# Patient Record
Sex: Male | Born: 1945
Health system: Southern US, Community
[De-identification: ages and names within clinical notes are randomized; demographics above are authoritative.]

## PROBLEM LIST (undated history)

## (undated) DIAGNOSIS — R350 Frequency of micturition: Secondary | ICD-10-CM

## (undated) DIAGNOSIS — M1990 Unspecified osteoarthritis, unspecified site: Secondary | ICD-10-CM

## (undated) DIAGNOSIS — R479 Unspecified speech disturbances: Secondary | ICD-10-CM

## (undated) DIAGNOSIS — R569 Unspecified convulsions: Secondary | ICD-10-CM

## (undated) DIAGNOSIS — C801 Malignant (primary) neoplasm, unspecified: Secondary | ICD-10-CM

## (undated) DIAGNOSIS — I10 Essential (primary) hypertension: Secondary | ICD-10-CM

## (undated) HISTORY — PX: BRAIN SURGERY: SHX531

## (undated) NOTE — *Deleted (*Deleted)
[facility] CONSULT NOTE  Patient Care Team: [name], MD as PCP - General (Family Medicine)  CHIEF COMPLAINTS/PURPOSE OF CONSULTATION:  ***  ASSESSMENT & PLAN:  No problem-specific Assessment & Plan notes found for this encounter.  No orders of the defined types were placed in this encounter.    HISTORY OF PRESENTING ILLNESS:   Joseph Rangel 41 y.o. male is here because of lymphadenopathy noted during CT angio abdomen. Patient had history of prostate cancer..... and has PAD and had some imaging recently which demonstrated scattered prominent irregular appearing intra pelvic,inguinal, and periaortic lymph nodes, the largest about the left iliac measuring approximately 1.7 cm in short axis.  He had radical prostatectomy in 2013, and this showed prostatic adenocarcinoma, Gleason 3+4, involving both lobes, no evidence of ALVI, extraprostatic extension or seminal vesicle involvement. All resection margins are clear, All LN negative. No evidence of malignancy in perivesical tissue.  REVIEW OF SYSTEMS:   Constitutional: Denies fevers, chills or abnormal night sweats Eyes: Denies blurriness of vision, double vision or watery eyes Ears, nose, mouth, throat, and face: Denies mucositis or sore throat Respiratory: Denies cough, dyspnea or wheezes Cardiovascular: Denies palpitation, chest discomfort or lower extremity swelling Gastrointestinal:  Denies nausea, heartburn or change in bowel habits Skin: Denies abnormal skin rashes Lymphatics: Denies new lymphadenopathy or easy bruising Neurological:Denies numbness, tingling or new weaknesses Behavioral/Psych: Mood is stable, no new changes  All other systems were reviewed with the patient and are negative.  MEDICAL HISTORY:  Past Medical History:  Diagnosis Date  . Arthritis   . Cancer    prostate   . Frequency of urination   . Hypertension   . Seizures    after brain surg - none in past 20 yrs  . 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 BRAIN SURGERY     BLOOD CLOT REMOVED 1980  . MASS EXCISION  02/24/2012   Procedure: MINOR EXCISION OF MASS;  Surgeon: Valetta Fuller, MD;  Location: [facility];  Service: Urology;;  excision of neck lesion  . ROBOT ASSISTED LAPAROSCOPIC RADICAL PROSTATECTOMY  02/24/2012   Procedure: ROBOTIC ASSISTED LAPAROSCOPIC RADICAL PROSTATECTOMY;  Surgeon: Valetta Fuller, MD;  Location: [facility];  Service: Urology;  Laterality: N/A;           SOCIAL HISTORY: Social History   Socioeconomic History  . Marital status: Married    Spouse name: Not on file  . Number of children: Not on file  . Years of education: Not on file  . Highest education level: Not on file  Occupational History  . Not on file  Tobacco Use  . Smoking status: Current Every Day Smoker    Packs/day: 0.50  Substance and Sexual Activity  . Alcohol use: No  . Drug use: No  . Sexual activity: Not on file  Other Topics Concern  . Not on file  Social History Narrative  . Not on file   Social Determinants of Health   Financial Resource Strain:   . Difficulty of Paying Living Expenses: Not on file  Food Insecurity:   . Worried About Programme researcher, broadcasting/film/video in the Last Year: Not on file  . Ran Out of Food in the Last Year: Not on file  Transportation Needs:   . Lack of Transportation (Medical): Not on file  . Lack of Transportation (Non-Medical): Not on file  Physical  Activity:   . Days of Exercise per Week: Not on file  . Minutes of Exercise per Session: Not on file  Stress:   . Feeling of Stress : Not on file  Social Connections:   . Frequency of Communication with Friends and Family: Not on file  . Frequency of Social Gatherings with Friends and Family: Not on file  . Attends Religious Services: Not on file  . Active Member of Clubs or Organizations: Not on file  . Attends Banker Meetings: Not on file  . Marital  Status: Not on file  Intimate Partner Violence:   . Fear of Current or Ex-Partner: Not on file  . Emotionally Abused: Not on file  . Physically Abused: Not on file  . Sexually Abused: Not on file    FAMILY HISTORY: No family history on file.  ALLERGIES:  has No Known Allergies.  MEDICATIONS:  Current Outpatient Medications  Medication Sig Dispense Refill  . phenytoin (DILANTIN) 100 MG ER capsule Take 100-200 mg by mouth 2 (two) times daily. Takes 2 every morning and 1 at bedtime    . verapamil (CALAN) 120 MG tablet Take 120 mg by mouth daily with breakfast.     No current facility-administered medications for this visit.     PHYSICAL EXAMINATION: ECOG PERFORMANCE STATUS: {CHL ONC ECOG [phone]}  There were no vitals filed for this visit. There were no vitals filed for this visit.  GENERAL:alert, no distress and comfortable SKIN: skin color, texture, turgor are normal, no rashes or significant lesions EYES: normal, conjunctiva are pink and non-injected, sclera clear OROPHARYNX:no exudate, no erythema and lips, buccal mucosa, and tongue normal  NECK: supple, thyroid normal size, non-tender, without nodularity LYMPH:  no palpable lymphadenopathy in the cervical, axillary or inguinal LUNGS: clear to auscultation and percussion with normal breathing effort HEART: regular rate & rhythm and no murmurs and no lower extremity edema ABDOMEN:abdomen soft, non-tender and normal bowel sounds Musculoskeletal:no cyanosis of digits and no clubbing  PSYCH: alert & oriented x 3 with fluent speech NEURO: no focal motor/sensory deficits  LABORATORY DATA:  I have reviewed the data as listed Lab Results  Component Value Date   WBC 9.0 02/18/2012   HGB 11.6 (L) 02/25/2012   HCT 34.9 (L) 02/25/2012   MCV 88.8 02/18/2012   PLT 224 02/18/2012     Chemistry      Component Value Date/Time   NA 132 (L) 02/25/2012 0447   K 3.7 02/25/2012 0447   CL 101 02/25/2012 0447   CO2 24  02/25/2012 0447   BUN 8 02/25/2012 0447   CREATININE 0.80 02/25/2012 0447      Component Value Date/Time   CALCIUM 7.9 (L) 02/25/2012 0447       RADIOGRAPHIC STUDIES: I have personally reviewed the radiological images as listed and agreed with the findings in the report. CT ANGIO AO+BIFEM W & OR WO CONTRAST  Result Date: 07/11/2020 CLINICAL DATA:  50 year old male with history of abnormal ankle brachial indices and left leg pain. EXAM: CT ANGIOGRAPHY OF ABDOMINAL AORTA WITH ILIOFEMORAL RUNOFF TECHNIQUE: Multidetector CT imaging of the abdomen, pelvis and lower extremities was performed using the standard protocol during bolus administration of intravenous contrast. Multiplanar CT image reconstructions and MIPs were obtained to evaluate the vascular anatomy. CONTRAST:  ISOVUE-370 IOPAMIDOL (ISOVUE-370) INJECTION 76% COMPARISON:  CT abdomen pelvis from 12/07/2013 FINDINGS: VASCULAR Aorta: Normal caliber and patent throughout. Scattered fibrofatty and calcific atherosclerotic changes, most prominent near the bifurcation without  evidence of flow limiting stenosis. Celiac: Patent without evidence of aneurysm, dissection, vasculitis or significant stenosis. SMA: Patent without evidence of aneurysm, dissection, vasculitis or significant stenosis. Of note there is a replaced right hepatic artery arising from the proximal SMA. Renals: Single bilateral renal arteries are patent without evidence of aneurysm, dissection, vasculitis, fibromuscular dysplasia or significant stenosis. IMA: Patent with mild proximal stenosis. RIGHT Lower Extremity Inflow: Common, internal and external iliac arteries are patent without evidence of aneurysm, dissection, vasculitis or significant stenosis. Scattered fibrofatty and calcific atherosclerotic changes. Outflow: Common, superficial and profunda femoral arteries and the popliteal artery are patent without evidence of aneurysm, dissection, vasculitis or significant  stenosis. Multifocal fibrofatty and calcific atherosclerotic changes with minimal multifocal luminal in right ocular disease, not likely to be hemodynamically significant. Runoff: The anterior tibial artery is patent the level of the foot. The TP trunk and posterior tibial artery are occluded throughout. The peroneal artery is diminutive but patent to the level of the ankle where there is a supra calcaneal collateral to the posterior tibial artery which then supplies the plantar arch. LEFT Lower Extremity Inflow: Common, internal and external iliac arteries are patent without evidence of aneurysm, dissection, vasculitis or significant stenosis. Scattered fibrofatty and calcific atherosclerotic changes Outflow: Common, superficial and profunda femoral arteries and the popliteal artery are patent without evidence of aneurysm, dissection, vasculitis or significant stenosis. Scattered fibrofatty and calcific atherosclerotic changes. Runoff: The anterior tibial artery is patent throughout. The TP trunk, proximal peroneal, and proximal posterior tibial arteries are diminutive proximally. The posterior tibial artery tapers to occlusion in the distal lower leg. The peroneal artery is patent to the level of the ankle. Veins: No obvious venous abnormality within the limitations of this arterial phase study. Review of the MIP images confirms the above findings. NON-VASCULAR Lower chest: The lung bases are clear bilaterally. The heart is normal in size. No pericardial effusion. Hepatobiliary: The liver is normal in size, contour, and attenuation. No focal lesions. The gallbladder is present and unremarkable. No intra or extrahepatic biliary ductal dilation. Pancreas: Unremarkable. No pancreatic ductal dilatation or surrounding inflammatory changes. Spleen: Normal in size without focal abnormality. Adrenals/Urinary Tract: Adrenal glands are unremarkable. Kidneys are normal, without renal calculi, focal lesion, or hydronephrosis.  Bladder is unremarkable. Stomach/Bowel: Stomach is within normal limits. Appendix appears normal. There are few scattered colonic diverticula. No evidence of bowel wall thickening, distention, or inflammatory changes. Lymphatic: Scattered prominent irregular appearing intra pelvic, inguinal, and periaortic lymph nodes, the largest about the left iliac measuring approximately 1.7 cm in short axis. Reproductive: Status post prostatectomy. Other: No abdominal wall hernia or abnormality. No abdominopelvic ascites. Musculoskeletal: Multilevel degenerative changes of the visualized thoracolumbar spine. IMPRESSION: VASCULAR 1. Two vessel bilateral lower extremity runoff configuration. Occlusion of the bilateral posterior tibial arteries with diminutive bilateral peroneal arteries. 2. Scattered fibrofatty and calcific atherosclerotic changes in the distal aorta, bilateral inflow, and bilateral outflow vessels without flow-limiting stenosis. Aortic Atherosclerosis (ICD10-I70.0). NON-VASCULAR Peri aortic, left iliac, and perirectal lymphadenopathy concerning for metastatic disease given history of prostate cancer. These results will be called to the ordering clinician or representative by the Radiologist Assistant, and communication documented in the PACS or [employer]. Marliss Coots, MD [facility] Electronically Signed   By: Marliss Coots MD   On: 07/11/2020 12:31    All questions were answered. The patient knows to call the clinic with any problems, questions or concerns. I spent {CHL ONC TIME VISIT -  ZOXWR:[id number]} counseling the patient face to face. The total time spent in the appointment was {CHL ONC TIME VISIT - BJYNW:[id number]} and more than 50% was on counseling.     Rachel Moulds, MD 07/31/2020 9:36 AM

---

## 1999-02-01 ENCOUNTER — Emergency Department (HOSPITAL_COMMUNITY): Admission: EM | Admit: 1999-02-01 | Discharge: 1999-02-01 | Payer: Self-pay | Admitting: Emergency Medicine

## 1999-02-02 ENCOUNTER — Encounter: Payer: Self-pay | Admitting: Emergency Medicine

## 1999-07-03 ENCOUNTER — Other Ambulatory Visit: Admission: RE | Admit: 1999-07-03 | Discharge: 1999-07-03 | Payer: Self-pay | Admitting: Family Medicine

## 1999-09-18 ENCOUNTER — Encounter (INDEPENDENT_AMBULATORY_CARE_PROVIDER_SITE_OTHER): Payer: Self-pay | Admitting: *Deleted

## 1999-09-18 ENCOUNTER — Ambulatory Visit (HOSPITAL_COMMUNITY): Admission: RE | Admit: 1999-09-18 | Discharge: 1999-09-18 | Payer: Self-pay | Admitting: *Deleted

## 2002-07-10 ENCOUNTER — Encounter (INDEPENDENT_AMBULATORY_CARE_PROVIDER_SITE_OTHER): Payer: Self-pay | Admitting: *Deleted

## 2002-07-10 ENCOUNTER — Ambulatory Visit (HOSPITAL_BASED_OUTPATIENT_CLINIC_OR_DEPARTMENT_OTHER): Admission: RE | Admit: 2002-07-10 | Discharge: 2002-07-10 | Payer: Self-pay | Admitting: General Surgery

## 2002-07-10 ENCOUNTER — Encounter: Admission: RE | Admit: 2002-07-10 | Discharge: 2002-07-10 | Payer: Self-pay | Admitting: General Surgery

## 2002-07-10 ENCOUNTER — Encounter: Payer: Self-pay | Admitting: General Surgery

## 2005-02-02 ENCOUNTER — Emergency Department (HOSPITAL_COMMUNITY): Admission: EM | Admit: 2005-02-02 | Discharge: 2005-02-02 | Payer: Self-pay | Admitting: Emergency Medicine

## 2009-07-31 ENCOUNTER — Encounter: Admission: RE | Admit: 2009-07-31 | Discharge: 2009-07-31 | Payer: Self-pay | Admitting: Family Medicine

## 2009-08-29 ENCOUNTER — Encounter: Admission: RE | Admit: 2009-08-29 | Discharge: 2009-08-29 | Payer: Self-pay | Admitting: Orthopedic Surgery

## 2009-09-03 ENCOUNTER — Emergency Department (HOSPITAL_COMMUNITY): Admission: EM | Admit: 2009-09-03 | Discharge: 2009-09-03 | Payer: Self-pay | Admitting: Emergency Medicine

## 2011-02-06 NOTE — Op Note (Signed)
Joseph Rangel, Joseph Rangel                            ACCOUNT NO.:  [id number]   MEDICAL RECORD NO.:  [id number]                   PATIENT TYPE:  AMB   LOCATION:  [facility]                                  FACILITY:  [facility]   PHYSICIAN:  Anselm Pancoast. Zachery Dakins, M.D.          DATE OF BIRTH:  02/19/1945   DATE OF PROCEDURE:  07/10/2002  DATE OF DISCHARGE:                                 OPERATIVE REPORT   PREOPERATIVE DIAGNOSIS:  Internal and external hemorrhoids, bleeding and  prolapsing.   OPERATION:  Internal and external hemorrhoidectomy, complex, three  quadrants.   SURGEON:  Anselm Pancoast. Zachery Dakins, M.D.   ANESTHESIA:  General anesthesia in the lithotomy position.   HISTORY:  The patient is a 65 year-old male who was referred by Dr. Lupe Carney to our office for bad problems with hemorrhoids.  He has had  prolapsing and bleeding for several years.  He had a colonoscopy about two  years ago with no other abnormalities noted with the exception of his  hemorrhoids.  He is disabled because of CNS trauma with a seizure disorder.  The patient on exam has large prolapsing hemorrhoids in both quadrants on  the right and large internal hemorrhoids on the left and I recommended that  a formal internal and external hemorrhoidectomy be performed.  He supposedly  had a bottle of magnesium citrate in prep even though after we had  anesthetized and placed him in the lithotomy position, it appears that he  still has firm stool within his rectum.   PROCEDURE:  I prepped the perianal area and the rectum with Betadine  solution and draped him in a sterile manner. The sphincter area was first  infiltrated all total of about 20 cc of 1/2% Marcaine with adrenalin.  Then  after he was draped in a sterile manner using the bullet retractor, the  largest hemorrhoid is the right posterolateral, the next is the left and  then there is a nearly as large up in the right anterior.  I elected to do  the right anterior  first elevating the hemorrhoid on the pedicle, exposing  the internal sphincter and then elevating this and under clamping the  internal portion with a Buie clamp.  The little secondary bleeders were  sutured with 3-0 chromic interrupted and then the pedicle.  I first sutured  under the clamp with three U stitches with a 2-0 chromic and then starting  at the highest area ligated the superior hemorrhoidal and then sutured with  a running continuous locking suture with the clamp removed. The second  hemorrhoid was done next; this was the right posterolateral.  The clamp  really did not clamp it completely and the superior pedicle slipped out, but  I sutured it basically opened directly with good hemostasis and then the  external portion of the hemorrhoid closed also in a similar manner with a 2-  0 chromic.  Next, the third lateral hemorrhoid, and it was actually two  hemorrhoids on a pedicle that I ligated both together.  I then switched from  the Buie to the little Sims retractor for exposure as the external portion  was closed.  In reinspected all suture lines with the small anoscope and  they all appeared to be hemostatic. Xylocaine ointment, gauze and 4 x 4  opened up was placed in the anal canal for postoperative pain and a little  packing.  This dressing which was three fourths of the sponges on the  outside was held in place with some 4 x 4's, an ABD with a little stretch  panty.  The patient tolerated the  procedure nicely, was awakened and sent  to the recovery room in a stable postoperative condition. He will be  released after a short stay in the recovery room with instructions not to do  anything strenuous for the next few days and Tylox for pain.  The estimated  blood loss was minimal.  The patient's hemoglobin was quite high, normal  preoperatively.                                              Anselm Pancoast. Zachery Dakins, M.D.   WJW/MEDQ  D:  07/10/2002  T:  07/10/2002  Job:   [id number]

## 2012-01-15 ENCOUNTER — Other Ambulatory Visit: Payer: Self-pay | Admitting: Urology

## 2012-01-15 DIAGNOSIS — C61 Malignant neoplasm of prostate: Secondary | ICD-10-CM

## 2012-01-26 ENCOUNTER — Other Ambulatory Visit: Payer: Self-pay | Admitting: Urology

## 2012-01-28 ENCOUNTER — Encounter (HOSPITAL_COMMUNITY)
Admission: RE | Admit: 2012-01-28 | Discharge: 2012-01-28 | Disposition: A | Discharge status: Home / Self Care | Payer: Medicare Other | Source: Ambulatory Visit | Attending: Urology | Admitting: Urology

## 2012-01-28 ENCOUNTER — Encounter (HOSPITAL_COMMUNITY): Payer: Self-pay

## 2012-01-28 DIAGNOSIS — C61 Malignant neoplasm of prostate: Secondary | ICD-10-CM | POA: Insufficient documentation

## 2012-01-28 MED ORDER — FLUDEOXYGLUCOSE F - 18 (FDG) INJECTION
17.4000 | Freq: Once | INTRAVENOUS | Status: AC | PRN
  Administered 2012-01-28: 17.4 via INTRAVENOUS

## 2012-02-12 ENCOUNTER — Encounter (HOSPITAL_COMMUNITY): Payer: Self-pay | Admitting: Pharmacy Technician

## 2012-02-18 ENCOUNTER — Ambulatory Visit (HOSPITAL_COMMUNITY)
Admission: RE | Admit: 2012-02-18 | Discharge: 2012-02-18 | Disposition: A | Discharge status: Home / Self Care | Payer: Medicare Other | Source: Ambulatory Visit | Attending: Urology | Admitting: Urology

## 2012-02-18 ENCOUNTER — Encounter (HOSPITAL_COMMUNITY)
Admission: RE | Admit: 2012-02-18 | Discharge: 2012-02-18 | Disposition: A | Discharge status: Home / Self Care | Payer: Medicare Other | Source: Ambulatory Visit | Attending: Urology | Admitting: Urology

## 2012-02-18 ENCOUNTER — Encounter (HOSPITAL_COMMUNITY): Payer: Self-pay

## 2012-02-18 DIAGNOSIS — Z01818 Encounter for other preprocedural examination: Secondary | ICD-10-CM | POA: Insufficient documentation

## 2012-02-18 DIAGNOSIS — C61 Malignant neoplasm of prostate: Secondary | ICD-10-CM | POA: Insufficient documentation

## 2012-02-18 DIAGNOSIS — Z01812 Encounter for preprocedural laboratory examination: Secondary | ICD-10-CM | POA: Insufficient documentation

## 2012-02-18 HISTORY — DX: Essential (primary) hypertension: I10

## 2012-02-18 HISTORY — DX: Frequency of micturition: R35.0

## 2012-02-18 HISTORY — DX: Malignant (primary) neoplasm, unspecified: C80.1

## 2012-02-18 HISTORY — DX: Unspecified speech disturbances: R47.9

## 2012-02-18 HISTORY — DX: Unspecified osteoarthritis, unspecified site: M19.90

## 2012-02-18 HISTORY — DX: Unspecified convulsions: R56.9

## 2012-02-18 LAB — CBC
MCH: 29.2 pg (ref 26.0–34.0)
MCV: 88.8 fL (ref 78.0–100.0)
Platelets: 224 10*3/uL (ref 150–400)
RBC: 4.55 MIL/uL (ref 4.22–5.81)
RDW: 14.7 % (ref 11.5–15.5)
WBC: 9 10*3/uL (ref 4.0–10.5)

## 2012-02-18 LAB — BASIC METABOLIC PANEL
Calcium: 9.4 mg/dL (ref 8.4–10.5)
Chloride: 104 mEq/L (ref 96–112)
Creatinine, Ser: 0.67 mg/dL (ref 0.50–1.35)
GFR calc Af Amer: >90 mL/min (ref >90)
Sodium: 139 mEq/L (ref 135–145)

## 2012-02-18 LAB — SURGICAL PCR SCREEN: MRSA, PCR: NEGATIVE

## 2012-02-18 NOTE — Patient Instructions (Signed)
20 Adilson CRUZE ZINGARO  02/18/2012   Your procedure is scheduled on:  02/24/12  Report to SHORT STAY DEPT  at 6:30 AM.  Call this number if you have problems the morning of surgery: [phone]   Remember:   Do not eat food or drink liquids AFTER MIDNIGHT  May have clear liquids UNTIL 6 HOURS BEFORE SURGERY  Clear liquids include soda, tea, black coffee, apple or grape juice, broth.  Take these medicines the morning of surgery with A SIP OF WATER: DILANTIN / VERAPAMIL   Do not wear jewelry, make-up or nail polish.  Do not wear lotions, powders, or perfumes.   Do not shave legs or underarms 48 hrs. before surgery (men may shave face)  Do not bring valuables to the hospital.  Contacts, dentures or bridgework may not be worn into surgery.  Leave suitcase in the car. After surgery it may be brought to your room.  For patients admitted to the hospital, checkout time is 11:00 AM the day of discharge.   Patients discharged the day of surgery will not be allowed to drive home.    Special Instructions:   Please read over the following fact sheets that you were given: MRSA  Information               SHOWER WITH BETASEPT THE NIGHT BEFORE SURGERY AND THE MORNING OF SURGERY               FOLLOW BOWEL PREP INSTRUCTION

## 2012-02-23 NOTE — H&P (Signed)
Joseph Rangel is an 67 y.o. male.   Chief Complaint: Prostate cancer. Patient is to undergo robotic-assisted laparoscopic radical retropubic prostatectomy with bilateral pelvic lymph node dissection. HPI: Joseph Rangel comes in today with his youngest daughter for an extended cancer conference. The patient has been diagnosed with intermediate risk clinical stage T1c adenocarcinoma of the prostate by Dr. Brunilda Rangel. The patient had an elevated PSA in the 7-8 range. His AUA symptom score was 18. No family history of any prostate cancer. His prostate was felt to be 2+ in size without any worrisome nodules or induration. Ultrasound revealed a 43 gram prostate. On the right side 1 out 6 biopsies was positive for a Gleason 6 cancer. On the left the patient had 6 out of 6 cores positive. Primarily a Gleason 3+4=7 cancer with core involvement of 5-80%. A bone scan has been ordered but is pending at this time. The patient's preoperative SHIM score is 4. He has had a discussion with Dr. Brunilda Rangel and is leaning towards robotic surgery. He is here to discuss that procedure in detail. He has had no intraabdominal surgery. The patient does have a tobacco use history of approximately 25-30 pack years. His exercise tolerance appears relatively good. He has had no chest pain or significant shortness of breath issues. No known coronary disease.   Past Medical History  Diagnosis Date  . Hypertension   . Seizures     after brain surg - none in past 20 yrs  . 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 Arthritis   . Cancer     prostate   . Frequency of urination     Past Surgical History  Procedure Date  . Brain surgery     BLOOD CLOT REMOVED 1980    No family history on file. Social History:  reports that he has been smoking.  He does not have any smokeless tobacco history on file. He reports that he does not drink alcohol or use illicit drugs.  Allergies: No Known Allergies  No prescriptions prior to admission    No  results found for this or any previous visit (from the past 48 hour(s)). No results found.  Review of Systems - Negative except Urinary frequency with mild urgency, erectile dysfunction, back and joint pain, dizziness.  There were no vitals taken for this visit. General appearance: alert, cooperative and no distress Neck: no adenopathy and no JVD Resp: clear to auscultation bilaterally Cardio: regular rate and rhythm GI: soft, non-tender; bowel sounds normal; no masses,  no organomegaly Male genitalia: normal, penis: no lesions or discharge. testes: no masses or tenderness. no hernias Pelvic: 1/2+ prostate without gross nodules. Extremities: extremities normal, atraumatic, no cyanosis or edema Skin: Skin color, texture, turgor normal. No rashes or lesions Neurologic: Grossly normal  Assessment/Plan The patient was counseled about the natural history of prostate cancer and the standard treatment options that are available for prostate cancer. It was explained to him how his age and life expectancy, clinical stage, Gleason score, and PSA affect his prognosis, the decision to proceed with additional staging studies, as well as how that information influences recommended treatment strategies. We discussed the roles for active surveillance, radiation therapy, surgical therapy, androgen deprivation, as well as ablative therapy options for the treatment of prostate cancer as appropriate to his individual cancer situation. We discussed the risks and benefits of these options with regard to their impact on cancer control and also in terms of potential adverse  events, complications, and impact on quiality of life particularly related to urinary, bowel, and sexual function. The patient was encouraged to ask questions throughout the discussion today and all questions were answered to his stated satisfaction. In addition, the patient was provided with and/or directed to appropriate resources and literature for  further education about prostate cancer and treatment options.   We discussed surgical therapy for prostate cancer including the different available surgical approaches. We discussed, in detail, the risks and expectations of surgery with regard to cancer control, urinary control, and erectile function as well as the expected postoperative recovery process. The risks, potential complications/adverse events of radical prostatectomy as well as alternative options were explained to the patient.   We discussed surgical therapy for prostate cancer including the different available surgical approaches. We discussed, in detail, the risks and expectations of surgery with regard to cancer control, urinary control, and erectile function as well as the expected postoperative recovery process. Additional risks of surgery including but not limited to bleeding, infection, hernia formation, nerve damage, lymphocele formation, bowel/rectal injury potentially necessitating colostomy, damage to the urinary tract resulting in urine leakage, urethral stricture, and the cardiopulmonary risks such as myocardial infarction, stroke, death, venothromboembolism, etc. were explained. The risk of open surgical conversion for robotic/laparoscopic prostatectomy was also discussed.     Joseph Rangel S 02/23/2012, 9:00 AM

## 2012-02-24 ENCOUNTER — Ambulatory Visit (HOSPITAL_COMMUNITY): Payer: Medicare Other | Admitting: Anesthesiology

## 2012-02-24 ENCOUNTER — Encounter (HOSPITAL_COMMUNITY): Payer: Self-pay | Admitting: Anesthesiology

## 2012-02-24 ENCOUNTER — Inpatient Hospital Stay (HOSPITAL_COMMUNITY)
Admission: RE | Admit: 2012-02-24 | Discharge: 2012-02-25 | DRG: 708 | Disposition: A | Discharge status: Home / Self Care | Payer: Medicare Other | Source: Ambulatory Visit | Attending: Urology | Admitting: Urology

## 2012-02-24 ENCOUNTER — Encounter (HOSPITAL_COMMUNITY)
Admission: RE | Disposition: A | Discharge status: Home / Self Care | Payer: Self-pay | Source: Ambulatory Visit | Attending: Urology

## 2012-02-24 ENCOUNTER — Encounter (HOSPITAL_COMMUNITY): Payer: Self-pay | Admitting: *Deleted

## 2012-02-24 DIAGNOSIS — I10 Essential (primary) hypertension: Secondary | ICD-10-CM | POA: Diagnosis present

## 2012-02-24 DIAGNOSIS — C61 Malignant neoplasm of prostate: Principal | ICD-10-CM | POA: Diagnosis present

## 2012-02-24 DIAGNOSIS — F17200 Nicotine dependence, unspecified, uncomplicated: Secondary | ICD-10-CM | POA: Diagnosis present

## 2012-02-24 DIAGNOSIS — M129 Arthropathy, unspecified: Secondary | ICD-10-CM | POA: Diagnosis present

## 2012-02-24 HISTORY — PX: MASS EXCISION: SHX2000

## 2012-02-24 HISTORY — PX: ROBOT ASSISTED LAPAROSCOPIC RADICAL PROSTATECTOMY: SHX5141

## 2012-02-24 LAB — BASIC METABOLIC PANEL
BUN: 10 mg/dL (ref 6–23)
CO2: 24 mEq/L (ref 19–32)
Calcium: 8.2 mg/dL — ABNORMAL LOW (ref 8.4–10.5)
Glucose, Bld: 148 mg/dL — ABNORMAL HIGH (ref 70–99)
Sodium: 134 mEq/L — ABNORMAL LOW (ref 135–145)

## 2012-02-24 LAB — HEMOGLOBIN AND HEMATOCRIT, BLOOD: HCT: 37.5 % — ABNORMAL LOW (ref 39.0–52.0)

## 2012-02-24 LAB — ABO/RH: ABO/RH(D): O POS

## 2012-02-24 LAB — TYPE AND SCREEN

## 2012-02-24 SURGERY — ROBOTIC ASSISTED LAPAROSCOPIC RADICAL PROSTATECTOMY
Anesthesia: General | Site: Pelvis | Wound class: Clean Contaminated

## 2012-02-24 MED ORDER — CIPROFLOXACIN HCL 500 MG PO TABS: 500.0000 mg | ORAL_TABLET | Freq: Two times a day (BID) | ORAL | Status: AC

## 2012-02-24 MED ORDER — ACETAMINOPHEN 10 MG/ML IV SOLN
1000.0000 mg | Freq: Four times a day (QID) | INTRAVENOUS | Status: AC
  Administered 2012-02-24 – 2012-02-25 (×4): 1000 mg via INTRAVENOUS
  Filled 2012-02-24 (×4): qty 100

## 2012-02-24 MED ORDER — VERAPAMIL HCL 120 MG PO TABS
120.0000 mg | ORAL_TABLET | Freq: Every day | ORAL | Status: DC
  Administered 2012-02-25: 120 mg via ORAL
  Filled 2012-02-24: qty 1

## 2012-02-24 MED ORDER — SODIUM CHLORIDE 0.9 % IR SOLN
Status: DC | PRN
  Administered 2012-02-24: 200 mL

## 2012-02-24 MED ORDER — NEOSTIGMINE METHYLSULFATE 1 MG/ML IJ SOLN
INTRAMUSCULAR | Status: DC | PRN
  Administered 2012-02-24: 3 mg via INTRAVENOUS

## 2012-02-24 MED ORDER — PHENYTOIN SODIUM EXTENDED 100 MG PO CAPS: 100.0000 mg | ORAL_CAPSULE | Freq: Two times a day (BID) | ORAL | Status: DC

## 2012-02-24 MED ORDER — BACITRACIN ZINC 500 UNIT/GM EX OINT
TOPICAL_OINTMENT | CUTANEOUS | Status: AC
  Filled 2012-02-24: qty 15

## 2012-02-24 MED ORDER — CEFAZOLIN SODIUM 1-5 GM-% IV SOLN
INTRAVENOUS | Status: AC
  Filled 2012-02-24: qty 50

## 2012-02-24 MED ORDER — CEFAZOLIN SODIUM 1-5 GM-% IV SOLN
1.0000 g | Freq: Three times a day (TID) | INTRAVENOUS | Status: AC
  Administered 2012-02-24 – 2012-02-25 (×2): 1 g via INTRAVENOUS
  Filled 2012-02-24 (×2): qty 50

## 2012-02-24 MED ORDER — PROPOFOL 10 MG/ML IV EMUL
INTRAVENOUS | Status: DC | PRN
  Administered 2012-02-24: 150 mg via INTRAVENOUS

## 2012-02-24 MED ORDER — ONDANSETRON HCL 4 MG/2ML IJ SOLN
INTRAMUSCULAR | Status: DC | PRN
  Administered 2012-02-24: 4 mg via INTRAVENOUS

## 2012-02-24 MED ORDER — LACTATED RINGERS IV SOLN
INTRAVENOUS | Status: DC | PRN
  Administered 2012-02-24: 10:00:00

## 2012-02-24 MED ORDER — PHENYTOIN SODIUM EXTENDED 100 MG PO CAPS
100.0000 mg | ORAL_CAPSULE | Freq: Every day | ORAL | Status: DC
  Administered 2012-02-24: 100 mg via ORAL
  Filled 2012-02-24 (×2): qty 1

## 2012-02-24 MED ORDER — KCL IN DEXTROSE-NACL 10-5-0.45 MEQ/L-%-% IV SOLN
INTRAVENOUS | Status: AC
  Filled 2012-02-24: qty 1000

## 2012-02-24 MED ORDER — CEFAZOLIN SODIUM 1-5 GM-% IV SOLN
1.0000 g | INTRAVENOUS | Status: AC
  Administered 2012-02-24: 1 g via INTRAVENOUS

## 2012-02-24 MED ORDER — ROCURONIUM BROMIDE 100 MG/10ML IV SOLN
INTRAVENOUS | Status: DC | PRN
  Administered 2012-02-24: 10 mg via INTRAVENOUS
  Administered 2012-02-24: 20 mg via INTRAVENOUS
  Administered 2012-02-24: 50 mg via INTRAVENOUS
  Administered 2012-02-24 (×2): 10 mg via INTRAVENOUS
  Administered 2012-02-24: 20 mg via INTRAVENOUS
  Administered 2012-02-24: 10 mg via INTRAVENOUS
  Administered 2012-02-24: 20 mg via INTRAVENOUS

## 2012-02-24 MED ORDER — BACITRACIN ZINC 500 UNIT/GM EX OINT
TOPICAL_OINTMENT | CUTANEOUS | Status: DC | PRN
  Administered 2012-02-24: 1 via TOPICAL

## 2012-02-24 MED ORDER — HEMOSTATIC AGENTS (NO CHARGE) OPTIME
TOPICAL | Status: DC | PRN
  Administered 2012-02-24: 1 via TOPICAL

## 2012-02-24 MED ORDER — LABETALOL HCL 5 MG/ML IV SOLN
5.0000 mg | INTRAVENOUS | Status: DC | PRN
Start: 2012-02-24 — End: 2012-02-24
  Administered 2012-02-24 (×2): 5 mg via INTRAVENOUS

## 2012-02-24 MED ORDER — HYDROCODONE-ACETAMINOPHEN 5-325 MG PO TABS: 1.0000 | ORAL_TABLET | ORAL | Status: DC | PRN

## 2012-02-24 MED ORDER — ACETAMINOPHEN 10 MG/ML IV SOLN
INTRAVENOUS | Status: DC | PRN
  Administered 2012-02-24: 1000 mg via INTRAVENOUS

## 2012-02-24 MED ORDER — BUPIVACAINE-EPINEPHRINE 0.25% -1:200000 IJ SOLN
INTRAMUSCULAR | Status: DC | PRN
  Administered 2012-02-24: 27 mL

## 2012-02-24 MED ORDER — HYDROMORPHONE HCL PF 1 MG/ML IJ SOLN
INTRAMUSCULAR | Status: DC | PRN
  Administered 2012-02-24 (×2): 1 mg via INTRAVENOUS

## 2012-02-24 MED ORDER — LACTATED RINGERS IV SOLN
INTRAVENOUS | Status: DC | PRN
  Administered 2012-02-24 (×3): via INTRAVENOUS

## 2012-02-24 MED ORDER — KCL IN DEXTROSE-NACL 10-5-0.45 MEQ/L-%-% IV SOLN
INTRAVENOUS | Status: DC
  Administered 2012-02-24: 21:00:00 via INTRAVENOUS
  Administered 2012-02-24: 1000 mL via INTRAVENOUS
  Administered 2012-02-25: 07:00:00 via INTRAVENOUS
  Filled 2012-02-24 (×5): qty 1000

## 2012-02-24 MED ORDER — HYDROMORPHONE HCL PF 1 MG/ML IJ SOLN
INTRAMUSCULAR | Status: AC
  Filled 2012-02-24: qty 1

## 2012-02-24 MED ORDER — INDIGOTINDISULFONATE SODIUM 8 MG/ML IJ SOLN
INTRAMUSCULAR | Status: DC | PRN
  Administered 2012-02-24 (×2): 5 mL via INTRAVENOUS

## 2012-02-24 MED ORDER — FENTANYL CITRATE 0.05 MG/ML IJ SOLN
INTRAMUSCULAR | Status: DC | PRN
  Administered 2012-02-24 (×2): 100 ug via INTRAVENOUS
  Administered 2012-02-24: 50 ug via INTRAVENOUS
  Administered 2012-02-24: 100 ug via INTRAVENOUS

## 2012-02-24 MED ORDER — MIDAZOLAM HCL 5 MG/5ML IJ SOLN
INTRAMUSCULAR | Status: DC | PRN
  Administered 2012-02-24: 2 mg via INTRAVENOUS

## 2012-02-24 MED ORDER — METOPROLOL TARTRATE 1 MG/ML IV SOLN
5.0000 mg | Freq: Once | INTRAVENOUS | Status: AC
  Administered 2012-02-24: 5 mg via INTRAVENOUS
  Filled 2012-02-24: qty 5

## 2012-02-24 MED ORDER — INDIGOTINDISULFONATE SODIUM 8 MG/ML IJ SOLN
INTRAMUSCULAR | Status: AC
  Filled 2012-02-24: qty 10

## 2012-02-24 MED ORDER — HYDROCODONE-ACETAMINOPHEN 5-325 MG PO TABS: 1.0000 | ORAL_TABLET | Freq: Four times a day (QID) | ORAL | Status: AC | PRN

## 2012-02-24 MED ORDER — HYDROMORPHONE HCL PF 1 MG/ML IJ SOLN
0.2500 mg | INTRAMUSCULAR | Status: DC | PRN
  Administered 2012-02-24 (×4): 0.5 mg via INTRAVENOUS

## 2012-02-24 MED ORDER — MORPHINE SULFATE 2 MG/ML IJ SOLN
2.0000 mg | INTRAMUSCULAR | Status: DC | PRN
  Administered 2012-02-24 (×3): 2 mg via INTRAVENOUS
  Filled 2012-02-24 (×3): qty 1

## 2012-02-24 MED ORDER — BUPIVACAINE-EPINEPHRINE 0.25% -1:200000 IJ SOLN
INTRAMUSCULAR | Status: AC
  Filled 2012-02-24: qty 1

## 2012-02-24 MED ORDER — GLYCOPYRROLATE 0.2 MG/ML IJ SOLN
INTRAMUSCULAR | Status: DC | PRN
  Administered 2012-02-24: 0.4 mg via INTRAVENOUS

## 2012-02-24 MED ORDER — HEPARIN SODIUM (PORCINE) 1000 UNIT/ML IJ SOLN
INTRAMUSCULAR | Status: AC
  Filled 2012-02-24: qty 1

## 2012-02-24 MED ORDER — PNEUMOCOCCAL VAC POLYVALENT 25 MCG/0.5ML IJ INJ
0.5000 mL | INJECTION | INTRAMUSCULAR | Status: AC
  Administered 2012-02-25: 0.5 mL via INTRAMUSCULAR
  Filled 2012-02-24: qty 0.5

## 2012-02-24 MED ORDER — SODIUM CHLORIDE 0.9 % IV BOLUS (SEPSIS)
1000.0000 mL | Freq: Once | INTRAVENOUS | Status: AC
  Administered 2012-02-24: 1000 mL via INTRAVENOUS

## 2012-02-24 MED ORDER — LACTATED RINGERS IV SOLN: INTRAVENOUS | Status: DC

## 2012-02-24 MED ORDER — PHENYLEPHRINE HCL 10 MG/ML IJ SOLN
INTRAMUSCULAR | Status: DC | PRN
  Administered 2012-02-24 (×2): 100 ug via INTRAVENOUS
  Administered 2012-02-24 (×2): 50 ug via INTRAVENOUS

## 2012-02-24 MED ORDER — LIDOCAINE HCL (CARDIAC) 20 MG/ML IV SOLN
INTRAVENOUS | Status: DC | PRN
  Administered 2012-02-24: 60 mg via INTRAVENOUS

## 2012-02-24 MED ORDER — ACETAMINOPHEN 10 MG/ML IV SOLN
INTRAVENOUS | Status: AC
  Filled 2012-02-24: qty 100

## 2012-02-24 MED ORDER — PHENYTOIN SODIUM EXTENDED 100 MG PO CAPS
200.0000 mg | ORAL_CAPSULE | Freq: Every day | ORAL | Status: DC
  Administered 2012-02-25: 200 mg via ORAL
  Filled 2012-02-24: qty 2

## 2012-02-24 MED ORDER — LABETALOL HCL 5 MG/ML IV SOLN
INTRAVENOUS | Status: AC
  Filled 2012-02-24: qty 4

## 2012-02-24 SURGICAL SUPPLY — 52 items
APL ESCP 34 STRL LF DISP (HEMOSTASIS) ×3
APPLICATOR SURGIFLO ENDO (HEMOSTASIS) ×1 IMPLANT
CANISTER SUCTION 2500CC (MISCELLANEOUS) ×4 IMPLANT
CANNULA SEAL DVNC (CANNULA) IMPLANT
CANNULA SEALS DA VINCI (CANNULA) ×3
CATH FOLEY 2WAY SLVR  5CC 20FR (CATHETERS) ×1
CATH FOLEY 2WAY SLVR 5CC 20FR (CATHETERS) ×3 IMPLANT
CATH ROBINSON RED A/P 8FR (CATHETERS) ×4 IMPLANT
CHLORAPREP W/TINT 26ML (MISCELLANEOUS) ×4 IMPLANT
CLIP LIGATING HEM O LOK PURPLE (MISCELLANEOUS) ×16 IMPLANT
CLOTH BEACON ORANGE TIMEOUT ST (SAFETY) ×4 IMPLANT
CORD HIGH FREQUENCY UNIPOLAR (ELECTROSURGICAL) ×4 IMPLANT
CORDS BIPOLAR (ELECTRODE) ×1 IMPLANT
COVER SURGICAL LIGHT HANDLE (MISCELLANEOUS) ×4 IMPLANT
COVER TIP SHEARS 8 DVNC (MISCELLANEOUS) ×3 IMPLANT
COVER TIP SHEARS 8MM DA VINCI (MISCELLANEOUS) ×1
CUTTER ECHEON FLEX ENDO 45 340 (ENDOMECHANICALS) ×4 IMPLANT
DECANTER SPIKE VIAL GLASS SM (MISCELLANEOUS) ×4 IMPLANT
DRAPE CAMERA ARM DAVINCI SIROB (DRAPES) ×1 IMPLANT
DRAPE CAMERA HEAD DAVINCI SI (DRAPES) ×1 IMPLANT
DRAPE INSTRUMENT ARM DA VINCI (DRAPES) ×1
DRAPE INSTRUMENT ARM DVNC (DRAPES) IMPLANT
DRAPE SURG IRRIG POUCH 19X23 (DRAPES) ×4 IMPLANT
DRAPE UTILITY 15X26 (DRAPE) ×3 IMPLANT
DRSG TEGADERM 6X8 (GAUZE/BANDAGES/DRESSINGS) ×11 IMPLANT
ELECT REM PT RETURN 9FT ADLT (ELECTROSURGICAL) ×4
ELECTRODE REM PT RTRN 9FT ADLT (ELECTROSURGICAL) ×3 IMPLANT
FLOSEAL 10ML (HEMOSTASIS) ×1 IMPLANT
GLOVE BIO SURGEON STRL SZ 6.5 (GLOVE) ×8 IMPLANT
GLOVE BIOGEL M STRL SZ7.5 (GLOVE) ×4 IMPLANT
GOWN PREVENTION PLUS XLARGE (GOWN DISPOSABLE) ×4 IMPLANT
GOWN STRL NON-REIN LRG LVL3 (GOWN DISPOSABLE) ×5 IMPLANT
GOWN STRL REIN XL XLG (GOWN DISPOSABLE) ×4 IMPLANT
HEMOSTAT SURGICEL 4X8 (HEMOSTASIS) ×1 IMPLANT
HOLDER FOLEY CATH W/STRAP (MISCELLANEOUS) ×4 IMPLANT
IV LACTATED RINGERS 1000ML (IV SOLUTION) ×4 IMPLANT
KIT ACCESSORY DA VINCI DISP (KITS)
KIT ACCESSORY DVNC DISP (KITS) ×3 IMPLANT
NDL SAFETY ECLIPSE 18X1.5 (NEEDLE) ×3 IMPLANT
NEEDLE HYPO 18GX1.5 SHARP (NEEDLE) ×4
PACK ROBOT UROLOGY CUSTOM (CUSTOM PROCEDURE TRAY) ×4 IMPLANT
RELOAD GREEN ECHELON 45 (STAPLE) ×4 IMPLANT
SEALER TISSUE G2 CVD JAW 45CM (ENDOMECHANICALS) IMPLANT
SET TUBE IRRIG SUCTION NO TIP (IRRIGATION / IRRIGATOR) ×4 IMPLANT
SOLUTION ELECTROLUBE (MISCELLANEOUS) ×4 IMPLANT
SPONGE GAUZE 4X4 12PLY (GAUZE/BANDAGES/DRESSINGS) ×4 IMPLANT
SUT VIC AB 2-0 SH 27 (SUTURE) ×4
SUT VIC AB 2-0 SH 27X BRD (SUTURE) ×3 IMPLANT
SUT VICRYL 0 UR6 27IN ABS (SUTURE) ×4 IMPLANT
SYR 27GX1/2 1ML LL SAFETY (SYRINGE) ×4 IMPLANT
TOWEL OR NON WOVEN STRL DISP B (DISPOSABLE) ×4 IMPLANT
WATER STERILE IRR 1500ML POUR (IV SOLUTION) ×8 IMPLANT

## 2012-02-24 NOTE — Transfer of Care (Signed)
Immediate Anesthesia Transfer of Care Note  Patient: Joseph Rangel  Procedure(s) Performed: Procedure(s) (LRB): ROBOTIC ASSISTED LAPAROSCOPIC RADICAL PROSTATECTOMY (N/A) LYMPHADENECTOMY (Bilateral) MINOR EXCISION OF MASS ()  Patient Location: PACU  Anesthesia Type: General  Level of Consciousness: awake, alert , oriented and patient cooperative  Airway & Oxygen Therapy: Patient Spontanous Breathing and Patient connected to face mask oxygen  Post-op Assessment: Report given to PACU RN, Post -op Vital signs reviewed and stable and Patient moving all extremities X 4  Post vital signs: Reviewed and stable  Complications: No apparent anesthesia complications

## 2012-02-24 NOTE — Progress Notes (Signed)
Bowel prep done by pt as instructed

## 2012-02-24 NOTE — Op Note (Signed)
Preoperative diagnosis: Clinical stage T1c Adenocarcinoma prostate  Postoperative diagnosis: Same  Procedure: Robotic-assisted laparoscopic radical retropubic prostatectomy with bilateral pelvic lymph node dissection  Surgeon: Valetta Fuller, MD  Asst.: Pecola Leisure, PA Anesthesia: Gen. Endotracheal  Indications: Patient was diagnosed with clinical stage TIc Adenocarcinoma the prostate. He underwent extensive consultation with regard to treatment options. The patient decided on a surgical approach. He appeared to understand the distinct advantages as well as the disadvantages of this procedure. The patient has performed a mechanical bowel prep. He has had placement of PAS compression boots and has received perioperative antibiotics. The patient's preoperative PSA was 7.6. Ultrasound revealed a 43 g prostate.   Technique and findings:The patient was brought to the operating room and had successful induction of general endotracheal anesthesia.the patient was placed in a low lithotomy position with careful padding of all extremities. He was secured to the operative table and placed in the steep Trendelenburg position. He was prepped and draped in usual manner. A Foley catheter was placed sterilely on the field. Camera port site was chosen 18 cm above the pubic symphysis just to the left of the umbilicus. A standard open Hassan technique was utilized. A 12 mm trocar was placed without difficulty. The camera was then inserted and no abnormalities were noted within the pelvis. The trochars were placed with direct visual guidance. This included 3 8mm robotic trochars and a 12 mm and 5 mm assist ports. Once all the ports were placed the robot was docked. The bladder was filled and the space of Retzius was developed with electrocautery dissection as well as blunt dissection. Superficial fat off the endopelvic fascia and bladder neck was removed with electrocautery scissors. The endopelvic fascia was then incised  bilaterally from base to apex. Levator musculature was swept off the apex of the prostate isolating the dorsal venous complex which was then stapled with the ETS stapling device. The anterior bladder neck was identified with the aid of the Foley balloon. This was then transected down to the Foley catheter with electrocautery scissors. The Foley catheter was then retracted anteriorly. Indigo carmine was given and we appeared to be well away from the ureteral orifices. The posterior bladder neck was then transected and the dissection carried down to the adnexal structures. The seminal vesicles and vas deferens were then identified posteriorly. There really no good normal tissue planes in this area. The seminal vesicles and vas appeared to be encased in either an inflammatory or potentially malignant process. In order to more safely dissect out the structures I went posteriorly behind the bladder and approached the structures in the posterior manner. We were able to free up the structures which were then retracted anteriorly . The posterior plane between the rectum and prostate was then established primarily with blunt dissection.  Attention was then turned towards nerve sparing. The patient was felt to be a candidate for right-sided nerve sparing only. Superficial fascia along the anterior lateral aspect of the prostate was incised on the right. This tissue was then swept laterally until we were able to establish a groove between the neurovascular tissue and the posterior lateral aspect on the prostate. This groove was then extended from the apex back to the base of the prostate. With the prostate retracted anteriorly the vascular pedicles of the prostate were taken with the Enseal device. Left sided pedicles and NV bundles taken widely. The Foley catheter was then reinserted and the anterior urethra was transected. The posterior urethra was then transected as  were some rectourethralis fibers. The prostate was then  removed from the pelvis. The pelvis was then copiously irrigated. Rectal insufflation was performed and there was no evidence of rectal injury.  Attention was then turned towards bilateral pelvic lymph node dissection. The obturator node packets were removed I laterally and the dissection extended towards the bifurcation of the iliac artery. The obturator nerve was identified on both sides and preserved. Hemalock clips were used for small veins and lymphatic channels. The node packets were sent for permanent analysis.  Attention was then turned towards reconstruction. The bladder neck did not require any reconstruction. The bladder neck and posterior urethra were reapproximated at the 6:00 position utilizing a 2-0 Vicryl suture. The rest of the anastomosis was done with a double-armed 3-0 Monocryl suture in a 360 degree manner. Additional indigo carmine was given. A new catheter was placed and bladder irrigation revealed no evidence of leakage. A Blake drain was placed through one of the robotic trochars and positioned in the retropubic space above the anastomosis. This was then secured to the skin with a nylon suture. The prostate was placed in the Endopouch retrieval bag. The 12 mm trocar site was closed with a Vicryl suture with the aid of a suture passer. Our other trochars were taken out with direct visual guidance without evidence of any bleeding. The camera port incision was extended slightly to allow for removal of the specimen and then closed with a running Vicryl suture. All port sites were infiltrated with Marcaine and then closed with surgical clips. The patient was then taken to recovery room having had no obvious complications or problems. Sponge and needle counts were correct.

## 2012-02-24 NOTE — Preoperative (Signed)
Beta Blockers   Reason not to administer Beta Blockers:Not Applicable 

## 2012-02-24 NOTE — Anesthesia Postprocedure Evaluation (Signed)
  Anesthesia Post-op Note  Patient: Joseph Rangel  Procedure(s) Performed: Procedure(s) (LRB): ROBOTIC ASSISTED LAPAROSCOPIC RADICAL PROSTATECTOMY (N/A) LYMPHADENECTOMY (Bilateral) MINOR EXCISION OF MASS ()  Patient Location: PACU  Anesthesia Type: General  Level of Consciousness: awake and alert   Airway and Oxygen Therapy: Patient Spontanous Breathing  Post-op Pain: mild  Post-op Assessment: Post-op Vital signs reviewed, Patient's Cardiovascular Status Stable, Respiratory Function Stable, Patent Airway and No signs of Nausea or vomiting  Post-op Vital Signs: stable  Complications: No apparent anesthesia complications

## 2012-02-24 NOTE — Discharge Instructions (Signed)
1. Activity:  You are encouraged to ambulate frequently (about every hour during waking hours) to help prevent blood clots from forming in your legs or lungs.  However, you should not engage in any heavy lifting (> 10-15 lbs), strenuous activity, or straining. °2. Diet: You should continue a clear liquid diet until passing gas from below.  Once this occurs, you may advance your diet to a soft diet that would be easy to digest (i.e soups, scrambled eggs, mashed potatoes, etc.) for 24 hours just as you would if getting over a bad stomach flu.  If tolerating this diet well for 24 hours, you may then begin eating regular food.  It will be normal to have some amount of bloating, nausea, and abdominal discomfort intermittently. °3. Prescriptions:  You will be provided a prescription for pain medication to take as needed.  If your pain is not severe enough to require the prescription pain medication, you may take extra strength Tylenol instead.  You should also take an over the counter stool softener (Colace 100 mg twice daily) to avoid straining with bowel movements as the pain medication may constipate you. Finally, you will also be provided a prescription for an antibiotic to begin the day prior to your return visit in the office for catheter removal. °4. Catheter care: You will be taught how to take care of the catheter by the nursing staff prior to discharge from the hospital.  You may use both a leg bag and the larger bedside bag but it is recommended to at least use the bigger bedside bag at nighttime as the leg bag is small and will fill up overnight and also does not drain as well when lying flat. You may periodically feel a strong urge to void with the catheter in place.  This is a bladder spasm and most often can occur when having a bowel movement or when you are moving around. It is typically self-limited and usually will stop after a few minutes.  You may use some Vaseline or Neosporin around the tip of the  catheter to reduce friction at the tip of the penis. °5. Incisions: You may remove your dressing bandages the 2nd day after surgery.  You most likely will have a few small staples in each of the incisions and once the bandages are removed, the incisions may stay open to air.  You may start showering (not soaking or bathing in water) 48 hours after surgery and the incisions simply need to be patted dry after the shower.  No additional care is needed. °6. What to call us about: You should call the office ([phone]) if you develop fever > 101, persistent vomiting, or the catheter stops draining. Also, feel free to call with any other questions you may have and remember the handout that was provided to you as a reference preoperatively which answers many of the common questions that arise after surgery. ° °You may resume aspirin 7 days after surgery. °

## 2012-02-24 NOTE — Progress Notes (Signed)
Patients bp has been increased since getting to floor. BP is 190/100 and HR 98-100. Lujean Rave notified of situation. New orders for metoprolol received. Will continue to monitor patient.

## 2012-02-24 NOTE — Interval H&P Note (Signed)
History and Physical Interval Note:  02/24/2012 7:56 AM  Joseph Rangel  has presented today for surgery, with the diagnosis of PROSTATE CANCER  The various methods of treatment have been discussed with the patient and family. After consideration of risks, benefits and other options for treatment, the patient has consented to  Procedure(s) (LRB): ROBOTIC ASSISTED LAPAROSCOPIC RADICAL PROSTATECTOMY (N/A) LYMPHADENECTOMY (Bilateral) as a surgical intervention .  The patients' history has been reviewed, patient examined, no change in status, stable for surgery.  I have reviewed the patients' chart and labs.  Questions were answered to the patient's satisfaction.     [name]

## 2012-02-24 NOTE — Progress Notes (Addendum)
Day of Surgery Subjective: Patient reports pain control good. Has ambulated once.  Denies N/V. BP has been up post op  Objective: Vital signs in last 24 hours: Temp:  [96.8 F (36 C)-98.1 F (36.7 C)] 98.1 F (36.7 C) (06/05 1715) Pulse Rate:  [70-106] 84  (06/05 1715) Resp:  [7-20] 16  (06/05 1715) BP: (160-202)/(90-102) 160/90 mmHg (06/05 1715) SpO2:  [98 %-100 %] 98 % (06/05 1715) Weight:  [75.705 kg (166 lb 14.4 oz)] 75.705 kg (166 lb 14.4 oz) (06/05 1445)  Intake/Output from previous day:   Intake/Output this shift: Total I/O In: 2450 [I.V.:2400; IV Piggyback:50] Out: 1045 [Urine:525; Other:120; Blood:400]  Physical Exam:  General:alert, cooperative and no distress GI: soft SCDs in place Urine:pink   Lab Results:  Basename 02/24/12 1330  HGB 12.6*  HCT 37.5*   BMET  Basename 02/24/12 1330  NA 134*  K 3.8  CL 101  CO2 24  GLUCOSE 148*  BUN 10  CREATININE 0.76  CALCIUM 8.2*   No results found for this basename: LABPT:3,INR:3 in the last 72 hours No results found for this basename: LABURIN:1 in the last 72 hours Results for orders placed during the hospital encounter of 02/18/12  SURGICAL PCR SCREEN     Status: Normal   Collection Time   02/18/12 11:23 AM      Component Value Range Status Comment   MRSA, PCR NEGATIVE  NEGATIVE  Final    Staphylococcus aureus NEGATIVE  NEGATIVE  Final     Studies/Results: No results found.  Assessment/Plan: Day of Surgery Procedure(s) (LRB): ROBOTIC ASSISTED LAPAROSCOPIC RADICAL PROSTATECTOMY (N/A) LYMPHADENECTOMY (Bilateral) MINOR EXCISION OF MASS ()  Monitor HTN; responded to metoprolol  Continue current care   LOS: 0 days   Silas Flood. 02/24/2012, 6:09 PM

## 2012-02-24 NOTE — Anesthesia Preprocedure Evaluation (Addendum)
Anesthesia Evaluation  Patient identified by MRN, date of birth, ID band Patient awake    Reviewed: Allergy & Precautions, H&P , NPO status , Patient's Chart, lab work & pertinent test results  Airway Mallampati: II TM Distance: >3 FB Neck ROM: full    Dental  (+) Edentulous Upper and Edentulous Lower   Pulmonary neg pulmonary ROS, Current Smoker,  breath sounds clear to auscultation  Pulmonary exam normal       Cardiovascular Exercise Tolerance: Good hypertension, Pt. on medications negative cardio ROS  Rhythm:regular Rate:Normal     Neuro/Psych Speech problem from brain surgery 20 years ago. negative neurological ROS  negative psych ROS   GI/Hepatic negative GI ROS, Neg liver ROS,   Endo/Other  negative endocrine ROS  Renal/GU negative Renal ROS  negative genitourinary   Musculoskeletal   Abdominal   Peds  Hematology negative hematology ROS (+)   Anesthesia Other Findings   Reproductive/Obstetrics negative OB ROS                          Anesthesia Physical Anesthesia Plan  ASA: II  Anesthesia Plan: General   Post-op Pain Management:    Induction: Intravenous  Airway Management Planned: Oral ETT  Additional Equipment:   Intra-op Plan:   Post-operative Plan: Extubation in OR  Informed Consent: I have reviewed the patients History and Physical, chart, labs and discussed the procedure including the risks, benefits and alternatives for the proposed anesthesia with the patient or authorized representative who has indicated his/her understanding and acceptance.   Dental Advisory Given  Plan Discussed with: CRNA and Surgeon  Anesthesia Plan Comments:         Anesthesia Quick Evaluation

## 2012-02-25 ENCOUNTER — Encounter (HOSPITAL_COMMUNITY): Payer: Self-pay | Admitting: Urology

## 2012-02-25 LAB — BASIC METABOLIC PANEL
CO2: 24 mEq/L (ref 19–32)
Calcium: 7.9 mg/dL — ABNORMAL LOW (ref 8.4–10.5)
Chloride: 101 mEq/L (ref 96–112)
Glucose, Bld: 127 mg/dL — ABNORMAL HIGH (ref 70–99)
Potassium: 3.7 mEq/L (ref 3.5–5.1)
Sodium: 132 mEq/L — ABNORMAL LOW (ref 135–145)

## 2012-02-25 LAB — HEMOGLOBIN AND HEMATOCRIT, BLOOD
HCT: 34.9 % — ABNORMAL LOW (ref 39.0–52.0)
Hemoglobin: 11.6 g/dL — ABNORMAL LOW (ref 13.0–17.0)

## 2012-02-25 MED ORDER — KETOROLAC TROMETHAMINE 30 MG/ML IJ SOLN
30.0000 mg | Freq: Three times a day (TID) | INTRAMUSCULAR | Status: DC
  Administered 2012-02-25: 30 mg via INTRAVENOUS
  Filled 2012-02-25: qty 1

## 2012-02-25 MED ORDER — OXYBUTYNIN CHLORIDE 5 MG PO TABS
5.0000 mg | ORAL_TABLET | Freq: Three times a day (TID) | ORAL | Status: DC
  Administered 2012-02-25: 5 mg via ORAL
  Filled 2012-02-25 (×3): qty 1

## 2012-02-25 MED ORDER — BISACODYL 10 MG RE SUPP
10.0000 mg | Freq: Once | RECTAL | Status: AC
  Administered 2012-02-25: 10 mg via RECTAL
  Filled 2012-02-25: qty 1

## 2012-02-25 NOTE — Discharge Summary (Signed)
  Date of admission: 02/24/2012  Date of discharge: 02/25/2012  Admission diagnosis: Prostate Cancer  Discharge diagnosis: Prostate Cancer  History and Physical: For full details, please see admission history and physical. Briefly, Joseph Rangel is a 66 y.o. gentleman with localized prostate cancer.  After discussing management/treatment options, he elected to proceed with surgical treatment.  Hospital Course: JONANTHONY NAHAR was taken to the operating room on 02/24/2012 and underwent a robotic assisted laparoscopic radical prostatectomy. He tolerated this procedure well and without complications. Postoperatively, he was able to be transferred to a regular hospital room following recovery from anesthesia.  He was able to begin ambulating the night of surgery. He remained hemodynamically stable overnight.  He had excellent urine output with appropriately minimal output from his pelvic drain and his pelvic drain was removed on POD #1.  He was transitioned to oral pain medication, tolerated a clear liquid diet, and had met all discharge criteria and was able to be discharged home later on POD#1.  Laboratory values:  Basename 02/25/12 0447 02/24/12 1330  HGB 11.6* 12.6*  HCT 34.9* 37.5*    Disposition: Home  Discharge instruction: He was instructed to be ambulatory but to refrain from heavy lifting, strenuous activity, or driving. He was instructed on urethral catheter care.  Discharge medications:   Medication List  As of 02/25/2012  1:59 PM   START taking these medications         ciprofloxacin 500 MG tablet   Commonly known as: CIPRO   Take 1 tablet (500 mg total) by mouth 2 (two) times daily. Start day prior to office visit for foley removal      HYDROcodone-acetaminophen 5-325 MG per tablet   Commonly known as: NORCO   Take 1-2 tablets by mouth every 6 (six) hours as needed for pain.         CONTINUE taking these medications         phenytoin 100 MG ER capsule   Commonly known as:  DILANTIN      verapamil 120 MG tablet   Commonly known as: CALAN         STOP taking these medications         aspirin 325 MG tablet          Where to get your medications    These are the prescriptions that you need to pick up.   You may get these medications from any pharmacy.         ciprofloxacin 500 MG tablet   HYDROcodone-acetaminophen 5-325 MG per tablet            Followup: He will followup in 1 week for catheter removal and to discuss his surgical pathology results.

## 2012-02-25 NOTE — Care Management Note (Signed)
    Page 1 of 1   02/25/2012     2:14:13 PM   CARE MANAGEMENT NOTE 02/25/2012  Patient:  Joseph Rangel, Joseph Rangel   Account Number:  [id number]  Date Initiated:  02/25/2012  Documentation initiated by:  Lanier Clam  Subjective/Objective Assessment:   ADMITTED W/PROSTATE CA.PROSTATECTOMY.     Action/Plan:   FROM HOME   Anticipated DC Date:  02/25/2012   Anticipated DC Plan:  HOME/SELF CARE      DC Planning Services  CM consult      Choice offered to / List presented to:             Status of service:  Completed, signed off Medicare Important Message given?   (If response is "NO", the following Medicare IM given date fields will be blank) Date Medicare IM given:   Date Additional Medicare IM given:    Discharge Disposition:  HOME/SELF CARE  Per UR Regulation:  Reviewed for med. necessity/level of care/duration of stay  If discussed at Long Length of Stay Meetings, dates discussed:    Comments:  02/25/12 [facility] RN,BSN NCM [phone]

## 2012-02-25 NOTE — OR Nursing (Signed)
02/24/1202  Late entry: procedure end time entered. Verlon Setting, rn

## 2012-02-25 NOTE — Progress Notes (Signed)
1 Day Post-Op Subjective: Patient reports pain control good.  Is sore.  Has ambulated.  No N/V.  No flatus.  Objective: Vital signs in last 24 hours: Temp:  [96.8 F (36 C)-99.1 F (37.3 C)] 99.1 F (37.3 C) (06/06 0504) Pulse Rate:  [70-106] 97  (06/06 0504) Resp:  [7-17] 16  (06/06 0504) BP: (125-202)/(78-102) 136/80 mmHg (06/06 0504) SpO2:  [96 %-100 %] 96 % (06/06 0504) Weight:  [75.705 kg (166 lb 14.4 oz)] 75.705 kg (166 lb 14.4 oz) (06/05 1445)  Intake/Output from previous day: 06/05 0701 - 06/06 0700 In: 2800 [I.V.:2400; IV Piggyback:400] Out: 2185 [Urine:1575; Drains:90; Blood:400] Intake/Output this shift:    Physical Exam:  General:alert, cooperative and no distress Cardiac: RRR Lungs: CTA GI: soft; tender; faint BS JP in place SCDs in place Urine: pink    Lab Results:  Basename 02/25/12 0447 02/24/12 1330  HGB 11.6* 12.6*  HCT 34.9* 37.5*   BMET  Basename 02/25/12 0447 02/24/12 1330  NA 132* 134*  K 3.7 3.8  CL 101 101  CO2 24 24  GLUCOSE 127* 148*  BUN 8 10  CREATININE 0.80 0.76  CALCIUM 7.9* 8.2*   No results found for this basename: LABPT:3,INR:3 in the last 72 hours No results found for this basename: LABURIN:1 in the last 72 hours Results for orders placed during the hospital encounter of 02/18/12  SURGICAL PCR SCREEN     Status: Normal   Collection Time   02/18/12 11:23 AM      Component Value Range Status Comment   MRSA, PCR NEGATIVE  NEGATIVE  Final    Staphylococcus aureus NEGATIVE  NEGATIVE  Final     Studies/Results: No results found.  Assessment/Plan: 1 Day Post-Op Procedure(s) (LRB): ROBOTIC ASSISTED LAPAROSCOPIC RADICAL PROSTATECTOMY (N/A) LYMPHADENECTOMY (Bilateral) MINOR EXCISION OF MASS ()  Ambulate  I.S.  Dulcolax supp  D/C JP  Continue clears until some bowel function returns  Poss d/c later today   LOS: 1 day   Rangel,Joseph Terhaar G. 02/25/2012, 7:21 AM

## 2012-02-25 NOTE — Progress Notes (Signed)
Patient discharged to home via w/c in company of family members. Patient verbalized understanding of foley/leg bag care, diet, incisional care, activity and f/u care. Prescriptions given to patient as well as supplies for home. Stable upon discharge. Ginny Forth

## 2013-12-01 ENCOUNTER — Other Ambulatory Visit: Payer: Self-pay | Admitting: Family Medicine

## 2013-12-01 DIAGNOSIS — R1011 Right upper quadrant pain: Secondary | ICD-10-CM

## 2013-12-07 ENCOUNTER — Ambulatory Visit
Admission: RE | Admit: 2013-12-07 | Discharge: 2013-12-07 | Disposition: A | Discharge status: Home / Self Care | Payer: Commercial Managed Care - HMO | Source: Ambulatory Visit | Attending: Family Medicine | Admitting: Family Medicine

## 2013-12-07 DIAGNOSIS — R1011 Right upper quadrant pain: Secondary | ICD-10-CM

## 2013-12-21 ENCOUNTER — Other Ambulatory Visit (HOSPITAL_COMMUNITY): Payer: Self-pay | Admitting: Family Medicine

## 2013-12-21 DIAGNOSIS — R109 Unspecified abdominal pain: Secondary | ICD-10-CM

## 2013-12-27 ENCOUNTER — Encounter (HOSPITAL_COMMUNITY)
Admission: RE | Admit: 2013-12-27 | Discharge: 2013-12-27 | Disposition: A | Discharge status: Home / Self Care | Payer: Medicare HMO | Source: Ambulatory Visit | Attending: Family Medicine | Admitting: Family Medicine

## 2013-12-27 DIAGNOSIS — R109 Unspecified abdominal pain: Secondary | ICD-10-CM | POA: Insufficient documentation

## 2013-12-27 MED ORDER — TECHNETIUM TC 99M MEBROFENIN IV KIT
5.0000 | PACK | Freq: Once | INTRAVENOUS | Status: AC | PRN
  Administered 2013-12-27: 5 via INTRAVENOUS

## 2015-01-04 DIAGNOSIS — C61 Malignant neoplasm of prostate: Secondary | ICD-10-CM | POA: Diagnosis not present

## 2015-03-27 DIAGNOSIS — M79602 Pain in left arm: Secondary | ICD-10-CM | POA: Diagnosis not present

## 2015-03-27 DIAGNOSIS — G40909 Epilepsy, unspecified, not intractable, without status epilepticus: Secondary | ICD-10-CM | POA: Diagnosis not present

## 2015-03-27 DIAGNOSIS — E781 Pure hyperglyceridemia: Secondary | ICD-10-CM | POA: Diagnosis not present

## 2015-03-27 DIAGNOSIS — I10 Essential (primary) hypertension: Secondary | ICD-10-CM | POA: Diagnosis not present

## 2015-07-16 DIAGNOSIS — N393 Stress incontinence (female) (male): Secondary | ICD-10-CM | POA: Diagnosis not present

## 2015-07-16 DIAGNOSIS — C61 Malignant neoplasm of prostate: Secondary | ICD-10-CM | POA: Diagnosis not present

## 2015-08-19 DIAGNOSIS — G40909 Epilepsy, unspecified, not intractable, without status epilepticus: Secondary | ICD-10-CM | POA: Diagnosis not present

## 2015-08-19 DIAGNOSIS — I10 Essential (primary) hypertension: Secondary | ICD-10-CM | POA: Diagnosis not present

## 2015-08-19 DIAGNOSIS — E785 Hyperlipidemia, unspecified: Secondary | ICD-10-CM | POA: Diagnosis not present

## 2015-09-16 DIAGNOSIS — I10 Essential (primary) hypertension: Secondary | ICD-10-CM | POA: Diagnosis not present

## 2015-09-16 DIAGNOSIS — H6122 Impacted cerumen, left ear: Secondary | ICD-10-CM | POA: Diagnosis not present

## 2015-11-22 DIAGNOSIS — Z0000 Encounter for general adult medical examination without abnormal findings: Secondary | ICD-10-CM | POA: Diagnosis not present

## 2015-11-22 DIAGNOSIS — N393 Stress incontinence (female) (male): Secondary | ICD-10-CM | POA: Diagnosis not present

## 2015-11-22 DIAGNOSIS — C61 Malignant neoplasm of prostate: Secondary | ICD-10-CM | POA: Diagnosis not present

## 2016-02-18 DIAGNOSIS — Z6828 Body mass index (BMI) 28.0-28.9, adult: Secondary | ICD-10-CM | POA: Diagnosis not present

## 2016-02-18 DIAGNOSIS — I10 Essential (primary) hypertension: Secondary | ICD-10-CM | POA: Diagnosis not present

## 2016-02-18 DIAGNOSIS — G40909 Epilepsy, unspecified, not intractable, without status epilepticus: Secondary | ICD-10-CM | POA: Diagnosis not present

## 2016-05-18 DIAGNOSIS — I10 Essential (primary) hypertension: Secondary | ICD-10-CM | POA: Diagnosis not present

## 2016-05-18 DIAGNOSIS — G40909 Epilepsy, unspecified, not intractable, without status epilepticus: Secondary | ICD-10-CM | POA: Diagnosis not present

## 2016-05-18 DIAGNOSIS — Z6827 Body mass index (BMI) 27.0-27.9, adult: Secondary | ICD-10-CM | POA: Diagnosis not present

## 2016-07-29 DIAGNOSIS — H57059 Tonic pupil, unspecified eye: Secondary | ICD-10-CM | POA: Diagnosis not present

## 2016-07-29 DIAGNOSIS — E7800 Pure hypercholesterolemia, unspecified: Secondary | ICD-10-CM | POA: Diagnosis not present

## 2016-07-29 DIAGNOSIS — I10 Essential (primary) hypertension: Secondary | ICD-10-CM | POA: Diagnosis not present

## 2016-07-29 DIAGNOSIS — H5200 Hypermetropia, unspecified eye: Secondary | ICD-10-CM | POA: Diagnosis not present

## 2016-07-29 DIAGNOSIS — H2513 Age-related nuclear cataract, bilateral: Secondary | ICD-10-CM | POA: Diagnosis not present

## 2016-08-17 DIAGNOSIS — G40909 Epilepsy, unspecified, not intractable, without status epilepticus: Secondary | ICD-10-CM | POA: Diagnosis not present

## 2016-08-17 DIAGNOSIS — I10 Essential (primary) hypertension: Secondary | ICD-10-CM | POA: Diagnosis not present

## 2016-08-19 DIAGNOSIS — I10 Essential (primary) hypertension: Secondary | ICD-10-CM | POA: Diagnosis not present

## 2016-11-23 DIAGNOSIS — R079 Chest pain, unspecified: Secondary | ICD-10-CM | POA: Diagnosis not present

## 2016-11-23 DIAGNOSIS — G40909 Epilepsy, unspecified, not intractable, without status epilepticus: Secondary | ICD-10-CM | POA: Diagnosis not present

## 2016-11-23 DIAGNOSIS — I10 Essential (primary) hypertension: Secondary | ICD-10-CM | POA: Diagnosis not present

## 2017-03-01 DIAGNOSIS — E118 Type 2 diabetes mellitus with unspecified complications: Secondary | ICD-10-CM | POA: Diagnosis not present

## 2017-03-01 DIAGNOSIS — L600 Ingrowing nail: Secondary | ICD-10-CM | POA: Diagnosis not present

## 2017-06-01 DIAGNOSIS — I10 Essential (primary) hypertension: Secondary | ICD-10-CM | POA: Diagnosis not present

## 2017-06-01 DIAGNOSIS — E781 Pure hyperglyceridemia: Secondary | ICD-10-CM | POA: Diagnosis not present

## 2017-06-01 DIAGNOSIS — G40909 Epilepsy, unspecified, not intractable, without status epilepticus: Secondary | ICD-10-CM | POA: Diagnosis not present

## 2017-09-06 DIAGNOSIS — G40909 Epilepsy, unspecified, not intractable, without status epilepticus: Secondary | ICD-10-CM | POA: Diagnosis not present

## 2017-09-06 DIAGNOSIS — H6123 Impacted cerumen, bilateral: Secondary | ICD-10-CM | POA: Diagnosis not present

## 2017-09-06 DIAGNOSIS — Z720 Tobacco use: Secondary | ICD-10-CM | POA: Diagnosis not present

## 2017-09-06 DIAGNOSIS — I10 Essential (primary) hypertension: Secondary | ICD-10-CM | POA: Diagnosis not present

## 2017-12-03 DIAGNOSIS — H527 Unspecified disorder of refraction: Secondary | ICD-10-CM | POA: Diagnosis not present

## 2017-12-03 DIAGNOSIS — I10 Essential (primary) hypertension: Secondary | ICD-10-CM | POA: Diagnosis not present

## 2018-01-10 DIAGNOSIS — E785 Hyperlipidemia, unspecified: Secondary | ICD-10-CM | POA: Diagnosis not present

## 2018-01-10 DIAGNOSIS — G40909 Epilepsy, unspecified, not intractable, without status epilepticus: Secondary | ICD-10-CM | POA: Diagnosis not present

## 2018-01-10 DIAGNOSIS — I10 Essential (primary) hypertension: Secondary | ICD-10-CM | POA: Diagnosis not present

## 2018-05-17 DIAGNOSIS — I10 Essential (primary) hypertension: Secondary | ICD-10-CM | POA: Diagnosis not present

## 2018-05-17 DIAGNOSIS — Z720 Tobacco use: Secondary | ICD-10-CM | POA: Diagnosis not present

## 2018-05-17 DIAGNOSIS — Z6827 Body mass index (BMI) 27.0-27.9, adult: Secondary | ICD-10-CM | POA: Diagnosis not present

## 2018-05-17 DIAGNOSIS — R569 Unspecified convulsions: Secondary | ICD-10-CM | POA: Diagnosis not present

## 2018-08-16 DIAGNOSIS — Z720 Tobacco use: Secondary | ICD-10-CM | POA: Diagnosis not present

## 2018-08-16 DIAGNOSIS — Z0000 Encounter for general adult medical examination without abnormal findings: Secondary | ICD-10-CM | POA: Diagnosis not present

## 2018-08-16 DIAGNOSIS — E785 Hyperlipidemia, unspecified: Secondary | ICD-10-CM | POA: Diagnosis not present

## 2018-08-16 DIAGNOSIS — I10 Essential (primary) hypertension: Secondary | ICD-10-CM | POA: Diagnosis not present

## 2018-08-16 DIAGNOSIS — R569 Unspecified convulsions: Secondary | ICD-10-CM | POA: Diagnosis not present

## 2018-11-17 DIAGNOSIS — J9801 Acute bronchospasm: Secondary | ICD-10-CM | POA: Diagnosis not present

## 2018-11-17 DIAGNOSIS — E785 Hyperlipidemia, unspecified: Secondary | ICD-10-CM | POA: Diagnosis not present

## 2018-11-17 DIAGNOSIS — I10 Essential (primary) hypertension: Secondary | ICD-10-CM | POA: Diagnosis not present

## 2018-11-17 DIAGNOSIS — Z720 Tobacco use: Secondary | ICD-10-CM | POA: Diagnosis not present

## 2018-12-13 DIAGNOSIS — I10 Essential (primary) hypertension: Secondary | ICD-10-CM | POA: Diagnosis not present

## 2018-12-13 DIAGNOSIS — J449 Chronic obstructive pulmonary disease, unspecified: Secondary | ICD-10-CM | POA: Diagnosis not present

## 2018-12-13 DIAGNOSIS — R569 Unspecified convulsions: Secondary | ICD-10-CM | POA: Diagnosis not present

## 2018-12-13 DIAGNOSIS — Z720 Tobacco use: Secondary | ICD-10-CM | POA: Diagnosis not present

## 2018-12-21 DIAGNOSIS — I10 Essential (primary) hypertension: Secondary | ICD-10-CM | POA: Diagnosis not present

## 2018-12-28 DIAGNOSIS — F17210 Nicotine dependence, cigarettes, uncomplicated: Secondary | ICD-10-CM | POA: Diagnosis not present

## 2018-12-28 DIAGNOSIS — Z716 Tobacco abuse counseling: Secondary | ICD-10-CM | POA: Diagnosis not present

## 2018-12-28 DIAGNOSIS — J9801 Acute bronchospasm: Secondary | ICD-10-CM | POA: Diagnosis not present

## 2018-12-28 DIAGNOSIS — E782 Mixed hyperlipidemia: Secondary | ICD-10-CM | POA: Diagnosis not present

## 2018-12-28 DIAGNOSIS — I10 Essential (primary) hypertension: Secondary | ICD-10-CM | POA: Diagnosis not present

## 2018-12-28 DIAGNOSIS — Z720 Tobacco use: Secondary | ICD-10-CM | POA: Diagnosis not present

## 2019-03-06 DIAGNOSIS — J449 Chronic obstructive pulmonary disease, unspecified: Secondary | ICD-10-CM | POA: Diagnosis not present

## 2019-03-06 DIAGNOSIS — I10 Essential (primary) hypertension: Secondary | ICD-10-CM | POA: Diagnosis not present

## 2019-03-06 DIAGNOSIS — R569 Unspecified convulsions: Secondary | ICD-10-CM | POA: Diagnosis not present

## 2019-03-06 DIAGNOSIS — M545 Low back pain: Secondary | ICD-10-CM | POA: Diagnosis not present

## 2019-04-04 DIAGNOSIS — I10 Essential (primary) hypertension: Secondary | ICD-10-CM | POA: Diagnosis not present

## 2019-04-04 DIAGNOSIS — R569 Unspecified convulsions: Secondary | ICD-10-CM | POA: Diagnosis not present

## 2019-04-04 DIAGNOSIS — M545 Low back pain: Secondary | ICD-10-CM | POA: Diagnosis not present

## 2019-04-05 ENCOUNTER — Other Ambulatory Visit: Payer: Self-pay | Admitting: Family Medicine

## 2019-04-05 ENCOUNTER — Other Ambulatory Visit: Payer: Medicare Other

## 2019-04-05 DIAGNOSIS — M5440 Lumbago with sciatica, unspecified side: Secondary | ICD-10-CM

## 2019-04-06 ENCOUNTER — Other Ambulatory Visit: Payer: Self-pay

## 2019-04-06 ENCOUNTER — Other Ambulatory Visit: Payer: Self-pay | Admitting: Family Medicine

## 2019-04-06 ENCOUNTER — Ambulatory Visit
Admission: RE | Admit: 2019-04-06 | Discharge: 2019-04-06 | Disposition: A | Discharge status: Home / Self Care | Payer: Medicare Other | Source: Ambulatory Visit | Attending: Family Medicine | Admitting: Family Medicine

## 2019-04-06 DIAGNOSIS — M40205 Unspecified kyphosis, thoracolumbar region: Secondary | ICD-10-CM | POA: Diagnosis not present

## 2019-04-06 DIAGNOSIS — M48061 Spinal stenosis, lumbar region without neurogenic claudication: Secondary | ICD-10-CM | POA: Diagnosis not present

## 2019-04-06 DIAGNOSIS — M5440 Lumbago with sciatica, unspecified side: Secondary | ICD-10-CM

## 2019-04-14 DIAGNOSIS — G8929 Other chronic pain: Secondary | ICD-10-CM | POA: Diagnosis not present

## 2019-04-14 DIAGNOSIS — Z79899 Other long term (current) drug therapy: Secondary | ICD-10-CM | POA: Diagnosis not present

## 2019-04-14 DIAGNOSIS — M545 Low back pain: Secondary | ICD-10-CM | POA: Diagnosis not present

## 2019-04-21 DIAGNOSIS — M545 Low back pain: Secondary | ICD-10-CM | POA: Diagnosis not present

## 2019-04-21 DIAGNOSIS — Z79899 Other long term (current) drug therapy: Secondary | ICD-10-CM | POA: Diagnosis not present

## 2019-04-21 DIAGNOSIS — G8929 Other chronic pain: Secondary | ICD-10-CM | POA: Diagnosis not present

## 2019-05-08 DIAGNOSIS — M545 Low back pain: Secondary | ICD-10-CM | POA: Diagnosis not present

## 2019-06-06 DIAGNOSIS — Z0000 Encounter for general adult medical examination without abnormal findings: Secondary | ICD-10-CM | POA: Diagnosis not present

## 2019-06-06 DIAGNOSIS — M1712 Unilateral primary osteoarthritis, left knee: Secondary | ICD-10-CM | POA: Diagnosis not present

## 2019-06-06 DIAGNOSIS — M25462 Effusion, left knee: Secondary | ICD-10-CM | POA: Diagnosis not present

## 2019-06-26 DIAGNOSIS — I10 Essential (primary) hypertension: Secondary | ICD-10-CM | POA: Diagnosis not present

## 2019-06-26 DIAGNOSIS — Z1159 Encounter for screening for other viral diseases: Secondary | ICD-10-CM | POA: Diagnosis not present

## 2019-06-26 DIAGNOSIS — Z79899 Other long term (current) drug therapy: Secondary | ICD-10-CM | POA: Diagnosis not present

## 2019-06-26 DIAGNOSIS — M792 Neuralgia and neuritis, unspecified: Secondary | ICD-10-CM | POA: Diagnosis not present

## 2019-06-26 DIAGNOSIS — M545 Low back pain: Secondary | ICD-10-CM | POA: Diagnosis not present

## 2019-06-26 DIAGNOSIS — G8929 Other chronic pain: Secondary | ICD-10-CM | POA: Diagnosis not present

## 2019-06-26 DIAGNOSIS — Z03818 Encounter for observation for suspected exposure to other biological agents ruled out: Secondary | ICD-10-CM | POA: Diagnosis not present

## 2019-07-27 DIAGNOSIS — M5136 Other intervertebral disc degeneration, lumbar region: Secondary | ICD-10-CM | POA: Diagnosis not present

## 2019-07-27 DIAGNOSIS — M792 Neuralgia and neuritis, unspecified: Secondary | ICD-10-CM | POA: Diagnosis not present

## 2019-07-27 DIAGNOSIS — Z79899 Other long term (current) drug therapy: Secondary | ICD-10-CM | POA: Diagnosis not present

## 2019-07-27 DIAGNOSIS — M545 Low back pain: Secondary | ICD-10-CM | POA: Diagnosis not present

## 2019-07-27 DIAGNOSIS — G8929 Other chronic pain: Secondary | ICD-10-CM | POA: Diagnosis not present

## 2019-08-29 DIAGNOSIS — M792 Neuralgia and neuritis, unspecified: Secondary | ICD-10-CM | POA: Diagnosis not present

## 2019-08-29 DIAGNOSIS — M545 Low back pain: Secondary | ICD-10-CM | POA: Diagnosis not present

## 2019-08-29 DIAGNOSIS — Z79899 Other long term (current) drug therapy: Secondary | ICD-10-CM | POA: Diagnosis not present

## 2019-08-29 DIAGNOSIS — G8929 Other chronic pain: Secondary | ICD-10-CM | POA: Diagnosis not present

## 2019-08-29 DIAGNOSIS — M5136 Other intervertebral disc degeneration, lumbar region: Secondary | ICD-10-CM | POA: Diagnosis not present

## 2019-09-28 DIAGNOSIS — M545 Low back pain: Secondary | ICD-10-CM | POA: Diagnosis not present

## 2019-09-28 DIAGNOSIS — Z79899 Other long term (current) drug therapy: Secondary | ICD-10-CM | POA: Diagnosis not present

## 2019-09-28 DIAGNOSIS — G8929 Other chronic pain: Secondary | ICD-10-CM | POA: Diagnosis not present

## 2019-09-28 DIAGNOSIS — M792 Neuralgia and neuritis, unspecified: Secondary | ICD-10-CM | POA: Diagnosis not present

## 2019-09-28 DIAGNOSIS — M5136 Other intervertebral disc degeneration, lumbar region: Secondary | ICD-10-CM | POA: Diagnosis not present

## 2019-10-23 DIAGNOSIS — M1711 Unilateral primary osteoarthritis, right knee: Secondary | ICD-10-CM | POA: Diagnosis not present

## 2019-10-23 DIAGNOSIS — I10 Essential (primary) hypertension: Secondary | ICD-10-CM | POA: Diagnosis not present

## 2019-10-23 DIAGNOSIS — E781 Pure hyperglyceridemia: Secondary | ICD-10-CM | POA: Diagnosis not present

## 2019-11-01 DIAGNOSIS — Z79899 Other long term (current) drug therapy: Secondary | ICD-10-CM | POA: Diagnosis not present

## 2019-11-01 DIAGNOSIS — M25562 Pain in left knee: Secondary | ICD-10-CM | POA: Diagnosis not present

## 2019-11-01 DIAGNOSIS — G8929 Other chronic pain: Secondary | ICD-10-CM | POA: Diagnosis not present

## 2019-11-01 DIAGNOSIS — M545 Low back pain: Secondary | ICD-10-CM | POA: Diagnosis not present

## 2019-11-01 DIAGNOSIS — M5136 Other intervertebral disc degeneration, lumbar region: Secondary | ICD-10-CM | POA: Diagnosis not present

## 2019-11-01 DIAGNOSIS — F17210 Nicotine dependence, cigarettes, uncomplicated: Secondary | ICD-10-CM | POA: Diagnosis not present

## 2019-11-28 DIAGNOSIS — M545 Low back pain: Secondary | ICD-10-CM | POA: Diagnosis not present

## 2019-11-28 DIAGNOSIS — Z79899 Other long term (current) drug therapy: Secondary | ICD-10-CM | POA: Diagnosis not present

## 2019-11-28 DIAGNOSIS — G8929 Other chronic pain: Secondary | ICD-10-CM | POA: Diagnosis not present

## 2019-11-28 DIAGNOSIS — F17210 Nicotine dependence, cigarettes, uncomplicated: Secondary | ICD-10-CM | POA: Diagnosis not present

## 2019-11-28 DIAGNOSIS — Z1159 Encounter for screening for other viral diseases: Secondary | ICD-10-CM | POA: Diagnosis not present

## 2019-11-28 DIAGNOSIS — M5136 Other intervertebral disc degeneration, lumbar region: Secondary | ICD-10-CM | POA: Diagnosis not present

## 2019-11-28 DIAGNOSIS — M25562 Pain in left knee: Secondary | ICD-10-CM | POA: Diagnosis not present

## 2019-12-26 DIAGNOSIS — H40013 Open angle with borderline findings, low risk, bilateral: Secondary | ICD-10-CM | POA: Diagnosis not present

## 2019-12-26 DIAGNOSIS — H5203 Hypermetropia, bilateral: Secondary | ICD-10-CM | POA: Diagnosis not present

## 2019-12-26 DIAGNOSIS — H52223 Regular astigmatism, bilateral: Secondary | ICD-10-CM | POA: Diagnosis not present

## 2019-12-26 DIAGNOSIS — H524 Presbyopia: Secondary | ICD-10-CM | POA: Diagnosis not present

## 2019-12-26 DIAGNOSIS — H25813 Combined forms of age-related cataract, bilateral: Secondary | ICD-10-CM | POA: Diagnosis not present

## 2019-12-29 DIAGNOSIS — G8929 Other chronic pain: Secondary | ICD-10-CM | POA: Diagnosis not present

## 2019-12-29 DIAGNOSIS — M545 Low back pain: Secondary | ICD-10-CM | POA: Diagnosis not present

## 2019-12-29 DIAGNOSIS — M25562 Pain in left knee: Secondary | ICD-10-CM | POA: Diagnosis not present

## 2019-12-29 DIAGNOSIS — Z79899 Other long term (current) drug therapy: Secondary | ICD-10-CM | POA: Diagnosis not present

## 2019-12-29 DIAGNOSIS — F17210 Nicotine dependence, cigarettes, uncomplicated: Secondary | ICD-10-CM | POA: Diagnosis not present

## 2019-12-29 DIAGNOSIS — M5136 Other intervertebral disc degeneration, lumbar region: Secondary | ICD-10-CM | POA: Diagnosis not present

## 2020-01-01 DIAGNOSIS — H2513 Age-related nuclear cataract, bilateral: Secondary | ICD-10-CM | POA: Diagnosis not present

## 2020-01-01 DIAGNOSIS — H401131 Primary open-angle glaucoma, bilateral, mild stage: Secondary | ICD-10-CM | POA: Diagnosis not present

## 2020-01-15 DIAGNOSIS — H2513 Age-related nuclear cataract, bilateral: Secondary | ICD-10-CM | POA: Diagnosis not present

## 2020-01-15 DIAGNOSIS — H401131 Primary open-angle glaucoma, bilateral, mild stage: Secondary | ICD-10-CM | POA: Diagnosis not present

## 2020-01-15 DIAGNOSIS — H2511 Age-related nuclear cataract, right eye: Secondary | ICD-10-CM | POA: Diagnosis not present

## 2020-01-22 DIAGNOSIS — M25562 Pain in left knee: Secondary | ICD-10-CM | POA: Diagnosis not present

## 2020-01-22 DIAGNOSIS — E781 Pure hyperglyceridemia: Secondary | ICD-10-CM | POA: Diagnosis not present

## 2020-01-22 DIAGNOSIS — I10 Essential (primary) hypertension: Secondary | ICD-10-CM | POA: Diagnosis not present

## 2020-01-22 DIAGNOSIS — Z720 Tobacco use: Secondary | ICD-10-CM | POA: Diagnosis not present

## 2020-02-01 DIAGNOSIS — M545 Low back pain: Secondary | ICD-10-CM | POA: Diagnosis not present

## 2020-02-01 DIAGNOSIS — F17210 Nicotine dependence, cigarettes, uncomplicated: Secondary | ICD-10-CM | POA: Diagnosis not present

## 2020-02-01 DIAGNOSIS — M25562 Pain in left knee: Secondary | ICD-10-CM | POA: Diagnosis not present

## 2020-02-01 DIAGNOSIS — Z79899 Other long term (current) drug therapy: Secondary | ICD-10-CM | POA: Diagnosis not present

## 2020-02-01 DIAGNOSIS — M5136 Other intervertebral disc degeneration, lumbar region: Secondary | ICD-10-CM | POA: Diagnosis not present

## 2020-02-01 DIAGNOSIS — G8929 Other chronic pain: Secondary | ICD-10-CM | POA: Diagnosis not present

## 2020-02-22 DIAGNOSIS — Z961 Presence of intraocular lens: Secondary | ICD-10-CM | POA: Diagnosis not present

## 2020-02-22 DIAGNOSIS — H401111 Primary open-angle glaucoma, right eye, mild stage: Secondary | ICD-10-CM | POA: Diagnosis not present

## 2020-02-22 DIAGNOSIS — H2511 Age-related nuclear cataract, right eye: Secondary | ICD-10-CM | POA: Diagnosis not present

## 2020-02-23 DIAGNOSIS — H2512 Age-related nuclear cataract, left eye: Secondary | ICD-10-CM | POA: Diagnosis not present

## 2020-02-26 ENCOUNTER — Ambulatory Visit
Admission: RE | Admit: 2020-02-26 | Discharge: 2020-02-26 | Disposition: A | Discharge status: Home / Self Care | Payer: Medicare Other | Source: Ambulatory Visit | Attending: Family Medicine | Admitting: Family Medicine

## 2020-02-26 ENCOUNTER — Other Ambulatory Visit: Payer: Self-pay | Admitting: Family Medicine

## 2020-02-26 DIAGNOSIS — I10 Essential (primary) hypertension: Secondary | ICD-10-CM | POA: Diagnosis not present

## 2020-02-26 DIAGNOSIS — M25562 Pain in left knee: Secondary | ICD-10-CM

## 2020-02-26 DIAGNOSIS — M79672 Pain in left foot: Secondary | ICD-10-CM

## 2020-02-26 DIAGNOSIS — M19072 Primary osteoarthritis, left ankle and foot: Secondary | ICD-10-CM | POA: Diagnosis not present

## 2020-02-26 DIAGNOSIS — M130 Polyarthritis, unspecified: Secondary | ICD-10-CM | POA: Diagnosis not present

## 2020-02-26 DIAGNOSIS — M545 Low back pain: Secondary | ICD-10-CM | POA: Diagnosis not present

## 2020-02-26 DIAGNOSIS — Z6826 Body mass index (BMI) 26.0-26.9, adult: Secondary | ICD-10-CM | POA: Diagnosis not present

## 2020-02-26 DIAGNOSIS — Z720 Tobacco use: Secondary | ICD-10-CM | POA: Diagnosis not present

## 2020-03-01 DIAGNOSIS — M5136 Other intervertebral disc degeneration, lumbar region: Secondary | ICD-10-CM | POA: Diagnosis not present

## 2020-03-01 DIAGNOSIS — Z79899 Other long term (current) drug therapy: Secondary | ICD-10-CM | POA: Diagnosis not present

## 2020-03-01 DIAGNOSIS — G8929 Other chronic pain: Secondary | ICD-10-CM | POA: Diagnosis not present

## 2020-03-01 DIAGNOSIS — F17210 Nicotine dependence, cigarettes, uncomplicated: Secondary | ICD-10-CM | POA: Diagnosis not present

## 2020-03-01 DIAGNOSIS — M25562 Pain in left knee: Secondary | ICD-10-CM | POA: Diagnosis not present

## 2020-03-01 DIAGNOSIS — M545 Low back pain: Secondary | ICD-10-CM | POA: Diagnosis not present

## 2020-03-07 DIAGNOSIS — H5203 Hypermetropia, bilateral: Secondary | ICD-10-CM | POA: Diagnosis not present

## 2020-03-07 DIAGNOSIS — H401121 Primary open-angle glaucoma, left eye, mild stage: Secondary | ICD-10-CM | POA: Diagnosis not present

## 2020-03-07 DIAGNOSIS — Z961 Presence of intraocular lens: Secondary | ICD-10-CM | POA: Diagnosis not present

## 2020-03-07 DIAGNOSIS — H52222 Regular astigmatism, left eye: Secondary | ICD-10-CM | POA: Diagnosis not present

## 2020-03-07 DIAGNOSIS — H2512 Age-related nuclear cataract, left eye: Secondary | ICD-10-CM | POA: Diagnosis not present

## 2020-03-07 DIAGNOSIS — H524 Presbyopia: Secondary | ICD-10-CM | POA: Diagnosis not present

## 2020-03-11 ENCOUNTER — Other Ambulatory Visit (HOSPITAL_COMMUNITY): Payer: Self-pay | Admitting: Family Medicine

## 2020-03-11 DIAGNOSIS — M79604 Pain in right leg: Secondary | ICD-10-CM | POA: Diagnosis not present

## 2020-03-11 DIAGNOSIS — M79605 Pain in left leg: Secondary | ICD-10-CM | POA: Diagnosis not present

## 2020-03-12 ENCOUNTER — Other Ambulatory Visit (HOSPITAL_COMMUNITY): Payer: Self-pay | Admitting: Family Medicine

## 2020-03-12 DIAGNOSIS — M79605 Pain in left leg: Secondary | ICD-10-CM

## 2020-03-13 ENCOUNTER — Other Ambulatory Visit: Payer: Self-pay

## 2020-03-13 ENCOUNTER — Ambulatory Visit (HOSPITAL_COMMUNITY)
Admission: RE | Admit: 2020-03-13 | Discharge: 2020-03-13 | Disposition: A | Discharge status: Home / Self Care | Payer: Medicare HMO | Source: Ambulatory Visit

## 2020-03-13 ENCOUNTER — Encounter (HOSPITAL_COMMUNITY): Payer: Self-pay

## 2020-03-13 DIAGNOSIS — M79605 Pain in left leg: Secondary | ICD-10-CM

## 2020-03-20 DIAGNOSIS — G40909 Epilepsy, unspecified, not intractable, without status epilepticus: Secondary | ICD-10-CM | POA: Diagnosis not present

## 2020-03-20 DIAGNOSIS — E7849 Other hyperlipidemia: Secondary | ICD-10-CM | POA: Diagnosis not present

## 2020-03-20 DIAGNOSIS — I10 Essential (primary) hypertension: Secondary | ICD-10-CM | POA: Diagnosis not present

## 2020-03-20 DIAGNOSIS — M130 Polyarthritis, unspecified: Secondary | ICD-10-CM | POA: Diagnosis not present

## 2020-03-28 DIAGNOSIS — I10 Essential (primary) hypertension: Secondary | ICD-10-CM | POA: Diagnosis not present

## 2020-03-28 DIAGNOSIS — M545 Low back pain: Secondary | ICD-10-CM | POA: Diagnosis not present

## 2020-03-28 DIAGNOSIS — Z79899 Other long term (current) drug therapy: Secondary | ICD-10-CM | POA: Diagnosis not present

## 2020-03-28 DIAGNOSIS — G8929 Other chronic pain: Secondary | ICD-10-CM | POA: Diagnosis not present

## 2020-03-29 DIAGNOSIS — Z0100 Encounter for examination of eyes and vision without abnormal findings: Secondary | ICD-10-CM | POA: Diagnosis not present

## 2020-04-30 DIAGNOSIS — M545 Low back pain: Secondary | ICD-10-CM | POA: Diagnosis not present

## 2020-04-30 DIAGNOSIS — G8929 Other chronic pain: Secondary | ICD-10-CM | POA: Diagnosis not present

## 2020-04-30 DIAGNOSIS — M5136 Other intervertebral disc degeneration, lumbar region: Secondary | ICD-10-CM | POA: Diagnosis not present

## 2020-04-30 DIAGNOSIS — F17210 Nicotine dependence, cigarettes, uncomplicated: Secondary | ICD-10-CM | POA: Diagnosis not present

## 2020-04-30 DIAGNOSIS — Z79899 Other long term (current) drug therapy: Secondary | ICD-10-CM | POA: Diagnosis not present

## 2020-04-30 DIAGNOSIS — M25562 Pain in left knee: Secondary | ICD-10-CM | POA: Diagnosis not present

## 2020-05-13 DIAGNOSIS — R569 Unspecified convulsions: Secondary | ICD-10-CM | POA: Diagnosis not present

## 2020-05-13 DIAGNOSIS — M1712 Unilateral primary osteoarthritis, left knee: Secondary | ICD-10-CM | POA: Diagnosis not present

## 2020-05-13 DIAGNOSIS — R202 Paresthesia of skin: Secondary | ICD-10-CM | POA: Diagnosis not present

## 2020-05-13 DIAGNOSIS — I10 Essential (primary) hypertension: Secondary | ICD-10-CM | POA: Diagnosis not present

## 2020-05-13 DIAGNOSIS — M79662 Pain in left lower leg: Secondary | ICD-10-CM | POA: Diagnosis not present

## 2020-05-21 DIAGNOSIS — I10 Essential (primary) hypertension: Secondary | ICD-10-CM | POA: Diagnosis not present

## 2020-05-21 DIAGNOSIS — E7849 Other hyperlipidemia: Secondary | ICD-10-CM | POA: Diagnosis not present

## 2020-05-21 DIAGNOSIS — M130 Polyarthritis, unspecified: Secondary | ICD-10-CM | POA: Diagnosis not present

## 2020-05-21 DIAGNOSIS — G40909 Epilepsy, unspecified, not intractable, without status epilepticus: Secondary | ICD-10-CM | POA: Diagnosis not present

## 2020-05-31 DIAGNOSIS — M545 Low back pain: Secondary | ICD-10-CM | POA: Diagnosis not present

## 2020-05-31 DIAGNOSIS — Z79899 Other long term (current) drug therapy: Secondary | ICD-10-CM | POA: Diagnosis not present

## 2020-05-31 DIAGNOSIS — R1011 Right upper quadrant pain: Secondary | ICD-10-CM | POA: Diagnosis not present

## 2020-05-31 DIAGNOSIS — Z20822 Contact with and (suspected) exposure to covid-19: Secondary | ICD-10-CM | POA: Diagnosis not present

## 2020-05-31 DIAGNOSIS — M1712 Unilateral primary osteoarthritis, left knee: Secondary | ICD-10-CM | POA: Diagnosis not present

## 2020-05-31 DIAGNOSIS — F17210 Nicotine dependence, cigarettes, uncomplicated: Secondary | ICD-10-CM | POA: Diagnosis not present

## 2020-05-31 DIAGNOSIS — M5136 Other intervertebral disc degeneration, lumbar region: Secondary | ICD-10-CM | POA: Diagnosis not present

## 2020-06-24 DIAGNOSIS — I10 Essential (primary) hypertension: Secondary | ICD-10-CM | POA: Diagnosis not present

## 2020-06-24 DIAGNOSIS — I739 Peripheral vascular disease, unspecified: Secondary | ICD-10-CM | POA: Diagnosis not present

## 2020-06-24 DIAGNOSIS — E785 Hyperlipidemia, unspecified: Secondary | ICD-10-CM | POA: Diagnosis not present

## 2020-06-24 DIAGNOSIS — F17219 Nicotine dependence, cigarettes, with unspecified nicotine-induced disorders: Secondary | ICD-10-CM | POA: Diagnosis not present

## 2020-06-24 DIAGNOSIS — R569 Unspecified convulsions: Secondary | ICD-10-CM | POA: Diagnosis not present

## 2020-06-24 DIAGNOSIS — Z720 Tobacco use: Secondary | ICD-10-CM | POA: Diagnosis not present

## 2020-06-24 DIAGNOSIS — F17210 Nicotine dependence, cigarettes, uncomplicated: Secondary | ICD-10-CM | POA: Diagnosis not present

## 2020-06-25 ENCOUNTER — Other Ambulatory Visit: Payer: Self-pay | Admitting: Physician Assistant

## 2020-06-25 DIAGNOSIS — R6889 Other general symptoms and signs: Secondary | ICD-10-CM

## 2020-06-28 DIAGNOSIS — M5136 Other intervertebral disc degeneration, lumbar region: Secondary | ICD-10-CM | POA: Diagnosis not present

## 2020-06-28 DIAGNOSIS — M545 Low back pain, unspecified: Secondary | ICD-10-CM | POA: Diagnosis not present

## 2020-06-28 DIAGNOSIS — M1712 Unilateral primary osteoarthritis, left knee: Secondary | ICD-10-CM | POA: Diagnosis not present

## 2020-06-28 DIAGNOSIS — Z79899 Other long term (current) drug therapy: Secondary | ICD-10-CM | POA: Diagnosis not present

## 2020-06-28 DIAGNOSIS — F17210 Nicotine dependence, cigarettes, uncomplicated: Secondary | ICD-10-CM | POA: Diagnosis not present

## 2020-06-28 DIAGNOSIS — G8929 Other chronic pain: Secondary | ICD-10-CM | POA: Diagnosis not present

## 2020-07-11 ENCOUNTER — Ambulatory Visit
Admission: RE | Admit: 2020-07-11 | Discharge: 2020-07-11 | Disposition: A | Discharge status: Home / Self Care | Payer: Medicare HMO | Source: Ambulatory Visit | Attending: Physician Assistant | Admitting: Physician Assistant

## 2020-07-11 DIAGNOSIS — I743 Embolism and thrombosis of arteries of the lower extremities: Secondary | ICD-10-CM | POA: Diagnosis not present

## 2020-07-11 DIAGNOSIS — I70203 Unspecified atherosclerosis of native arteries of extremities, bilateral legs: Secondary | ICD-10-CM | POA: Diagnosis not present

## 2020-07-11 DIAGNOSIS — R6889 Other general symptoms and signs: Secondary | ICD-10-CM

## 2020-07-11 MED ORDER — IOPAMIDOL (ISOVUE-370) INJECTION 76%
100.0000 mL | Freq: Once | INTRAVENOUS | Status: AC | PRN
  Administered 2020-07-11: 100 mL via INTRAVENOUS

## 2020-07-23 ENCOUNTER — Telehealth: Payer: Self-pay | Admitting: Hematology and Oncology

## 2020-07-23 DIAGNOSIS — M1712 Unilateral primary osteoarthritis, left knee: Secondary | ICD-10-CM | POA: Diagnosis not present

## 2020-07-23 DIAGNOSIS — M5136 Other intervertebral disc degeneration, lumbar region: Secondary | ICD-10-CM | POA: Diagnosis not present

## 2020-07-23 DIAGNOSIS — M545 Low back pain, unspecified: Secondary | ICD-10-CM | POA: Diagnosis not present

## 2020-07-23 DIAGNOSIS — Z79899 Other long term (current) drug therapy: Secondary | ICD-10-CM | POA: Diagnosis not present

## 2020-07-23 DIAGNOSIS — F17210 Nicotine dependence, cigarettes, uncomplicated: Secondary | ICD-10-CM | POA: Diagnosis not present

## 2020-07-23 DIAGNOSIS — M792 Neuralgia and neuritis, unspecified: Secondary | ICD-10-CM | POA: Diagnosis not present

## 2020-07-23 NOTE — Telephone Encounter (Signed)
Received a new pt referral from Dr. Georjean Mode at [facility] for generalized enlarged lymph nodes. Joseph Rangel has been scheduled to see Dr. Chryl Heck on 11/10 at 1pm. Appt date and time has been given to the pt's wife. Aware to arrive 20 minutes early.

## 2020-07-30 ENCOUNTER — Ambulatory Visit: Payer: Medicare HMO | Admitting: Hematology

## 2020-07-31 ENCOUNTER — Inpatient Hospital Stay: Payer: Medicare HMO | Attending: Hematology | Admitting: Hematology and Oncology

## 2020-07-31 ENCOUNTER — Other Ambulatory Visit: Payer: Self-pay | Admitting: Hematology and Oncology

## 2020-07-31 DIAGNOSIS — D649 Anemia, unspecified: Secondary | ICD-10-CM

## 2020-07-31 DIAGNOSIS — C61 Malignant neoplasm of prostate: Secondary | ICD-10-CM | POA: Insufficient documentation

## 2020-07-31 DIAGNOSIS — I10 Essential (primary) hypertension: Secondary | ICD-10-CM | POA: Insufficient documentation

## 2020-07-31 DIAGNOSIS — Z87891 Personal history of nicotine dependence: Secondary | ICD-10-CM | POA: Insufficient documentation

## 2020-07-31 DIAGNOSIS — R590 Localized enlarged lymph nodes: Secondary | ICD-10-CM

## 2020-07-31 DIAGNOSIS — M1990 Unspecified osteoarthritis, unspecified site: Secondary | ICD-10-CM | POA: Insufficient documentation

## 2020-08-05 ENCOUNTER — Telehealth: Payer: Self-pay | Admitting: Hematology and Oncology

## 2020-08-05 NOTE — Telephone Encounter (Signed)
Pt has been cld and rescheduled to see Dr. Chryl Heck on 11/18 at 1pm.

## 2020-08-08 ENCOUNTER — Other Ambulatory Visit: Payer: Self-pay

## 2020-08-08 ENCOUNTER — Telehealth: Payer: Self-pay | Admitting: Hematology and Oncology

## 2020-08-08 ENCOUNTER — Inpatient Hospital Stay: Payer: Medicare HMO | Admitting: Hematology and Oncology

## 2020-08-08 NOTE — Telephone Encounter (Signed)
Patient got lost coming to appt and was at the wrong location. R/s todays appt for 11/19 per new patient coordinator. Confirmed appt with patient

## 2020-08-09 ENCOUNTER — Inpatient Hospital Stay: Payer: Medicare HMO

## 2020-08-09 ENCOUNTER — Encounter: Payer: Self-pay | Admitting: Hematology and Oncology

## 2020-08-09 ENCOUNTER — Other Ambulatory Visit: Payer: Self-pay

## 2020-08-09 ENCOUNTER — Inpatient Hospital Stay (HOSPITAL_BASED_OUTPATIENT_CLINIC_OR_DEPARTMENT_OTHER): Payer: Medicare HMO | Admitting: Hematology and Oncology

## 2020-08-09 VITALS — BP 134/80 | HR 78 | Temp 97.8°F | Resp 20 | Ht 69.0 in | Wt 163.3 lb

## 2020-08-09 DIAGNOSIS — R590 Localized enlarged lymph nodes: Secondary | ICD-10-CM | POA: Diagnosis not present

## 2020-08-09 DIAGNOSIS — M1990 Unspecified osteoarthritis, unspecified site: Secondary | ICD-10-CM | POA: Diagnosis not present

## 2020-08-09 DIAGNOSIS — Z8546 Personal history of malignant neoplasm of prostate: Secondary | ICD-10-CM | POA: Diagnosis not present

## 2020-08-09 DIAGNOSIS — Z87891 Personal history of nicotine dependence: Secondary | ICD-10-CM | POA: Diagnosis not present

## 2020-08-09 DIAGNOSIS — C61 Malignant neoplasm of prostate: Secondary | ICD-10-CM | POA: Diagnosis not present

## 2020-08-09 DIAGNOSIS — D649 Anemia, unspecified: Secondary | ICD-10-CM

## 2020-08-09 DIAGNOSIS — I10 Essential (primary) hypertension: Secondary | ICD-10-CM | POA: Diagnosis not present

## 2020-08-09 LAB — CMP (CANCER CENTER ONLY)
ALT: 11 U/L (ref 0–44)
AST: 14 U/L — ABNORMAL LOW (ref 15–41)
Albumin: 4.2 g/dL (ref 3.5–5.0)
Alkaline Phosphatase: 138 U/L — ABNORMAL HIGH (ref 38–126)
Anion gap: 8 (ref 5–15)
BUN: 21 mg/dL (ref 8–23)
CO2: 25 mmol/L (ref 22–32)
Calcium: 9.2 mg/dL (ref 8.9–10.3)
Chloride: 105 mmol/L (ref 98–111)
Creatinine: 1.44 mg/dL — ABNORMAL HIGH (ref 0.61–1.24)
GFR, Estimated: 51 mL/min — ABNORMAL LOW (ref >60)
Glucose, Bld: 111 mg/dL — ABNORMAL HIGH (ref 70–99)
Potassium: 3.9 mmol/L (ref 3.5–5.1)
Sodium: 138 mmol/L (ref 135–145)
Total Bilirubin: 0.3 mg/dL (ref 0.3–1.2)
Total Protein: 7.9 g/dL (ref 6.5–8.1)

## 2020-08-09 LAB — CBC WITH DIFFERENTIAL/PLATELET
Abs Immature Granulocytes: 0.01 10*3/uL (ref 0.00–0.07)
Basophils Absolute: 0.1 10*3/uL (ref 0.0–0.1)
Basophils Relative: 1 %
Eosinophils Absolute: 0.3 10*3/uL (ref 0.0–0.5)
Eosinophils Relative: 3 %
HCT: 32.9 % — ABNORMAL LOW (ref 39.0–52.0)
Hemoglobin: 10.7 g/dL — ABNORMAL LOW (ref 13.0–17.0)
Immature Granulocytes: 0 %
Lymphocytes Relative: 25 %
Lymphs Abs: 2.2 10*3/uL (ref 0.7–4.0)
MCH: 29.7 pg (ref 26.0–34.0)
MCHC: 32.5 g/dL (ref 30.0–36.0)
MCV: 91.4 fL (ref 80.0–100.0)
Monocytes Absolute: 0.6 10*3/uL (ref 0.1–1.0)
Monocytes Relative: 6 %
Neutro Abs: 5.8 10*3/uL (ref 1.7–7.7)
Neutrophils Relative %: 65 %
Platelets: 236 10*3/uL (ref 150–400)
RBC: 3.6 MIL/uL — ABNORMAL LOW (ref 4.22–5.81)
RDW: 13.4 % (ref 11.5–15.5)
WBC: 9 10*3/uL (ref 4.0–10.5)
nRBC: 0 % (ref 0.0–0.2)

## 2020-08-09 LAB — LACTATE DEHYDROGENASE: LDH: 165 U/L (ref 98–192)

## 2020-08-09 LAB — VITAMIN B12: Vitamin B-12: 205 pg/mL (ref 180–914)

## 2020-08-09 NOTE — Progress Notes (Signed)
[place] CONSULT NOTE  Patient Care Team: Iona Beard, MD as PCP - General (Family Medicine)  CHIEF COMPLAINTS/PURPOSE OF CONSULTATION:  Abnormal imaging.  ASSESSMENT & PLAN:  No problem-specific Assessment & Plan notes found for this encounter.  Orders Placed This Encounter  Procedures  . CBC with Differential/Platelet    Standing Status:   Standing    Number of Occurrences:   22    Standing Expiration Date:   08/09/2021  . CMP ([place] only)    Standing Status:   Future    Number of Occurrences:   1    Standing Expiration Date:   08/09/2021  . PSA, total and free    Standing Status:   Future    Number of Occurrences:   1    Standing Expiration Date:   08/09/2021  . Lactate dehydrogenase    Standing Status:   Future    Number of Occurrences:   1    Standing Expiration Date:   08/09/2021   This is a very pleasant 74 yr old male patient with PMH significant for prostate adenocarcinoma Gleason 3+4, no LN involvement s.p robotic prostatectomy in 2013, who had some imaging done for his CAD referred to hematology for further evaluation of irregular appearing intra pelvic, inguinal and periaortic LN, largest about the left iliac measuring approximately 1.7 cms. He denies any new complaints, has some urinary incontinence since the surgery and this hasn't changed. He has chronic back pain and LE pain, this hasnt changed. No new symptoms overall. PE, chronically ill appearing man, walks slowly with a cane given his PAD and severe LE pain. No palpable LN or hepatosplenomegaly. No BLE edema on exam. I have reviewed his pathology from 2013 and this appears like a early stage prostate cancer and it is unlikely to metastasize to intra abdominal LN, however will need further work up. I have ordered some labs including CBC, PSA, CMP, LDH and reticulocyte count.  I will order PET imaging to look at the etiology of lymphadenopathy after discussing with radiologist if this is  considered reasonable, left a message with the radiology. Also given his smoking history, I wonder if this could be attributed to other malignancies, He will RTC in 2/3 weeks and we will discuss additional recommendations. Wife had several questions about genetic testing, she said all her brothers died from cancer and she has a niece with ovarian cancer who died in 89's. I encouraged her to discuss with her PCP about genetic testing and to follow all age appropriate cancer screening.  Thank you for consulting [place] in the care of this patient.  Please do not hesitate to contact us with any additional questions or concerns.  HISTORY OF PRESENTING ILLNESS:   Joseph Rangel 74 y.o. male is here because of some abnormal lymphadenopathy identified on CT angiogram especially given his history of prostate cancer.  He had robotic-assisted laparoscopic radical retropubic prostatectomy with bilateral pelvic lymph node dissection for a T1c prostate adenocarcinoma. Final pathology from this surgery showed prostatic adenocarcinoma , gleasons score 3+4=7, involving both lobes, no evidence of angiolymphatic invasion, extraprostatic extension or seminal vesicle involvement. All resection margins are clear.  CT showed periaortic, left iliac, peri rectal LAD concerning for metastatic disease, and hence referred to Oncology.  He no showed to his first appointment given lack of transport, and came an hr late for his second appointment. He is here with his wife, he tells me that some LN were enlarged on  a CT scan and his doctor wants to make sure he doesn't have cancer again. He denies any changes to his pain, has chronic back pain and pain in his legs for which he takes pain medication and FU with his PCP and pain management.  REVIEW OF SYSTEMS:   Constitutional: Denies fevers, chills or abnormal night sweats Eyes: Denies blurriness of vision, double vision or watery eyes Ears, nose, mouth, throat, and face: Denies  mucositis or sore throat Respiratory: Denies cough, dyspnea or wheezes Cardiovascular: Denies palpitation, chest discomfort or lower extremity swelling Gastrointestinal:  Denies nausea, heartburn or change in bowel habits Skin: Denies abnormal skin rashes Lymphatics: Denies new lymphadenopathy or easy bruising Neurological:Denies numbness, tingling or new weaknesses Behavioral/Psych: Mood is stable, no new changes  All other systems were reviewed with the patient and are negative.  MEDICAL HISTORY:  Past Medical History:  Diagnosis Date  . Arthritis   . Cancer [facility])    prostate   . Frequency of urination   . Hypertension   . Seizures ([place])    after brain surg - none in past 20 yrs  . 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 BRAIN SURGERY     BLOOD CLOT REMOVED 1980  . MASS EXCISION  02/24/2012   Procedure: MINOR EXCISION OF MASS;  Surgeon: Bernestine Amass, MD;  Location: [facility] OR[facility];  Service: Urology;;  excision of neck lesion  . ROBOT ASSISTED LAPAROSCOPIC RADICAL PROSTATECTOMY  02/24/2012   Procedure: ROBOTIC ASSISTED LAPAROSCOPIC RADICAL PROSTATECTOMY;  Surgeon: Bernestine Amass, MD;  Location: [facility];  Service: Urology;  Laterality: N/A;           SOCIAL HISTORY: Social History   Socioeconomic History  . Marital status: Married    Spouse name: Not on file  . Number of children: Not on file  . Years of education: Not on file  . Highest education level: Not on file  Occupational History  . Not on file  Tobacco Use  . Smoking status: Current Every Day Smoker    Packs/day: 0.50  Substance and Sexual Activity  . Alcohol use: No  . Drug use: No  . Sexual activity: Not on file  Other Topics Concern  . Not on file  Social History Narrative  . Not on file   Social Determinants of Health   Financial Resource Strain:   . Difficulty of Paying Living Expenses: Not on file  Food Insecurity:   .  Worried About Charity fundraiser in the Last Year: Not on file  . Ran Out of Food in the Last Year: Not on file  Transportation Needs:   . Lack of Transportation (Medical): Not on file  . Lack of Transportation (Non-Medical): Not on file  Physical Activity:   . Days of Exercise per Week: Not on file  . Minutes of Exercise per Session: Not on file  Stress:   . Feeling of Stress : Not on file  Social Connections:   . Frequency of Communication with Friends and Family: Not on file  . Frequency of Social Gatherings with Friends and Family: Not on file  . Attends Religious Services: Not on file  . Active Member of Clubs or Organizations: Not on file  . Attends Archivist Meetings: Not on file  . Marital Status: Not on file  Intimate Partner Violence:   . Fear of Current or Ex-Partner:  Not on file  . Emotionally Abused: Not on file  . Physically Abused: Not on file  . Sexually Abused: Not on file    FAMILY HISTORY: No family history on file.  ALLERGIES:  has No Known Allergies.  MEDICATIONS:  Current Outpatient Medications  Medication Sig Dispense Refill  . phenytoin (DILANTIN) 100 MG ER capsule Take 100-200 mg by mouth 2 (two) times daily. Takes 2 every morning and 1 at bedtime    . verapamil (CALAN) 120 MG tablet Take 120 mg by mouth daily with breakfast.     No current facility-administered medications for this visit.     PHYSICAL EXAMINATION: ECOG PERFORMANCE STATUS: 3 - Symptomatic, >50% confined to bed  Vitals:   08/09/20 1322  BP: 134/80  Pulse: 78  Resp: 20  Temp: 97.8 F (36.6 C)  SpO2: 100%   Filed Weights   08/09/20 1322  Weight: 163 lb 4.8 oz (74.1 kg)    GENERAL:alert, no distress, walks with a cane. SKIN: skin color, texture, turgor are normal, no rashes or significant lesions EYES: normal, conjunctiva are pink and non-injected, sclera clear NECK: supple, thyroid normal size, non-tender, without nodularity LYMPH:  no palpable  lymphadenopathy in the cervical, axillary or inguinal LUNGS: clear to auscultation and percussion with normal breathing effort HEART: regular rate & rhythm and no murmurs and no lower extremity edema ABDOMEN:abdomen soft, non-tender and normal bowel sounds Musculoskeletal:clubbing noted. Limited ROM, walks slowly had difficulty getting up on the exam table. PSYCH: alert & oriented x 3 with fluent speech NEURO: no focal motor/sensory deficits  LABORATORY DATA:  I have reviewed the data as listed Lab Results  Component Value Date   WBC 9.0 02/18/2012   HGB 11.6 (L) 02/25/2012   HCT 34.9 (L) 02/25/2012   MCV 88.8 02/18/2012   PLT 224 02/18/2012     Chemistry      Component Value Date/Time   NA 132 (L) 02/25/2012 0447   K 3.7 02/25/2012 0447   CL 101 02/25/2012 0447   CO2 24 02/25/2012 0447   BUN 8 02/25/2012 0447   CREATININE 0.80 02/25/2012 0447      Component Value Date/Time   CALCIUM 7.9 (L) 02/25/2012 0447      RADIOGRAPHIC STUDIES: I have personally reviewed the radiological images as listed and agreed with the findings in the report. CT ANGIO AO+BIFEM W & OR WO CONTRAST  Result Date: 07/11/2020 CLINICAL DATA:  74 year old male with history of abnormal ankle brachial indices and left leg pain. EXAM: CT ANGIOGRAPHY OF ABDOMINAL AORTA WITH ILIOFEMORAL RUNOFF TECHNIQUE: Multidetector CT imaging of the abdomen, pelvis and lower extremities was performed using the standard protocol during bolus administration of intravenous contrast. Multiplanar CT image reconstructions and MIPs were obtained to evaluate the vascular anatomy. CONTRAST:  175mL ISOVUE-370 IOPAMIDOL (ISOVUE-370) INJECTION 76% COMPARISON:  CT abdomen pelvis from 12/07/2013 FINDINGS: VASCULAR Aorta: Normal caliber and patent throughout. Scattered fibrofatty and calcific atherosclerotic changes, most prominent near the bifurcation without evidence of flow limiting stenosis. Celiac: Patent without evidence of aneurysm,  dissection, vasculitis or significant stenosis. SMA: Patent without evidence of aneurysm, dissection, vasculitis or significant stenosis. Of note there is a replaced right hepatic artery arising from the proximal SMA. Renals: Single bilateral renal arteries are patent without evidence of aneurysm, dissection, vasculitis, fibromuscular dysplasia or significant stenosis. IMA: Patent with mild proximal stenosis. RIGHT Lower Extremity Inflow: Common, internal and external iliac arteries are patent without evidence of aneurysm, dissection, vasculitis or significant stenosis. Scattered fibrofatty  and calcific atherosclerotic changes. Outflow: Common, superficial and profunda femoral arteries and the popliteal artery are patent without evidence of aneurysm, dissection, vasculitis or significant stenosis. Multifocal fibrofatty and calcific atherosclerotic changes with minimal multifocal luminal in right ocular disease, not likely to be hemodynamically significant. Runoff: The anterior tibial artery is patent the level of the foot. The TP trunk and posterior tibial artery are occluded throughout. The peroneal artery is diminutive but patent to the level of the ankle where there is a supra calcaneal collateral to the posterior tibial artery which then supplies the plantar arch. LEFT Lower Extremity Inflow: Common, internal and external iliac arteries are patent without evidence of aneurysm, dissection, vasculitis or significant stenosis. Scattered fibrofatty and calcific atherosclerotic changes Outflow: Common, superficial and profunda femoral arteries and the popliteal artery are patent without evidence of aneurysm, dissection, vasculitis or significant stenosis. Scattered fibrofatty and calcific atherosclerotic changes. Runoff: The anterior tibial artery is patent throughout. The TP trunk, proximal peroneal, and proximal posterior tibial arteries are diminutive proximally. The posterior tibial artery tapers to occlusion in  the distal lower leg. The peroneal artery is patent to the level of the ankle. Veins: No obvious venous abnormality within the limitations of this arterial phase study. Review of the MIP images confirms the above findings. NON-VASCULAR Lower chest: The lung bases are clear bilaterally. The heart is normal in size. No pericardial effusion. Hepatobiliary: The liver is normal in size, contour, and attenuation. No focal lesions. The gallbladder is present and unremarkable. No intra or extrahepatic biliary ductal dilation. Pancreas: Unremarkable. No pancreatic ductal dilatation or surrounding inflammatory changes. Spleen: Normal in size without focal abnormality. Adrenals/Urinary Tract: Adrenal glands are unremarkable. Kidneys are normal, without renal calculi, focal lesion, or hydronephrosis. Bladder is unremarkable. Stomach/Bowel: Stomach is within normal limits. Appendix appears normal. There are few scattered colonic diverticula. No evidence of bowel wall thickening, distention, or inflammatory changes. Lymphatic: Scattered prominent irregular appearing intra pelvic, inguinal, and periaortic lymph nodes, the largest about the left iliac measuring approximately 1.7 cm in short axis. Reproductive: Status post prostatectomy. Other: No abdominal wall hernia or abnormality. No abdominopelvic ascites. Musculoskeletal: Multilevel degenerative changes of the visualized thoracolumbar spine. IMPRESSION: VASCULAR 1. Two vessel bilateral lower extremity runoff configuration. Occlusion of the bilateral posterior tibial arteries with diminutive bilateral peroneal arteries. 2. Scattered fibrofatty and calcific atherosclerotic changes in the distal aorta, bilateral inflow, and bilateral outflow vessels without flow-limiting stenosis. Aortic Atherosclerosis (ICD10-I70.0). NON-VASCULAR Peri aortic, left iliac, and perirectal lymphadenopathy concerning for metastatic disease given history of prostate cancer. These results will be  called to the ordering clinician or representative by the Radiologist Assistant, and communication documented in the PACS or [employer]. Ruthann Cancer, MD [facility] Electronically Signed   By: Ruthann Cancer MD   On: 07/11/2020 12:31    All questions were answered. The patient knows to call the clinic with any problems, questions or concerns. I have spent a total of 45 minutes in the care of this patient including H and P< review of medical records, documentation, counseling and coordination of care.    Benay Pike, MD 08/09/2020 2:18 PM

## 2020-08-10 LAB — PSA, TOTAL AND FREE
PSA, Free Pct: 13.2 %
PSA, Free: 27.3 ng/mL
Prostate Specific Ag, Serum: 207 ng/mL — ABNORMAL HIGH (ref 0.0–4.0)

## 2020-08-12 ENCOUNTER — Telehealth: Payer: Self-pay | Admitting: Hematology and Oncology

## 2020-08-12 LAB — FOLATE RBC
Folate, Hemolysate: 322 ng/mL
Folate, RBC: 991 ng/mL (ref >498)
Hematocrit: 32.5 % — ABNORMAL LOW (ref 37.5–51.0)

## 2020-08-12 LAB — PROTEIN ELECTROPHORESIS, SERUM
A/G Ratio: 1.1 (ref 0.7–1.7)
Albumin ELP: 3.7 g/dL (ref 2.9–4.4)
Alpha-1-Globulin: 0.3 g/dL (ref 0.0–0.4)
Alpha-2-Globulin: 0.7 g/dL (ref 0.4–1.0)
Beta Globulin: 1.2 g/dL (ref 0.7–1.3)
Gamma Globulin: 1.2 g/dL (ref 0.4–1.8)
Globulin, Total: 3.4 g/dL (ref 2.2–3.9)
Total Protein ELP: 7.1 g/dL (ref 6.0–8.5)

## 2020-08-12 LAB — FERRITIN: Ferritin: 80 ng/mL (ref 24–336)

## 2020-08-12 LAB — IRON AND TIBC
Iron: 70 ug/dL (ref 42–163)
Saturation Ratios: 32 % (ref 20–55)
TIBC: 219 ug/dL (ref 202–409)
UIBC: 149 ug/dL (ref 117–376)

## 2020-08-12 NOTE — Telephone Encounter (Signed)
Scheduled appointment per 11/19 los. Spoke to patient who is aware of appointment date and time.  

## 2020-08-13 ENCOUNTER — Other Ambulatory Visit: Payer: Self-pay | Admitting: Hematology and Oncology

## 2020-08-13 DIAGNOSIS — R590 Localized enlarged lymph nodes: Secondary | ICD-10-CM

## 2020-08-13 DIAGNOSIS — R972 Elevated prostate specific antigen [PSA]: Secondary | ICD-10-CM

## 2020-08-13 DIAGNOSIS — Z8546 Personal history of malignant neoplasm of prostate: Secondary | ICD-10-CM

## 2020-08-13 NOTE — Progress Notes (Signed)
Discussed with Radiology, PET scan thought to be appropriate since current lymphadenopathy is not an easy site to biopsy. Patient agreeable to imaging.

## 2020-08-22 ENCOUNTER — Telehealth: Payer: Self-pay

## 2020-08-22 DIAGNOSIS — M792 Neuralgia and neuritis, unspecified: Secondary | ICD-10-CM | POA: Diagnosis not present

## 2020-08-22 DIAGNOSIS — G8929 Other chronic pain: Secondary | ICD-10-CM | POA: Diagnosis not present

## 2020-08-22 DIAGNOSIS — M1712 Unilateral primary osteoarthritis, left knee: Secondary | ICD-10-CM | POA: Diagnosis not present

## 2020-08-22 DIAGNOSIS — M5136 Other intervertebral disc degeneration, lumbar region: Secondary | ICD-10-CM | POA: Diagnosis not present

## 2020-08-22 DIAGNOSIS — F17210 Nicotine dependence, cigarettes, uncomplicated: Secondary | ICD-10-CM | POA: Diagnosis not present

## 2020-08-22 DIAGNOSIS — M545 Low back pain, unspecified: Secondary | ICD-10-CM | POA: Diagnosis not present

## 2020-08-22 NOTE — Telephone Encounter (Signed)
I have called and scheduled the pts PET scan for him. Pt has been scheduled for 09/02/20 at 12:30pm arrival time. Pt is to be NPO 6hrs before and to avoid carbs and  Sugars the night before. Pt to arrive to the [facility] Main Entrance for this appointment.  I called to relay this information and a woman answered indicating neither the pt or his wife were avail and to call back. Before I could ask to leave our call back number, she hung up. I did attempt to call back but there was no answer.

## 2020-08-23 NOTE — Telephone Encounter (Signed)
I spoke with pt and advised of his appt date/time for the PET scan. Pt was able to repeat the appointment information and instructions back to me twice and he indicated he wrote it down. Pt did not have questions and expressed understanding of this information.

## 2020-08-30 ENCOUNTER — Telehealth: Payer: Self-pay | Admitting: *Deleted

## 2020-08-30 NOTE — Telephone Encounter (Signed)
Received call from [facility] with Nuclear Medicine seeking to clarify PET order/type. She is asking if you want a "Pylarify PET' instead of a regular PET. Please return her call @ [phone]  Thank you

## 2020-09-02 ENCOUNTER — Other Ambulatory Visit: Payer: Self-pay

## 2020-09-02 ENCOUNTER — Encounter (HOSPITAL_COMMUNITY)
Admission: RE | Admit: 2020-09-02 | Discharge: 2020-09-02 | Disposition: A | Discharge status: Home / Self Care | Payer: Medicare HMO | Source: Ambulatory Visit | Attending: Hematology and Oncology | Admitting: Hematology and Oncology

## 2020-09-02 DIAGNOSIS — Z8546 Personal history of malignant neoplasm of prostate: Secondary | ICD-10-CM | POA: Diagnosis not present

## 2020-09-02 DIAGNOSIS — R972 Elevated prostate specific antigen [PSA]: Secondary | ICD-10-CM | POA: Diagnosis not present

## 2020-09-02 DIAGNOSIS — R590 Localized enlarged lymph nodes: Secondary | ICD-10-CM | POA: Diagnosis not present

## 2020-09-02 DIAGNOSIS — R599 Enlarged lymph nodes, unspecified: Secondary | ICD-10-CM | POA: Diagnosis not present

## 2020-09-02 LAB — GLUCOSE, CAPILLARY: Glucose-Capillary: 124 mg/dL — ABNORMAL HIGH (ref 70–99)

## 2020-09-02 MED ORDER — FLUDEOXYGLUCOSE F - 18 (FDG) INJECTION
8.6000 | Freq: Once | INTRAVENOUS | Status: AC | PRN
  Administered 2020-09-02: 8.6 via INTRAVENOUS

## 2020-09-04 ENCOUNTER — Inpatient Hospital Stay: Payer: Medicare HMO | Attending: Hematology and Oncology | Admitting: Hematology and Oncology

## 2020-09-04 ENCOUNTER — Ambulatory Visit: Payer: Medicare HMO | Admitting: Hematology and Oncology

## 2020-09-04 DIAGNOSIS — I10 Essential (primary) hypertension: Secondary | ICD-10-CM | POA: Insufficient documentation

## 2020-09-04 DIAGNOSIS — G40909 Epilepsy, unspecified, not intractable, without status epilepticus: Secondary | ICD-10-CM | POA: Insufficient documentation

## 2020-09-04 DIAGNOSIS — C61 Malignant neoplasm of prostate: Secondary | ICD-10-CM | POA: Insufficient documentation

## 2020-09-04 DIAGNOSIS — R32 Unspecified urinary incontinence: Secondary | ICD-10-CM | POA: Insufficient documentation

## 2020-09-04 DIAGNOSIS — M129 Arthropathy, unspecified: Secondary | ICD-10-CM | POA: Insufficient documentation

## 2020-09-04 DIAGNOSIS — F17210 Nicotine dependence, cigarettes, uncomplicated: Secondary | ICD-10-CM | POA: Insufficient documentation

## 2020-09-04 DIAGNOSIS — C778 Secondary and unspecified malignant neoplasm of lymph nodes of multiple regions: Secondary | ICD-10-CM | POA: Insufficient documentation

## 2020-09-04 DIAGNOSIS — M549 Dorsalgia, unspecified: Secondary | ICD-10-CM | POA: Insufficient documentation

## 2020-09-04 DIAGNOSIS — Z79899 Other long term (current) drug therapy: Secondary | ICD-10-CM | POA: Insufficient documentation

## 2020-09-04 NOTE — Progress Notes (Deleted)
[facility] CONSULT NOTE  Patient Care Team: Iona Beard, MD as PCP - General (Family Medicine)  CHIEF COMPLAINTS/PURPOSE OF CONSULTATION:  Abnormal imaging.  ASSESSMENT & PLAN:  No problem-specific Assessment & Plan notes found for this encounter.  No orders of the defined types were placed in this encounter.  This is a very pleasant 74 yr old male patient with PMH significant for prostate adenocarcinoma Gleason 3+4, no LN involvement s.p robotic prostatectomy in 2013, who had some imaging done for his CAD referred to hematology for further evaluation of irregular appearing intra pelvic, inguinal and periaortic LN, largest about the left iliac measuring approximately 1.7 cms. He denies any new complaints, has some urinary incontinence since the surgery and this hasn't changed. He has chronic back pain and LE pain, this hasnt changed. No new symptoms overall. PE, chronically ill appearing man, walks slowly with a cane given his PAD and severe LE pain. No palpable LN or hepatosplenomegaly. No BLE edema on exam. I have reviewed his pathology from 2013 and this appears like a early stage prostate cancer and it is unlikely to metastasize to intra abdominal LN, however will need further work up. I have ordered some labs including CBC, PSA, CMP, LDH and reticulocyte count.  Ordered PET imaging to find out if we can find a better site to biopsy, however not much FDG uptake although clinical picture is highly suggestive of prostate adenocarcinoma.  Thank you for consulting [place] in the care of this patient.  Please do not hesitate to contact us with any additional questions or concerns.  HISTORY OF PRESENTING ILLNESS:   Joseph Rangel 74 y.o. male is here because of some abnormal lymphadenopathy identified on CT angiogram especially given his history of prostate cancer.  He had robotic-assisted laparoscopic radical retropubic prostatectomy with bilateral pelvic lymph node dissection for  a T1c prostate adenocarcinoma. Final pathology from this surgery showed prostatic adenocarcinoma , gleasons score 3+4=7, involving both lobes, no evidence of angiolymphatic invasion, extraprostatic extension or seminal vesicle involvement. All resection margins are clear.  CT showed periaortic, left iliac, peri rectal LAD concerning for metastatic disease, and hence referred to Oncology.  He no showed to his first appointment given lack of transport, and came an hr late for his second appointment. He is here with his wife, he tells me that some LN were enlarged on a CT scan and his doctor wants to make sure he doesn't have cancer again. He denies any changes to his pain, has chronic back pain and pain in his legs for which he takes pain medication and FU with his PCP and pain management. PSA of 207, PET imaging with adenopathy in the abdomen and pelvis in this patient with elevated PSA showing low level FDG uptake most suggestive of prostate metastases. Other disease processes that could have this distribution rectal cancer and lymphoma. The lack of pronounced FDG uptake in these lymph nodes and the reported PSA level make these less likely.  He is here for FU.  REVIEW OF SYSTEMS:   Constitutional: Denies fevers, chills or abnormal night sweats Eyes: Denies blurriness of vision, double vision or watery eyes Ears, nose, mouth, throat, and face: Denies mucositis or sore throat Respiratory: Denies cough, dyspnea or wheezes Cardiovascular: Denies palpitation, chest discomfort or lower extremity swelling Gastrointestinal:  Denies nausea, heartburn or change in bowel habits Skin: Denies abnormal skin rashes Lymphatics: Denies new lymphadenopathy or easy bruising Neurological:Denies numbness, tingling or new weaknesses Behavioral/Psych: Mood is stable,  no new changes  All other systems were reviewed with the patient and are negative.  MEDICAL HISTORY:  Past Medical History:  Diagnosis Date  .  Arthritis   . Cancer [facility])    prostate   . Frequency of urination   . Hypertension   . Seizures ([place])    after brain surg - none in past 20 yrs  . 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 BRAIN SURGERY     BLOOD CLOT REMOVED 1980  . MASS EXCISION  02/24/2012   Procedure: MINOR EXCISION OF MASS;  Surgeon: Bernestine Amass, MD;  Location: [facility];  Service: Urology;;  excision of neck lesion  . ROBOT ASSISTED LAPAROSCOPIC RADICAL PROSTATECTOMY  02/24/2012   Procedure: ROBOTIC ASSISTED LAPAROSCOPIC RADICAL PROSTATECTOMY;  Surgeon: Bernestine Amass, MD;  Location: [facility];  Service: Urology;  Laterality: N/A;           SOCIAL HISTORY: Social History   Socioeconomic History  . Marital status: Married    Spouse name: Not on file  . Number of children: Not on file  . Years of education: Not on file  . Highest education level: Not on file  Occupational History  . Not on file  Tobacco Use  . Smoking status: Current Every Day Smoker    Packs/day: 0.50  . Smokeless tobacco: Never Used  Substance and Sexual Activity  . Alcohol use: No  . Drug use: No  . Sexual activity: Not on file  Other Topics Concern  . Not on file  Social History Narrative  . Not on file   Social Determinants of Health   Financial Resource Strain: Not on file  Food Insecurity: Not on file  Transportation Needs: Not on file  Physical Activity: Not on file  Stress: Not on file  Social Connections: Not on file  Intimate Partner Violence: Not on file    FAMILY HISTORY: No family history on file.  ALLERGIES:  has No Known Allergies.  MEDICATIONS:  Current Outpatient Medications  Medication Sig Dispense Refill  . phenytoin (DILANTIN) 100 MG ER capsule Take 100-200 mg by mouth 2 (two) times daily. Takes 2 every morning and 1 at bedtime    . verapamil (CALAN) 120 MG tablet Take 120 mg by mouth daily with breakfast.     No current  facility-administered medications for this visit.     PHYSICAL EXAMINATION: ECOG PERFORMANCE STATUS: 3 - Symptomatic, >50% confined to bed  There were no vitals filed for this visit. There were no vitals filed for this visit.  GENERAL:alert, no distress, walks with a cane. SKIN: skin color, texture, turgor are normal, no rashes or significant lesions EYES: normal, conjunctiva are pink and non-injected, sclera clear NECK: supple, thyroid normal size, non-tender, without nodularity LYMPH:  no palpable lymphadenopathy in the cervical, axillary or inguinal LUNGS: clear to auscultation and percussion with normal breathing effort HEART: regular rate & rhythm and no murmurs and no lower extremity edema ABDOMEN:abdomen soft, non-tender and normal bowel sounds Musculoskeletal:clubbing noted. Limited ROM, walks slowly had difficulty getting up on the exam table. PSYCH: alert & oriented x 3 with fluent speech NEURO: no focal motor/sensory deficits  LABORATORY DATA:  I have reviewed the data as listed Lab Results  Component Value Date   WBC 9.0 08/09/2020   HGB 10.7 (L) 08/09/2020   HCT 32.9 (L) 08/09/2020   HCT 32.5 (L) 08/09/2020  MCV 91.4 08/09/2020   PLT 236 08/09/2020     Chemistry      Component Value Date/Time   NA 138 08/09/2020 1415   K 3.9 08/09/2020 1415   CL 105 08/09/2020 1415   CO2 25 08/09/2020 1415   BUN 21 08/09/2020 1415   CREATININE 1.44 (H) 08/09/2020 1415      Component Value Date/Time   CALCIUM 9.2 08/09/2020 1415   ALKPHOS 138 (H) 08/09/2020 1415   AST 14 (L) 08/09/2020 1415   ALT 11 08/09/2020 1415   BILITOT 0.3 08/09/2020 1415      RADIOGRAPHIC STUDIES: I have personally reviewed the radiological images as listed and agreed with the findings in the report. NM PET Image Initial (PI) Skull Base To Thigh  Result Date: 09/03/2020 CLINICAL DATA:  Initial treatment strategy for adenopathy in 74 year old gentleman with history of prostate cancer  discovered on recent imaging, PSA of 207. EXAM: NUCLEAR MEDICINE PET SKULL BASE TO THIGH TECHNIQUE: 8.6 mCi F-18 FDG was injected intravenously. Full-ring PET imaging was performed from the skull base to thigh after the radiotracer. CT data was obtained and used for attenuation correction and anatomic localization. Fasting blood glucose: 124 mg/dl COMPARISON:  07/11/2020 FINDINGS: Mediastinal blood pool activity: SUV max 2.21 Liver activity: SUV max NA NECK: No hypermetabolic lymph nodes in the neck. Incidental CT findings: Signs of remote RIGHT MCA infarct, also seen on prior PET imaging CHEST: No hypermetabolic mediastinal or hilar nodes. No suspicious pulmonary nodules on the CT scan. Incidental CT findings: Calcified atheromatous plaque in the thoracic aorta. Normal heart size. No pericardial effusion. Normal caliber central pulmonary vasculature. Limited assessment of cardiovascular structures given lack of intravenous contrast. Scattered lymph nodes throughout the chest without pathologic enlargement or hypermetabolic features. Mild basilar atelectasis. Lung parenchyma with limited assessment due to respiratory motion. No consolidation or effusion. ABDOMEN/PELVIS: Bulky adenopathy in the retroperitoneum. Pre aortic lymph node approximately 15 mm within 1 mm of previous size (image 123, series 4) (SUVmax = 2.8) Another preaortic lymph node on image 131 of series 4 measures 13 mm short axis also stable with respect to size with a maximum SUV of approximately 2.4. Additional lymph nodes in retroperitoneum within 1-2 mm of previous measurements. Lymph nodes track into the sigmoid mesentery. Nodal group/soft tissue along the superior rectal vein (image 161, series 4) 1.9 cm stable as measured by this observer on the prior study displaying a maximum SUV of 2.2. Other superior rectal and mesorectal lymph nodes showing no change. No solid organ uptake. Incidental CT findings: Colonic diverticulosis. No acute  intra-abdominal process. Post prostatectomy. Vascular calcifications without aneurysmal dilation. SKELETON: Spinal degenerative changes as before, increased since the prior study IMPRESSION: Adenopathy in the abdomen and pelvis in this patient with elevated PSA showing low level FDG uptake most suggestive of prostate metastases. Other disease processes that could have this distribution rectal cancer and lymphoma. The lack of pronounced FDG uptake in these lymph nodes and the reported PSA level make these less likely. For added specificity PSMA PET could be considered as clinically warranted. Correlate with recent colonoscopy results if available given the pattern of disease in the sigmoid mesentery and in the retroperitoneum as is pattern could be seen in the setting of rectal cancer though again is less likely given the limited FDG uptake and the clinical history provided. Aortic Atherosclerosis (ICD10-I70.0). Electronically Signed   By: Zetta Bills M.D.   On: 09/03/2020 08:19    All questions were answered. The  patient knows to call the clinic with any problems, questions or concerns. I have spent a total of 45 minutes in the care of this patient including H and P< review of medical records, documentation, counseling and coordination of care.    Benay Pike, MD 09/04/2020 1:49 PM

## 2020-09-05 ENCOUNTER — Other Ambulatory Visit: Payer: Self-pay | Admitting: *Deleted

## 2020-09-05 ENCOUNTER — Telehealth: Payer: Self-pay

## 2020-09-05 DIAGNOSIS — M79605 Pain in left leg: Secondary | ICD-10-CM

## 2020-09-05 NOTE — Telephone Encounter (Signed)
Patient did not show up for 2:00 appointment on 09/04/20. Called patient's home number and no answer. Called patient's daughter who is listed as a contact and the number is not in service. Will continue to try and contact patient to reschedule appointments.

## 2020-09-11 ENCOUNTER — Telehealth: Payer: Self-pay

## 2020-09-11 NOTE — Telephone Encounter (Signed)
Patient did not show up for his appointment on 09/04/20. Dr. Chryl Heck requested that I try and reach out to the patient to get his appointment rescheduled. I called the patient's home number and spoke to a family friend, "Barbaraann Rondo". Barbaraann Rondo confirmed that the patient was unable to speak at the present time. I asked Barbaraann Rondo to inform the patient to call us back when he had the time. I also tried to contact the patient's daughter whose number is listed in Epic but the call would no go through.

## 2020-09-12 ENCOUNTER — Telehealth: Payer: Self-pay | Admitting: Hematology and Oncology

## 2020-09-12 NOTE — Telephone Encounter (Signed)
Rescheduled follow-up visit per 12/23 schedule message. Patient is aware.

## 2020-09-16 ENCOUNTER — Other Ambulatory Visit: Payer: Self-pay

## 2020-09-16 ENCOUNTER — Encounter: Payer: Self-pay | Admitting: Hematology and Oncology

## 2020-09-16 ENCOUNTER — Inpatient Hospital Stay: Payer: Medicare HMO | Admitting: Hematology and Oncology

## 2020-09-16 VITALS — BP 156/88 | HR 83 | Temp 97.0°F | Resp 18 | Ht 69.0 in | Wt 161.7 lb

## 2020-09-16 DIAGNOSIS — F17210 Nicotine dependence, cigarettes, uncomplicated: Secondary | ICD-10-CM | POA: Diagnosis not present

## 2020-09-16 DIAGNOSIS — M549 Dorsalgia, unspecified: Secondary | ICD-10-CM | POA: Diagnosis not present

## 2020-09-16 DIAGNOSIS — R32 Unspecified urinary incontinence: Secondary | ICD-10-CM | POA: Diagnosis not present

## 2020-09-16 DIAGNOSIS — Z79899 Other long term (current) drug therapy: Secondary | ICD-10-CM | POA: Diagnosis not present

## 2020-09-16 DIAGNOSIS — M129 Arthropathy, unspecified: Secondary | ICD-10-CM | POA: Diagnosis not present

## 2020-09-16 DIAGNOSIS — C778 Secondary and unspecified malignant neoplasm of lymph nodes of multiple regions: Secondary | ICD-10-CM | POA: Diagnosis not present

## 2020-09-16 DIAGNOSIS — G40909 Epilepsy, unspecified, not intractable, without status epilepticus: Secondary | ICD-10-CM | POA: Diagnosis not present

## 2020-09-16 DIAGNOSIS — R972 Elevated prostate specific antigen [PSA]: Secondary | ICD-10-CM | POA: Diagnosis not present

## 2020-09-16 DIAGNOSIS — I10 Essential (primary) hypertension: Secondary | ICD-10-CM | POA: Diagnosis not present

## 2020-09-16 DIAGNOSIS — R590 Localized enlarged lymph nodes: Secondary | ICD-10-CM | POA: Diagnosis not present

## 2020-09-16 DIAGNOSIS — C61 Malignant neoplasm of prostate: Secondary | ICD-10-CM | POA: Diagnosis not present

## 2020-09-16 NOTE — Progress Notes (Addendum)
[facility] CONSULT NOTE  Patient Care Team: [name], MD as PCP - General (Family Medicine)  CHIEF COMPLAINTS/PURPOSE OF CONSULTATION:  Abnormal imaging.  ASSESSMENT & PLAN:  No problem-specific Assessment & Plan notes found for this encounter.  No orders of the defined types were placed in this encounter.  This is a very pleasant 74 yr old male patient with PMH significant for prostate adenocarcinoma Gleason 3+4, no LN involvement s.p robotic prostatectomy in 2013, who had some imaging done for his CAD referred to hematology for further evaluation of irregular appearing intra pelvic, inguinal and periaortic LN, largest about the left iliac measuring approximately 1.7 cms. He denied any new complaints, has some urinary incontinence since the surgery and this hasn't changed. He has chronic back pain and LE pain, this hasnt changed. No new symptoms overall. Initial PE, chronically ill appearing man, walks slowly with a cane given his PAD and severe LE pain. No palpable LN or hepatosplenomegaly. No BLE edema on exam. I have reviewed his pathology from 2013 and this appears like a early stage prostate cancer. PSA of 207, PET imaging with adenopathy in the abdomen and pelvis in this patient with elevated PSA showing low level FDG uptake most suggestive of prostate metastases. Other disease processes that could have this distribution include rectal cancer and lymphoma. The lack of pronounced FDG uptake in these lymph nodes and the reported PSA level make these less likely. He is here for FU. I have recommended a biopsy of the lymph nodes to establish diagnosis since isolated retroperitoneal LN spread is not a common presentation for prostate cancer. We will however wait for the biopsy to be resulted before initiation of any kind of treatment. Will most likely consider ADT with zytiga If diagnosis is confirmed as prostate adenoca Sent a referral to surgery team if there is any  additional role for surgery.  Thank you for consulting [place] in the care of this patient.  Please do not hesitate to contact us with any additional questions or concerns.  HISTORY OF PRESENTING ILLNESS:   Joseph Rangel 74 y.o. male is here because of some abnormal lymphadenopathy identified on CT angiogram especially given his history of prostate cancer.  He had robotic-assisted laparoscopic radical retropubic prostatectomy with bilateral pelvic lymph node dissection for a T1c prostate adenocarcinoma. Final pathology from this surgery showed prostatic adenocarcinoma , gleasons score 3+4=7, involving both lobes, no evidence of angiolymphatic invasion, extraprostatic extension or seminal vesicle involvement. All resection margins are clear.  CT showed periaortic, left iliac, peri rectal LAD concerning for metastatic disease, and hence referred to Oncology.  He no showed to his first appointment given lack of transport, and came an hr late for his second appointment. He is here with his wife, he tells me that some LN were enlarged on a CT scan and his doctor wants to make sure he doesn't have cancer again. He denies any changes to his pain, has chronic back pain and pain in his legs for which he takes pain medication and FU with his PCP and pain management. PSA of 207, PET imaging with adenopathy in the abdomen and pelvis in this patient with elevated PSA showing low level FDG uptake most suggestive of prostate metastases. Other disease processes that could have this distribution rectal cancer and lymphoma. The lack of pronounced FDG uptake in these lymph nodes and the reported PSA level make these less likely.  He is here for FU.  I have ordered a  biopsy of his lymph nodes to establish metastatic prostate cancer diagnosis. He denies any complaints with urination or new onset back pain.   REVIEW OF SYSTEMS:   Constitutional: Denies fevers, chills or abnormal night sweats Eyes: Denies blurriness  of vision, double vision or watery eyes Ears, nose, mouth, throat, and face: Denies mucositis or sore throat Respiratory: Denies cough, dyspnea or wheezes Cardiovascular: Denies palpitation, chest discomfort or lower extremity swelling Gastrointestinal:  Denies nausea, heartburn or change in bowel habits Skin: Denies abnormal skin rashes Lymphatics: Denies new lymphadenopathy or easy bruising Neurological:Denies numbness, tingling or new weaknesses Behavioral/Psych: Mood is stable, no new changes  All other systems were reviewed with the patient and are negative.  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    SURGICAL HISTORY: Past Surgical History:  Procedure Laterality Date   BRAIN SURGERY     BLOOD CLOT REMOVED 1980   MASS EXCISION  02/24/2012   Procedure: MINOR EXCISION OF MASS;  Surgeon: Bernestine Amass, MD;  Location: [facility];  Service: Urology;;  excision of neck lesion   ROBOT ASSISTED LAPAROSCOPIC RADICAL PROSTATECTOMY  02/24/2012   Procedure: ROBOTIC ASSISTED LAPAROSCOPIC RADICAL PROSTATECTOMY;  Surgeon: Bernestine Amass, MD;  Location: [facility];  Service: Urology;  Laterality: N/A;           SOCIAL HISTORY: Social History   Socioeconomic History   Marital status: Married    Spouse name: Not on file   Number of children: Not on file   Years of education: Not on file   Highest education level: Not on file  Occupational History   Not on file  Tobacco Use   Smoking status: Current Every Day Smoker    Packs/day: 0.50   Smokeless tobacco: Never Used  Substance and Sexual Activity   Alcohol use: No   Drug use: No   Sexual activity: Not on file  Other Topics Concern   Not on file  Social History Narrative   Not on file   Social Determinants of  Health   Financial Resource Strain: Not on file  Food Insecurity: Not on file  Transportation Needs: Not on file  Physical Activity: Not on file  Stress: Not on file  Social Connections: Not on file  Intimate Partner Violence: Not on file    FAMILY HISTORY: No family history on file.  ALLERGIES:  has No Known Allergies.  MEDICATIONS:  Current Outpatient Medications  Medication Sig Dispense Refill   phenytoin (DILANTIN) 100 MG ER capsule Take 100-200 mg by mouth 2 (two) times daily. Takes 2 every morning and 1 at bedtime     verapamil (CALAN) 120 MG tablet Take 120 mg by mouth daily with breakfast.     No current facility-administered medications for this visit.     PHYSICAL EXAMINATION: ECOG PERFORMANCE STATUS: 3 - Symptomatic, >50% confined to bed  There were no vitals filed for this visit. There were no vitals filed for this visit.  GENERAL:alert, no distress, walks with a cane. SKIN: skin color, texture, turgor are normal, no rashes or significant lesions EYES: normal, conjunctiva are pink and non-injected, sclera clear NECK: supple, thyroid normal size, non-tender, without nodularity LYMPH:  no palpable  lymphadenopathy in the cervical, axillary or inguinal LUNGS: clear to auscultation and percussion with normal breathing effort HEART: regular rate & rhythm and no murmurs and no lower extremity edema ABDOMEN:abdomen soft, non-tender and normal bowel sounds Musculoskeletal:clubbing noted. Limited ROM, walks slowly had difficulty getting up on the exam table. PSYCH: alert & oriented x 3 with fluent speech NEURO: no focal motor/sensory deficits  LABORATORY DATA:  I have reviewed the data as listed Lab Results  Component Value Date   WBC 9.0 08/09/2020   HGB 10.7 (L) 08/09/2020   HCT 32.9 (L) 08/09/2020   HCT 32.5 (L) 08/09/2020   MCV 91.4 08/09/2020   PLT 236 08/09/2020     Chemistry      Component Value Date/Time   NA 138 08/09/2020 1415   K 3.9  08/09/2020 1415   CL 105 08/09/2020 1415   CO2 25 08/09/2020 1415   BUN 21 08/09/2020 1415   CREATININE 1.44 (H) 08/09/2020 1415      Component Value Date/Time   CALCIUM 9.2 08/09/2020 1415   ALKPHOS 138 (H) 08/09/2020 1415   AST 14 (L) 08/09/2020 1415   ALT 11 08/09/2020 1415   BILITOT 0.3 08/09/2020 1415      RADIOGRAPHIC STUDIES: I have personally reviewed the radiological images as listed and agreed with the findings in the report. NM PET Image Initial (PI) Skull Base To Thigh  Result Date: 09/03/2020 CLINICAL DATA:  Initial treatment strategy for adenopathy in 74 year old gentleman with history of prostate cancer discovered on recent imaging, PSA of 207. EXAM: NUCLEAR MEDICINE PET SKULL BASE TO THIGH TECHNIQUE: 8.6 mCi F-18 FDG was injected intravenously. Full-ring PET imaging was performed from the skull base to thigh after the radiotracer. CT data was obtained and used for attenuation correction and anatomic localization. Fasting blood glucose: 124 mg/dl COMPARISON:  07/11/2020 FINDINGS: Mediastinal blood pool activity: SUV max 2.21 Liver activity: SUV max NA NECK: No hypermetabolic lymph nodes in the neck. Incidental CT findings: Signs of remote RIGHT MCA infarct, also seen on prior PET imaging CHEST: No hypermetabolic mediastinal or hilar nodes. No suspicious pulmonary nodules on the CT scan. Incidental CT findings: Calcified atheromatous plaque in the thoracic aorta. Normal heart size. No pericardial effusion. Normal caliber central pulmonary vasculature. Limited assessment of cardiovascular structures given lack of intravenous contrast. Scattered lymph nodes throughout the chest without pathologic enlargement or hypermetabolic features. Mild basilar atelectasis. Lung parenchyma with limited assessment due to respiratory motion. No consolidation or effusion. ABDOMEN/PELVIS: Bulky adenopathy in the retroperitoneum. Pre aortic lymph node approximately 15 mm within 1 mm of previous size  (image 123, series 4) (SUVmax = 2.8) Another preaortic lymph node on image 131 of series 4 measures 13 mm short axis also stable with respect to size with a maximum SUV of approximately 2.4. Additional lymph nodes in retroperitoneum within 1-2 mm of previous measurements. Lymph nodes track into the sigmoid mesentery. Nodal group/soft tissue along the superior rectal vein (image 161, series 4) 1.9 cm stable as measured by this observer on the prior study displaying a maximum SUV of 2.2. Other superior rectal and mesorectal lymph nodes showing no change. No solid organ uptake. Incidental CT findings: Colonic diverticulosis. No acute intra-abdominal process. Post prostatectomy. Vascular calcifications without aneurysmal dilation. SKELETON: Spinal degenerative changes as before, increased since the prior study IMPRESSION: Adenopathy in the abdomen and pelvis in this patient with elevated PSA showing low level FDG uptake most suggestive of prostate metastases. Other disease processes that could have this  distribution rectal cancer and lymphoma. The lack of pronounced FDG uptake in these lymph nodes and the reported PSA level make these less likely. For added specificity PSMA PET could be considered as clinically warranted. Correlate with recent colonoscopy results if available given the pattern of disease in the sigmoid mesentery and in the retroperitoneum as is pattern could be seen in the setting of rectal cancer though again is less likely given the limited FDG uptake and the clinical history provided. Aortic Atherosclerosis (ICD10-I70.0). Electronically Signed   By: Zetta Bills M.D.   On: 09/03/2020 08:19    All questions were answered. The patient knows to call the clinic with any problems, questions or concerns. I have spent a total of 30 minutes in the care of this patient including H and P< review of medical records, documentation, counseling and coordination of care.    Benay Pike, MD 09/16/2020  12:18 PM

## 2020-09-17 ENCOUNTER — Telehealth: Payer: Self-pay | Admitting: Hematology and Oncology

## 2020-09-17 ENCOUNTER — Encounter (HOSPITAL_COMMUNITY): Payer: Self-pay | Admitting: Radiology

## 2020-09-17 NOTE — Telephone Encounter (Signed)
MRN: [id number] Faxed referral and medical records to [facility] @ fax# [phone]

## 2020-09-17 NOTE — Progress Notes (Signed)
           Joseph Rangel Male, 74 y.o., [birth year]  MRN:  [id number] Phone:  [phone] (H)       PCP:  [name], MD Coverage:  [facility] Medicare/[employer] Hmo  Next Appt With Cardiology 09/24/2020 at 1:00 PM           RE: CT Biopsy Received: Today Suttle, Thressa Sheller, MD  [name]  Approved for CT guided retroperitoneal lymph node biopsy - best targets on PET CT from 09/02/20 series 4 image 123.   [name]        Previous Messages   ----- Message -----  From: [name]  Sent: 09/16/2020  5:49 PM EST  To: Ir Procedure Requests  Subject: CT Biopsy                     Procedure:  CT Biopsy   Reason: Lymphadenopathy, abdominal, Elevated PSA, Concern for metastatic prostate cancer, discussed with the radiologist, CT biopsy, abdominal lymphadenopathy, talked to Dr Deanne Coffer, history of prostate cancer, PSA 207   History: CT, NM PET in computer   Provider: Rachel Moulds   Provider Contact: [phone]    [name] on vacation this week.

## 2020-09-17 NOTE — Telephone Encounter (Signed)
Scheduled appointment per 12/27 los. Forwarded referral to HIM for faxing. Spoke to patient who is aware of appointment date and time.

## 2020-09-24 ENCOUNTER — Ambulatory Visit (HOSPITAL_COMMUNITY)
Admission: RE | Admit: 2020-09-24 | Discharge: 2020-09-24 | Disposition: A | Discharge status: Home / Self Care | Payer: Medicare HMO | Source: Ambulatory Visit | Attending: Vascular Surgery | Admitting: Vascular Surgery

## 2020-09-24 ENCOUNTER — Ambulatory Visit (INDEPENDENT_AMBULATORY_CARE_PROVIDER_SITE_OTHER): Payer: Medicare HMO | Admitting: Vascular Surgery

## 2020-09-24 ENCOUNTER — Encounter: Payer: Self-pay | Admitting: Vascular Surgery

## 2020-09-24 ENCOUNTER — Other Ambulatory Visit: Payer: Self-pay | Admitting: Student

## 2020-09-24 ENCOUNTER — Other Ambulatory Visit: Payer: Self-pay

## 2020-09-24 VITALS — BP 145/81 | HR 71 | Temp 97.7°F | Resp 20 | Ht 69.0 in | Wt 161.0 lb

## 2020-09-24 DIAGNOSIS — I739 Peripheral vascular disease, unspecified: Secondary | ICD-10-CM

## 2020-09-24 DIAGNOSIS — M79605 Pain in left leg: Secondary | ICD-10-CM | POA: Insufficient documentation

## 2020-09-24 NOTE — Progress Notes (Signed)
ASSESSMENT & PLAN:  75 y.o. male with disequilibrium and antalgic gait setting of moderate peripheral arterial disease.  He does not have symptoms typical of intermittent claudication or ischemic rest pain.  His main complaint is numbness of the right foot.  Recommend the following which can slow the progression of atherosclerosis and reduce the risk of major adverse cardiac / limb events:  Complete cessation from all tobacco products. Blood glucose control with goal A1c < 7%. Blood pressure control with goal blood pressure < 140/90 mmHg. Lipid reduction therapy with goal LDL-C <100 mg/dL (<70 if symptomatic from PAD).  Adequate hydration (at least 2 liters / day) if patient's heart and kidney function is adequate. Aspirin 81mg  PO QD.  Atorvastatin 40-80mg  PO QD (or other "high intensity" statin therapy).  Follow-up with PA in 6 months with repeat ABI  CHIEF COMPLAINT:   Disequilibrium  HISTORY:  HISTORY OF PRESENT ILLNESS: Joseph Rangel is a 75 y.o. male referred to clinic for evaluation of peripheral arterial disease.  The patient reports trouble walking for many months.  He has an unsteadiness on his feet and feels like his leg "gives out" while he walks.  He specifically denies any pain in his lower extremities.  He does report some numbness in the right foot and leg.  He has no ulceration about his leg.  Past Medical History:  Diagnosis Date  . Arthritis   . Cancer [facility])    prostate   . Frequency of urination   . Hypertension   . Seizures ([place])    after brain surg - none in past 20 yrs  . Speech disorder    due to brain surg    Past Surgical History:  Procedure Laterality Date  . BRAIN SURGERY     BLOOD CLOT REMOVED 1980  . MASS EXCISION  02/24/2012   Procedure: MINOR EXCISION OF MASS;  Surgeon: Bernestine Amass, MD;  Location: [facility] ORS;  Service: Urology;;  excision of neck lesion  . ROBOT ASSISTED LAPAROSCOPIC RADICAL PROSTATECTOMY  02/24/2012   Procedure: ROBOTIC  ASSISTED LAPAROSCOPIC RADICAL PROSTATECTOMY;  Surgeon: Bernestine Amass, MD;  Location: [facility];  Service: Urology;  Laterality: N/A;           History reviewed. No pertinent family history.  Social History   Socioeconomic History  . Marital status: Married    Spouse name: Not on file  . Number of children: Not on file  . Years of education: Not on file  . Highest education level: Not on file  Occupational History  . Not on file  Tobacco Use  . Smoking status: Current Every Day Smoker    Types: Cigarettes  . Smokeless tobacco: Never Used  . Tobacco comment: 2 cigarettes per day  Vaping Use  . Vaping Use: Never used  Substance and Sexual Activity  . Alcohol use: No  . Drug use: No  . Sexual activity: Not on file  Other Topics Concern  . Not on file  Social History Narrative  . Not on file   Social Determinants of Health   Financial Resource Strain: Not on file  Food Insecurity: Not on file  Transportation Needs: Not on file  Physical Activity: Not on file  Stress: Not on file  Social Connections: Not on file  Intimate Partner Violence: Not on file    No Known Allergies  Current Outpatient Medications  Medication Sig Dispense Refill  . atenolol (TENORMIN) 25 MG tablet Take 25 mg by mouth  daily.    . cilostazol (PLETAL) 50 MG tablet Take 50 mg by mouth daily.    . fenofibrate (TRICOR) 48 MG tablet Take 48 mg by mouth daily.    . hydrochlorothiazide (HYDRODIURIL) 25 MG tablet Take 25 mg by mouth daily.    Marland Kitchen HYDROcodone-acetaminophen (NORCO) 10-325 MG tablet Take 1 tablet by mouth every 4 (four) hours as needed for moderate pain.    Marland Kitchen latanoprost (XALATAN) 0.005 % ophthalmic solution Place 1 drop into both eyes every other day. At night    . meloxicam (MOBIC) 15 MG tablet Take 15 mg by mouth daily.    Marland Kitchen olmesartan (BENICAR) 40 MG tablet Take 40 mg by mouth daily.    . phenytoin (DILANTIN) 100 MG ER capsule Take 300 mg by mouth daily.    . verapamil (VERELAN PM) 360  MG 24 hr capsule Take 360 mg by mouth daily.     No current facility-administered medications for this visit.    REVIEW OF SYSTEMS:  [X]  denotes positive finding, [ ]  denotes negative finding Cardiac  Comments:  Chest pain or chest pressure:    Shortness of breath upon exertion:    Short of breath when lying flat:    Irregular heart rhythm:        Vascular    Pain in calf, thigh, or hip brought on by ambulation: x   Pain in feet at night that wakes you up from your sleep:     Blood clot in your veins:    Leg swelling:         Pulmonary    Oxygen at home:    Productive cough:     Wheezing:         Neurologic    Sudden weakness in arms or legs:  x   Sudden numbness in arms or legs:     Sudden onset of difficulty speaking or slurred speech:    Temporary loss of vision in one eye:     Problems with dizziness:         Gastrointestinal    Blood in stool:     Vomited blood:         Genitourinary    Burning when urinating:     Blood in urine:        Psychiatric    Major depression:         Hematologic    Bleeding problems:    Problems with blood clotting too easily:        Skin    Rashes or ulcers:        Constitutional    Fever or chills:     PHYSICAL EXAM:   Vitals:   09/24/20 1348  BP: (!) 145/81  Pulse: 71  Resp: 20  Temp: 97.7 F (36.5 C)  SpO2: 98%  Weight: 161 lb (73 kg)  Height: 5\' 9"  (1.753 m)   Constitutional: Well appearing in no distress. Appears well nourished.  Neurologic: Normal gait and station. CN intact. No weakness. No sensory loss. Psychiatric: Mood and affect symmetric and appropriate. Eyes: No icterus. No conjunctival pallor. Ears, nose, throat: mucous membranes moist. Midline trachea.  Cardiac: regular rate and rhythm.  Respiratory: unlabored. Abdominal: soft, non-tender, non-distended. Peripheral vascular:  No palpable pedal pulses Extremity: No edema. No cyanosis. No pallor.  Skin: No gangrene. No ulceration.  Lymphatic: No  Stemmer's sign. No palpable lymphadenopathy.   DATA REVIEW:    Most recent CBC CBC Latest Ref Rng & Units  08/09/2020 08/09/2020 02/25/2012  WBC 4.0 - 10.5 K/uL 9.0 - -  Hemoglobin 13.0 - 17.0 g/dL 10.7(L) - 11.6(L)  Hematocrit 37.5 - 51.0 % 32.9(L) 32.5(L) 34.9(L)  Platelets 150 - 400 K/uL 236 - -     Most recent CMP CMP Latest Ref Rng & Units 08/09/2020 02/25/2012 02/24/2012  Glucose 70 - 99 mg/dL 111(H) 127(H) 148(H)  BUN 8 - 23 mg/dL 21 8 10   Creatinine 0.61 - 1.24 mg/dL 1.44(H) 0.80 0.76  Sodium 135 - 145 mmol/L 138 132(L) 134(L)  Potassium 3.5 - 5.1 mmol/L 3.9 3.7 3.8  Chloride 98 - 111 mmol/L 105 101 101  CO2 22 - 32 mmol/L 25 24 24   Calcium 8.9 - 10.3 mg/dL 9.2 7.9(L) 8.2(L)  Total Protein 6.5 - 8.1 g/dL 7.9 - -  Total Bilirubin 0.3 - 1.2 mg/dL 0.3 - -  Alkaline Phos 38 - 126 U/L 138(H) - -  AST 15 - 41 U/L 14(L) - -  ALT 0 - 44 U/L 11 - -    Renal function CrCl cannot be calculated (Patient's most recent lab result is older than the maximum 21 days allowed.).  No results found for: HGBA1C  No results found for: LDLCALC, LDLC, HIRISKLDL, POCLDL, LDLDIRECT, REALLDLC, TOTLDLC   Vascular Imaging: ABI 09/24/2020    Yevonne Aline. Stanford Breed, MD [facility] Phone Number: [phone] 09/24/2020 4:47 PM

## 2020-09-25 ENCOUNTER — Other Ambulatory Visit: Payer: Self-pay

## 2020-09-25 ENCOUNTER — Ambulatory Visit (HOSPITAL_COMMUNITY)
Admission: RE | Admit: 2020-09-25 | Discharge: 2020-09-25 | Disposition: A | Discharge status: Home / Self Care | Payer: Medicare HMO | Source: Ambulatory Visit | Attending: Hematology and Oncology | Admitting: Hematology and Oncology

## 2020-09-25 DIAGNOSIS — I739 Peripheral vascular disease, unspecified: Secondary | ICD-10-CM

## 2020-09-25 DIAGNOSIS — C61 Malignant neoplasm of prostate: Secondary | ICD-10-CM | POA: Diagnosis not present

## 2020-09-25 DIAGNOSIS — Z8546 Personal history of malignant neoplasm of prostate: Secondary | ICD-10-CM | POA: Diagnosis not present

## 2020-09-25 DIAGNOSIS — Z9079 Acquired absence of other genital organ(s): Secondary | ICD-10-CM | POA: Diagnosis not present

## 2020-09-25 DIAGNOSIS — R590 Localized enlarged lymph nodes: Secondary | ICD-10-CM | POA: Diagnosis not present

## 2020-09-25 DIAGNOSIS — C772 Secondary and unspecified malignant neoplasm of intra-abdominal lymph nodes: Secondary | ICD-10-CM | POA: Diagnosis not present

## 2020-09-25 DIAGNOSIS — Z79899 Other long term (current) drug therapy: Secondary | ICD-10-CM | POA: Insufficient documentation

## 2020-09-25 DIAGNOSIS — F17210 Nicotine dependence, cigarettes, uncomplicated: Secondary | ICD-10-CM | POA: Insufficient documentation

## 2020-09-25 DIAGNOSIS — R972 Elevated prostate specific antigen [PSA]: Secondary | ICD-10-CM | POA: Diagnosis not present

## 2020-09-25 LAB — CBC
HCT: 32 % — ABNORMAL LOW (ref 39.0–52.0)
Hemoglobin: 10.2 g/dL — ABNORMAL LOW (ref 13.0–17.0)
MCH: 29.9 pg (ref 26.0–34.0)
MCHC: 31.9 g/dL (ref 30.0–36.0)
MCV: 93.8 fL (ref 80.0–100.0)
Platelets: 228 10*3/uL (ref 150–400)
RBC: 3.41 MIL/uL — ABNORMAL LOW (ref 4.22–5.81)
RDW: 14.5 % (ref 11.5–15.5)
WBC: 8.4 10*3/uL (ref 4.0–10.5)
nRBC: 0 % (ref 0.0–0.2)

## 2020-09-25 LAB — PROTIME-INR
INR: 1.1 (ref 0.8–1.2)
Prothrombin Time: 13.9 seconds (ref 11.4–15.2)

## 2020-09-25 MED ORDER — FENTANYL CITRATE (PF) 100 MCG/2ML IJ SOLN
INTRAMUSCULAR | Status: AC | PRN
  Administered 2020-09-25: 50 ug via INTRAVENOUS

## 2020-09-25 MED ORDER — SODIUM CHLORIDE 0.9 % IV SOLN: INTRAVENOUS | Status: DC

## 2020-09-25 MED ORDER — FENTANYL CITRATE (PF) 100 MCG/2ML IJ SOLN
INTRAMUSCULAR | Status: AC
  Filled 2020-09-25: qty 2

## 2020-09-25 MED ORDER — MIDAZOLAM HCL 2 MG/2ML IJ SOLN
INTRAMUSCULAR | Status: AC
  Filled 2020-09-25: qty 2

## 2020-09-25 MED ORDER — MIDAZOLAM HCL 2 MG/2ML IJ SOLN
INTRAMUSCULAR | Status: AC | PRN
Start: 2020-09-25 — End: 2020-09-25
  Administered 2020-09-25: 1 mg via INTRAVENOUS

## 2020-09-25 NOTE — Discharge Instructions (Signed)
Biopsy Discharge Instructions  The procedure you just had is called a biopsy.  You may feel some discomfort after the local anesthetic wears off.  Your discomfort should improve over the next several days.  AFTER YOUR BIOPSY  Rest for the remainder of the day.  Avoid heavy lifting (more than 10 lb/4.5 kg).  If you have been given a general anesthetic or other medications to help you relax, you should not operate machinery, drive or make legal decisions for 24 hours after your procedure.  Additionally, someone must be available to drive you home.  Only take over-the-counter or prescription medicines for pain, discomfort, or fever as directed by your caregiver.  This can make bleeding worse.  You may resume your usual diet after the procedure.  Avoid alcoholic beverages for 24 hours after your procedure.  Keep the skin around your biopsy site clean and dry.  You may shower after 24 hours.  Cleanse and dry the biopsy site completely after you shower.  Avoid baths and swimming for 72 hours.  Complications are very uncommon after this procedure.  Go to the nearest Emergency Department or contact your caregiver if you develop any of the following symptoms:  Worsening pain  Bleeding  Swelling at the biopsy site  Light headedness or dizziness  Shortness of Breath  Fever or chills Redness or increased pain or swelling at the biopsy site   Biopsy Discharge Instructions  The procedure you just had is called a biopsy.  You may feel some discomfort after the local anesthetic wears off.  Your discomfort should improve over the next several days.  AFTER YOUR BIOPSY  Rest for the remainder of the day.  Avoid heavy lifting (more than 10 lb/4.5 kg).  If you have been given a general anesthetic or other medications to help you relax, you should not operate machinery, drive or make legal decisions for 24 hours after your procedure.  Additionally, someone must be available to drive you  home.  Only take over-the-counter or prescription medicines for pain, discomfort, or fever as directed by your caregiver.  This can make bleeding worse.  You may resume your usual diet after the procedure.  Avoid alcoholic beverages for 24 hours after your procedure.  Keep the skin around your biopsy site clean and dry.  You may shower after 24 hours.  Cleanse and dry the biopsy site completely after you shower.  Avoid baths and swimming for 72 hours.  Complications are very uncommon after this procedure.  Go to the nearest Emergency Department or contact your caregiver if you develop any of the following symptoms:  Worsening pain  Bleeding  Swelling at the biopsy site  Light headedness or dizziness  Shortness of Breath  Fever or chills  Redness or increased pain or swelling at the biopsy site

## 2020-09-25 NOTE — Procedures (Signed)
Interventional Radiology Procedure Note  Procedure:  CT guided biopsy RP node on the left.   Complications: None  Recommendations:  - Follow up path - Do not submerge for 7 days - Routine line care  - 1 hr dc home  Signed,  Yvone Neu. Loreta Ave, DO

## 2020-09-25 NOTE — H&P (Signed)
Chief Complaint: Patient was seen in consultation today for image guided retroperitoneal lymph node biopsy.  Referring Physician(s): Benay Pike  Supervising Physician: Corrie Mckusick  Patient Status: [facility] - Out-pt  History of Present Illness: Joseph Rangel is a 75 y.o. male with a past medical history significant for arthritis, HTN, PAD and prostate cancer s/p prostatectomy (2013) who presents today for a retroperitoneal lymph node biopsy. [name] was recently seen by vascular surgery due to PAD and underwent a CTA of the abdominal aorta with ileofemoral runoff on 07/11/20 - on that exam peri aortic, left iliac and perirectal lymphadenopathy was noted incidentally. He was seen by his oncologist and noted to have an elevated PSA. He underwent a PET scan on 09/02/20 which noted low level FDG uptake within these areas of concern. Due to concern for possible metastatic disease given his history of prostate cancer he has been referred to IR for a retroperitoneal lymph node biopsy.    Past Medical History:  Diagnosis Date  . Arthritis   . Cancer [facility])    prostate   . Frequency of urination   . Hypertension   . Seizures ([place])    after brain surg - none in past 20 yrs  . Speech disorder    due to brain surg    Past Surgical History:  Procedure Laterality Date  . BRAIN SURGERY     BLOOD CLOT REMOVED 1980  . MASS EXCISION  02/24/2012   Procedure: MINOR EXCISION OF MASS;  Surgeon: Bernestine Amass, MD;  Location: [facility] ORS;  Service: Urology;;  excision of neck lesion  . ROBOT ASSISTED LAPAROSCOPIC RADICAL PROSTATECTOMY  02/24/2012   Procedure: ROBOTIC ASSISTED LAPAROSCOPIC RADICAL PROSTATECTOMY;  Surgeon: Bernestine Amass, MD;  Location: [facility] ORS;  Service: Urology;  Laterality: N/A;           Allergies: Patient has no known allergies.  Medications: Prior to Admission medications   Medication Sig Start Date End Date Taking? Authorizing Provider  atenolol (TENORMIN) 25 MG tablet Take  25 mg by mouth daily.   Yes [provider]  cilostazol (PLETAL) 50 MG tablet Take 50 mg by mouth daily. 05/25/20  Yes [provider]  fenofibrate (TRICOR) 48 MG tablet Take 48 mg by mouth daily.   Yes [provider]  hydrochlorothiazide (HYDRODIURIL) 25 MG tablet Take 25 mg by mouth daily. 06/24/20  Yes [provider]  HYDROcodone-acetaminophen (NORCO) 10-325 MG tablet Take 1 tablet by mouth every 4 (four) hours as needed for moderate pain.   Yes [provider]  latanoprost (XALATAN) 0.005 % ophthalmic solution Place 1 drop into both eyes every other day. At night 05/25/20  Yes [provider]  meloxicam (MOBIC) 15 MG tablet Take 15 mg by mouth daily.   Yes [provider]  olmesartan (BENICAR) 40 MG tablet Take 40 mg by mouth daily. 06/26/20  Yes [provider]  phenytoin (DILANTIN) 100 MG ER capsule Take 300 mg by mouth daily.   Yes [provider]  verapamil (VERELAN PM) 360 MG 24 hr capsule Take 360 mg by mouth daily. 04/01/20  Yes [provider]     No family history on file.  Social History   Socioeconomic History  . Marital status: Married    Spouse name: Not on file  . Number of children: Not on file  . Years of education: Not on file  . Highest education level: Not on file  Occupational History  . Not on file  Tobacco Use  . Smoking status: Current Every Day Smoker    Types: Cigarettes  . Smokeless tobacco: Never Used  . Tobacco comment: 2 cigarettes per day  Vaping Use  . Vaping Use: Never used  Substance and Sexual Activity  . Alcohol use: No  . Drug use: No  . Sexual activity: Not on file  Other Topics Concern  . Not on file  Social History Narrative  . Not on file   Social Determinants of Health   Financial Resource Strain: Not on file  Food Insecurity: Not on file  Transportation Needs: Not on file  Physical Activity: Not on file  Stress: Not on file  Social  Connections: Not on file     Review of Systems: A 12 point ROS discussed and pertinent positives are indicated in the HPI above.  All other systems are negative.  Review of Systems  Constitutional: Negative for chills and fever.  Respiratory: Negative for cough and shortness of breath.   Cardiovascular: Negative for chest pain.  Gastrointestinal: Negative for abdominal pain, diarrhea, nausea and vomiting.  Musculoskeletal: Negative for back pain.       (+) right foot pain - chronic 2/2 PAD per patient  Skin: Negative for rash and wound.  Neurological: Negative for dizziness and headaches.    Vital Signs: BP (!) 158/88   Pulse 80   Temp 97.8 F (36.6 C) (Oral)   Resp 19   Ht 5\' 9"  (1.753 m)   Wt 175 lb (79.4 kg)   SpO2 99%   BMI 25.84 kg/m   Physical Exam Vitals reviewed.  Constitutional:      General: He is not in acute distress. HENT:     Head: Normocephalic.     Mouth/Throat:     Mouth: Mucous membranes are moist.     Pharynx: Oropharynx is clear. No oropharyngeal exudate or posterior oropharyngeal erythema.  Cardiovascular:     Rate and Rhythm: Normal rate and regular rhythm.  Pulmonary:     Effort: Pulmonary effort is normal.     Breath sounds: Normal breath sounds.  Abdominal:     General: There is no distension.     Palpations: Abdomen is soft.     Tenderness: There is no abdominal tenderness.  Skin:    General: Skin is warm and dry.  Neurological:     Mental Status: He is alert and oriented to person, place, and time.  Psychiatric:        Mood and Affect: Mood normal.        Behavior: Behavior normal.        Thought Content: Thought content normal.        Judgment: Judgment normal.      MD Evaluation Airway: WNL Heart: WNL Abdomen: WNL Chest/ Lungs: WNL ASA  Classification: 2 Mallampati/Airway Score: Two   Imaging: NM PET Image Initial (PI) Skull Base To Thigh  Result Date: 09/03/2020 CLINICAL DATA:  Initial treatment strategy for  adenopathy in 75 year old gentleman with history of prostate cancer discovered on recent imaging, PSA of 207. EXAM: NUCLEAR MEDICINE PET SKULL BASE TO THIGH TECHNIQUE: 8.6 mCi F-18 FDG was injected intravenously. Full-ring PET imaging was performed from the skull base to thigh after the radiotracer. CT data was obtained and used for attenuation correction and anatomic localization. Fasting blood glucose: 124 mg/dl COMPARISON:  01-03-1998 FINDINGS: Mediastinal blood pool activity: SUV max 2.21 Liver activity: SUV max NA NECK: No hypermetabolic lymph nodes in the neck. Incidental CT findings:  Signs of remote RIGHT MCA infarct, also seen on prior PET imaging CHEST: No hypermetabolic mediastinal or hilar nodes. No suspicious pulmonary nodules on the CT scan. Incidental CT findings: Calcified atheromatous plaque in the thoracic aorta. Normal heart size. No pericardial effusion. Normal caliber central pulmonary vasculature. Limited assessment of cardiovascular structures given lack of intravenous contrast. Scattered lymph nodes throughout the chest without pathologic enlargement or hypermetabolic features. Mild basilar atelectasis. Lung parenchyma with limited assessment due to respiratory motion. No consolidation or effusion. ABDOMEN/PELVIS: Bulky adenopathy in the retroperitoneum. Pre aortic lymph node approximately 15 mm within 1 mm of previous size (image 123, series 4) (SUVmax = 2.8) Another preaortic lymph node on image 131 of series 4 measures 13 mm short axis also stable with respect to size with a maximum SUV of approximately 2.4. Additional lymph nodes in retroperitoneum within 1-2 mm of previous measurements. Lymph nodes track into the sigmoid mesentery. Nodal group/soft tissue along the superior rectal vein (image 161, series 4) 1.9 cm stable as measured by this observer on the prior study displaying a maximum SUV of 2.2. Other superior rectal and mesorectal lymph nodes showing no change. No solid organ  uptake. Incidental CT findings: Colonic diverticulosis. No acute intra-abdominal process. Post prostatectomy. Vascular calcifications without aneurysmal dilation. SKELETON: Spinal degenerative changes as before, increased since the prior study IMPRESSION: Adenopathy in the abdomen and pelvis in this patient with elevated PSA showing low level FDG uptake most suggestive of prostate metastases. Other disease processes that could have this distribution rectal cancer and lymphoma. The lack of pronounced FDG uptake in these lymph nodes and the reported PSA level make these less likely. For added specificity PSMA PET could be considered as clinically warranted. Correlate with recent colonoscopy results if available given the pattern of disease in the sigmoid mesentery and in the retroperitoneum as is pattern could be seen in the setting of rectal cancer though again is less likely given the limited FDG uptake and the clinical history provided. Aortic Atherosclerosis (ICD10-I70.0). Electronically Signed   By: Zetta Bills M.D.   On: 09/03/2020 08:19   VAS [place] ABI WITH/WO TBI  Result Date: 09/24/2020 LOWER EXTREMITY DOPPLER STUDY Indications: Peripheral artery disease. High Risk Factors: Hypertension.  Comparison Study: None Performing Technologist: Ivan Croft  Examination Guidelines: A complete evaluation includes at minimum, Doppler waveform signals and systolic blood pressure reading at the level of bilateral brachial, anterior tibial, and posterior tibial arteries, when vessel segments are accessible. Bilateral testing is considered an integral part of a complete examination. Photoelectric Plethysmograph (PPG) waveforms and toe systolic pressure readings are included as required and additional duplex testing as needed. Limited examinations for reoccurring indications may be performed as noted.  ABI Findings: +---------+------------------+-----+--------+--------+ Right    Rt Pressure (mmHg)IndexWaveformComment   +---------+------------------+-----+--------+--------+ Brachial 151                                     +---------+------------------+-----+--------+--------+ PTA      112               0.74 biphasic         +---------+------------------+-----+--------+--------+ DP       107               0.71 biphasic         +---------+------------------+-----+--------+--------+ Great Toe94                0.62                  +---------+------------------+-----+--------+--------+ +---------+------------------+-----+----------+-------+  Left     Lt Pressure (mmHg)IndexWaveform  Comment +---------+------------------+-----+----------+-------+ Brachial 139                                      +---------+------------------+-----+----------+-------+ PTA      74                0.49 monophasic        +---------+------------------+-----+----------+-------+ DP       84                0.56 biphasic          +---------+------------------+-----+----------+-------+ Great Toe90                0.60                   +---------+------------------+-----+----------+-------+ +-------+-----------+-----------+------------+------------+ ABI/TBIToday's ABIToday's TBIPrevious ABIPrevious TBI +-------+-----------+-----------+------------+------------+ Right  0.74       0.62                                +-------+-----------+-----------+------------+------------+ Left   0.56       0.60                                +-------+-----------+-----------+------------+------------+   Summary: Right: Resting right ankle-brachial index indicates moderate right lower extremity arterial disease. The right toe-brachial index is abnormal. Left: Resting left ankle-brachial index indicates moderate left lower extremity arterial disease. The left toe-brachial index is abnormal.  *See table(s) above for measurements and observations.  Electronically signed by Jamelle Haring on 09/24/2020 at 3:26:09 PM.     Final     Labs:  CBC: Recent Labs    08/09/20 1415  WBC 9.0  HGB 10.7*  HCT 32.9*  32.5*  PLT 236    COAGS: No results for input(s): INR, APTT in the last 8760 hours.  BMP: Recent Labs    08/09/20 1415  NA 138  K 3.9  CL 105  CO2 25  GLUCOSE 111*  BUN 21  CALCIUM 9.2  CREATININE 1.44*  GFRNONAA 51*    LIVER FUNCTION TESTS: Recent Labs    08/09/20 1415  BILITOT 0.3  AST 14*  ALT 11  ALKPHOS 138*  PROT 7.9  ALBUMIN 4.2    TUMOR MARKERS: No results for input(s): AFPTM, CEA, CA199, CHROMGRNA in the last 8760 hours.  Assessment and Plan:  75 y/o M with history of prostate cancer s/p prostatectomy (2013) found to have peri aortic, left iliac and perirectal lymphadenopathy on recent CTA for PAD work up who presents today for a retroperitoneal lymph node biopsy to further evaluate for metastatic disease.  Patient has been NPO since 5 pm yesterday, no current anticoagulation/antiplatelet medications. Afebrile, WBC 8.4, hgb 10.2, plt 228, INR 1.1.  Risks and benefits of retroperitoneal lymph node biopsy was discussed with the patient and/or patient's family including, but not limited to bleeding, infection, damage to adjacent structures or low yield requiring additional tests.  All of the questions were answered and there is agreement to proceed.  Consent signed and in chart.  Thank you for this interesting consult.  I greatly enjoyed meeting Kiondre Guzek and look forward to participating in their care.  A copy of this report was sent to the requesting provider on this date.  Electronically  Signed: Joaquim Nam, PA-C 09/25/2020, 9:40 AM   I spent a total of  30 Minutes in face to face in clinical consultation, greater than 50% of which was counseling/coordinating care for retroperitoneal lymph node biopsy.

## 2020-09-27 LAB — SURGICAL PATHOLOGY

## 2020-09-30 DIAGNOSIS — Z719 Counseling, unspecified: Secondary | ICD-10-CM | POA: Diagnosis not present

## 2020-09-30 DIAGNOSIS — C61 Malignant neoplasm of prostate: Secondary | ICD-10-CM | POA: Diagnosis not present

## 2020-09-30 DIAGNOSIS — C772 Secondary and unspecified malignant neoplasm of intra-abdominal lymph nodes: Secondary | ICD-10-CM | POA: Diagnosis not present

## 2020-09-30 DIAGNOSIS — Z720 Tobacco use: Secondary | ICD-10-CM | POA: Diagnosis not present

## 2020-09-30 DIAGNOSIS — I739 Peripheral vascular disease, unspecified: Secondary | ICD-10-CM | POA: Diagnosis not present

## 2020-09-30 DIAGNOSIS — R569 Unspecified convulsions: Secondary | ICD-10-CM | POA: Diagnosis not present

## 2020-10-01 ENCOUNTER — Inpatient Hospital Stay: Payer: Medicare HMO | Attending: Hematology and Oncology | Admitting: Hematology and Oncology

## 2020-10-01 ENCOUNTER — Other Ambulatory Visit: Payer: Self-pay

## 2020-10-01 ENCOUNTER — Telehealth: Payer: Self-pay | Admitting: Hematology and Oncology

## 2020-10-01 DIAGNOSIS — M549 Dorsalgia, unspecified: Secondary | ICD-10-CM | POA: Diagnosis not present

## 2020-10-01 DIAGNOSIS — C779 Secondary and unspecified malignant neoplasm of lymph node, unspecified: Secondary | ICD-10-CM | POA: Diagnosis not present

## 2020-10-01 DIAGNOSIS — M129 Arthropathy, unspecified: Secondary | ICD-10-CM | POA: Diagnosis not present

## 2020-10-01 DIAGNOSIS — Z8669 Personal history of other diseases of the nervous system and sense organs: Secondary | ICD-10-CM | POA: Insufficient documentation

## 2020-10-01 DIAGNOSIS — K5730 Diverticulosis of large intestine without perforation or abscess without bleeding: Secondary | ICD-10-CM | POA: Insufficient documentation

## 2020-10-01 DIAGNOSIS — C772 Secondary and unspecified malignant neoplasm of intra-abdominal lymph nodes: Secondary | ICD-10-CM

## 2020-10-01 DIAGNOSIS — G8929 Other chronic pain: Secondary | ICD-10-CM | POA: Diagnosis not present

## 2020-10-01 DIAGNOSIS — R350 Frequency of micturition: Secondary | ICD-10-CM | POA: Insufficient documentation

## 2020-10-01 DIAGNOSIS — I10 Essential (primary) hypertension: Secondary | ICD-10-CM | POA: Insufficient documentation

## 2020-10-01 DIAGNOSIS — Z79899 Other long term (current) drug therapy: Secondary | ICD-10-CM | POA: Diagnosis not present

## 2020-10-01 DIAGNOSIS — Z79818 Long term (current) use of other agents affecting estrogen receptors and estrogen levels: Secondary | ICD-10-CM | POA: Diagnosis not present

## 2020-10-01 DIAGNOSIS — Z923 Personal history of irradiation: Secondary | ICD-10-CM | POA: Diagnosis not present

## 2020-10-01 DIAGNOSIS — C61 Malignant neoplasm of prostate: Secondary | ICD-10-CM

## 2020-10-01 DIAGNOSIS — F17210 Nicotine dependence, cigarettes, uncomplicated: Secondary | ICD-10-CM | POA: Diagnosis not present

## 2020-10-01 MED ORDER — BICALUTAMIDE 50 MG PO TABS: 50.0000 mg | ORAL_TABLET | Freq: Every day | ORAL | 0 refills | Status: DC

## 2020-10-01 NOTE — Telephone Encounter (Signed)
Scheduled appointments per 1/11 los. Spoke to patient who is aware of appointments dates and times.  

## 2020-10-01 NOTE — Progress Notes (Signed)
[place] CONSULT NOTE  Patient Care Team: Iona Beard, MD as PCP - General (Family Medicine)  CHIEF COMPLAINTS/PURPOSE OF CONSULTATION:  Abnormal imaging.  ASSESSMENT & PLAN:  No problem-specific Assessment & Plan notes found for this encounter.  No orders of the defined types were placed in this encounter.  This is a very pleasant 75 yr old male patient with PMH significant for prostate adenocarcinoma Gleason 3+4, no LN involvement s.p robotic prostatectomy in 2013, who had some imaging done for his CAD referred to hematology for further evaluation of irregular appearing intra pelvic, inguinal and periaortic LN, largest about the left iliac measuring approximately 1.7 cms. He denied any new complaints, has some urinary incontinence since the surgery and this hasn't changed. He has chronic back pain and LE pain, this hasnt changed. No new symptoms overall. Initial PE, chronically ill appearing man, walks slowly with a cane given his PAD and severe LE pain. No palpable LN or hepatosplenomegaly. No BLE edema on exam. I have reviewed his pathology from 2013 and this appears like a early stage prostate cancer. PSA of 207, PET imaging with adenopathy in the abdomen and pelvis in this patient with elevated PSA showing low level FDG uptake most suggestive of prostate metastases. Other disease processes that could have this distribution include rectal cancer and lymphoma. The lack of pronounced FDG uptake in these lymph nodes and the reported PSA level make these less likely. Biopsy from R[facility]N confirmed prostate adenocarcinoma.  Given non regional LN and confirmed histopathologic diagnosis of prostate adeno ca, I discussed this is metastatic adenoca and given low volume recommended ADT with zytiga and prednisone. I discussed adverse effects with treatment including but not limited to hot flashes, weight changes, fluid retention, leg swelling, HTN, electrolyte disturbances, liver  toxicity etc. He is agreeable to proceed, started him on casodex, ordered lupron and zytiga. He will RTC in one month.  Thank you for consulting [place] in the care of this patient.  Please do not hesitate to contact us with any additional questions or concerns.  HISTORY OF PRESENTING ILLNESS:   Joseph Rangel 75 y.o. male is here because of some abnormal lymphadenopathy identified on CT angiogram especially given his history of prostate cancer.  He had robotic-assisted laparoscopic radical retropubic prostatectomy with bilateral pelvic lymph node dissection for a T1c prostate adenocarcinoma. Final pathology from this surgery showed prostatic adenocarcinoma , gleasons score 3+4=7, involving both lobes, no evidence of angiolymphatic invasion, extraprostatic extension or seminal vesicle involvement. All resection margins are clear.  CT showed periaortic, left iliac, peri rectal LAD concerning for metastatic disease, and hence referred to Oncology.  He no showed to his first appointment given lack of transport, and came an hr late for his second appointment. He is here with his wife, he tells me that some LN were enlarged on a CT scan and his doctor wants to make sure he doesn't have cancer again. He denies any changes to his pain, has chronic back pain and pain in his legs for which he takes pain medication and FU with his PCP and pain management. PSA of 207, PET imaging with adenopathy in the abdomen and pelvis in this patient with elevated PSA showing low level FDG uptake most suggestive of prostate metastases. Other disease processes that could have this distribution rectal cancer and lymphoma. The lack of pronounced FDG uptake in these lymph nodes and the reported PSA level make these less likely. Biopsy of RPLN confirmed prostate adenoca.  He  is here for FU.  REVIEW OF SYSTEMS:   Constitutional: Denies fevers, chills or abnormal night sweats Eyes: Denies blurriness of vision, double vision or  watery eyes Ears, nose, mouth, throat, and face: Denies mucositis or sore throat Respiratory: Denies cough, dyspnea or wheezes Cardiovascular: Denies palpitation, chest discomfort or lower extremity swelling Gastrointestinal:  Denies nausea, heartburn or change in bowel habits Skin: Denies abnormal skin rashes Lymphatics: Denies new lymphadenopathy or easy bruising Neurological:Denies numbness, tingling or new weaknesses Behavioral/Psych: Mood is stable, no new changes  All other systems were reviewed with the patient and are negative.  MEDICAL HISTORY:  Past Medical History:  Diagnosis Date  . Arthritis   . Cancer [facility])    prostate   . Frequency of urination   . Hypertension   . Seizures ([place])    after brain surg - none in past 20 yrs  . 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 BRAIN SURGERY     BLOOD CLOT REMOVED 1980  . MASS EXCISION  02/24/2012   Procedure: MINOR EXCISION OF MASS;  Surgeon: Bernestine Amass, MD;  Location: [facility];  Service: Urology;;  excision of neck lesion  . ROBOT ASSISTED LAPAROSCOPIC RADICAL PROSTATECTOMY  02/24/2012   Procedure: ROBOTIC ASSISTED LAPAROSCOPIC RADICAL PROSTATECTOMY;  Surgeon: Bernestine Amass, MD;  Location: [facility];  Service: Urology;  Laterality: N/A;           SOCIAL HISTORY: Social History   Socioeconomic History  . Marital status: Married    Spouse name: Not on file  . Number of children: Not on file  . Years of education: Not on file  . Highest education level: Not on file  Occupational History  . Not on file  Tobacco Use  . Smoking status: Current Every Day Smoker    Types: Cigarettes  . Smokeless tobacco: Never Used  . Tobacco comment: 2 cigarettes per day  Vaping Use  . Vaping Use: Never used  Substance and Sexual Activity  . Alcohol use: No  . Drug use: No  . Sexual activity: Not on file  Other Topics Concern  . Not on file  Social History  Narrative  . Not on file   Social Determinants of Health   Financial Resource Strain: Not on file  Food Insecurity: Not on file  Transportation Needs: Not on file  Physical Activity: Not on file  Stress: Not on file  Social Connections: Not on file  Intimate Partner Violence: Not on file    FAMILY HISTORY: No family history on file.  ALLERGIES:  has No Known Allergies.  MEDICATIONS:  Current Outpatient Medications  Medication Sig Dispense Refill  . atenolol (TENORMIN) 25 MG tablet Take 25 mg by mouth daily.    . cilostazol (PLETAL) 50 MG tablet Take 50 mg by mouth daily.    . fenofibrate (TRICOR) 48 MG tablet Take 48 mg by mouth daily.    . hydrochlorothiazide (HYDRODIURIL) 25 MG tablet Take 25 mg by mouth daily.    Marland Kitchen HYDROcodone-acetaminophen (NORCO) 10-325 MG tablet Take 1 tablet by mouth every 4 (four) hours as needed for moderate pain.    Marland Kitchen latanoprost (XALATAN) 0.005 % ophthalmic solution Place 1 drop into both eyes every other day. At night    . meloxicam (MOBIC) 15 MG tablet Take 15 mg by mouth daily.    Marland Kitchen olmesartan (BENICAR) 40 MG tablet Take 40 mg  by mouth daily.    . phenytoin (DILANTIN) 100 MG ER capsule Take 300 mg by mouth daily.    . verapamil (VERELAN PM) 360 MG 24 hr capsule Take 360 mg by mouth daily.     No current facility-administered medications for this visit.     PHYSICAL EXAMINATION: ECOG PERFORMANCE STATUS: 3 - Symptomatic, >50% confined to bed  There were no vitals filed for this visit. There were no vitals filed for this visit.  GENERAL:alert, no distress, walks with a cane. SKIN: skin color, texture, turgor are normal, no rashes or significant lesions EYES: normal, conjunctiva are pink and non-injected, sclera clear NECK: supple, thyroid normal size, non-tender, without nodularity LYMPH:  no palpable lymphadenopathy in the cervical, axillary or inguinal LUNGS: clear to auscultation and percussion with normal breathing effort HEART: regular  rate & rhythm and no murmurs and no lower extremity edema ABDOMEN:abdomen soft, non-tender and normal bowel sounds Musculoskeletal:clubbing noted. Limited ROM, walks slowly had difficulty getting up on the exam table. PSYCH: alert & oriented x 3 with fluent speech NEURO: no focal motor/sensory deficits  LABORATORY DATA:  I have reviewed the data as listed Lab Results  Component Value Date   WBC 8.4 09/25/2020   HGB 10.2 (L) 09/25/2020   HCT 32.0 (L) 09/25/2020   MCV 93.8 09/25/2020   PLT 228 09/25/2020     Chemistry      Component Value Date/Time   NA 138 08/09/2020 1415   K 3.9 08/09/2020 1415   CL 105 08/09/2020 1415   CO2 25 08/09/2020 1415   BUN 21 08/09/2020 1415   CREATININE 1.44 (H) 08/09/2020 1415      Component Value Date/Time   CALCIUM 9.2 08/09/2020 1415   ALKPHOS 138 (H) 08/09/2020 1415   AST 14 (L) 08/09/2020 1415   ALT 11 08/09/2020 1415   BILITOT 0.3 08/09/2020 1415      RADIOGRAPHIC STUDIES: I have personally reviewed the radiological images as listed and agreed with the findings in the report. NM PET Image Initial (PI) Skull Base To Thigh  Result Date: 09/03/2020 CLINICAL DATA:  Initial treatment strategy for adenopathy in 75 year old gentleman with history of prostate cancer discovered on recent imaging, PSA of 207. EXAM: NUCLEAR MEDICINE PET SKULL BASE TO THIGH TECHNIQUE: 8.6 mCi F-18 FDG was injected intravenously. Full-ring PET imaging was performed from the skull base to thigh after the radiotracer. CT data was obtained and used for attenuation correction and anatomic localization. Fasting blood glucose: 124 mg/dl COMPARISON:  07/11/2020 FINDINGS: Mediastinal blood pool activity: SUV max 2.21 Liver activity: SUV max NA NECK: No hypermetabolic lymph nodes in the neck. Incidental CT findings: Signs of remote RIGHT MCA infarct, also seen on prior PET imaging CHEST: No hypermetabolic mediastinal or hilar nodes. No suspicious pulmonary nodules on the CT scan.  Incidental CT findings: Calcified atheromatous plaque in the thoracic aorta. Normal heart size. No pericardial effusion. Normal caliber central pulmonary vasculature. Limited assessment of cardiovascular structures given lack of intravenous contrast. Scattered lymph nodes throughout the chest without pathologic enlargement or hypermetabolic features. Mild basilar atelectasis. Lung parenchyma with limited assessment due to respiratory motion. No consolidation or effusion. ABDOMEN/PELVIS: Bulky adenopathy in the retroperitoneum. Pre aortic lymph node approximately 15 mm within 1 mm of previous size (image 123, series 4) (SUVmax = 2.8) Another preaortic lymph node on image 131 of series 4 measures 13 mm short axis also stable with respect to size with a maximum SUV of approximately 2.4. Additional lymph nodes  in retroperitoneum within 1-2 mm of previous measurements. Lymph nodes track into the sigmoid mesentery. Nodal group/soft tissue along the superior rectal vein (image 161, series 4) 1.9 cm stable as measured by this observer on the prior study displaying a maximum SUV of 2.2. Other superior rectal and mesorectal lymph nodes showing no change. No solid organ uptake. Incidental CT findings: Colonic diverticulosis. No acute intra-abdominal process. Post prostatectomy. Vascular calcifications without aneurysmal dilation. SKELETON: Spinal degenerative changes as before, increased since the prior study IMPRESSION: Adenopathy in the abdomen and pelvis in this patient with elevated PSA showing low level FDG uptake most suggestive of prostate metastases. Other disease processes that could have this distribution rectal cancer and lymphoma. The lack of pronounced FDG uptake in these lymph nodes and the reported PSA level make these less likely. For added specificity PSMA PET could be considered as clinically warranted. Correlate with recent colonoscopy results if available given the pattern of disease in the sigmoid  mesentery and in the retroperitoneum as is pattern could be seen in the setting of rectal cancer though again is less likely given the limited FDG uptake and the clinical history provided. Aortic Atherosclerosis (ICD10-I70.0). Electronically Signed   By: Zetta Bills M.D.   On: 09/03/2020 08:19   CT Biopsy  Result Date: 09/25/2020 INDICATION: 75 year old male referred for biopsy of retroperitoneal lymph node EXAM: CT BIOPSY MEDICATIONS: None. ANESTHESIA/SEDATION: Moderate (conscious) sedation was employed during this procedure. A total of Versed 1.0 mg and Fentanyl 50 mcg was administered intravenously. Moderate Sedation Time: 19 minutes. The patient's level of consciousness and vital signs were monitored continuously by radiology nursing throughout the procedure under my direct supervision. FLUOROSCOPY TIME:  CT COMPLICATIONS: None PROCEDURE: Informed written consent was obtained from the patient after a thorough discussion of the procedural risks, benefits and alternatives. All questions were addressed. Maximal Sterile Barrier Technique was utilized including caps, mask, sterile gowns, sterile gloves, sterile drape, hand hygiene and skin antiseptic. A timeout was performed prior to the initiation of the procedure. Patient position prone position on the CT gantry table. Scout CT images were acquired. From a left posterior abdominal approach, we targeted a retroperitoneal lymph node with 17 gauge guide needle. Once we confirmed the guide needle in location, multiple 18 gauge core biopsy were performed. Specimen placed into saline. Needle was removed and a final image was stored. Patient tolerated the procedure well and remained hemodynamically stable throughout. No complications were encountered and no significant blood loss. IMPRESSION: Status post CT-guided biopsy retroperitoneal lymph node. Signed, Dulcy Fanny. Dellia Nims, RPVI [facility] Electronically  Signed   By: Corrie Mckusick D.O.   On: 09/25/2020 13:05   VAS [place] ABI WITH/WO TBI  Result Date: 09/24/2020 LOWER EXTREMITY DOPPLER STUDY Indications: Peripheral artery disease. High Risk Factors: Hypertension.  Comparison Study: None Performing Technologist: Ivan Croft  Examination Guidelines: A complete evaluation includes at minimum, Doppler waveform signals and systolic blood pressure reading at the level of bilateral brachial, anterior tibial, and posterior tibial arteries, when vessel segments are accessible. Bilateral testing is considered an integral part of a complete examination. Photoelectric Plethysmograph (PPG) waveforms and toe systolic pressure readings are included as required and additional duplex testing as needed. Limited examinations for reoccurring indications may be performed as noted.  ABI Findings: +---------+------------------+-----+--------+--------+ Right    Rt Pressure (mmHg)IndexWaveformComment  +---------+------------------+-----+--------+--------+ Brachial 151                                     +---------+------------------+-----+--------+--------+  PTA      112               0.74 biphasic         +---------+------------------+-----+--------+--------+ DP       107               0.71 biphasic         +---------+------------------+-----+--------+--------+ Great Toe94                0.62                  +---------+------------------+-----+--------+--------+ +---------+------------------+-----+----------+-------+ Left     Lt Pressure (mmHg)IndexWaveform  Comment +---------+------------------+-----+----------+-------+ Brachial 139                                      +---------+------------------+-----+----------+-------+ PTA      74                0.49 monophasic        +---------+------------------+-----+----------+-------+ DP       84                0.56 biphasic          +---------+------------------+-----+----------+-------+  Great Toe90                0.60                   +---------+------------------+-----+----------+-------+ +-------+-----------+-----------+------------+------------+ ABI/TBIToday's ABIToday's TBIPrevious ABIPrevious TBI +-------+-----------+-----------+------------+------------+ Right  0.74       0.62                                +-------+-----------+-----------+------------+------------+ Left   0.56       0.60                                +-------+-----------+-----------+------------+------------+   Summary: Right: Resting right ankle-brachial index indicates moderate right lower extremity arterial disease. The right toe-brachial index is abnormal. Left: Resting left ankle-brachial index indicates moderate left lower extremity arterial disease. The left toe-brachial index is abnormal.  *See table(s) above for measurements and observations.  Electronically signed by Jamelle Haring on 09/24/2020 at 3:26:09 PM.    Final      0 Result Notes  Component 6 d ago  SURGICAL PATHOLOGY SURGICAL PATHOLOGY  CASE: [id number]  PATIENT: Joseph Rangel  Surgical Pathology Report      Clinical History: possible lymphoma (cm)      FINAL MICROSCOPIC DIAGNOSIS:   A. LYMPH NODE, LEFT RETROPERITONEAL, NEEDLE CORE BIOPSY:  - Metastatic prostate adenocarcinoma.  - See comment.   COMMENT:  Immunohistochemistry is strongly positive with prostein and  prostate-specific antigen.    GROSS DESCRIPTION:  Received fresh are soft white tissue which measure 0.7 and 0.9 cm in  length and each less than 0.1 cm diameter. The specimen is submitted in  toto. (GRP 09/25/2020)        All questions were answered. The patient knows to call the clinic with any problems, questions or concerns. I have spent a total of 30 minutes in the care of this patient including H and P< review of medical records, documentation, counseling and coordination of care.    Benay Pike, MD 10/01/2020 1:53 PM

## 2020-10-02 ENCOUNTER — Telehealth: Payer: Self-pay

## 2020-10-02 ENCOUNTER — Encounter: Payer: Self-pay | Admitting: Hematology and Oncology

## 2020-10-02 ENCOUNTER — Telehealth: Payer: Self-pay | Admitting: Pharmacist

## 2020-10-02 ENCOUNTER — Other Ambulatory Visit: Payer: Self-pay | Admitting: Hematology and Oncology

## 2020-10-02 DIAGNOSIS — C61 Malignant neoplasm of prostate: Secondary | ICD-10-CM | POA: Insufficient documentation

## 2020-10-02 MED ORDER — ABIRATERONE ACETATE 250 MG PO TABS: 1000.0000 mg | ORAL_TABLET | Freq: Every day | ORAL | 0 refills | Status: DC

## 2020-10-02 MED ORDER — PREDNISONE 5 MG PO TABS: 5.0000 mg | ORAL_TABLET | Freq: Every day | ORAL | 3 refills | Status: DC

## 2020-10-02 MED FILL — predniSONE 5 MG TABS: 5 | 30 days supply | Qty: 30 | Fill #0

## 2020-10-02 NOTE — Telephone Encounter (Signed)
Oral Oncology Patient Advocate Encounter  Prior Authorization for Fabio Asa has been approved.    PA# [id number] Effective dates: 10/02/20 through 03/31/21  Patients co-pay is $3.95  Oral Oncology Clinic will continue to follow.   [place] Patient [place] Phone [phone] Fax [phone] 10/02/2020 11:20 AM

## 2020-10-02 NOTE — Telephone Encounter (Signed)
Oral Oncology Pharmacist Encounter  Received new prescription for Zytiga (abiraterone) for the treatment of metastatic prostate cancer in conjunction with prednisone and Lupron, planned duration until disease progression or unacceptable drug toxicity.  Prescription dose and frequency assessed.  CBC from 09/25/20 and CMP from 08/09/20 assessed, noted Scr of 1.44 mg/dL (CrCl ~51 mL/min) - no renal dose adjustments required.  Current medication list in Epic reviewed, DDIs with Zytiga identified:  Category D DDI between Zytiga and Phenytoin - Phenytoin, a strong CYP3A4 inducer can decrease serum concentrations of Zytiga, resulting in decreased efficacy. When coadministered together, it's recommended to increase frequency of Zytiga from once daily to twice daily. Spoke with Dr. Chryl Heck - MD would like to start patient on traditional once daily dosing of Zytiga to assess patient tolerance and monitor PSA closely to assess for need to increase frequency of Zytiga to twice daily.  Evaluated chart and no patient barriers to medication adherence noted.   Prescription has been e-scribed to the [facility] for benefits analysis and approval.  Oral Oncology Clinic will continue to follow for insurance authorization, copayment issues, initial counseling and start date.  Leron Croak, PharmD, BCPS Hematology/Oncology Clinical Pharmacist [facility] [phone] 10/02/2020 9:20 AM

## 2020-10-02 NOTE — Progress Notes (Signed)
START ON PATHWAY REGIMEN - Prostate     A cycle is every 28 days:     Abiraterone acetate (conventional)      Prednisone   **Always confirm dose/schedule in your pharmacy ordering system**  Patient Characteristics: Adenocarcinoma, Recurrent/New Systemic Disease, Non-Castrate, M1, Low Volume Disease Histology: Adenocarcinoma Therapeutic Status: Recurrent/New Systemic Disease Intent of Therapy: Non-Curative / Palliative Intent, Discussed with Patient

## 2020-10-02 NOTE — Telephone Encounter (Signed)
Oral Oncology Patient Advocate Encounter  Received notification from [facility] that prior authorization for Joseph Rangel is required.  PA submitted on CoverMyMeds Key [id number] Status is pending  Oral Oncology Clinic will continue to follow.  [place] Patient [place] Phone [phone] Fax [phone] 10/02/2020 11:16 AM

## 2020-10-03 ENCOUNTER — Telehealth: Payer: Self-pay | Admitting: Hematology and Oncology

## 2020-10-03 NOTE — Telephone Encounter (Signed)
Rescheduled appointments per MD schedule change. Spoke to patient's wife who is aware of updated appointment date and time.

## 2020-10-04 NOTE — Telephone Encounter (Signed)
Oral Chemotherapy Pharmacist Encounter   Attempted to reach patient to provide update and offer for initial counseling on oral medication: Zytiga (abiraterone).   No answer. Left voicemail for patient to call back to discuss details of medication acquisition and initial counseling session.  Leron Croak, PharmD, BCPS Hematology/Oncology Clinical Pharmacist [facility] [phone] 10/04/2020 1:34 PM

## 2020-10-07 MED FILL — ABIRATERONE ACETATE 250 MG: 250 | 30 days supply | Qty: 120 | Fill #0

## 2020-10-07 NOTE — Telephone Encounter (Signed)
Oral Chemotherapy Pharmacist Encounter  I spoke with patient for overview of: Zytiga for the treatment of metastatic prostate cancer in conjunction with prednisone, planned duration until disease progression or unacceptable toxicity.   Counseled patient on administration, dosing, side effects, monitoring, drug-food interactions, safe handling, storage, and disposal.  Patient will take Zytiga 250mg  tablets, 4 tablets (1000mg ) by mouth once daily on an empty stomach, 1 hour before or 2 hours after a meal.  Patient states he will take his Zytiga in the morning and will wait at least 1 hour before eating.  Patient will take prednisone 5mg  tablet, 1 tablet by mouth one daily with breakfast.  Zytiga start date: 10/09/20 - pt knows to start after first injection of Eligard.   Adverse effects include but are not limited to: peripheral edema, GI upset, hypertension, hot flashes, fatigue, and arthralgias.    Prednisone prescription has been sent to [facility] on 10/02/20. Patient will obtain prednisone and knows to start prednisone on the same day as Zytiga start.  Reviewed with patient importance of keeping a medication schedule and plan for any missed doses. No barriers to medication adherence identified.  Medication reconciliation performed and medication/allergy list updated. Discussed drug-drug interaction between Zytiga and Phenytoin with Joseph Rangel. Patient knows that he will have close monitoring of his PSA level to monitor efficacy.   Patient stated he has not been able to start Casodex (bicalutamide) yet due to it being on order at his pharmacy. He stated he was told it should come in early this week. MD notified.   Insurance authorization for Fabio Asa has been obtained. Test claim at the pharmacy revealed copayment $3.95 for 1st fill of Zytiga. This will ship from the [facility] on 10/07/20 to deliver to patient's home on 10/08/20.  Patient informed the pharmacy will  reach out 5-7 days prior to needing next fill of Zytiga to coordinate continued medication acquisition to prevent break in therapy.  All questions answered.  Joseph Rangel voiced understanding and appreciation.   Medication education handout placed in mail for patient. Patient knows to call the office with questions or concerns. Oral Chemotherapy Clinic phone number provided to patient.   Leron Croak, PharmD, BCPS Hematology/Oncology Clinical Pharmacist [facility] [phone] 10/07/2020 12:15 PM

## 2020-10-08 ENCOUNTER — Inpatient Hospital Stay: Payer: Medicare HMO

## 2020-10-08 ENCOUNTER — Telehealth: Payer: Self-pay | Admitting: Hematology and Oncology

## 2020-10-08 NOTE — Telephone Encounter (Signed)
Rescheduled appointment per 1/17 inbasket msg. Spoke to patient's wife who is aware of updated appointment date and time.

## 2020-10-14 DIAGNOSIS — Z79899 Other long term (current) drug therapy: Secondary | ICD-10-CM | POA: Diagnosis not present

## 2020-10-14 DIAGNOSIS — M5136 Other intervertebral disc degeneration, lumbar region: Secondary | ICD-10-CM | POA: Diagnosis not present

## 2020-10-18 ENCOUNTER — Inpatient Hospital Stay: Payer: Medicare HMO

## 2020-10-18 ENCOUNTER — Other Ambulatory Visit: Payer: Self-pay

## 2020-10-18 ENCOUNTER — Other Ambulatory Visit: Payer: Self-pay | Admitting: Hematology and Oncology

## 2020-10-18 VITALS — BP 151/72 | HR 86 | Resp 18

## 2020-10-18 DIAGNOSIS — M549 Dorsalgia, unspecified: Secondary | ICD-10-CM | POA: Diagnosis not present

## 2020-10-18 DIAGNOSIS — G8929 Other chronic pain: Secondary | ICD-10-CM | POA: Diagnosis not present

## 2020-10-18 DIAGNOSIS — C61 Malignant neoplasm of prostate: Secondary | ICD-10-CM | POA: Diagnosis not present

## 2020-10-18 DIAGNOSIS — Z79818 Long term (current) use of other agents affecting estrogen receptors and estrogen levels: Secondary | ICD-10-CM | POA: Diagnosis not present

## 2020-10-18 DIAGNOSIS — M129 Arthropathy, unspecified: Secondary | ICD-10-CM | POA: Diagnosis not present

## 2020-10-18 DIAGNOSIS — R350 Frequency of micturition: Secondary | ICD-10-CM | POA: Diagnosis not present

## 2020-10-18 DIAGNOSIS — C779 Secondary and unspecified malignant neoplasm of lymph node, unspecified: Secondary | ICD-10-CM | POA: Diagnosis not present

## 2020-10-18 DIAGNOSIS — F17210 Nicotine dependence, cigarettes, uncomplicated: Secondary | ICD-10-CM | POA: Diagnosis not present

## 2020-10-18 DIAGNOSIS — I10 Essential (primary) hypertension: Secondary | ICD-10-CM | POA: Diagnosis not present

## 2020-10-18 MED ORDER — LEUPROLIDE ACETATE (3 MONTH) 22.5 MG ~~LOC~~ KIT
PACK | SUBCUTANEOUS | Status: AC
  Filled 2020-10-18: qty 22.5

## 2020-10-18 MED ORDER — LEUPROLIDE ACETATE (3 MONTH) 22.5 MG ~~LOC~~ KIT
22.5000 mg | PACK | Freq: Once | SUBCUTANEOUS | Status: AC
  Administered 2020-10-18: 22.5 mg via SUBCUTANEOUS

## 2020-10-18 NOTE — Patient Instructions (Signed)

## 2020-10-18 NOTE — Progress Notes (Signed)
Called the patient per his request Family member answered Patient is not available to talk at this time. I mentioned that he can call us back if he still needs [place]   Joseph Rangel

## 2020-10-24 DIAGNOSIS — Z1152 Encounter for screening for COVID-19: Secondary | ICD-10-CM | POA: Diagnosis not present

## 2020-10-24 DIAGNOSIS — Z79899 Other long term (current) drug therapy: Secondary | ICD-10-CM | POA: Diagnosis not present

## 2020-10-24 DIAGNOSIS — F17210 Nicotine dependence, cigarettes, uncomplicated: Secondary | ICD-10-CM | POA: Diagnosis not present

## 2020-10-24 DIAGNOSIS — M1712 Unilateral primary osteoarthritis, left knee: Secondary | ICD-10-CM | POA: Diagnosis not present

## 2020-10-24 DIAGNOSIS — M792 Neuralgia and neuritis, unspecified: Secondary | ICD-10-CM | POA: Diagnosis not present

## 2020-10-24 DIAGNOSIS — M5136 Other intervertebral disc degeneration, lumbar region: Secondary | ICD-10-CM | POA: Diagnosis not present

## 2020-10-31 ENCOUNTER — Inpatient Hospital Stay (HOSPITAL_BASED_OUTPATIENT_CLINIC_OR_DEPARTMENT_OTHER): Payer: Medicare HMO | Admitting: Hematology and Oncology

## 2020-10-31 ENCOUNTER — Telehealth: Payer: Self-pay | Admitting: Hematology and Oncology

## 2020-10-31 ENCOUNTER — Inpatient Hospital Stay: Payer: Medicare HMO | Attending: Hematology and Oncology

## 2020-10-31 ENCOUNTER — Other Ambulatory Visit: Payer: Self-pay

## 2020-10-31 VITALS — BP 142/81 | HR 75 | Temp 97.9°F | Resp 18 | Ht 69.0 in | Wt 163.6 lb

## 2020-10-31 DIAGNOSIS — F17210 Nicotine dependence, cigarettes, uncomplicated: Secondary | ICD-10-CM | POA: Insufficient documentation

## 2020-10-31 DIAGNOSIS — Z791 Long term (current) use of non-steroidal anti-inflammatories (NSAID): Secondary | ICD-10-CM | POA: Diagnosis not present

## 2020-10-31 DIAGNOSIS — C772 Secondary and unspecified malignant neoplasm of intra-abdominal lymph nodes: Secondary | ICD-10-CM

## 2020-10-31 DIAGNOSIS — C61 Malignant neoplasm of prostate: Secondary | ICD-10-CM | POA: Insufficient documentation

## 2020-10-31 DIAGNOSIS — I10 Essential (primary) hypertension: Secondary | ICD-10-CM | POA: Insufficient documentation

## 2020-10-31 DIAGNOSIS — R350 Frequency of micturition: Secondary | ICD-10-CM | POA: Insufficient documentation

## 2020-10-31 DIAGNOSIS — R590 Localized enlarged lymph nodes: Secondary | ICD-10-CM

## 2020-10-31 DIAGNOSIS — R569 Unspecified convulsions: Secondary | ICD-10-CM | POA: Diagnosis not present

## 2020-10-31 DIAGNOSIS — Z7952 Long term (current) use of systemic steroids: Secondary | ICD-10-CM | POA: Diagnosis not present

## 2020-10-31 DIAGNOSIS — D649 Anemia, unspecified: Secondary | ICD-10-CM

## 2020-10-31 DIAGNOSIS — Z79899 Other long term (current) drug therapy: Secondary | ICD-10-CM | POA: Insufficient documentation

## 2020-10-31 DIAGNOSIS — Z8546 Personal history of malignant neoplasm of prostate: Secondary | ICD-10-CM

## 2020-10-31 DIAGNOSIS — M1990 Unspecified osteoarthritis, unspecified site: Secondary | ICD-10-CM | POA: Insufficient documentation

## 2020-10-31 DIAGNOSIS — Z923 Personal history of irradiation: Secondary | ICD-10-CM | POA: Insufficient documentation

## 2020-10-31 LAB — CMP (CANCER CENTER ONLY)
ALT: 8 U/L (ref 0–44)
AST: 13 U/L — ABNORMAL LOW (ref 15–41)
Albumin: 4.3 g/dL (ref 3.5–5.0)
Alkaline Phosphatase: 68 U/L (ref 38–126)
Anion gap: 9 (ref 5–15)
BUN: 18 mg/dL (ref 8–23)
CO2: 24 mmol/L (ref 22–32)
Calcium: 8.7 mg/dL — ABNORMAL LOW (ref 8.9–10.3)
Chloride: 107 mmol/L (ref 98–111)
Creatinine: 1.42 mg/dL — ABNORMAL HIGH (ref 0.61–1.24)
GFR, Estimated: 52 mL/min — ABNORMAL LOW (ref >60)
Glucose, Bld: 127 mg/dL — ABNORMAL HIGH (ref 70–99)
Potassium: 3.7 mmol/L (ref 3.5–5.1)
Sodium: 140 mmol/L (ref 135–145)
Total Bilirubin: 0.3 mg/dL (ref 0.3–1.2)
Total Protein: 7.2 g/dL (ref 6.5–8.1)

## 2020-10-31 LAB — CBC WITH DIFFERENTIAL/PLATELET
Abs Immature Granulocytes: 0.05 10*3/uL (ref 0.00–0.07)
Basophils Absolute: 0.1 10*3/uL (ref 0.0–0.1)
Basophils Relative: 1 %
Eosinophils Absolute: 0.2 10*3/uL (ref 0.0–0.5)
Eosinophils Relative: 2 %
HCT: 30.3 % — ABNORMAL LOW (ref 39.0–52.0)
Hemoglobin: 9.8 g/dL — ABNORMAL LOW (ref 13.0–17.0)
Immature Granulocytes: 1 %
Lymphocytes Relative: 17 %
Lymphs Abs: 1.5 10*3/uL (ref 0.7–4.0)
MCH: 29.6 pg (ref 26.0–34.0)
MCHC: 32.3 g/dL (ref 30.0–36.0)
MCV: 91.5 fL (ref 80.0–100.0)
Monocytes Absolute: 0.5 10*3/uL (ref 0.1–1.0)
Monocytes Relative: 6 %
Neutro Abs: 6.7 10*3/uL (ref 1.7–7.7)
Neutrophils Relative %: 73 %
Platelets: 204 10*3/uL (ref 150–400)
RBC: 3.31 MIL/uL — ABNORMAL LOW (ref 4.22–5.81)
RDW: 14.3 % (ref 11.5–15.5)
WBC: 9 10*3/uL (ref 4.0–10.5)
nRBC: 0 % (ref 0.0–0.2)

## 2020-10-31 NOTE — Telephone Encounter (Signed)
Scheduled appointments per 2/10 los. Spoke to patient who is aware of appointments dates and times. Gave patient calendar print out.

## 2020-10-31 NOTE — Progress Notes (Signed)
[facility] CONSULT NOTE  Patient Care Team: Iona Beard, MD as PCP - General (Family Medicine)  CHIEF COMPLAINTS/PURPOSE OF CONSULTATION:  Abnormal imaging.  ASSESSMENT & PLAN:  No problem-specific Assessment & Plan notes found for this encounter.  No orders of the defined types were placed in this encounter.  This is a very pleasant 75 yr old male patient with PMH significant for prostate adenocarcinoma Gleason 3+4, no LN involvement s.p robotic prostatectomy in 2013, who had some imaging done for his CAD referred to hematology for further evaluation of irregular appearing intra pelvic, inguinal and periaortic LN, largest about the left iliac measuring approximately 1.7 cms. He denied any new complaints, has some urinary incontinence since the surgery and this hasn't changed. He has chronic back pain and LE pain, this hasnt changed. No new symptoms overall. Initial PE, chronically ill appearing man, walks slowly with a cane given his PAD and severe LE pain. No palpable LN or hepatosplenomegaly. No BLE edema on exam. I have reviewed his pathology from 2013 and this appears like a early stage prostate cancer. PSA of 207, PET imaging with adenopathy in the abdomen and pelvis in this patient with elevated PSA showing low level FDG uptake most suggestive of prostate metastases. Other disease processes that could have this distribution include rectal cancer and lymphoma. The lack of pronounced FDG uptake in these lymph nodes and the reported PSA level make these less likely. Biopsy from [facility] confirmed prostate adenocarcinoma.  Given non regional LN and confirmed histopathologic diagnosis of prostate adeno ca, I discussed this is metastatic adenoca and given low volume recommended ADT with zytiga and prednisone. I discussed adverse effects with treatment including but not limited to hot flashes, weight changes, fluid retention, leg swelling, HTN, electrolyte disturbances, liver  toxicity etc. He is agreeable to proceed, started him on casodex, ordered lupron and zytiga. He got his first dose of lupron on 10/18/2020, and is now on zytiga ( was only taking one tab despite instructions to take 4 a day) and casodex. In fact he may need more than 4 tabs a day because phenytoin decreases the effective ness of zytiga, but given his age and baseline PS, I think its reasonable to do 4 tabs a day. I have reiterated the need to take medications as prescribed, he will start taking 4 tabs of zytiga a day, stop casodex. Next lupron is in April 2022. Labs from yesterday with significant response, PSA now is 21. Rest of the labs reviewed, no major concerns.  He will RTC in one month.  Thank you for consulting [place] in the care of this patient.  Please do not hesitate to contact us with any additional questions or concerns.  HISTORY OF PRESENTING ILLNESS:   Joseph Rangel 75 y.o. male is here because of some abnormal lymphadenopathy identified on CT angiogram especially given his history of prostate cancer.  He had robotic-assisted laparoscopic radical retropubic prostatectomy with bilateral pelvic lymph node dissection for a T1c prostate adenocarcinoma. Final pathology from this surgery showed prostatic adenocarcinoma , gleasons score 3+4=7, involving both lobes, no evidence of angiolymphatic invasion, extraprostatic extension or seminal vesicle involvement. All resection margins are clear.  CT showed periaortic, left iliac, peri rectal LAD concerning for metastatic disease, and hence referred to Oncology.  He no showed to his first appointment given lack of transport, and came an hr late for his second appointment. He is here with his wife, he tells me that some LN were enlarged on  a CT scan and his doctor wants to make sure he doesn't have cancer again. He denies any changes to his pain, has chronic back pain and pain in his legs for which he takes pain medication and FU with his PCP and pain  management. PSA of 207, PET imaging with adenopathy in the abdomen and pelvis in this patient with elevated PSA showing low level FDG uptake most suggestive of prostate metastases. Other disease processes that could have this distribution rectal cancer and lymphoma. The lack of pronounced FDG uptake in these lymph nodes and the reported PSA level make these less likely. Biopsy of [facility] confirmed prostate adenoca. He started taking casodex and had his first lupron on 10/18/2020 He started taking zytiga but was only taking one tab a day, despite instructions to take 4 a day.   He denies any other complaints. No adverse effects with medications. No new bone pains, fatigue or hot flashes. Eating well. Daughter is with him today  REVIEW OF SYSTEMS:   Constitutional: Denies fevers, chills or abnormal night sweats Eyes: Denies blurriness of vision, double vision or watery eyes Ears, nose, mouth, throat, and face: Denies mucositis or sore throat Respiratory: Denies cough, dyspnea or wheezes Cardiovascular: Denies palpitation, chest discomfort or lower extremity swelling Gastrointestinal:  Denies nausea, heartburn or change in bowel habits Skin: Denies abnormal skin rashes Lymphatics: Denies new lymphadenopathy or easy bruising Neurological:Denies numbness, tingling or new weaknesses Behavioral/Psych: Mood is stable, no new changes  All other systems were reviewed with the patient and are negative.  MEDICAL HISTORY:  Past Medical History:  Diagnosis Date   Arthritis    Cancer [facility])    prostate    Frequency of urination    Hypertension    Seizures ([place])    after brain surg - none in past 20 yrs   Speech disorder    due to brain surg    SURGICAL HISTORY: Past Surgical History:  Procedure Laterality Date   BRAIN SURGERY     BLOOD CLOT REMOVED 1980   MASS EXCISION  02/24/2012   Procedure: MINOR EXCISION OF MASS;  Surgeon: Bernestine Amass, MD;  Location: [facility];  Service:  Urology;;  excision of neck lesion   ROBOT ASSISTED LAPAROSCOPIC RADICAL PROSTATECTOMY  02/24/2012   Procedure: ROBOTIC ASSISTED LAPAROSCOPIC RADICAL PROSTATECTOMY;  Surgeon: Bernestine Amass, MD;  Location: [facility];  Service: Urology;  Laterality: N/A;           SOCIAL HISTORY: Social History   Socioeconomic History   Marital status: Married    Spouse name: Not on file   Number of children: Not on file   Years of education: Not on file   Highest education level: Not on file  Occupational History   Not on file  Tobacco Use   Smoking status: Current Every Day Smoker    Types: Cigarettes   Smokeless tobacco: Never Used   Tobacco comment: 2 cigarettes per day  Vaping Use   Vaping Use: Never used  Substance and Sexual Activity   Alcohol use: No   Drug use: No   Sexual activity: Not on file  Other Topics Concern   Not on file  Social History Narrative   Not on file   Social Determinants of Health   Financial Resource Strain: Not on file  Food Insecurity: Not on file  Transportation Needs: Not on file  Physical Activity: Not on file  Stress: Not on file  Social Connections: Not on file  Intimate Partner Violence: Not on file    FAMILY HISTORY: No family history on file.  ALLERGIES:  has No Known Allergies.  MEDICATIONS:  Current Outpatient Medications  Medication Sig Dispense Refill   abiraterone acetate (ZYTIGA) 250 MG tablet Take 4 tablets (1,000 mg total) by mouth daily. Take on an empty stomach 1 hour before or 2 hours after a meal 120 tablet 0   atenolol (TENORMIN) 25 MG tablet Take 25 mg by mouth daily.     bicalutamide (CASODEX) 50 MG tablet Take 1 tablet (50 mg total) by mouth daily. 60 tablet 0   cilostazol (PLETAL) 50 MG tablet Take 50 mg by mouth daily.     fenofibrate (TRICOR) 48 MG tablet Take 48 mg by mouth daily.     hydrochlorothiazide (HYDRODIURIL) 25 MG tablet Take 25 mg by mouth daily.     HYDROcodone-acetaminophen (NORCO)  10-325 MG tablet Take 1 tablet by mouth every 4 (four) hours as needed for moderate pain.     latanoprost (XALATAN) 0.005 % ophthalmic solution Place 1 drop into both eyes every other day. At night     meloxicam (MOBIC) 15 MG tablet Take 15 mg by mouth daily.     olmesartan (BENICAR) 40 MG tablet Take 40 mg by mouth daily.     phenytoin (DILANTIN) 100 MG ER capsule Take 300 mg by mouth daily.     predniSONE (DELTASONE) 5 MG tablet Take 1 tablet (5 mg total) by mouth daily with breakfast. 30 tablet 3   verapamil (VERELAN PM) 360 MG 24 hr capsule Take 360 mg by mouth daily.     No current facility-administered medications for this visit.     PHYSICAL EXAMINATION: ECOG PERFORMANCE STATUS: 3 - Symptomatic, >50% confined to bed  Vitals:   10/31/20 1457  BP: (!) 142/81  Pulse: 75  Resp: 18  Temp: 97.9 F (36.6 C)  SpO2: 99%   Filed Weights   10/31/20 1457  Weight: 163 lb 9.6 oz (74.2 kg)    GENERAL:alert, no distress, walks with a cane. SKIN: skin color, texture, turgor are normal, no rashes or significant lesions EYES: normal, conjunctiva are pink and non-injected, sclera clear NECK: supple, thyroid normal size, non-tender, without nodularity LYMPH:  no palpable lymphadenopathy in the cervical, axillary or inguinal LUNGS: clear to auscultation and percussion with normal breathing effort HEART: regular rate & rhythm and no murmurs and no lower extremity edema ABDOMEN:abdomen soft, non-tender and normal bowel sounds Musculoskeletal:clubbing noted. Limited ROM, walks slowly had difficulty getting up on the exam table. PSYCH: alert & oriented x 3 with fluent speech NEURO: no focal motor/sensory deficits  LABORATORY DATA:  I have reviewed the data as listed Lab Results  Component Value Date   WBC 9.0 10/31/2020   HGB 9.8 (L) 10/31/2020   HCT 30.3 (L) 10/31/2020   MCV 91.5 10/31/2020   PLT 204 10/31/2020     Chemistry      Component Value Date/Time   NA 138 08/09/2020  1415   K 3.9 08/09/2020 1415   CL 105 08/09/2020 1415   CO2 25 08/09/2020 1415   BUN 21 08/09/2020 1415   CREATININE 1.44 (H) 08/09/2020 1415      Component Value Date/Time   CALCIUM 9.2 08/09/2020 1415   ALKPHOS 138 (H) 08/09/2020 1415   AST 14 (L) 08/09/2020 1415   ALT 11 08/09/2020 1415   BILITOT 0.3 08/09/2020 1415      RADIOGRAPHIC STUDIES: I have personally reviewed the radiological images  as listed and agreed with the findings in the report. No results found.   0 Result Notes  Component 6 d ago  SURGICAL PATHOLOGY SURGICAL PATHOLOGY  CASE: [id number]  PATIENT: Llewelyn Mangas  Surgical Pathology Report      Clinical History: possible lymphoma (cm)      FINAL MICROSCOPIC DIAGNOSIS:   A. LYMPH NODE, LEFT RETROPERITONEAL, NEEDLE CORE BIOPSY:  - Metastatic prostate adenocarcinoma.  - See comment.   COMMENT:  Immunohistochemistry is strongly positive with prostein and  prostate-specific antigen.    GROSS DESCRIPTION:  Received fresh are soft white tissue which measure 0.7 and 0.9 cm in  length and each less than 0.1 cm diameter. The specimen is submitted in  toto. (GRP 09/25/2020)        All questions were answered. The patient knows to call the clinic with any problems, questions or concerns. I have spent a total of 30 minutes in the care of this patient including H and P< review of medical records, documentation, counseling and coordination of care.    Benay Pike, MD 10/31/2020 3:13 PM

## 2020-11-01 ENCOUNTER — Encounter: Payer: Self-pay | Admitting: Hematology and Oncology

## 2020-11-01 ENCOUNTER — Other Ambulatory Visit: Payer: Self-pay | Admitting: Hematology and Oncology

## 2020-11-01 ENCOUNTER — Ambulatory Visit: Payer: Medicare HMO | Admitting: Hematology and Oncology

## 2020-11-01 ENCOUNTER — Other Ambulatory Visit: Payer: Medicare HMO

## 2020-11-01 LAB — PSA, TOTAL AND FREE
PSA, Free Pct: 7.3 %
PSA, Free: 1.55 ng/mL
Prostate Specific Ag, Serum: 21.1 ng/mL — ABNORMAL HIGH (ref 0.0–4.0)

## 2020-11-01 NOTE — Telephone Encounter (Signed)
Please see Rx refill 

## 2020-11-04 DIAGNOSIS — R569 Unspecified convulsions: Secondary | ICD-10-CM | POA: Diagnosis not present

## 2020-11-04 DIAGNOSIS — I739 Peripheral vascular disease, unspecified: Secondary | ICD-10-CM | POA: Diagnosis not present

## 2020-11-04 DIAGNOSIS — Z720 Tobacco use: Secondary | ICD-10-CM | POA: Diagnosis not present

## 2020-11-04 DIAGNOSIS — C61 Malignant neoplasm of prostate: Secondary | ICD-10-CM | POA: Diagnosis not present

## 2020-11-04 DIAGNOSIS — I10 Essential (primary) hypertension: Secondary | ICD-10-CM | POA: Diagnosis not present

## 2020-11-04 DIAGNOSIS — Z7189 Other specified counseling: Secondary | ICD-10-CM | POA: Diagnosis not present

## 2020-11-04 MED FILL — predniSONE 5 MG TABS: 5 | 30 days supply | Qty: 30 | Fill #1

## 2020-11-04 MED FILL — ABIRATERONE ACETATE 250 MG: 250 | 30 days supply | Qty: 120 | Fill #0

## 2020-11-21 DIAGNOSIS — M5136 Other intervertebral disc degeneration, lumbar region: Secondary | ICD-10-CM | POA: Diagnosis not present

## 2020-11-21 DIAGNOSIS — F17210 Nicotine dependence, cigarettes, uncomplicated: Secondary | ICD-10-CM | POA: Diagnosis not present

## 2020-11-21 DIAGNOSIS — Z79899 Other long term (current) drug therapy: Secondary | ICD-10-CM | POA: Diagnosis not present

## 2020-11-21 DIAGNOSIS — M792 Neuralgia and neuritis, unspecified: Secondary | ICD-10-CM | POA: Diagnosis not present

## 2020-11-21 DIAGNOSIS — M1712 Unilateral primary osteoarthritis, left knee: Secondary | ICD-10-CM | POA: Diagnosis not present

## 2020-11-25 DIAGNOSIS — M1712 Unilateral primary osteoarthritis, left knee: Secondary | ICD-10-CM | POA: Diagnosis not present

## 2020-11-25 DIAGNOSIS — Z9181 History of falling: Secondary | ICD-10-CM | POA: Diagnosis not present

## 2020-11-25 DIAGNOSIS — M792 Neuralgia and neuritis, unspecified: Secondary | ICD-10-CM | POA: Diagnosis not present

## 2020-11-25 DIAGNOSIS — E663 Overweight: Secondary | ICD-10-CM | POA: Diagnosis not present

## 2020-11-25 DIAGNOSIS — Z79899 Other long term (current) drug therapy: Secondary | ICD-10-CM | POA: Diagnosis not present

## 2020-11-25 DIAGNOSIS — F17210 Nicotine dependence, cigarettes, uncomplicated: Secondary | ICD-10-CM | POA: Diagnosis not present

## 2020-11-25 DIAGNOSIS — M5136 Other intervertebral disc degeneration, lumbar region: Secondary | ICD-10-CM | POA: Diagnosis not present

## 2020-11-28 ENCOUNTER — Inpatient Hospital Stay: Payer: Medicare HMO

## 2020-11-28 ENCOUNTER — Telehealth: Payer: Self-pay | Admitting: Hematology and Oncology

## 2020-11-28 ENCOUNTER — Inpatient Hospital Stay: Payer: Medicare HMO | Admitting: Hematology and Oncology

## 2020-11-28 MED FILL — predniSONE 5 MG TABS: 5 | 30 days supply | Qty: 30 | Fill #2

## 2020-11-28 NOTE — Telephone Encounter (Signed)
Scheduled appts per 3/10 sch msg. Pt aware.

## 2020-11-29 ENCOUNTER — Ambulatory Visit (INDEPENDENT_AMBULATORY_CARE_PROVIDER_SITE_OTHER): Payer: Medicare HMO

## 2020-11-29 ENCOUNTER — Ambulatory Visit (HOSPITAL_COMMUNITY)
Admission: EM | Admit: 2020-11-29 | Discharge: 2020-11-29 | Disposition: A | Discharge status: Home / Self Care | Payer: Medicare HMO | Attending: Emergency Medicine | Admitting: Emergency Medicine

## 2020-11-29 ENCOUNTER — Other Ambulatory Visit: Payer: Self-pay

## 2020-11-29 ENCOUNTER — Encounter (HOSPITAL_COMMUNITY): Payer: Self-pay

## 2020-11-29 DIAGNOSIS — M25561 Pain in right knee: Secondary | ICD-10-CM | POA: Diagnosis not present

## 2020-11-29 NOTE — Discharge Instructions (Addendum)
Continue your current pain medication.  Rest and elevate your knee.  Apply ice packs 2-3 times a day for 15 to 20 minutes.  Wear the knee sleeve as needed for comfort.  Follow-up with your primary care provider or an orthopedist if your symptoms persist.

## 2020-11-29 NOTE — ED Triage Notes (Addendum)
Pt in with c/o right knee and leg pain that started on Monday  Pt has been taking medication at the pain clinic  Denies any recent injury

## 2020-11-29 NOTE — ED Provider Notes (Signed)
[place]    CSN: [id number] Arrival date & time: 11/29/20  1323      History   Chief Complaint Chief Complaint  Patient presents with  . Knee Pain  . Leg Pain    HPI Joseph Rangel is a 75 y.o. male.   Patient presents with 5-day history of right knee pain.  No falls or injury.  He states the pain is worse with ambulation and weightbearing.  He denies swelling, numbness, unusual weakness, paresthesias, wounds, redness, bruising, or other symptoms.  Treatment attempted at home with pain medication.  His medical history includes prostate cancer metastatic to intra-abdominal lymph node, hypertension, seizures, arthritis.  The history is provided by the patient and medical records.    Past Medical History:  Diagnosis Date  . Arthritis   . Cancer [facility])    prostate   . Frequency of urination   . Hypertension   . Seizures ([place])    after brain surg - none in past 20 yrs  . Speech disorder    due to brain surg    Patient Active Problem List   Diagnosis Date Noted  . Prostate cancer metastatic to intraabdominal lymph node (Joseph Rangel) 10/02/2020    Past Surgical History:  Procedure Laterality Date  . BRAIN SURGERY     BLOOD CLOT REMOVED 1980  . MASS EXCISION  02/24/2012   Procedure: MINOR EXCISION OF MASS;  Surgeon: Bernestine Amass, MD;  Location: [facility];  Service: Urology;;  excision of neck lesion  . ROBOT ASSISTED LAPAROSCOPIC RADICAL PROSTATECTOMY  02/24/2012   Procedure: ROBOTIC ASSISTED LAPAROSCOPIC RADICAL PROSTATECTOMY;  Surgeon: Bernestine Amass, MD;  Location: [facility];  Service: Urology;  Laterality: N/A;              Home Medications    Prior to Admission medications   Medication Sig Start Date End Date Taking? Authorizing Provider  abiraterone acetate (ZYTIGA) 250 MG tablet TAKE 4 TABLETS (1,000 MG TOTAL) BY MOUTH DAILY. TAKE ON AN EMPTY STOMACH 1 HOUR BEFORE OR 2 HOURS AFTER A MEAL 11/01/20   Benay Pike, MD  atenolol (TENORMIN) 25 MG tablet Take 25  mg by mouth daily.    [provider]  cilostazol (PLETAL) 50 MG tablet Take 50 mg by mouth daily. 05/25/20   [provider]  fenofibrate (TRICOR) 48 MG tablet Take 48 mg by mouth daily.    [provider]  hydrochlorothiazide (HYDRODIURIL) 25 MG tablet Take 25 mg by mouth daily. 06/24/20   [provider]  HYDROcodone-acetaminophen (NORCO) 10-325 MG tablet Take 1 tablet by mouth every 4 (four) hours as needed for moderate pain.    [provider]  latanoprost (XALATAN) 0.005 % ophthalmic solution Place 1 drop into both eyes every other day. At night 05/25/20   [provider]  meloxicam (MOBIC) 15 MG tablet Take 15 mg by mouth daily.    [provider]  olmesartan (BENICAR) 40 MG tablet Take 40 mg by mouth daily. 06/26/20   [provider]  phenytoin (DILANTIN) 100 MG ER capsule Take 300 mg by mouth daily.    [provider]  predniSONE (DELTASONE) 5 MG tablet Take 1 tablet (5 mg total) by mouth daily with breakfast. 10/02/20   Benay Pike, MD  verapamil (VERELAN PM) 360 MG 24 hr capsule Take 360 mg by mouth daily. 04/01/20   [provider]    Family History History reviewed. No pertinent family history.  Social History Social History  Tobacco Use  . Smoking status: Current Every Day Smoker    Types: Cigarettes  . Smokeless tobacco: Never Used  . Tobacco comment: 2 cigarettes per day  Vaping Use  . Vaping Use: Never used  Substance Use Topics  . Alcohol use: No  . Drug use: No     Allergies   Patient has no known allergies.   Review of Systems Review of Systems  Constitutional: Negative for chills and fever.  HENT: Negative for ear pain and sore throat.   Eyes: Negative for pain and visual disturbance.  Respiratory: Negative for cough and shortness of breath.   Cardiovascular: Negative for chest pain and palpitations.  Gastrointestinal: Negative for abdominal pain and vomiting.   Genitourinary: Negative for dysuria and hematuria.  Musculoskeletal: Positive for arthralgias and gait problem. Negative for joint swelling.  Skin: Negative for color change and rash.  Neurological: Negative for syncope, weakness and numbness.  All other systems reviewed and are negative.    Physical Exam Triage Vital Signs ED Triage Vitals  Enc Vitals Group     BP      Pulse      Resp      Temp      Temp src      SpO2      Weight      Height      Head Circumference      Peak Flow      Pain Score      Pain Loc      Pain Edu?      Excl. in [place]?    No data found.  Updated Vital Signs BP 135/80   Pulse 78   Temp 98.5 F (36.9 C)   Resp 17   SpO2 99%   Visual Acuity Right Eye Distance:   Left Eye Distance:   Bilateral Distance:    Right Eye Near:   Left Eye Near:    Bilateral Near:     Physical Exam Vitals and nursing note reviewed.  Constitutional:      General: He is not in acute distress.    Appearance: He is well-developed.  HENT:     Head: Normocephalic and atraumatic.     Mouth/Throat:     Mouth: Mucous membranes are moist.  Eyes:     Conjunctiva/sclera: Conjunctivae normal.  Cardiovascular:     Rate and Rhythm: Normal rate and regular rhythm.     Heart sounds: Normal heart sounds.  Pulmonary:     Effort: Pulmonary effort is normal. No respiratory distress.     Breath sounds: Normal breath sounds.  Abdominal:     Palpations: Abdomen is soft.     Tenderness: There is no abdominal tenderness.  Musculoskeletal:        General: No swelling, tenderness or deformity. Normal range of motion.     Cervical back: Neck supple.     Comments: Right knee has normal ROM, sensation intact, strength 5/5.  No erythema, edema, ecchymosis, wounds.  Skin:    General: Skin is warm and dry.     Capillary Refill: Capillary refill takes less than 2 seconds.     Findings: No bruising, erythema, lesion or rash.  Neurological:     General: No focal deficit present.      Mental Status: He is alert.     Sensory: No sensory deficit.     Motor: No weakness.     Gait: Gait abnormal.  Psychiatric:  Mood and Affect: Mood normal.        Behavior: Behavior normal.      UC Treatments / Results  Labs (all labs ordered are listed, but only abnormal results are displayed) Labs Reviewed - No data to display  EKG   Radiology DG Knee Complete 4 Views Right  Result Date: 11/29/2020 CLINICAL DATA:  Right knee pain for 5 days. EXAM: RIGHT KNEE - COMPLETE 4+ VIEW COMPARISON:  None. FINDINGS: Moderate tricompartmental degenerative changes but no acute fracture or osteochondral lesion. No chondrocalcinosis. No joint effusion. IMPRESSION: Tricompartmental degenerative changes but no acute bony findings or joint effusion. Electronically Signed   By: Marijo Sanes M.D.   On: 11/29/2020 15:04    Procedures Procedures (including critical care time)  Medications Ordered in UC Medications - No data to display  Initial Impression / Assessment and Plan / UC Course  I have reviewed the triage vital signs and the nursing notes.  Pertinent labs & imaging results that were available during my care of the patient were reviewed by me and considered in my medical decision making (see chart for details).   Right knee pain.  X-ray shows no acute bony abnormality.  Treating with knee sleeve, rest, elevation, ice packs.  Instructed patient to follow-up with his PCP or an orthopedist if his symptoms persist.  He agrees to plan of care.   Final Clinical Impressions(s) / UC Diagnoses   Final diagnoses:  Acute pain of right knee     Discharge Instructions     Continue your current pain medication.  Rest and elevate your knee.  Apply ice packs 2-3 times a day for 15 to 20 minutes.  Wear the knee sleeve as needed for comfort.  Follow-up with your primary care provider or an orthopedist if your symptoms persist.    ED Prescriptions    None     I have reviewed the  PDMP during this encounter.   Sharion Balloon, NP 11/29/20 1510

## 2020-12-04 ENCOUNTER — Encounter: Payer: Self-pay | Admitting: Hematology and Oncology

## 2020-12-04 ENCOUNTER — Other Ambulatory Visit: Payer: Self-pay

## 2020-12-04 ENCOUNTER — Inpatient Hospital Stay: Payer: Medicare HMO | Attending: Hematology and Oncology

## 2020-12-04 ENCOUNTER — Inpatient Hospital Stay: Payer: Medicare HMO | Admitting: Hematology and Oncology

## 2020-12-04 VITALS — BP 150/56 | HR 68 | Temp 97.5°F | Resp 18 | Ht 69.0 in | Wt 163.8 lb

## 2020-12-04 DIAGNOSIS — M129 Arthropathy, unspecified: Secondary | ICD-10-CM | POA: Diagnosis not present

## 2020-12-04 DIAGNOSIS — M25559 Pain in unspecified hip: Secondary | ICD-10-CM | POA: Diagnosis not present

## 2020-12-04 DIAGNOSIS — M25561 Pain in right knee: Secondary | ICD-10-CM | POA: Diagnosis not present

## 2020-12-04 DIAGNOSIS — C772 Secondary and unspecified malignant neoplasm of intra-abdominal lymph nodes: Secondary | ICD-10-CM | POA: Diagnosis not present

## 2020-12-04 DIAGNOSIS — R590 Localized enlarged lymph nodes: Secondary | ICD-10-CM

## 2020-12-04 DIAGNOSIS — I10 Essential (primary) hypertension: Secondary | ICD-10-CM | POA: Insufficient documentation

## 2020-12-04 DIAGNOSIS — F17210 Nicotine dependence, cigarettes, uncomplicated: Secondary | ICD-10-CM | POA: Insufficient documentation

## 2020-12-04 DIAGNOSIS — Z79899 Other long term (current) drug therapy: Secondary | ICD-10-CM | POA: Diagnosis not present

## 2020-12-04 DIAGNOSIS — C61 Malignant neoplasm of prostate: Secondary | ICD-10-CM | POA: Insufficient documentation

## 2020-12-04 DIAGNOSIS — Z8546 Personal history of malignant neoplasm of prostate: Secondary | ICD-10-CM

## 2020-12-04 LAB — CMP (CANCER CENTER ONLY)
ALT: 10 U/L (ref 0–44)
AST: 13 U/L — ABNORMAL LOW (ref 15–41)
Albumin: 4.4 g/dL (ref 3.5–5.0)
Alkaline Phosphatase: 76 U/L (ref 38–126)
Anion gap: 9 (ref 5–15)
BUN: 21 mg/dL (ref 8–23)
CO2: 27 mmol/L (ref 22–32)
Calcium: 9.1 mg/dL (ref 8.9–10.3)
Chloride: 105 mmol/L (ref 98–111)
Creatinine: 1.41 mg/dL — ABNORMAL HIGH (ref 0.61–1.24)
GFR, Estimated: 52 mL/min — ABNORMAL LOW (ref >60)
Glucose, Bld: 147 mg/dL — ABNORMAL HIGH (ref 70–99)
Potassium: 3.3 mmol/L — ABNORMAL LOW (ref 3.5–5.1)
Sodium: 141 mmol/L (ref 135–145)
Total Bilirubin: 0.4 mg/dL (ref 0.3–1.2)
Total Protein: 7.5 g/dL (ref 6.5–8.1)

## 2020-12-04 LAB — CBC WITH DIFFERENTIAL/PLATELET
Abs Immature Granulocytes: 0.06 10*3/uL (ref 0.00–0.07)
Basophils Absolute: 0.1 10*3/uL (ref 0.0–0.1)
Basophils Relative: 1 %
Eosinophils Absolute: 0.1 10*3/uL (ref 0.0–0.5)
Eosinophils Relative: 1 %
HCT: 31.1 % — ABNORMAL LOW (ref 39.0–52.0)
Hemoglobin: 10.5 g/dL — ABNORMAL LOW (ref 13.0–17.0)
Immature Granulocytes: 1 %
Lymphocytes Relative: 18 %
Lymphs Abs: 1.7 10*3/uL (ref 0.7–4.0)
MCH: 30.2 pg (ref 26.0–34.0)
MCHC: 33.8 g/dL (ref 30.0–36.0)
MCV: 89.4 fL (ref 80.0–100.0)
Monocytes Absolute: 0.5 10*3/uL (ref 0.1–1.0)
Monocytes Relative: 6 %
Neutro Abs: 7.3 10*3/uL (ref 1.7–7.7)
Neutrophils Relative %: 73 %
Platelets: 248 10*3/uL (ref 150–400)
RBC: 3.48 MIL/uL — ABNORMAL LOW (ref 4.22–5.81)
RDW: 13.5 % (ref 11.5–15.5)
WBC: 9.8 10*3/uL (ref 4.0–10.5)
nRBC: 0 % (ref 0.0–0.2)

## 2020-12-04 NOTE — Progress Notes (Signed)
[place] CONSULT NOTE  Patient Care Team: Iona Beard, MD as PCP - General (Family Medicine)  CHIEF COMPLAINTS/PURPOSE OF CONSULTATION:  Abnormal imaging.  ASSESSMENT & PLAN:   No problem-specific Assessment & Plan notes found for this encounter.  Orders Placed This Encounter  Procedures  . CMP ([facility] only)    Standing Status:   Future    Standing Expiration Date:   12/04/2021  . PSA, total and free    Standing Status:   Future    Standing Expiration Date:   12/04/2021  . CBC with Differential/Platelet    Standing Status:   Standing    Number of Occurrences:   22    Standing Expiration Date:   12/04/2021  . CMP ([facility] only)    Standing Status:   Standing    Number of Occurrences:   20    Standing Expiration Date:   12/04/2021  . PSA, total and free    Standing Status:   Standing    Number of Occurrences:   20    Standing Expiration Date:   12/04/2021   This is a very pleasant 75 yr old male patient with PMH significant for prostate adenocarcinoma Gleason 3+4, no LN involvement s.p robotic prostatectomy in 2013, presented with peri aortic left iliac and peri rectal LN, with biopsy confirming metastatic prostate adenoca now on zytiga and lupron presents here for FU on his metastatic prostate cancer. Baseline PSA of 207 He is doing well except for right knee pain after a fall. He went to the ED, had some X rays no fractures. He has been taking some pain medication which he uses for his back pain, his PCP is also going to refer him to orthopaedics to see if they can be of more help He had no bone mets from prostate cancer on prior imaging, but most of our scans do not extend below mid thigh. He was told to contact me if his pain linger for another 2 weeks and if he cant get to an orthopaedics doctor. He expressed understanding  He otherwise is tolerating zytiga very well, NO complaints. Labs from today reviewed, no transaminitis. PSA pending CBC  reviewed.  At this time plan is to continue zytiga, lupron and prednisone,  RTC in 4 weeks Standing orders in place for CBC, CMP and PSA.  He will RTC in one month.  Thank you for consulting [place] in the care of this patient.  Please do not hesitate to contact us with any additional questions or concerns.  HISTORY OF PRESENTING ILLNESS:   Lyndon Chapel 75 y.o. male is here because of some abnormal lymphadenopathy identified on CT angiogram especially given his history of prostate cancer.  He had robotic-assisted laparoscopic radical retropubic prostatectomy with bilateral pelvic lymph node dissection for a T1c prostate adenocarcinoma. Final pathology from this surgery showed prostatic adenocarcinoma , gleasons score 3+4=7, involving both lobes, no evidence of angiolymphatic invasion, extraprostatic extension or seminal vesicle involvement. All resection margins are clear.  CT showed periaortic, left iliac, peri rectal LAD concerning for metastatic disease, and hence referred to Oncology.  He no showed to his first appointment given lack of transport, and came an hr late for his second appointment. He is here with his wife, he tells me that some LN were enlarged on a CT scan and his doctor wants to make sure he doesn't have cancer again. He denies any changes to his pain, has chronic back pain and pain in  his legs for which he takes pain medication and FU with his PCP and pain management. PSA of 207, PET imaging with adenopathy in the abdomen and pelvis in this patient with elevated PSA showing low level FDG uptake most suggestive of prostate metastases. Other disease processes that could have this distribution rectal cancer and lymphoma. The lack of pronounced FDG uptake in these lymph nodes and the reported PSA level make these less likely. Biopsy of RPLN confirmed prostate adenoca. He started taking casodex and had his first lupron on 10/18/2020 He started taking zytiga but was only taking one  tab a day, despite instructions to take 4 a day. Although there is some new literature suggesting to take one tab if you eat a fatty meal, it is not reliable if he would end up eating a fatty meal every day, and also give concomitant use of phenytoin which is an inducer, in theory he may need more than 4 tabs a day. But given his age and PS, we recommended continuing 1000 mg of zytiga daily  Interim History  Patient is here for follow-up with his wife.  He is taking Zytiga, prednisone as recommended.  He recently fell on his bottom and since then has had some pain in his hip and right knee.  He went to the emergency room, had some imaging which did not show any evidence of fracture.  He is taking the hydrocodone that he usually uses for his back pain.  There is no overt swelling of the leg except for mild swelling around the right knee.  He is able to bear weight on it but it hurts to bear weight.  He saw his primary care physician who suggested referral to orthopedics if his pain continues.  He otherwise again has no nausea, vomiting, diarrhea from Zytiga.  He has good appetite he is doing really well overall.  Rest of the pertinent 10 point ROS reviewed and negative.  MEDICAL HISTORY:  Past Medical History:  Diagnosis Date  . Arthritis   . Cancer [facility])    prostate   . Frequency of urination   . Hypertension   . Seizures ([place])    after brain surg - none in past 20 yrs  . 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 BRAIN SURGERY     BLOOD CLOT REMOVED 1980  . MASS EXCISION  02/24/2012   Procedure: MINOR EXCISION OF MASS;  Surgeon: Bernestine Amass, MD;  Location: [facility];  Service: Urology;;  excision of neck lesion  . ROBOT ASSISTED LAPAROSCOPIC RADICAL PROSTATECTOMY  02/24/2012   Procedure: ROBOTIC ASSISTED LAPAROSCOPIC RADICAL PROSTATECTOMY;  Surgeon: Bernestine Amass, MD;  Location: [facility];  Service: Urology;  Laterality:  N/A;           SOCIAL HISTORY: Social History   Socioeconomic History  . Marital status: Married    Spouse name: Not on file  . Number of children: Not on file  . Years of education: Not on file  . Highest education level: Not on file  Occupational History  . Not on file  Tobacco Use  . Smoking status: Current Every Day Smoker    Types: Cigarettes  . Smokeless tobacco: Never Used  . Tobacco comment: 2 cigarettes per day  Vaping Use  . Vaping Use: Never used  Substance and Sexual Activity  . Alcohol use: No  . Drug use: No  .  Sexual activity: Not on file  Other Topics Concern  . Not on file  Social History Narrative  . Not on file   Social Determinants of Health   Financial Resource Strain: Not on file  Food Insecurity: Not on file  Transportation Needs: Not on file  Physical Activity: Not on file  Stress: Not on file  Social Connections: Not on file  Intimate Partner Violence: Not on file    FAMILY HISTORY: No family history on file.  ALLERGIES:  has No Known Allergies.  MEDICATIONS:  Current Outpatient Medications  Medication Sig Dispense Refill  . abiraterone acetate (ZYTIGA) 250 MG tablet TAKE 4 TABLETS (1,000 MG TOTAL) BY MOUTH DAILY. TAKE ON AN EMPTY STOMACH 1 HOUR BEFORE OR 2 HOURS AFTER A MEAL 120 tablet 0  . atenolol (TENORMIN) 25 MG tablet Take 25 mg by mouth daily.    . cilostazol (PLETAL) 50 MG tablet Take 50 mg by mouth daily.    . fenofibrate (TRICOR) 48 MG tablet Take 48 mg by mouth daily.    . hydrochlorothiazide (HYDRODIURIL) 25 MG tablet Take 25 mg by mouth daily.    Marland Kitchen HYDROcodone-acetaminophen (NORCO) 10-325 MG tablet Take 1 tablet by mouth every 4 (four) hours as needed for moderate pain.    Marland Kitchen latanoprost (XALATAN) 0.005 % ophthalmic solution Place 1 drop into both eyes every other day. At night    . meloxicam (MOBIC) 15 MG tablet Take 15 mg by mouth daily.    Marland Kitchen olmesartan (BENICAR) 40 MG tablet Take 40 mg by mouth daily.    .  phenytoin (DILANTIN) 100 MG ER capsule Take 300 mg by mouth daily.    . predniSONE (DELTASONE) 5 MG tablet Take 1 tablet (5 mg total) by mouth daily with breakfast. 30 tablet 3  . verapamil (VERELAN PM) 360 MG 24 hr capsule Take 360 mg by mouth daily.     No current facility-administered medications for this visit.     PHYSICAL EXAMINATION: ECOG PERFORMANCE STATUS: 3 - Symptomatic, >50% confined to bed  Vitals:   12/04/20 1511  BP: (!) 150/56  Pulse: 68  Resp: 18  Temp: (!) 97.5 F (36.4 C)  SpO2: 99%   Filed Weights   12/04/20 1511  Weight: 163 lb 12.8 oz (74.3 kg)   Physical Exam Constitutional:      Appearance: Normal appearance.  HENT:     Head: Normocephalic and atraumatic.  Cardiovascular:     Rate and Rhythm: Normal rate and regular rhythm.     Pulses: Normal pulses.     Heart sounds: Normal heart sounds.  Pulmonary:     Effort: Pulmonary effort is normal.     Breath sounds: Normal breath sounds.  Abdominal:     General: Abdomen is flat. Bowel sounds are normal. There is no distension.     Palpations: Abdomen is soft. There is no mass.  Musculoskeletal:        General: No swelling or tenderness. Normal range of motion.     Cervical back: Normal range of motion and neck supple.  Lymphadenopathy:     Cervical: No cervical adenopathy.  Skin:    General: Skin is warm and dry.  Neurological:     General: No focal deficit present.     Mental Status: He is alert.  Psychiatric:        Mood and Affect: Mood normal.        Behavior: Behavior normal.     LABORATORY DATA:  I have  reviewed the data as listed Lab Results  Component Value Date   WBC 9.8 12/04/2020   HGB 10.5 (L) 12/04/2020   HCT 31.1 (L) 12/04/2020   MCV 89.4 12/04/2020   PLT 248 12/04/2020     Chemistry      Component Value Date/Time   NA 141 12/04/2020 1544   K 3.3 (L) 12/04/2020 1544   CL 105 12/04/2020 1544   CO2 27 12/04/2020 1544   BUN 21 12/04/2020 1544   CREATININE 1.41 (H)  12/04/2020 1544      Component Value Date/Time   CALCIUM 9.1 12/04/2020 1544   ALKPHOS 76 12/04/2020 1544   AST 13 (L) 12/04/2020 1544   ALT 10 12/04/2020 1544   BILITOT 0.4 12/04/2020 1544      RADIOGRAPHIC STUDIES: I have personally reviewed the radiological images as listed and agreed with the findings in the report. DG Knee Complete 4 Views Right  Result Date: 11/29/2020 CLINICAL DATA:  Right knee pain for 5 days. EXAM: RIGHT KNEE - COMPLETE 4+ VIEW COMPARISON:  None. FINDINGS: Moderate tricompartmental degenerative changes but no acute fracture or osteochondral lesion. No chondrocalcinosis. No joint effusion. IMPRESSION: Tricompartmental degenerative changes but no acute bony findings or joint effusion. Electronically Signed   By: Marijo Sanes M.D.   On: 11/29/2020 15:04     0 Result Notes  Component 6 d ago  SURGICAL PATHOLOGY SURGICAL PATHOLOGY  CASE: [id number]  PATIENT: Elgie Mangold  Surgical Pathology Report      Clinical History: possible lymphoma (cm)      FINAL MICROSCOPIC DIAGNOSIS:   A. LYMPH NODE, LEFT RETROPERITONEAL, NEEDLE CORE BIOPSY:  - Metastatic prostate adenocarcinoma.  - See comment.   COMMENT:  Immunohistochemistry is strongly positive with prostein and  prostate-specific antigen.    GROSS DESCRIPTION:  Received fresh are soft white tissue which measure 0.7 and 0.9 cm in  length and each less than 0.1 cm diameter. The specimen is submitted in  toto. (GRP 09/25/2020)        All questions were answered. The patient knows to call the clinic with any problems, questions or concerns. I have spent a total of 30 minutes in the care of this patient including H and P review of medical records, documentation, counseling and coordination of care.    Benay Pike, MD 12/04/2020 4:11 PM

## 2020-12-05 LAB — PSA, TOTAL AND FREE
PSA, Free Pct: 20 %
PSA, Free: 0.16 ng/mL
Prostate Specific Ag, Serum: 0.8 ng/mL (ref 0.0–4.0)

## 2020-12-06 ENCOUNTER — Other Ambulatory Visit: Payer: Self-pay | Admitting: Hematology and Oncology

## 2020-12-16 DIAGNOSIS — Z0000 Encounter for general adult medical examination without abnormal findings: Secondary | ICD-10-CM | POA: Diagnosis not present

## 2020-12-19 ENCOUNTER — Other Ambulatory Visit (HOSPITAL_COMMUNITY): Payer: Self-pay

## 2020-12-20 ENCOUNTER — Other Ambulatory Visit: Payer: Self-pay | Admitting: Hematology and Oncology

## 2020-12-22 ENCOUNTER — Other Ambulatory Visit (HOSPITAL_COMMUNITY): Payer: Self-pay

## 2020-12-22 MED FILL — Abiraterone Acetate Tab 250 MG: ORAL | 30 days supply | Qty: 120 | Fill #0 | Status: AC

## 2020-12-22 MED FILL — Prednisone Tab 5 MG: ORAL | 30 days supply | Qty: 30 | Fill #0 | Status: AC

## 2020-12-23 DIAGNOSIS — M5136 Other intervertebral disc degeneration, lumbar region: Secondary | ICD-10-CM | POA: Diagnosis not present

## 2020-12-23 DIAGNOSIS — M1712 Unilateral primary osteoarthritis, left knee: Secondary | ICD-10-CM | POA: Diagnosis not present

## 2020-12-23 DIAGNOSIS — Z6824 Body mass index (BMI) 24.0-24.9, adult: Secondary | ICD-10-CM | POA: Diagnosis not present

## 2020-12-23 DIAGNOSIS — F17210 Nicotine dependence, cigarettes, uncomplicated: Secondary | ICD-10-CM | POA: Diagnosis not present

## 2020-12-23 DIAGNOSIS — Z9181 History of falling: Secondary | ICD-10-CM | POA: Diagnosis not present

## 2020-12-23 DIAGNOSIS — M792 Neuralgia and neuritis, unspecified: Secondary | ICD-10-CM | POA: Diagnosis not present

## 2020-12-23 DIAGNOSIS — M21372 Foot drop, left foot: Secondary | ICD-10-CM | POA: Diagnosis not present

## 2020-12-23 DIAGNOSIS — Z79899 Other long term (current) drug therapy: Secondary | ICD-10-CM | POA: Diagnosis not present

## 2020-12-30 ENCOUNTER — Other Ambulatory Visit (HOSPITAL_COMMUNITY): Payer: Self-pay

## 2020-12-30 DIAGNOSIS — I739 Peripheral vascular disease, unspecified: Secondary | ICD-10-CM | POA: Diagnosis not present

## 2020-12-30 DIAGNOSIS — R569 Unspecified convulsions: Secondary | ICD-10-CM | POA: Diagnosis not present

## 2020-12-30 DIAGNOSIS — I10 Essential (primary) hypertension: Secondary | ICD-10-CM | POA: Diagnosis not present

## 2020-12-30 DIAGNOSIS — G40909 Epilepsy, unspecified, not intractable, without status epilepticus: Secondary | ICD-10-CM | POA: Diagnosis not present

## 2020-12-30 DIAGNOSIS — Z720 Tobacco use: Secondary | ICD-10-CM | POA: Diagnosis not present

## 2020-12-30 DIAGNOSIS — C61 Malignant neoplasm of prostate: Secondary | ICD-10-CM | POA: Diagnosis not present

## 2020-12-31 ENCOUNTER — Other Ambulatory Visit (HOSPITAL_COMMUNITY): Payer: Self-pay

## 2021-01-01 ENCOUNTER — Telehealth: Payer: Self-pay

## 2021-01-01 ENCOUNTER — Other Ambulatory Visit: Payer: Self-pay

## 2021-01-01 ENCOUNTER — Inpatient Hospital Stay: Payer: Medicare HMO | Attending: Hematology and Oncology | Admitting: Hematology and Oncology

## 2021-01-01 ENCOUNTER — Encounter: Payer: Self-pay | Admitting: Hematology and Oncology

## 2021-01-01 ENCOUNTER — Inpatient Hospital Stay: Payer: Medicare HMO

## 2021-01-01 ENCOUNTER — Telehealth: Payer: Self-pay | Admitting: Hematology and Oncology

## 2021-01-01 VITALS — BP 162/89 | HR 77 | Temp 96.9°F | Resp 17 | Wt 168.8 lb

## 2021-01-01 DIAGNOSIS — F17210 Nicotine dependence, cigarettes, uncomplicated: Secondary | ICD-10-CM | POA: Diagnosis not present

## 2021-01-01 DIAGNOSIS — C61 Malignant neoplasm of prostate: Secondary | ICD-10-CM

## 2021-01-01 DIAGNOSIS — M129 Arthropathy, unspecified: Secondary | ICD-10-CM | POA: Diagnosis not present

## 2021-01-01 DIAGNOSIS — M25561 Pain in right knee: Secondary | ICD-10-CM | POA: Insufficient documentation

## 2021-01-01 DIAGNOSIS — Z791 Long term (current) use of non-steroidal anti-inflammatories (NSAID): Secondary | ICD-10-CM | POA: Diagnosis not present

## 2021-01-01 DIAGNOSIS — I10 Essential (primary) hypertension: Secondary | ICD-10-CM | POA: Diagnosis not present

## 2021-01-01 DIAGNOSIS — R350 Frequency of micturition: Secondary | ICD-10-CM | POA: Diagnosis not present

## 2021-01-01 DIAGNOSIS — C772 Secondary and unspecified malignant neoplasm of intra-abdominal lymph nodes: Secondary | ICD-10-CM | POA: Diagnosis not present

## 2021-01-01 DIAGNOSIS — Z79818 Long term (current) use of other agents affecting estrogen receptors and estrogen levels: Secondary | ICD-10-CM | POA: Diagnosis not present

## 2021-01-01 DIAGNOSIS — R591 Generalized enlarged lymph nodes: Secondary | ICD-10-CM | POA: Insufficient documentation

## 2021-01-01 LAB — CBC WITH DIFFERENTIAL/PLATELET
Abs Immature Granulocytes: 0.1 10*3/uL — ABNORMAL HIGH (ref 0.00–0.07)
Basophils Absolute: 0.1 10*3/uL (ref 0.0–0.1)
Basophils Relative: 1 %
Eosinophils Absolute: 0.3 10*3/uL (ref 0.0–0.5)
Eosinophils Relative: 3 %
HCT: 28.4 % — ABNORMAL LOW (ref 39.0–52.0)
Hemoglobin: 9.4 g/dL — ABNORMAL LOW (ref 13.0–17.0)
Immature Granulocytes: 1 %
Lymphocytes Relative: 17 %
Lymphs Abs: 1.4 10*3/uL (ref 0.7–4.0)
MCH: 30.1 pg (ref 26.0–34.0)
MCHC: 33.1 g/dL (ref 30.0–36.0)
MCV: 91 fL (ref 80.0–100.0)
Monocytes Absolute: 0.6 10*3/uL (ref 0.1–1.0)
Monocytes Relative: 7 %
Neutro Abs: 5.9 10*3/uL (ref 1.7–7.7)
Neutrophils Relative %: 71 %
Platelets: 264 10*3/uL (ref 150–400)
RBC: 3.12 MIL/uL — ABNORMAL LOW (ref 4.22–5.81)
RDW: 13.2 % (ref 11.5–15.5)
WBC: 8.3 10*3/uL (ref 4.0–10.5)
nRBC: 0 % (ref 0.0–0.2)

## 2021-01-01 LAB — CMP (CANCER CENTER ONLY)
ALT: 7 U/L (ref 0–44)
AST: 14 U/L — ABNORMAL LOW (ref 15–41)
Albumin: 3.9 g/dL (ref 3.5–5.0)
Alkaline Phosphatase: 69 U/L (ref 38–126)
Anion gap: 11 (ref 5–15)
BUN: 14 mg/dL (ref 8–23)
CO2: 27 mmol/L (ref 22–32)
Calcium: 8.8 mg/dL — ABNORMAL LOW (ref 8.9–10.3)
Chloride: 104 mmol/L (ref 98–111)
Creatinine: 1.26 mg/dL — ABNORMAL HIGH (ref 0.61–1.24)
GFR, Estimated: 60 mL/min — ABNORMAL LOW (ref >60)
Glucose, Bld: 143 mg/dL — ABNORMAL HIGH (ref 70–99)
Potassium: 3.4 mmol/L — ABNORMAL LOW (ref 3.5–5.1)
Sodium: 142 mmol/L (ref 135–145)
Total Bilirubin: 0.3 mg/dL (ref 0.3–1.2)
Total Protein: 7 g/dL (ref 6.5–8.1)

## 2021-01-01 NOTE — Telephone Encounter (Signed)
Unable to get in touch with patient in regards to his appointment today with Dr. Chryl Heck. Unable to leave a voicemail d/t busy tone.

## 2021-01-01 NOTE — Progress Notes (Signed)
[place] CONSULT NOTE  Patient Care Team: Iona Beard, MD as PCP - General (Family Medicine)  CHIEF COMPLAINTS/PURPOSE OF CONSULTATION:  Abnormal imaging.  ASSESSMENT & PLAN:   No problem-specific Assessment & Plan notes found for this encounter.  No orders of the defined types were placed in this encounter.  This is a very pleasant 75 yr old male patient with PMH significant for prostate adenocarcinoma Gleason 3+4, no LN involvement s.p robotic prostatectomy in 2013, presented with peri aortic left iliac and peri rectal LN, with biopsy confirming metastatic prostate adenoca now on zytiga and lupron presents here for FU on his metastatic prostate cancer. Baseline PSA of 207 He is doing well except for right knee pain after a fall. He went to the ED, had some X rays no fractures. He has been taking some pain medication which he uses for his back pain, it looks like he has been referred to orthopaedics, Dr Griffin Basil, We will call their office and try to obtain results. He had no bone mets from prostate cancer on prior imaging, but most of our scans do not extend below mid thigh. Hence I ordered bone scan today as well which will be scheduled in next 1-2 weeks. We gave him central scheduling number to call and schedule.  He otherwise is tolerating zytiga very well, NO complaints. Labs from today pending.  At this time plan is to continue zytiga, lupron and prednisone, Lupron scheduled for 4/28  He will RTC in one month.  Thank you for consulting [place] in the care of this patient.  Please do not hesitate to contact us with any additional questions or concerns.  HISTORY OF PRESENTING ILLNESS:   Joseph Rangel 75 y.o. male is here because of some abnormal lymphadenopathy identified on CT angiogram especially given his history of prostate cancer.  He had robotic-assisted laparoscopic radical retropubic prostatectomy with bilateral pelvic lymph node dissection for a T1c prostate  adenocarcinoma. Final pathology from this surgery showed prostatic adenocarcinoma , gleasons score 3+4=7, involving both lobes, no evidence of angiolymphatic invasion, extraprostatic extension or seminal vesicle involvement. All resection margins are clear.  CT showed periaortic, left iliac, peri rectal LAD concerning for metastatic disease, and hence referred to Oncology.  He no showed to his first appointment given lack of transport, and came an hr late for his second appointment. He is here with his wife, he tells me that some LN were enlarged on a CT scan and his doctor wants to make sure he doesn't have cancer again. He denies any changes to his pain, has chronic back pain and pain in his legs for which he takes pain medication and FU with his PCP and pain management. PSA of 207, PET imaging with adenopathy in the abdomen and pelvis in this patient with elevated PSA showing low level FDG uptake most suggestive of prostate metastases. Other disease processes that could have this distribution rectal cancer and lymphoma. The lack of pronounced FDG uptake in these lymph nodes and the reported PSA level make these less likely. Biopsy of RPLN confirmed prostate adenoca. He started taking casodex and had his first lupron on 10/18/2020 He started taking zytiga but was only taking one tab a day, despite instructions to take 4 a day. Although there is some new literature suggesting to take one tab if you eat a fatty meal, it is not reliable if he would end up eating a fatty meal every day, and also give concomitant use of phenytoin which  is an inducer, in theory he may need more than 4 tabs a day. But given his age and PS, we recommended continuing 1000 mg of zytiga daily  Interim History  Patient is here for follow-up. He continues to have pain in the right leg, around his knee, Its mostly when he puts pressure on the knee. No back pain, no leg pain. He is frustrated about the leg pain.  He otherwise  again has no nausea, vomiting, diarrhea from Zytiga.  He has good appetite.  Rest of the pertinent 10 point ROS reviewed and negative.  MEDICAL HISTORY:  Past Medical History:  Diagnosis Date  . Arthritis   . Cancer [facility])    prostate   . Frequency of urination   . Hypertension   . Seizures ([place])    after brain surg - none in past 20 yrs  . 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 BRAIN SURGERY     BLOOD CLOT REMOVED 1980  . MASS EXCISION  02/24/2012   Procedure: MINOR EXCISION OF MASS;  Surgeon: Bernestine Amass, MD;  Location: [facility];  Service: Urology;;  excision of neck lesion  . ROBOT ASSISTED LAPAROSCOPIC RADICAL PROSTATECTOMY  02/24/2012   Procedure: ROBOTIC ASSISTED LAPAROSCOPIC RADICAL PROSTATECTOMY;  Surgeon: Bernestine Amass, MD;  Location: [facility];  Service: Urology;  Laterality: N/A;           SOCIAL HISTORY: Social History   Socioeconomic History  . Marital status: Married    Spouse name: Not on file  . Number of children: Not on file  . Years of education: Not on file  . Highest education level: Not on file  Occupational History  . Not on file  Tobacco Use  . Smoking status: Current Every Day Smoker    Types: Cigarettes  . Smokeless tobacco: Never Used  . Tobacco comment: 2 cigarettes per day  Vaping Use  . Vaping Use: Never used  Substance and Sexual Activity  . Alcohol use: No  . Drug use: No  . Sexual activity: Not on file  Other Topics Concern  . Not on file  Social History Narrative  . Not on file   Social Determinants of Health   Financial Resource Strain: Not on file  Food Insecurity: Not on file  Transportation Needs: Not on file  Physical Activity: Not on file  Stress: Not on file  Social Connections: Not on file  Intimate Partner Violence: Not on file    FAMILY HISTORY: No family history on file.  ALLERGIES:  has No Known Allergies.  MEDICATIONS:  Current  Outpatient Medications  Medication Sig Dispense Refill  . abiraterone acetate (ZYTIGA) 250 MG tablet TAKE 4 TABLETS (1,000 MG TOTAL) BY MOUTH DAILY. TAKE ON AN EMPTY STOMACH 1 HOUR BEFORE OR 2 HOURS AFTER A MEAL 120 tablet 0  . atenolol (TENORMIN) 25 MG tablet Take 25 mg by mouth daily.    . cilostazol (PLETAL) 50 MG tablet Take 50 mg by mouth daily.    . fenofibrate (TRICOR) 48 MG tablet Take 48 mg by mouth daily.    . hydrochlorothiazide (HYDRODIURIL) 25 MG tablet Take 25 mg by mouth daily.    Marland Kitchen HYDROcodone-acetaminophen (NORCO) 10-325 MG tablet Take 1 tablet by mouth every 4 (four) hours as needed for moderate pain.    Marland Kitchen latanoprost (XALATAN) 0.005 % ophthalmic solution Place 1 drop into both eyes every  other day. At night    . meloxicam (MOBIC) 15 MG tablet Take 15 mg by mouth daily.    Marland Kitchen olmesartan (BENICAR) 40 MG tablet Take 40 mg by mouth daily.    . phenytoin (DILANTIN) 100 MG ER capsule Take 300 mg by mouth daily.    . predniSONE (DELTASONE) 5 MG tablet TAKE 1 TABLET BY MOUTH ONCE A DAY WITH BREAKFAST 30 tablet 3  . verapamil (VERELAN PM) 360 MG 24 hr capsule Take 360 mg by mouth daily.     No current facility-administered medications for this visit.     PHYSICAL EXAMINATION: ECOG PERFORMANCE STATUS: 3 - Symptomatic, >50% confined to bed  Vitals:   01/01/21 1532  BP: (!) 162/89  Pulse: 77  Resp: 17  Temp: (!) 96.9 F (36.1 C)  SpO2: 99%   Filed Weights   01/01/21 1532  Weight: 168 lb 12.8 oz (76.6 kg)   Physical Exam Constitutional:      Appearance: Normal appearance.  HENT:     Head: Normocephalic and atraumatic.  Cardiovascular:     Rate and Rhythm: Normal rate and regular rhythm.     Pulses: Normal pulses.     Heart sounds: Normal heart sounds.  Pulmonary:     Effort: Pulmonary effort is normal.     Breath sounds: Normal breath sounds.  Abdominal:     General: Abdomen is flat. Bowel sounds are normal. There is no distension.     Palpations: Abdomen is  soft. There is no mass.  Musculoskeletal:        General: No swelling or tenderness. Normal range of motion.     Cervical back: Normal range of motion and neck supple.     Right lower leg: No edema.     Left lower leg: No edema.     Comments: He does notice pain when he tries to stand or walk on it. He has a cane.  Lymphadenopathy:     Cervical: No cervical adenopathy.  Skin:    General: Skin is warm and dry.  Neurological:     General: No focal deficit present.     Mental Status: He is alert.  Psychiatric:        Mood and Affect: Mood normal.        Behavior: Behavior normal.     LABORATORY DATA:  I have reviewed the data as listed Lab Results  Component Value Date   WBC 9.8 12/04/2020   HGB 10.5 (L) 12/04/2020   HCT 31.1 (L) 12/04/2020   MCV 89.4 12/04/2020   PLT 248 12/04/2020     Chemistry      Component Value Date/Time   NA 141 12/04/2020 1544   K 3.3 (L) 12/04/2020 1544   CL 105 12/04/2020 1544   CO2 27 12/04/2020 1544   BUN 21 12/04/2020 1544   CREATININE 1.41 (H) 12/04/2020 1544      Component Value Date/Time   CALCIUM 9.1 12/04/2020 1544   ALKPHOS 76 12/04/2020 1544   AST 13 (L) 12/04/2020 1544   ALT 10 12/04/2020 1544   BILITOT 0.4 12/04/2020 1544      RADIOGRAPHIC STUDIES: I have personally reviewed the radiological images as listed and agreed with the findings in the report. No results found.   0 Result Notes  Component 6 d ago  SURGICAL PATHOLOGY SURGICAL PATHOLOGY  CASE: [id number]  PATIENT: Joseph Rangel  Surgical Pathology Report      Clinical History: possible lymphoma (cm)  FINAL MICROSCOPIC DIAGNOSIS:   A. LYMPH NODE, LEFT RETROPERITONEAL, NEEDLE CORE BIOPSY:  - Metastatic prostate adenocarcinoma.  - See comment.   COMMENT:  Immunohistochemistry is strongly positive with prostein and  prostate-specific antigen.    GROSS DESCRIPTION:  Received fresh are soft white tissue which measure 0.7 and 0.9 cm in  length  and each less than 0.1 cm diameter. The specimen is submitted in  toto. (GRP 09/25/2020)        All questions were answered. The patient knows to call the clinic with any problems, questions or concerns. I have spent a total of 30 minutes in the care of this patient including H and P review of medical records, documentation, counseling and coordination of care.    Benay Pike, MD 01/01/2021 3:33 PM

## 2021-01-01 NOTE — Telephone Encounter (Signed)
Scheduled per los. Gave avs and calendar  

## 2021-01-02 LAB — PSA, TOTAL AND FREE
PSA, Free Pct: 25 %
PSA, Free: 0.05 ng/mL
Prostate Specific Ag, Serum: 0.2 ng/mL (ref 0.0–4.0)

## 2021-01-14 IMAGING — CT CT BIOPSY
1 of 3 series · 14 of 32 positions shown, 19 images · non-contrast
Comparison: none

INDICATION: 74-year-old male referred for biopsy of retroperitoneal lymph node

[Series 2: i-spiral 5.0 b40f · axial · 0.72mm/px · z∈[+873,+1030]mm · 14 of 51 slices shown, 19 images]
[im 3/51  soft-tissue]
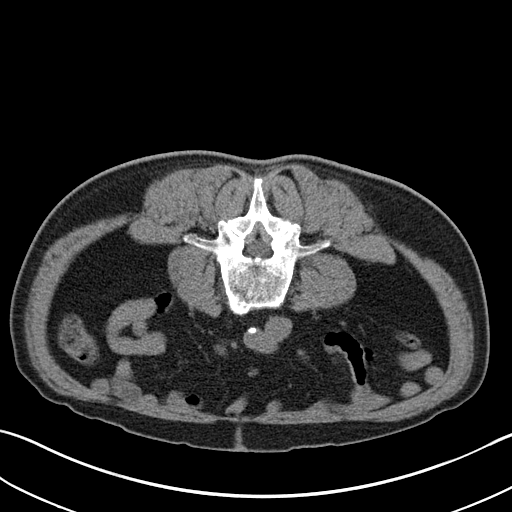
[im 3/51  bone]
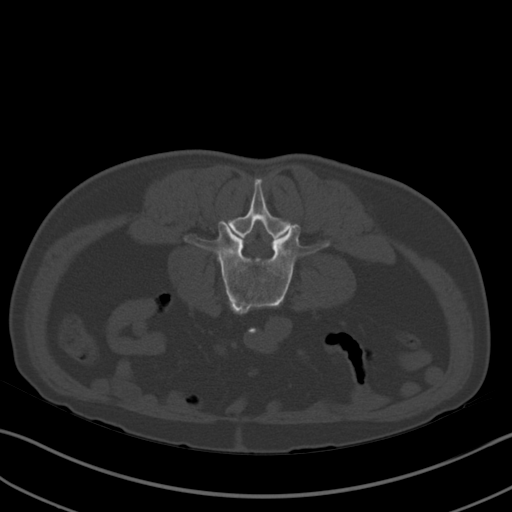
[im 6/51  soft-tissue]
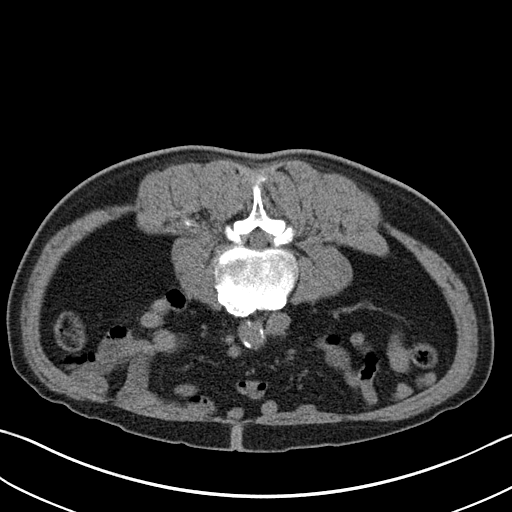
[im 12/51  soft-tissue]
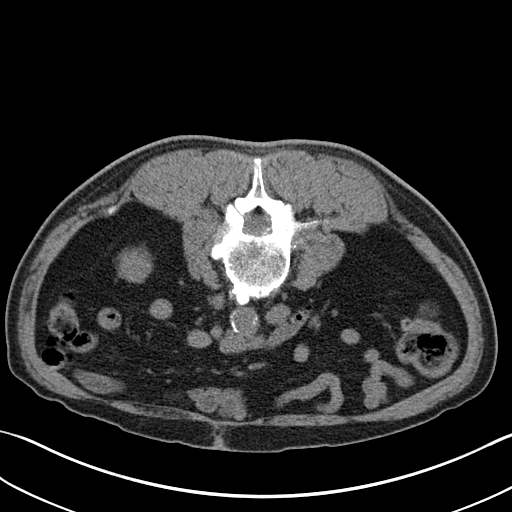
[im 15/51  soft-tissue]
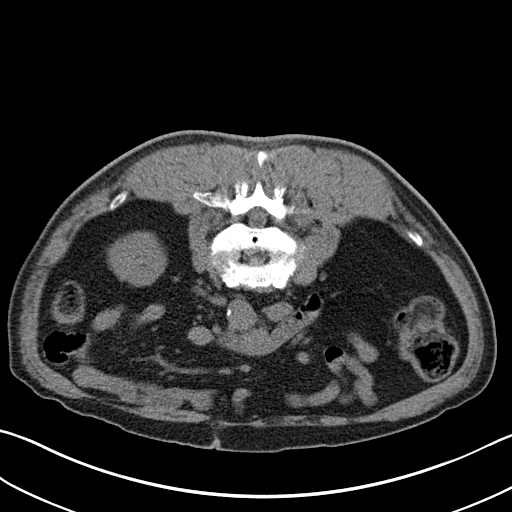
[im 18/51  soft-tissue]
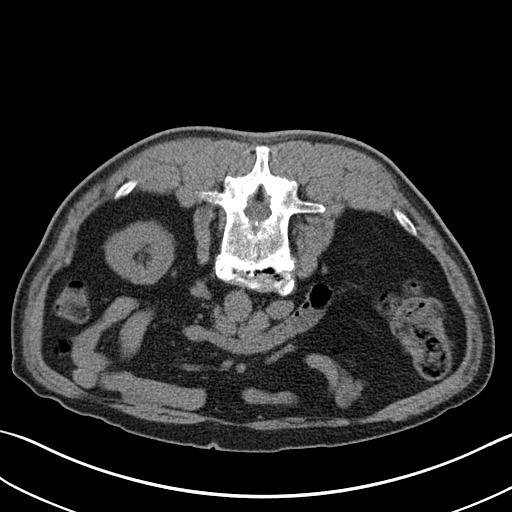
[im 21/51  soft-tissue]
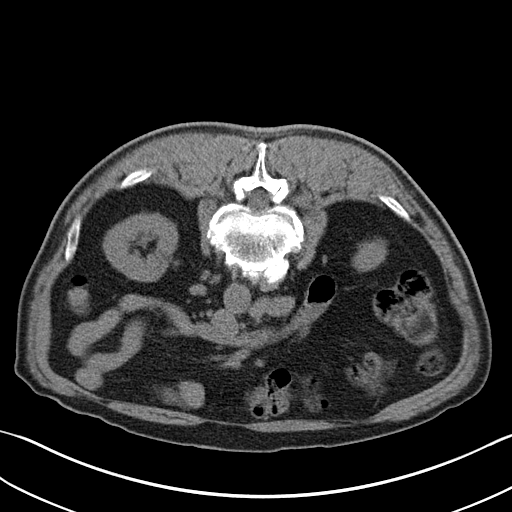
[im 27/51  soft-tissue]
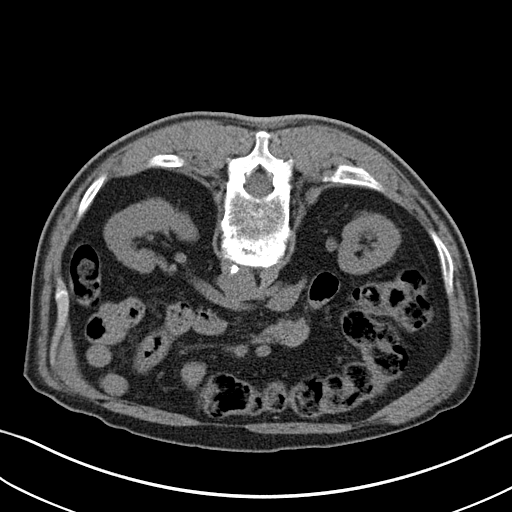
[im 30/51  soft-tissue]
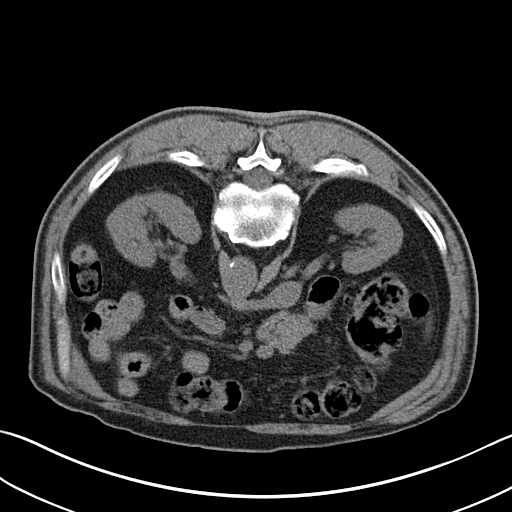
[im 33/51  soft-tissue]
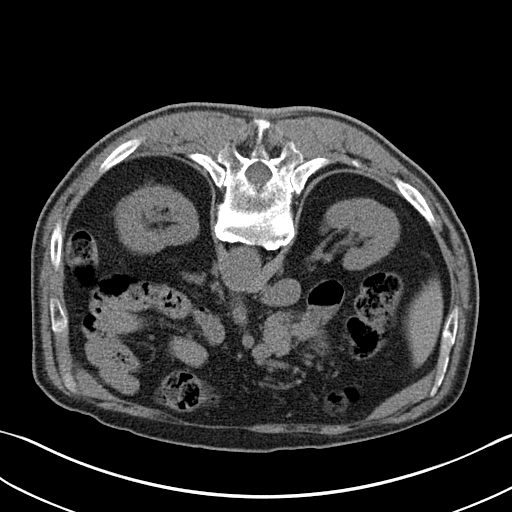
[im 33/51  bone]
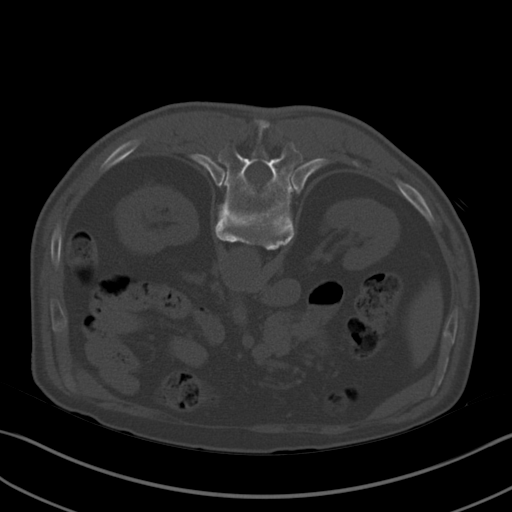
[im 36/51  soft-tissue]
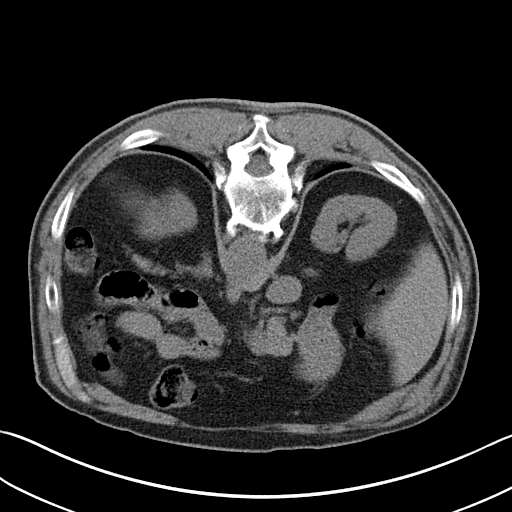
[im 39/51  soft-tissue]
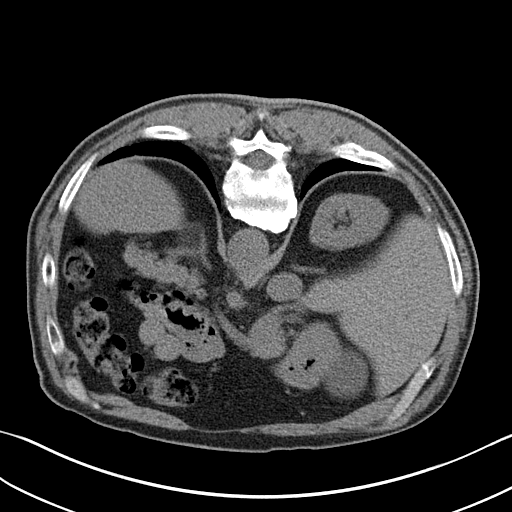
[im 39/51  lung]
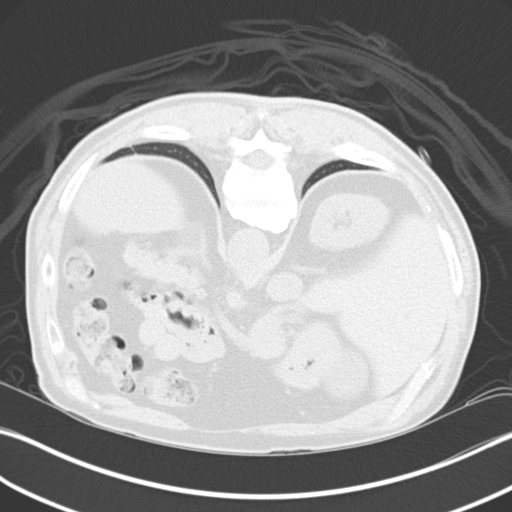
[im 42/51  lung]
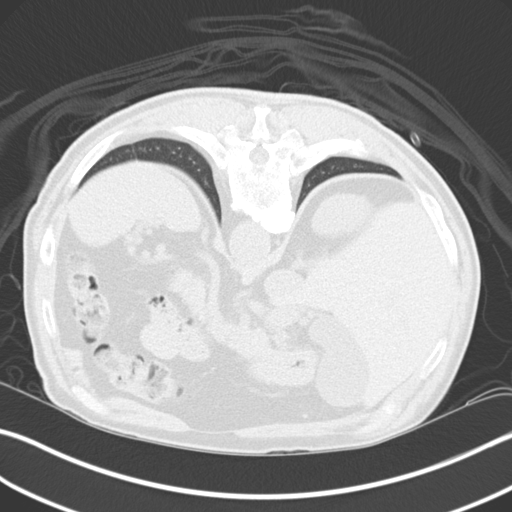
[im 45/51  soft-tissue]
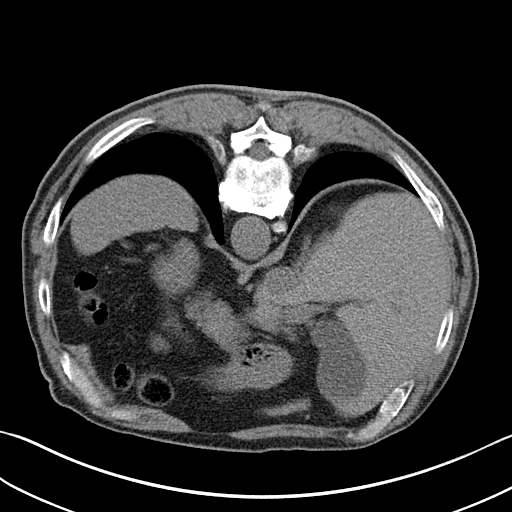
[im 45/51  lung]
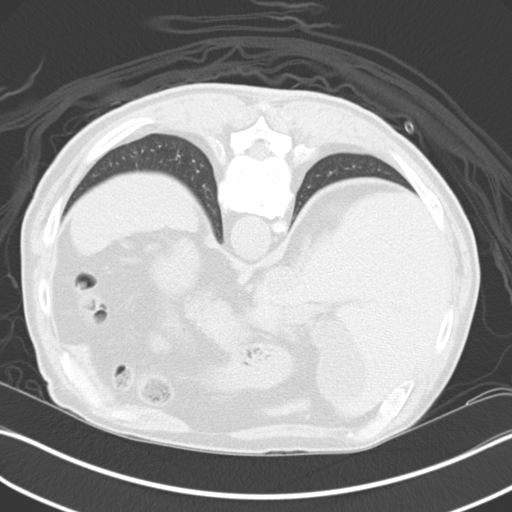
[im 48/51  soft-tissue]
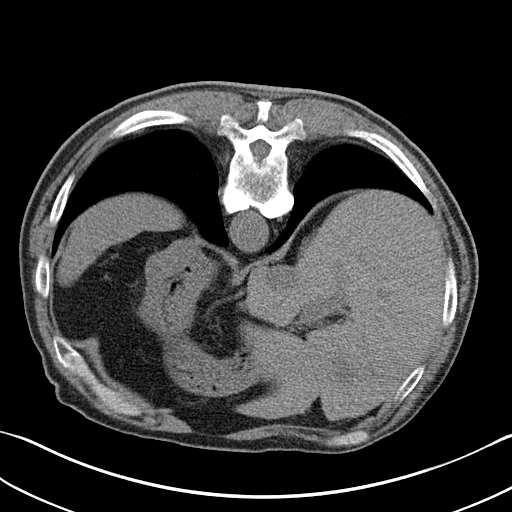
[im 48/51  lung]
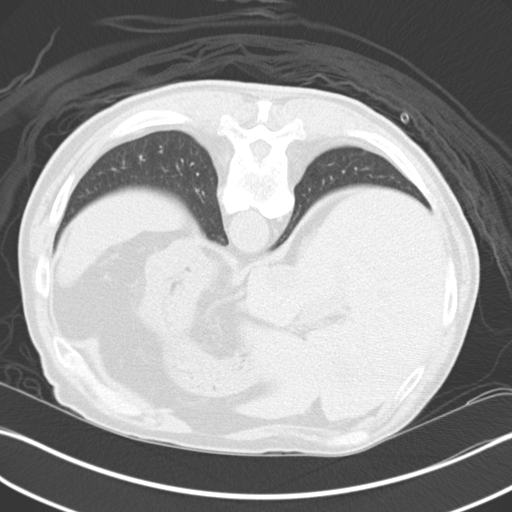

[14 of 32 positions shown; findings below may reference images not displayed]

EXAM:
CT BIOPSY

MEDICATIONS:
None.

ANESTHESIA/SEDATION:
Moderate (conscious) sedation was employed during this procedure. A
total of Versed 1.0 mg and Fentanyl 50 mcg was administered
intravenously.

Moderate Sedation Time: 19 minutes. The patient's level of
consciousness and vital signs were monitored continuously by
radiology nursing throughout the procedure under my direct
supervision.

FLUOROSCOPY TIME:  CT

COMPLICATIONS:
None

PROCEDURE:
Informed written consent was obtained from the patient after a
thorough discussion of the procedural risks, benefits and
alternatives. All questions were addressed. Maximal Sterile Barrier
Technique was utilized including caps, mask, sterile gowns, sterile
gloves, sterile drape, hand hygiene and skin antiseptic. A timeout
was performed prior to the initiation of the procedure.

Patient position prone position on the CT gantry table. Scout CT
images were acquired.

From a left posterior abdominal approach, we targeted a
retroperitoneal lymph node with 17 gauge guide needle. Once we
confirmed the guide needle in location, multiple 18 gauge core
biopsy were performed.

Specimen placed into saline.

Needle was removed and a final image was stored.

Patient tolerated the procedure well and remained hemodynamically
stable throughout.

No complications were encountered and no significant blood loss.
IMPRESSION: Status post CT-guided biopsy retroperitoneal lymph node.

## 2021-01-16 ENCOUNTER — Inpatient Hospital Stay: Payer: Medicare HMO

## 2021-01-16 ENCOUNTER — Other Ambulatory Visit: Payer: Self-pay

## 2021-01-16 VITALS — BP 164/84 | HR 81 | Temp 98.8°F | Resp 17

## 2021-01-16 DIAGNOSIS — F17210 Nicotine dependence, cigarettes, uncomplicated: Secondary | ICD-10-CM | POA: Diagnosis not present

## 2021-01-16 DIAGNOSIS — R591 Generalized enlarged lymph nodes: Secondary | ICD-10-CM | POA: Diagnosis not present

## 2021-01-16 DIAGNOSIS — I10 Essential (primary) hypertension: Secondary | ICD-10-CM | POA: Diagnosis not present

## 2021-01-16 DIAGNOSIS — C61 Malignant neoplasm of prostate: Secondary | ICD-10-CM | POA: Diagnosis not present

## 2021-01-16 DIAGNOSIS — R350 Frequency of micturition: Secondary | ICD-10-CM | POA: Diagnosis not present

## 2021-01-16 DIAGNOSIS — Z79818 Long term (current) use of other agents affecting estrogen receptors and estrogen levels: Secondary | ICD-10-CM | POA: Diagnosis not present

## 2021-01-16 DIAGNOSIS — Z791 Long term (current) use of non-steroidal anti-inflammatories (NSAID): Secondary | ICD-10-CM | POA: Diagnosis not present

## 2021-01-16 DIAGNOSIS — C772 Secondary and unspecified malignant neoplasm of intra-abdominal lymph nodes: Secondary | ICD-10-CM

## 2021-01-16 DIAGNOSIS — M129 Arthropathy, unspecified: Secondary | ICD-10-CM | POA: Diagnosis not present

## 2021-01-16 DIAGNOSIS — M25561 Pain in right knee: Secondary | ICD-10-CM | POA: Diagnosis not present

## 2021-01-16 MED ORDER — LEUPROLIDE ACETATE (3 MONTH) 22.5 MG ~~LOC~~ KIT
22.5000 mg | PACK | Freq: Once | SUBCUTANEOUS | Status: AC
  Administered 2021-01-16: 22.5 mg via SUBCUTANEOUS

## 2021-01-16 MED ORDER — LEUPROLIDE ACETATE (3 MONTH) 22.5 MG ~~LOC~~ KIT
PACK | SUBCUTANEOUS | Status: AC
  Filled 2021-01-16: qty 22.5

## 2021-01-16 NOTE — Patient Instructions (Signed)

## 2021-01-20 ENCOUNTER — Other Ambulatory Visit: Payer: Self-pay

## 2021-01-20 ENCOUNTER — Encounter (HOSPITAL_COMMUNITY)
Admission: RE | Admit: 2021-01-20 | Discharge: 2021-01-20 | Disposition: A | Discharge status: Home / Self Care | Payer: Medicare HMO | Source: Ambulatory Visit | Attending: Hematology and Oncology | Admitting: Hematology and Oncology

## 2021-01-20 DIAGNOSIS — C61 Malignant neoplasm of prostate: Secondary | ICD-10-CM | POA: Insufficient documentation

## 2021-01-20 DIAGNOSIS — M25551 Pain in right hip: Secondary | ICD-10-CM | POA: Diagnosis not present

## 2021-01-20 DIAGNOSIS — Z8546 Personal history of malignant neoplasm of prostate: Secondary | ICD-10-CM | POA: Diagnosis not present

## 2021-01-20 DIAGNOSIS — C772 Secondary and unspecified malignant neoplasm of intra-abdominal lymph nodes: Secondary | ICD-10-CM | POA: Insufficient documentation

## 2021-01-20 DIAGNOSIS — I10 Essential (primary) hypertension: Secondary | ICD-10-CM | POA: Diagnosis not present

## 2021-01-20 DIAGNOSIS — M792 Neuralgia and neuritis, unspecified: Secondary | ICD-10-CM | POA: Diagnosis not present

## 2021-01-20 DIAGNOSIS — M5136 Other intervertebral disc degeneration, lumbar region: Secondary | ICD-10-CM | POA: Diagnosis not present

## 2021-01-20 DIAGNOSIS — M19072 Primary osteoarthritis, left ankle and foot: Secondary | ICD-10-CM | POA: Diagnosis not present

## 2021-01-20 DIAGNOSIS — M21372 Foot drop, left foot: Secondary | ICD-10-CM | POA: Diagnosis not present

## 2021-01-20 DIAGNOSIS — M19012 Primary osteoarthritis, left shoulder: Secondary | ICD-10-CM | POA: Diagnosis not present

## 2021-01-20 DIAGNOSIS — M1712 Unilateral primary osteoarthritis, left knee: Secondary | ICD-10-CM | POA: Diagnosis not present

## 2021-01-20 DIAGNOSIS — Z6825 Body mass index (BMI) 25.0-25.9, adult: Secondary | ICD-10-CM | POA: Diagnosis not present

## 2021-01-20 DIAGNOSIS — F17210 Nicotine dependence, cigarettes, uncomplicated: Secondary | ICD-10-CM | POA: Diagnosis not present

## 2021-01-20 DIAGNOSIS — Z9181 History of falling: Secondary | ICD-10-CM | POA: Diagnosis not present

## 2021-01-20 MED ORDER — TECHNETIUM TC 99M MEDRONATE IV KIT
20.5000 | PACK | Freq: Once | INTRAVENOUS | Status: AC | PRN
  Administered 2021-01-20: 20.5 via INTRAVENOUS

## 2021-01-24 ENCOUNTER — Other Ambulatory Visit: Payer: Self-pay | Admitting: Hematology and Oncology

## 2021-01-24 ENCOUNTER — Other Ambulatory Visit (HOSPITAL_COMMUNITY): Payer: Self-pay

## 2021-01-24 MED ORDER — PREDNISONE 5 MG PO TABS
ORAL_TABLET | Freq: Every day | ORAL | 3 refills | Status: DC
  Filled 2021-01-24: qty 30, 30d supply, fill #0
  Filled 2021-03-17: qty 30, 30d supply, fill #1
  Filled 2021-03-26: qty 30, 30d supply, fill #2
  Filled 2021-05-07: qty 30, 30d supply, fill #3

## 2021-01-24 MED ORDER — ABIRATERONE ACETATE 250 MG PO TABS
1000.0000 mg | ORAL_TABLET | Freq: Every day | ORAL | 0 refills | Status: DC
  Filled 2021-01-24: qty 120, 30d supply, fill #0

## 2021-01-27 ENCOUNTER — Other Ambulatory Visit (HOSPITAL_COMMUNITY): Payer: Self-pay

## 2021-01-28 ENCOUNTER — Other Ambulatory Visit (HOSPITAL_COMMUNITY): Payer: Self-pay

## 2021-01-29 ENCOUNTER — Inpatient Hospital Stay: Payer: Medicare HMO

## 2021-01-29 ENCOUNTER — Other Ambulatory Visit: Payer: Self-pay

## 2021-01-29 ENCOUNTER — Inpatient Hospital Stay: Payer: Medicare HMO | Attending: Hematology and Oncology | Admitting: Hematology and Oncology

## 2021-01-29 VITALS — BP 127/77 | HR 85 | Temp 97.9°F | Resp 18

## 2021-01-29 DIAGNOSIS — Z8669 Personal history of other diseases of the nervous system and sense organs: Secondary | ICD-10-CM | POA: Diagnosis not present

## 2021-01-29 DIAGNOSIS — M129 Arthropathy, unspecified: Secondary | ICD-10-CM | POA: Diagnosis not present

## 2021-01-29 DIAGNOSIS — C61 Malignant neoplasm of prostate: Secondary | ICD-10-CM | POA: Diagnosis not present

## 2021-01-29 DIAGNOSIS — C772 Secondary and unspecified malignant neoplasm of intra-abdominal lymph nodes: Secondary | ICD-10-CM

## 2021-01-29 DIAGNOSIS — C775 Secondary and unspecified malignant neoplasm of intrapelvic lymph nodes: Secondary | ICD-10-CM | POA: Insufficient documentation

## 2021-01-29 DIAGNOSIS — N179 Acute kidney failure, unspecified: Secondary | ICD-10-CM | POA: Insufficient documentation

## 2021-01-29 DIAGNOSIS — F17210 Nicotine dependence, cigarettes, uncomplicated: Secondary | ICD-10-CM | POA: Diagnosis not present

## 2021-01-29 DIAGNOSIS — M79604 Pain in right leg: Secondary | ICD-10-CM | POA: Diagnosis not present

## 2021-01-29 LAB — CMP (CANCER CENTER ONLY)
ALT: <6 U/L (ref 0–44)
AST: 13 U/L — ABNORMAL LOW (ref 15–41)
Albumin: 3.7 g/dL (ref 3.5–5.0)
Alkaline Phosphatase: 91 U/L (ref 38–126)
Anion gap: 13 (ref 5–15)
BUN: 39 mg/dL — ABNORMAL HIGH (ref 8–23)
CO2: 26 mmol/L (ref 22–32)
Calcium: 8.9 mg/dL (ref 8.9–10.3)
Chloride: 103 mmol/L (ref 98–111)
Creatinine: 2.63 mg/dL — ABNORMAL HIGH (ref 0.61–1.24)
GFR, Estimated: 25 mL/min — ABNORMAL LOW (ref >60)
Glucose, Bld: 168 mg/dL — ABNORMAL HIGH (ref 70–99)
Potassium: 2.9 mmol/L — ABNORMAL LOW (ref 3.5–5.1)
Sodium: 142 mmol/L (ref 135–145)
Total Bilirubin: 0.3 mg/dL (ref 0.3–1.2)
Total Protein: 7.2 g/dL (ref 6.5–8.1)

## 2021-01-29 LAB — CBC WITH DIFFERENTIAL/PLATELET
Abs Immature Granulocytes: 0.04 10*3/uL (ref 0.00–0.07)
Basophils Absolute: 0.1 10*3/uL (ref 0.0–0.1)
Basophils Relative: 1 %
Eosinophils Absolute: 0.3 10*3/uL (ref 0.0–0.5)
Eosinophils Relative: 2 %
HCT: 30.4 % — ABNORMAL LOW (ref 39.0–52.0)
Hemoglobin: 10 g/dL — ABNORMAL LOW (ref 13.0–17.0)
Immature Granulocytes: 0 %
Lymphocytes Relative: 14 %
Lymphs Abs: 1.6 10*3/uL (ref 0.7–4.0)
MCH: 29.7 pg (ref 26.0–34.0)
MCHC: 32.9 g/dL (ref 30.0–36.0)
MCV: 90.2 fL (ref 80.0–100.0)
Monocytes Absolute: 0.6 10*3/uL (ref 0.1–1.0)
Monocytes Relative: 6 %
Neutro Abs: 8.8 10*3/uL — ABNORMAL HIGH (ref 1.7–7.7)
Neutrophils Relative %: 77 %
Platelets: 314 10*3/uL (ref 150–400)
RBC: 3.37 MIL/uL — ABNORMAL LOW (ref 4.22–5.81)
RDW: 13 % (ref 11.5–15.5)
WBC: 11.4 10*3/uL — ABNORMAL HIGH (ref 4.0–10.5)
nRBC: 0 % (ref 0.0–0.2)

## 2021-01-29 NOTE — Progress Notes (Signed)
Joseph Rangel CONSULT NOTE  Patient Care Team: Joseph Beard, MD as PCP - General (Family Medicine)  CHIEF COMPLAINTS/PURPOSE OF CONSULTATION:  Abnormal imaging.  ASSESSMENT & PLAN:   Prostate cancer metastatic to intraabdominal lymph node [facility]) This is a very pleasant 75 yr old male patient with PMH significant for prostate adenocarcinoma Gleason 3+4, no LN involvement s.p robotic prostatectomy in 2013, presented with peri aortic left iliac and peri rectal LN, with biopsy confirming metastatic prostate adenoca now on zytiga and lupron presents here for FU on his metastatic prostate cancer. Baseline PSA of 207.  He is here for FU on Zytiga/prednisone/lupron. Most recent bone scan with no concern for bone mets. We will continue current treatment, next lupron in July.  AKI (acute kidney injury) ([place]) Labs with AKI, unclear etiology. I tried to call Dr Joseph Rangel office about this AKI, encouraged the patient to hydrate himself well, hold HCTZ, return to clinic with repeat labs on Monday I also ordered UA, US renal to further evaluate AKI I left my phone number for Dr Joseph Rangel to call me back. Our nursing team will help assist schedule [place], labs and infusion for Monday.  Right leg pain No concern for mets on bone scan. I have many times encouraged that he set up ortho appointment, knee pain could be referred from hip or back. He was given phone number last time to call ortho, encouraged to set this up.  Orders Placed This Encounter  Procedures  . US RENAL    Standing Status:   Future    Standing Expiration Date:   01/31/2022    Order Specific Question:   Reason for Exam (SYMPTOM  OR DIAGNOSIS REQUIRED)    Answer:   concern for hydronephrosis, sudden onset AKI    Order Specific Question:   Preferred imaging location?    Answer:   [facility]  . CBC with Differential/Platelet    Standing Status:   Standing    Number of Occurrences:   22    Standing Expiration Date:    01/29/2022  . CMP ([place] only)    Standing Status:   Future    Number of Occurrences:   1    Standing Expiration Date:   01/29/2022  . PSA, total and free    Standing Status:   Future    Number of Occurrences:   1    Standing Expiration Date:   01/29/2022  . Urinalysis, Complete w Microscopic    Standing Status:   Future    Standing Expiration Date:   01/31/2022  . Urine, Sodium    Standing Status:   Future    Standing Expiration Date:   01/31/2022  . CBC with Differential/Platelet    Standing Status:   Standing    Number of Occurrences:   22    Standing Expiration Date:   01/31/2022  . Basic Metabolic Panel - [place] Only    Standing Status:   Future    Standing Expiration Date:   01/31/2022    HISTORY OF PRESENTING ILLNESS:   Oncology History Overview Note  Joseph Rangel 75 y.o. male is here because of some abnormal lymphadenopathy identified on CT angiogram especially given his history of prostate cancer.  He had robotic-assisted laparoscopic radical retropubic prostatectomy with bilateral pelvic lymph node dissection for a T1c prostate adenocarcinoma. Final pathology from this surgery showed prostatic adenocarcinoma , gleasons score 3+4=7, involving both lobes, no evidence of angiolymphatic invasion, extraprostatic extension or seminal  vesicle involvement. All resection margins are clear.  CT showed periaortic, left iliac, peri rectal LAD concerning for metastatic disease, and hence referred to Oncology.  He no showed to his first appointment given lack of transport, and came an hr late for his second appointment. He is here with his wife, he tells me that some LN were enlarged on a CT scan and his doctor wants to make sure he doesn't have cancer again. He denies any changes to his pain, has chronic back pain and pain in his legs for which he takes pain medication and FU with his PCP and pain management. PSA of 207, PET imaging with adenopathy in the abdomen and pelvis in  this patient with elevated PSA showing low level FDG uptake most suggestive of prostate metastases. Other disease processes that could have this distribution rectal cancer and lymphoma. The lack of pronounced FDG uptake in these lymph nodes and the reported PSA level make these less likely. Biopsy of RPLN confirmed prostate adenoca. He started taking casodex and had his first lupron on 10/18/2020 He started taking zytiga but was only taking one tab a day, despite instructions to take 4 a day. Although there is some new literature suggesting to take one tab if you eat a fatty meal, it is not reliable if he would end up eating a fatty meal every day, and also give concomitant use of phenytoin which is an inducer, in theory he may need more than 4 tabs a day. He is on Zytiga 1000 mg po daily with prednisone.   Prostate cancer metastatic to intraabdominal lymph node ([place])  10/02/2020 Initial Diagnosis   Prostate cancer metastatic to intraabdominal lymph node ([place])   10/02/2020 Cancer Staging   Staging form: Prostate, AJCC 8th Edition - Clinical: Stage IVB (cTX, cNX, pM1a, PSA: 207) - Signed by Joseph Pike, MD on 10/02/2020    Interim History  He is in a wheel chair today, He complains of leg pain again, hasnt seen ortho. Sometimes his leg just gives away. Besides leg pain, he denies any other bone pains. No change in urinary habits. No nausea, vomiting or diarrhea. Appetite is decent. Rest of the pertinent 10 point ROS reviewed and negative.  MEDICAL HISTORY:  Past Medical History:  Diagnosis Date  . Arthritis   . Cancer [facility])    prostate   . Frequency of urination   . Hypertension   . Seizures ([place])    after brain surg - none in past 20 yrs  . 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 BRAIN SURGERY     BLOOD CLOT REMOVED 1980  . MASS EXCISION  02/24/2012   Procedure: MINOR EXCISION OF MASS;  Surgeon:  Joseph Amass, MD;  Location: [facility] ORS;  Service: Urology;;  excision of neck lesion  . ROBOT ASSISTED LAPAROSCOPIC RADICAL PROSTATECTOMY  02/24/2012   Procedure: ROBOTIC ASSISTED LAPAROSCOPIC RADICAL PROSTATECTOMY;  Surgeon: Joseph Amass, MD;  Location: [facility]S;  Service: Urology;  Laterality: N/A;           SOCIAL HISTORY: Social History   Socioeconomic History  . Marital status: Married    Spouse name: Not on file  . Number of children: Not on file  . Years of education: Not on file  . Highest education level: Not on file  Occupational History  . Not on file  Tobacco Use  . Smoking status: Current  Every Day Smoker    Types: Cigarettes  . Smokeless tobacco: Never Used  . Tobacco comment: 2 cigarettes per day  Vaping Use  . Vaping Use: Never used  Substance and Sexual Activity  . Alcohol use: No  . Drug use: No  . Sexual activity: Not on file  Other Topics Concern  . Not on file  Social History Narrative  . Not on file   Social Determinants of Health   Financial Resource Strain: Not on file  Food Insecurity: Not on file  Transportation Needs: Not on file  Physical Activity: Not on file  Stress: Not on file  Social Connections: Not on file  Intimate Partner Violence: Not on file    FAMILY HISTORY: No family history on file.  ALLERGIES:  has No Known Allergies.  MEDICATIONS:  Current Outpatient Medications  Medication Sig Dispense Refill  . abiraterone acetate (ZYTIGA) 250 MG tablet TAKE 4 TABLETS (1,000 MG TOTAL) BY MOUTH DAILY. TAKE ON AN EMPTY STOMACH 1 HOUR BEFORE OR 2 HOURS AFTER A MEAL 120 tablet 0  . atenolol (TENORMIN) 25 MG tablet Take 25 mg by mouth daily.    . cilostazol (PLETAL) 50 MG tablet Take 50 mg by mouth daily.    . fenofibrate (TRICOR) 48 MG tablet Take 48 mg by mouth daily.    . hydrochlorothiazide (HYDRODIURIL) 25 MG tablet Take 25 mg by mouth daily.    Marland Kitchen HYDROcodone-acetaminophen (NORCO) 10-325 MG tablet Take 1 tablet by mouth every 4  (four) hours as needed for moderate pain.    Marland Kitchen latanoprost (XALATAN) 0.005 % ophthalmic solution Place 1 drop into both eyes every other day. At night    . meloxicam (MOBIC) 15 MG tablet Take 15 mg by mouth daily.    Marland Kitchen olmesartan (BENICAR) 40 MG tablet Take 40 mg by mouth daily.    . phenytoin (DILANTIN) 100 MG ER capsule Take 300 mg by mouth daily.    . predniSONE (DELTASONE) 5 MG tablet TAKE 1 TABLET BY MOUTH ONCE A DAY WITH BREAKFAST 30 tablet 3  . verapamil (VERELAN PM) 360 MG 24 hr capsule Take 360 mg by mouth daily.     No current facility-administered medications for this visit.    PHYSICAL EXAMINATION:  ECOG PERFORMANCE STATUS: 3 - Symptomatic, >50% confined to bed  Vitals:   01/29/21 1557  BP: 127/77  Pulse: 85  Resp: 18  Temp: 97.9 F (36.6 C)  SpO2: 100%   There were no vitals filed for this visit. Physical Exam Constitutional:      Appearance: Normal appearance.  HENT:     Head: Normocephalic and atraumatic.  Cardiovascular:     Rate and Rhythm: Normal rate and regular rhythm.     Pulses: Normal pulses.     Heart sounds: Normal heart sounds.  Pulmonary:     Effort: Pulmonary effort is normal.     Breath sounds: Normal breath sounds.  Abdominal:     General: Abdomen is flat. Bowel sounds are normal. There is no distension.     Palpations: Abdomen is soft. There is no mass.  Musculoskeletal:        General: No swelling or tenderness. Normal range of motion.     Cervical back: Normal range of motion and neck supple.     Right lower leg: No edema.     Left lower leg: No edema.     Comments: He is in a wheelchair today, says his right knee bothers him and it  gives out randomly.  Lymphadenopathy:     Cervical: No cervical adenopathy.  Skin:    General: Skin is warm and dry.  Neurological:     General: No focal deficit present.     Mental Status: He is alert.  Psychiatric:        Mood and Affect: Mood normal.        Behavior: Behavior normal.      LABORATORY DATA:  I have reviewed the data as listed Lab Results  Component Value Date   WBC 11.4 (H) 01/29/2021   HGB 10.0 (L) 01/29/2021   HCT 30.4 (L) 01/29/2021   MCV 90.2 01/29/2021   PLT 314 01/29/2021     Chemistry      Component Value Date/Time   NA 142 01/29/2021 1617   K 2.9 (L) 01/29/2021 1617   CL 103 01/29/2021 1617   CO2 26 01/29/2021 1617   BUN 39 (H) 01/29/2021 1617   CREATININE 2.63 (H) 01/29/2021 1617      Component Value Date/Time   CALCIUM 8.9 01/29/2021 1617   ALKPHOS 91 01/29/2021 1617   AST 13 (L) 01/29/2021 1617   ALT <6 01/29/2021 1617   BILITOT 0.3 01/29/2021 1617      RADIOGRAPHIC STUDIES: I have personally reviewed the radiological images as listed and agreed with the findings in the report. NM Bone Scan Whole Body  Result Date: 01/21/2021 CLINICAL DATA:  History of prostate cancer. PSA of 0.2 on 01/01/2021. Diffuse bone pain, worst about the right knee and right hip. History of recent fall. EXAM: NUCLEAR MEDICINE WHOLE BODY BONE SCAN TECHNIQUE: Whole body anterior and posterior images were obtained approximately 3 hours after intravenous injection of radiopharmaceutical. RADIOPHARMACEUTICALS:  20.5 mCi Technetium-28m MDP IV COMPARISON:  PET CT scan 09/03/2020. FINDINGS: Scattered areas of increased activity in the cervical, thoracic and lumbar spine to the right and left of midline is most consistent with facet degenerative disease and worst at L5-S1. Increased activity about both knees is also consistent with osteoarthritis. There is also some degenerative activity at the acromioclavicular joints and left first MTP joint. Soft tissue uptake is negative. IMPRESSION: Negative for metastatic disease. Multifocal degenerative change appears worst about the knees. Electronically Signed   By: Inge Rise M.D.   On: 01/21/2021 14:29    All questions were answered. The patient knows to call the clinic with any problems, questions or concerns.      Joseph Pike, MD 01/31/2021 12:36 PM

## 2021-01-30 LAB — PSA, TOTAL AND FREE
PSA, Free Pct: 30 %
PSA, Free: 0.03 ng/mL
Prostate Specific Ag, Serum: 0.1 ng/mL (ref 0.0–4.0)

## 2021-01-31 ENCOUNTER — Other Ambulatory Visit: Payer: Self-pay

## 2021-01-31 ENCOUNTER — Encounter: Payer: Self-pay | Admitting: Hematology and Oncology

## 2021-01-31 ENCOUNTER — Telehealth: Payer: Self-pay | Admitting: Hematology and Oncology

## 2021-01-31 ENCOUNTER — Telehealth: Payer: Self-pay

## 2021-01-31 DIAGNOSIS — C772 Secondary and unspecified malignant neoplasm of intra-abdominal lymph nodes: Secondary | ICD-10-CM

## 2021-01-31 DIAGNOSIS — M79604 Pain in right leg: Secondary | ICD-10-CM | POA: Insufficient documentation

## 2021-01-31 DIAGNOSIS — R7989 Other specified abnormal findings of blood chemistry: Secondary | ICD-10-CM

## 2021-01-31 DIAGNOSIS — N179 Acute kidney failure, unspecified: Secondary | ICD-10-CM | POA: Insufficient documentation

## 2021-01-31 NOTE — Assessment & Plan Note (Signed)
No concern for mets on bone scan. I have many times encouraged that he set up ortho appointment, knee pain could be referred from hip or back. He was given phone number last time to call ortho, encouraged to set this up.

## 2021-01-31 NOTE — Assessment & Plan Note (Signed)
Labs with AKI, unclear etiology. I tried to call Dr Cathey Endow office about this AKI, encouraged the patient to hydrate himself well, hold HCTZ, return to clinic with repeat labs on Monday I also ordered UA, US renal to further evaluate AKI I left my phone number for Dr Berdine Addison to call me back. Our nursing team will help assist schedule [place], labs and infusion for Monday.

## 2021-01-31 NOTE — Telephone Encounter (Signed)
Scheduled appt per 5/13 sch msg. Pt aware.  

## 2021-01-31 NOTE — Assessment & Plan Note (Signed)
This is a very pleasant 75 yr old male patient with PMH significant for prostate adenocarcinoma Gleason 3+4, no LN involvement s.p robotic prostatectomy in 2013, presented with peri aortic left iliac and peri rectal LN, with biopsy confirming metastatic prostate adenoca now on zytiga and lupron presents here for FU on his metastatic prostate cancer. Baseline PSA of 207.  He is here for FU on Zytiga/prednisone/lupron. Most recent bone scan with no concern for bone mets. We will continue current treatment, next lupron in July.

## 2021-01-31 NOTE — Telephone Encounter (Signed)
This nurse spoke with patient and informed him that Dr. Chryl Heck has reviewed his labs.  Creatinine level is elevated so he will need to come in on Monday 02/03/21 at 11:30 for Lab draw, followed by fluid administration, then he is to have a [place] of his Kidney.  This nurse informed patient that he will need to have a full bladder for this ultrasound.  Per MD orders patient is to stop taking Hydrodiuril until further notice. This nurse encouraged patient to increase his water intake through the weekend to help lower creatinine levels.  Patient acknowledged understanding.  No questions or concerns at this time.

## 2021-02-03 ENCOUNTER — Other Ambulatory Visit: Payer: Self-pay | Admitting: Hematology and Oncology

## 2021-02-03 ENCOUNTER — Ambulatory Visit (HOSPITAL_COMMUNITY)
Admission: RE | Admit: 2021-02-03 | Discharge: 2021-02-03 | Disposition: A | Discharge status: Home / Self Care | Payer: Medicare HMO | Source: Ambulatory Visit | Attending: Hematology and Oncology | Admitting: Hematology and Oncology

## 2021-02-03 ENCOUNTER — Inpatient Hospital Stay: Payer: Medicare HMO

## 2021-02-03 ENCOUNTER — Other Ambulatory Visit: Payer: Self-pay

## 2021-02-03 DIAGNOSIS — C772 Secondary and unspecified malignant neoplasm of intra-abdominal lymph nodes: Secondary | ICD-10-CM | POA: Insufficient documentation

## 2021-02-03 DIAGNOSIS — F17210 Nicotine dependence, cigarettes, uncomplicated: Secondary | ICD-10-CM | POA: Diagnosis not present

## 2021-02-03 DIAGNOSIS — N179 Acute kidney failure, unspecified: Secondary | ICD-10-CM | POA: Diagnosis not present

## 2021-02-03 DIAGNOSIS — C61 Malignant neoplasm of prostate: Secondary | ICD-10-CM | POA: Diagnosis not present

## 2021-02-03 DIAGNOSIS — Z8546 Personal history of malignant neoplasm of prostate: Secondary | ICD-10-CM

## 2021-02-03 DIAGNOSIS — R7989 Other specified abnormal findings of blood chemistry: Secondary | ICD-10-CM

## 2021-02-03 DIAGNOSIS — Z8669 Personal history of other diseases of the nervous system and sense organs: Secondary | ICD-10-CM | POA: Diagnosis not present

## 2021-02-03 DIAGNOSIS — M129 Arthropathy, unspecified: Secondary | ICD-10-CM | POA: Diagnosis not present

## 2021-02-03 DIAGNOSIS — C775 Secondary and unspecified malignant neoplasm of intrapelvic lymph nodes: Secondary | ICD-10-CM | POA: Diagnosis not present

## 2021-02-03 DIAGNOSIS — M79604 Pain in right leg: Secondary | ICD-10-CM | POA: Diagnosis not present

## 2021-02-03 DIAGNOSIS — R590 Localized enlarged lymph nodes: Secondary | ICD-10-CM

## 2021-02-03 DIAGNOSIS — N289 Disorder of kidney and ureter, unspecified: Secondary | ICD-10-CM | POA: Diagnosis not present

## 2021-02-03 LAB — CBC WITH DIFFERENTIAL/PLATELET
Abs Immature Granulocytes: 0.03 10*3/uL (ref 0.00–0.07)
Basophils Absolute: 0.1 10*3/uL (ref 0.0–0.1)
Basophils Relative: 1 %
Eosinophils Absolute: 0.2 10*3/uL (ref 0.0–0.5)
Eosinophils Relative: 2 %
HCT: 30 % — ABNORMAL LOW (ref 39.0–52.0)
Hemoglobin: 10.1 g/dL — ABNORMAL LOW (ref 13.0–17.0)
Immature Granulocytes: 0 %
Lymphocytes Relative: 17 %
Lymphs Abs: 1.6 10*3/uL (ref 0.7–4.0)
MCH: 30.1 pg (ref 26.0–34.0)
MCHC: 33.7 g/dL (ref 30.0–36.0)
MCV: 89.3 fL (ref 80.0–100.0)
Monocytes Absolute: 0.5 10*3/uL (ref 0.1–1.0)
Monocytes Relative: 5 %
Neutro Abs: 7.1 10*3/uL (ref 1.7–7.7)
Neutrophils Relative %: 75 %
Platelets: 310 10*3/uL (ref 150–400)
RBC: 3.36 MIL/uL — ABNORMAL LOW (ref 4.22–5.81)
RDW: 12.6 % (ref 11.5–15.5)
WBC: 9.5 10*3/uL (ref 4.0–10.5)
nRBC: 0 % (ref 0.0–0.2)

## 2021-02-03 LAB — CMP (CANCER CENTER ONLY)
ALT: <6 U/L (ref 0–44)
AST: 14 U/L — ABNORMAL LOW (ref 15–41)
Albumin: 3.9 g/dL (ref 3.5–5.0)
Alkaline Phosphatase: 92 U/L (ref 38–126)
Anion gap: 6 (ref 5–15)
BUN: 26 mg/dL — ABNORMAL HIGH (ref 8–23)
CO2: 31 mmol/L (ref 22–32)
Calcium: 9.3 mg/dL (ref 8.9–10.3)
Chloride: 100 mmol/L (ref 98–111)
Creatinine: 1.4 mg/dL — ABNORMAL HIGH (ref 0.61–1.24)
GFR, Estimated: 53 mL/min — ABNORMAL LOW (ref >60)
Glucose, Bld: 145 mg/dL — ABNORMAL HIGH (ref 70–99)
Potassium: 2.8 mmol/L — ABNORMAL LOW (ref 3.5–5.1)
Sodium: 137 mmol/L (ref 135–145)
Total Bilirubin: 0.4 mg/dL (ref 0.3–1.2)
Total Protein: 7.5 g/dL (ref 6.5–8.1)

## 2021-02-03 LAB — SODIUM, URINE, RANDOM: Sodium, Ur: 66 mmol/L

## 2021-02-03 MED ORDER — SODIUM CHLORIDE 0.9 % IV SOLN
Freq: Once | INTRAVENOUS | Status: AC
  Filled 2021-02-03: qty 250

## 2021-02-03 MED ORDER — POTASSIUM CHLORIDE 20 MEQ PO PACK: 20.0000 meq | PACK | Freq: Two times a day (BID) | ORAL | Status: DC

## 2021-02-03 NOTE — Patient Instructions (Signed)
Dehydration, Adult Dehydration is condition in which there is not enough water or other fluids in the body. This happens when a person loses more fluids than he or she takes in. Important body parts cannot work right without the right amount of fluids. Any loss of fluids from the body can cause dehydration. Dehydration can be mild, worse, or very bad. It should be treated right away to keep it from getting very bad. What are the causes? This condition may be caused by:  Conditions that cause loss of water or other fluids, such as: ? Watery poop (diarrhea). ? Vomiting. ? Sweating a lot. ? Peeing (urinating) a lot.  Not drinking enough fluids, especially when you: ? Are ill. ? Are doing things that take a lot of energy to do.  Other illnesses and conditions, such as fever or infection.  Certain medicines, such as medicines that take extra fluid out of the body (diuretics).  Lack of safe drinking water.  Not being able to get enough water and food. What increases the risk? The following factors may make you more likely to develop this condition:  Having a long-term (chronic) illness that has not been treated the right way, such as: ? Diabetes. ? Heart disease. ? Kidney disease.  Being 65 years of age or older.  Having a disability.  Living in a place that is high above the ground or sea (high in altitude). The thinner, dried air causes more fluid loss.  Doing exercises that put stress on your body for a long time. What are the signs or symptoms? Symptoms of dehydration depend on how bad it is. Mild or worse dehydration  Thirst.  Dry lips or dry mouth.  Feeling dizzy or light-headed, especially when you stand up from sitting.  Muscle cramps.  Your body making: ? Dark pee (urine). Pee may be the color of tea. ? Less pee than normal. ? Less tears than normal.  Headache. Very bad dehydration  Changes in skin. Skin may: ? Be cold to the touch (clammy). ? Be blotchy  or pale. ? Not go back to normal right after you lightly pinch it and let it go.  Little or no tears, pee, or sweat.  Changes in vital signs, such as: ? Fast breathing. ? Low blood pressure. ? Weak pulse. ? Pulse that is more than 100 beats a minute when you are sitting still.  Other changes, such as: ? Feeling very thirsty. ? Eyes that look hollow (sunken). ? Cold hands and feet. ? Being mixed up (confused). ? Being very tired (lethargic) or having trouble waking from sleep. ? Short-term weight loss. ? Loss of consciousness. How is this treated? Treatment for this condition depends on how bad it is. Treatment should start right away. Do not wait until your condition gets very bad. Very bad dehydration is an emergency. You will need to go to a hospital.  Mild or worse dehydration can be treated at home. You may be asked to: ? Drink more fluids. ? Drink an oral rehydration solution (ORS). This drink helps get the right amounts of fluids and salts and minerals in the blood (electrolytes).  Very bad dehydration can be treated: ? With fluids through an IV tube. ? By getting normal levels of salts and minerals in your blood. This is often done by giving salts and minerals through a tube. The tube is passed through your nose and into your stomach. ? By treating the root cause. Follow these instructions at   home: Oral rehydration solution If told by your doctor, drink an ORS:  Make an ORS. Use instructions on the package.  Start by drinking small amounts, about  cup (120 mL) every 5-10 minutes.  Slowly drink more until you have had the amount that your doctor said to have. Eating and drinking  Drink enough clear fluid to keep your pee pale yellow. If you were told to drink an ORS, finish the ORS first. Then, start slowly drinking other clear fluids. Drink fluids such as: ? Water. Do not drink only water. Doing that can make the salt (sodium) level in your body get too low. ? Water  from ice chips you suck on. ? Fruit juice that you have added water to (diluted). ? Low-calorie sports drinks.  Eat foods that have the right amounts of salts and minerals, such as: ? Bananas. ? Oranges. ? Potatoes. ? Tomatoes. ? Spinach.  Do not drink alcohol.  Avoid: ? Drinks that have a lot of sugar. These include:  High-calorie sports drinks.  Fruit juice that you did not add water to.  Soda.  Caffeine. ? Foods that are greasy or have a lot of fat or sugar.         General instructions  Take over-the-counter and prescription medicines only as told by your doctor.  Do not take salt tablets. Doing that can make the salt level in your body get too high.  Return to your normal activities as told by your doctor. Ask your doctor what activities are safe for you.  Keep all follow-up visits as told by your doctor. This is important. Contact a doctor if:  You have pain in your belly (abdomen) and the pain: ? Gets worse. ? Stays in one place.  You have a rash.  You have a stiff neck.  You get angry or annoyed (irritable) more easily than normal.  You are more tired or have a harder time waking than normal.  You feel: ? Weak or dizzy. ? Very thirsty. Get help right away if you have:  Any symptoms of very bad dehydration.  Symptoms of vomiting, such as: ? You cannot eat or drink without vomiting. ? Your vomiting gets worse or does not go away. ? Your vomit has blood or green stuff in it.  Symptoms that get worse with treatment.  A fever.  A very bad headache.  Problems with peeing or pooping (having a bowel movement), such as: ? Watery poop that gets worse or does not go away. ? Blood in your poop (stool). This may cause poop to look black and tarry. ? Not peeing in 6-8 hours. ? Peeing only a small amount of very dark pee in 6-8 hours.  Trouble breathing. These symptoms may be an emergency. Do not wait to see if the symptoms will go away. Get  medical help right away. Call your local emergency services (911 in the [place] Do not drive yourself to the hospital. Summary  Dehydration is a condition in which there is not enough water or other fluids in the body. This happens when a person loses more fluids than he or she takes in.  Treatment for this condition depends on how bad it is. Treatment should be started right away. Do not wait until your condition gets very bad.  Drink enough clear fluid to keep your pee pale yellow. If you were told to drink an oral rehydration solution (ORS), finish the ORS first. Then, start slowly drinking other clear fluids.    Take over-the-counter and prescription medicines only as told by your doctor.  Get help right away if you have any symptoms of very bad dehydration. This information is not intended to replace advice given to you by your health care provider. Make sure you discuss any questions you have with your health care provider. Document Revised: 04/20/2019 Document Reviewed: 04/20/2019 Elsevier Patient Education  2021 Elsevier Inc.  

## 2021-02-04 LAB — PSA, TOTAL AND FREE
PSA, Free Pct: >20 %
PSA, Free: 0.02 ng/mL
Prostate Specific Ag, Serum: <0.1 ng/mL (ref 0.0–4.0)

## 2021-02-05 ENCOUNTER — Telehealth: Payer: Self-pay

## 2021-02-05 ENCOUNTER — Telehealth: Payer: Self-pay | Admitting: Hematology and Oncology

## 2021-02-05 NOTE — Telephone Encounter (Signed)
Spoke to patient regarding recent lab values. Per Dr. Chryl Heck, patient is to begin taking potassium 20 mEq BID for 7 days. Patient notified that the prescription had already be sent to his pharmacy and he can pick it up at his convenience. Patient also advised to start back on a 1/2 tablet of HCTZ again. Patient is asked to call and follow-up with Dr. Berdine Addison within the next week, if possible.  Patient verbalized an understanding of this information and recommendations.  Patient also provided with the office number of their orthopedic doctor, Dr.Varkey. Patient has already been referred and can call to make an appointment. Confirmed phone number with patient.   Patient had no further questions or concerns.

## 2021-02-05 NOTE — Telephone Encounter (Signed)
-----   Message from Benay Pike, MD sent at 02/03/2021  2:25 PM EDT ----- Kidney numbers are better, potassium is still low. I will send him a prescription for potassium. Please ask him to see Dr Berdine Addison this week, I already called Dr Berdine Addison He can restart  He can start taking Potassium 20 meq BID for 7 days. He can start back on 1/2 tab of HCTZ again if he stopped it.  Thanks,

## 2021-02-05 NOTE — Telephone Encounter (Signed)
R/s per provider eq, pt aware

## 2021-02-07 DIAGNOSIS — F17210 Nicotine dependence, cigarettes, uncomplicated: Secondary | ICD-10-CM | POA: Diagnosis not present

## 2021-02-07 DIAGNOSIS — Z9181 History of falling: Secondary | ICD-10-CM | POA: Diagnosis not present

## 2021-02-07 DIAGNOSIS — M21372 Foot drop, left foot: Secondary | ICD-10-CM | POA: Diagnosis not present

## 2021-02-07 DIAGNOSIS — Z6825 Body mass index (BMI) 25.0-25.9, adult: Secondary | ICD-10-CM | POA: Diagnosis not present

## 2021-02-07 DIAGNOSIS — C61 Malignant neoplasm of prostate: Secondary | ICD-10-CM | POA: Diagnosis not present

## 2021-02-07 DIAGNOSIS — Z79899 Other long term (current) drug therapy: Secondary | ICD-10-CM | POA: Diagnosis not present

## 2021-02-07 DIAGNOSIS — M1712 Unilateral primary osteoarthritis, left knee: Secondary | ICD-10-CM | POA: Diagnosis not present

## 2021-02-07 DIAGNOSIS — I10 Essential (primary) hypertension: Secondary | ICD-10-CM | POA: Diagnosis not present

## 2021-02-07 DIAGNOSIS — M5136 Other intervertebral disc degeneration, lumbar region: Secondary | ICD-10-CM | POA: Diagnosis not present

## 2021-02-08 ENCOUNTER — Other Ambulatory Visit (HOSPITAL_COMMUNITY): Payer: Self-pay

## 2021-02-09 ENCOUNTER — Other Ambulatory Visit (HOSPITAL_COMMUNITY): Payer: Self-pay

## 2021-02-10 ENCOUNTER — Other Ambulatory Visit (HOSPITAL_COMMUNITY): Payer: Self-pay

## 2021-02-11 ENCOUNTER — Other Ambulatory Visit (HOSPITAL_COMMUNITY): Payer: Self-pay

## 2021-02-12 ENCOUNTER — Other Ambulatory Visit (HOSPITAL_COMMUNITY): Payer: Self-pay

## 2021-02-14 ENCOUNTER — Other Ambulatory Visit (HOSPITAL_COMMUNITY): Payer: Self-pay

## 2021-02-19 ENCOUNTER — Other Ambulatory Visit (HOSPITAL_COMMUNITY): Payer: Self-pay

## 2021-02-20 ENCOUNTER — Other Ambulatory Visit (HOSPITAL_COMMUNITY): Payer: Self-pay

## 2021-02-21 ENCOUNTER — Other Ambulatory Visit: Payer: Self-pay | Admitting: Hematology and Oncology

## 2021-02-21 ENCOUNTER — Other Ambulatory Visit (HOSPITAL_COMMUNITY): Payer: Self-pay

## 2021-02-21 MED ORDER — ABIRATERONE ACETATE 250 MG PO TABS
1000.0000 mg | ORAL_TABLET | Freq: Every day | ORAL | 0 refills | Status: DC
  Filled 2021-02-24: qty 120, 30d supply, fill #0

## 2021-02-24 ENCOUNTER — Other Ambulatory Visit (HOSPITAL_COMMUNITY): Payer: Self-pay

## 2021-02-26 ENCOUNTER — Ambulatory Visit: Payer: Medicare HMO | Admitting: Hematology and Oncology

## 2021-02-26 ENCOUNTER — Other Ambulatory Visit: Payer: Medicare HMO

## 2021-02-27 ENCOUNTER — Other Ambulatory Visit (HOSPITAL_COMMUNITY): Payer: Self-pay

## 2021-02-27 ENCOUNTER — Telehealth: Payer: Self-pay

## 2021-02-27 ENCOUNTER — Inpatient Hospital Stay: Payer: Medicare HMO | Attending: Hematology and Oncology

## 2021-02-27 ENCOUNTER — Telehealth: Payer: Self-pay | Admitting: Hematology and Oncology

## 2021-02-27 ENCOUNTER — Inpatient Hospital Stay: Payer: Medicare HMO | Admitting: Hematology and Oncology

## 2021-02-27 DIAGNOSIS — Z8669 Personal history of other diseases of the nervous system and sense organs: Secondary | ICD-10-CM | POA: Insufficient documentation

## 2021-02-27 DIAGNOSIS — C772 Secondary and unspecified malignant neoplasm of intra-abdominal lymph nodes: Secondary | ICD-10-CM | POA: Insufficient documentation

## 2021-02-27 DIAGNOSIS — I10 Essential (primary) hypertension: Secondary | ICD-10-CM | POA: Insufficient documentation

## 2021-02-27 DIAGNOSIS — E876 Hypokalemia: Secondary | ICD-10-CM | POA: Insufficient documentation

## 2021-02-27 DIAGNOSIS — Z79899 Other long term (current) drug therapy: Secondary | ICD-10-CM | POA: Insufficient documentation

## 2021-02-27 DIAGNOSIS — F17210 Nicotine dependence, cigarettes, uncomplicated: Secondary | ICD-10-CM | POA: Insufficient documentation

## 2021-02-27 DIAGNOSIS — C61 Malignant neoplasm of prostate: Secondary | ICD-10-CM | POA: Insufficient documentation

## 2021-02-27 DIAGNOSIS — Z791 Long term (current) use of non-steroidal anti-inflammatories (NSAID): Secondary | ICD-10-CM | POA: Insufficient documentation

## 2021-02-27 NOTE — Telephone Encounter (Signed)
R/s per 6/9 sch msg, pt aware

## 2021-02-27 NOTE — Telephone Encounter (Signed)
Attempted to call patient regarding missed lab/MD appt on 6/9. No answer, unable to leave voicemail. Scheduling message sent to reschedule appts.

## 2021-03-06 ENCOUNTER — Other Ambulatory Visit: Payer: Self-pay

## 2021-03-06 ENCOUNTER — Telehealth: Payer: Self-pay

## 2021-03-06 ENCOUNTER — Inpatient Hospital Stay: Payer: Medicare HMO

## 2021-03-06 ENCOUNTER — Inpatient Hospital Stay (HOSPITAL_BASED_OUTPATIENT_CLINIC_OR_DEPARTMENT_OTHER): Payer: Medicare HMO | Admitting: Hematology and Oncology

## 2021-03-06 DIAGNOSIS — C772 Secondary and unspecified malignant neoplasm of intra-abdominal lymph nodes: Secondary | ICD-10-CM

## 2021-03-06 DIAGNOSIS — Z79899 Other long term (current) drug therapy: Secondary | ICD-10-CM | POA: Diagnosis not present

## 2021-03-06 DIAGNOSIS — C61 Malignant neoplasm of prostate: Secondary | ICD-10-CM

## 2021-03-06 DIAGNOSIS — Z8669 Personal history of other diseases of the nervous system and sense organs: Secondary | ICD-10-CM | POA: Diagnosis not present

## 2021-03-06 DIAGNOSIS — N179 Acute kidney failure, unspecified: Secondary | ICD-10-CM

## 2021-03-06 DIAGNOSIS — E876 Hypokalemia: Secondary | ICD-10-CM | POA: Diagnosis not present

## 2021-03-06 DIAGNOSIS — M79604 Pain in right leg: Secondary | ICD-10-CM | POA: Diagnosis not present

## 2021-03-06 DIAGNOSIS — Z791 Long term (current) use of non-steroidal anti-inflammatories (NSAID): Secondary | ICD-10-CM | POA: Diagnosis not present

## 2021-03-06 DIAGNOSIS — I10 Essential (primary) hypertension: Secondary | ICD-10-CM | POA: Diagnosis not present

## 2021-03-06 DIAGNOSIS — F17210 Nicotine dependence, cigarettes, uncomplicated: Secondary | ICD-10-CM | POA: Diagnosis not present

## 2021-03-06 LAB — CBC WITH DIFFERENTIAL/PLATELET
Abs Immature Granulocytes: 0.04 10*3/uL (ref 0.00–0.07)
Basophils Absolute: 0.1 10*3/uL (ref 0.0–0.1)
Basophils Relative: 1 %
Eosinophils Absolute: 0.1 10*3/uL (ref 0.0–0.5)
Eosinophils Relative: 1 %
HCT: 28.9 % — ABNORMAL LOW (ref 39.0–52.0)
Hemoglobin: 9.9 g/dL — ABNORMAL LOW (ref 13.0–17.0)
Immature Granulocytes: 0 %
Lymphocytes Relative: 19 %
Lymphs Abs: 2 10*3/uL (ref 0.7–4.0)
MCH: 30.5 pg (ref 26.0–34.0)
MCHC: 34.3 g/dL (ref 30.0–36.0)
MCV: 88.9 fL (ref 80.0–100.0)
Monocytes Absolute: 0.7 10*3/uL (ref 0.1–1.0)
Monocytes Relative: 6 %
Neutro Abs: 7.8 10*3/uL — ABNORMAL HIGH (ref 1.7–7.7)
Neutrophils Relative %: 73 %
Platelets: 287 10*3/uL (ref 150–400)
RBC: 3.25 MIL/uL — ABNORMAL LOW (ref 4.22–5.81)
RDW: 12.8 % (ref 11.5–15.5)
WBC: 10.7 10*3/uL — ABNORMAL HIGH (ref 4.0–10.5)
nRBC: 0 % (ref 0.0–0.2)

## 2021-03-06 LAB — BASIC METABOLIC PANEL - CANCER CENTER ONLY
Anion gap: 13 (ref 5–15)
BUN: 13 mg/dL (ref 8–23)
CO2: 30 mmol/L (ref 22–32)
Calcium: 9.3 mg/dL (ref 8.9–10.3)
Chloride: 99 mmol/L (ref 98–111)
Creatinine: 1.07 mg/dL (ref 0.61–1.24)
GFR, Estimated: >60 mL/min (ref >60)
Glucose, Bld: 120 mg/dL — ABNORMAL HIGH (ref 70–99)
Potassium: 2.6 mmol/L — CL (ref 3.5–5.1)
Sodium: 142 mmol/L (ref 135–145)

## 2021-03-06 MED ORDER — POTASSIUM CHLORIDE CRYS ER 20 MEQ PO TBCR: EXTENDED_RELEASE_TABLET | ORAL | 0 refills | Status: DC

## 2021-03-06 NOTE — Progress Notes (Signed)
CRITICAL VALUE STICKER  CRITICAL VALUE: Potassium - 2.6   RECEIVER (on-site recipient of call): Patty Sermons, RN  DATE & TIME NOTIFIED: 03/06/2021 @ 1617  MESSENGER (representative from lab): Rosann Auerbach  MD NOTIFIED: Dr. Chryl Heck  TIME OF NOTIFICATION: 03/06/2021 @ 1620  RESPONSE: Received verbal orders for patient to be called in prescription for potassium chloride. Patient to take 40 mEq for the next five days. After five days, patient is to continue on 20 mEq every day.

## 2021-03-06 NOTE — Telephone Encounter (Signed)
Attempted to contact patient regarding abnormal potassium levels and potassium chloride prescription. Phone number on file is unavailable at this time.  Will attempt to contact patient again at a later time.

## 2021-03-06 NOTE — Progress Notes (Signed)
[name]: Iona Beard, MD as PCP - General (Family Medicine)  CHIEF COMPLAINTS/PURPOSE OF CONSULTATION:  Abnormal imaging.  ASSESSMENT & PLAN:   No problem-specific Assessment & Plan notes found for this encounter.  No orders of the defined types were placed in this encounter.   HISTORY OF PRESENTING ILLNESS:   Oncology History Overview Note  Joseph Rangel 75 y.o. male is here because of some abnormal lymphadenopathy identified on CT angiogram especially given his history of prostate cancer.  He had robotic-assisted laparoscopic radical retropubic prostatectomy with bilateral pelvic lymph node dissection for a T1c prostate adenocarcinoma. Final pathology from this surgery showed prostatic adenocarcinoma , gleasons score 3+4=7, involving both lobes, no evidence of angiolymphatic invasion, extraprostatic extension or seminal vesicle involvement. All resection margins are clear.  CT showed periaortic, left iliac, peri rectal LAD concerning for metastatic disease, and hence referred to Oncology.  He no showed to his first appointment given lack of transport, and came an hr late for his second appointment. He is here with his wife, he tells me that some LN were enlarged on a CT scan and his doctor wants to make sure he doesn't have cancer again. He denies any changes to his pain, has chronic back pain and pain in his legs for which he takes pain medication and FU with his PCP and pain management. PSA of 207, PET imaging with adenopathy in the abdomen and pelvis in this patient with elevated PSA showing low level FDG uptake most suggestive of prostate metastases. Other disease processes that could have this distribution rectal cancer and lymphoma. The lack of pronounced FDG uptake in these lymph nodes and the reported PSA level make these less likely. Biopsy of RPLN confirmed prostate adenoca. He started taking casodex and had his first lupron  on 10/18/2020 He started taking zytiga but was only taking one tab a day, despite instructions to take 4 a day. Although there is some new literature suggesting to take one tab if you eat a fatty meal, it is not reliable if he would end up eating a fatty meal every day, and also give concomitant use of phenytoin which is an inducer, in theory he may need more than 4 tabs a day. He is on Zytiga 1000 mg po daily with prednisone.   Prostate cancer metastatic to intraabdominal lymph node ([name])  10/02/2020 Initial Diagnosis   Prostate cancer metastatic to intraabdominal lymph node ([name])    10/02/2020 Cancer Staging   Staging form: Prostate, AJCC 8th Edition - Clinical: Stage IVB (cTX, cNX, pM1a, PSA: 207) - Signed by Benay Pike, MD on 10/02/2020     Interim History  He is in a wheel chair today with his wife Again, pressing issue is right leg pain. He was instructed several times, and given Dr Rich Fuchs phone number several times for orthopedic consultation. He fails to make the appointment. Besides leg pain, he says he feels ok He is taking zytiga and prednisone, about to run out of prednisone No nausea, vomiting and diarrhea Appetite is good, weight is the same. No other bone pains No urinary complaints.  MEDICAL HISTORY:  Past Medical History:  Diagnosis Date   Arthritis    Cancer [facility])    prostate    Frequency of urination    Hypertension    Seizures ([place])    after brain surg - none in past 20 yrs   Speech disorder    due to brain surg  SURGICAL HISTORY: Past Surgical History:  Procedure Laterality Date   BRAIN SURGERY     BLOOD CLOT REMOVED 1980   MASS EXCISION  02/24/2012   Procedure: MINOR EXCISION OF MASS;  Surgeon: Bernestine Amass, MD;  Location: [facility];  Service: Urology;;  excision of neck lesion   ROBOT ASSISTED LAPAROSCOPIC RADICAL PROSTATECTOMY  02/24/2012   Procedure: ROBOTIC ASSISTED LAPAROSCOPIC RADICAL PROSTATECTOMY;  Surgeon: Bernestine Amass, MD;  Location:  [facility];  Service: Urology;  Laterality: N/A;           SOCIAL HISTORY: Social History   Socioeconomic History   Marital status: Married    Spouse name: Not on file   Number of children: Not on file   Years of education: Not on file   Highest education level: Not on file  Occupational History   Not on file  Tobacco Use   Smoking status: Every Day    Pack years: 0.00    Types: Cigarettes   Smokeless tobacco: Never   Tobacco comments:    2 cigarettes per day  Vaping Use   Vaping Use: Never used  Substance and Sexual Activity   Alcohol use: No   Drug use: No   Sexual activity: Not on file  Other Topics Concern   Not on file  Social History Narrative   Not on file   Social Determinants of Health   Financial Resource Strain: Not on file  Food Insecurity: Not on file  Transportation Needs: Not on file  Physical Activity: Not on file  Stress: Not on file  Social Connections: Not on file  Intimate Partner Violence: Not on file    FAMILY HISTORY: No family history on file.  ALLERGIES:  has No Known Allergies.  MEDICATIONS:  Current Outpatient Medications  Medication Sig Dispense Refill   abiraterone acetate (ZYTIGA) 250 MG tablet TAKE 4 TABLETS (1,000 MG TOTAL) BY MOUTH DAILY. TAKE ON AN EMPTY STOMACH 1 HOUR BEFORE OR 2 HOURS AFTER A MEAL 120 tablet 0   atenolol (TENORMIN) 25 MG tablet Take 25 mg by mouth daily.     cilostazol (PLETAL) 50 MG tablet Take 50 mg by mouth daily.     fenofibrate (TRICOR) 48 MG tablet Take 48 mg by mouth daily.     hydrochlorothiazide (HYDRODIURIL) 25 MG tablet Take 25 mg by mouth daily.     HYDROcodone-acetaminophen (NORCO) 10-325 MG tablet Take 1 tablet by mouth every 4 (four) hours as needed for moderate pain.     latanoprost (XALATAN) 0.005 % ophthalmic solution Place 1 drop into both eyes every other day. At night     meloxicam (MOBIC) 15 MG tablet Take 15 mg by mouth daily.     olmesartan (BENICAR) 40 MG tablet Take 40 mg by  mouth daily.     phenytoin (DILANTIN) 100 MG ER capsule Take 300 mg by mouth daily.     potassium chloride SA (KLOR-CON) 20 MEQ tablet Take 2 tablets (40 mEq total) by mouth daily for 5 days, THEN 1 tablet (20 mEq total) daily. 102 tablet 0   predniSONE (DELTASONE) 5 MG tablet TAKE 1 TABLET BY MOUTH ONCE A DAY WITH BREAKFAST 30 tablet 3   verapamil (VERELAN PM) 360 MG 24 hr capsule Take 360 mg by mouth daily.     No current facility-administered medications for this visit.    PHYSICAL EXAMINATION:  ECOG PERFORMANCE STATUS: 3 - Symptomatic, >50% confined to bed  Vitals:   03/06/21 1503  BP: 133/81  Pulse: 85  Resp: 18  Temp: 98.9 F (37.2 C)  SpO2: 100%   There were no vitals filed for this visit. Physical Exam Constitutional:      Appearance: Normal appearance.  HENT:     Head: Normocephalic and atraumatic.  Cardiovascular:     Rate and Rhythm: Normal rate and regular rhythm.     Pulses: Normal pulses.     Heart sounds: Normal heart sounds.  Pulmonary:     Effort: Pulmonary effort is normal.     Breath sounds: Normal breath sounds.  Abdominal:     General: Abdomen is flat. Bowel sounds are normal. There is no distension.     Palpations: Abdomen is soft. There is no mass.  Musculoskeletal:        General: No swelling (No swelling of RLE) or tenderness. Normal range of motion.     Cervical back: Normal range of motion and neck supple.     Right lower leg: No edema.     Left lower leg: No edema.     Comments: He is in a wheelchair today, says his right leg bothers him and it gives out randomly.  Lymphadenopathy:     Cervical: No cervical adenopathy.  Skin:    General: Skin is warm and dry.  Neurological:     General: No focal deficit present.     Mental Status: He is alert.  Psychiatric:        Mood and Affect: Mood normal.        Behavior: Behavior normal.    LABORATORY DATA:  I have reviewed the data as listed Lab Results  Component Value Date   WBC 10.7 (H)  03/06/2021   HGB 9.9 (L) 03/06/2021   HCT 28.9 (L) 03/06/2021   MCV 88.9 03/06/2021   PLT 287 03/06/2021     Chemistry      Component Value Date/Time   NA 142 03/06/2021 1446   K 2.6 (LL) 03/06/2021 1446   CL 99 03/06/2021 1446   CO2 30 03/06/2021 1446   BUN 13 03/06/2021 1446   CREATININE 1.07 03/06/2021 1446      Component Value Date/Time   CALCIUM 9.3 03/06/2021 1446   ALKPHOS 92 02/03/2021 1141   AST 14 (L) 02/03/2021 1141   ALT <6 02/03/2021 1141   BILITOT 0.4 02/03/2021 1141     Labs reviewed no concern for disease progression Again severe hypokalemia, likely from zytiga.  RADIOGRAPHIC STUDIES: I have personally reviewed the radiological images as listed and agreed with the findings in the report. No results found.   All questions were answered. The patient knows to call the clinic with any problems, questions or concerns.     Benay Pike, MD 03/07/2021 10:09 AM

## 2021-03-07 ENCOUNTER — Other Ambulatory Visit (HOSPITAL_COMMUNITY): Payer: Self-pay | Admitting: *Deleted

## 2021-03-07 ENCOUNTER — Encounter: Payer: Self-pay | Admitting: Hematology and Oncology

## 2021-03-07 ENCOUNTER — Telehealth: Payer: Self-pay

## 2021-03-07 DIAGNOSIS — E876 Hypokalemia: Secondary | ICD-10-CM | POA: Insufficient documentation

## 2021-03-07 LAB — PSA, TOTAL AND FREE
PSA, Free Pct: >20 %
PSA, Free: 0.02 ng/mL
Prostate Specific Ag, Serum: <0.1 ng/mL (ref 0.0–4.0)

## 2021-03-07 MED ORDER — POTASSIUM CHLORIDE CRYS ER 20 MEQ PO TBCR: EXTENDED_RELEASE_TABLET | ORAL | 0 refills | Status: DC

## 2021-03-07 NOTE — Telephone Encounter (Signed)
Spoke with Mr. Joseph Rangel regarding his recent potassium levels. Patient verbalized an understanding.  Informed patient that Dr. Chryl Heck wanted him to begin taking potassium supplementation and that a prescription had been sent to this pharmacy. Mr. Joseph Rangel confirmed that this was the correct pharmacy and verbalized an understanding of how he was to begin taking the potassium.  Also, confirmed with patient about urine specimen. Patient stated he would try to bring in a sample to the [facility], if possible, to be analyzed.  Patient had no additional questions or concerns upon conclusion of phone call.

## 2021-03-07 NOTE — Assessment & Plan Note (Signed)
Unlikely this is related to metastatic prostate cancer since his PSA is well and his bone scan is neg. Again, encouraged ortho appointment, if no clear orthopedic findings, then can benefit from vascular surgery consultation. No leg swelling, no major concern for DVT

## 2021-03-07 NOTE — Assessment & Plan Note (Signed)
This is a very pleasant 75 yr old male patient with PMH significant for prostate adenocarcinoma Gleason 3+4, no LN involvement s.p robotic prostatectomy in 2013, presented with peri aortic left iliac and peri rectal LN, with biopsy confirming metastatic prostate adenoca now on zytiga and lupron presents here for FU on his metastatic prostate cancer. Baseline PSA of 207.  He is here for FU on Zytiga/prednisone/lupron. No concern for disease progression.  I wonder if the right leg pain is referred pain from his back vs vascular disease. He was again given Dr Rich Fuchs number and asked to contact for ortho consultation.

## 2021-03-07 NOTE — Assessment & Plan Note (Addendum)
Hypokalemia from zytiga He will start Potassium supplement 40 meq for 5 days followed by 20 meq daily. Encouraged potassium rich diet We will continue to monitor.

## 2021-03-10 DIAGNOSIS — I10 Essential (primary) hypertension: Secondary | ICD-10-CM | POA: Diagnosis not present

## 2021-03-10 DIAGNOSIS — Z9181 History of falling: Secondary | ICD-10-CM | POA: Diagnosis not present

## 2021-03-10 DIAGNOSIS — M1712 Unilateral primary osteoarthritis, left knee: Secondary | ICD-10-CM | POA: Diagnosis not present

## 2021-03-10 DIAGNOSIS — Z6825 Body mass index (BMI) 25.0-25.9, adult: Secondary | ICD-10-CM | POA: Diagnosis not present

## 2021-03-10 DIAGNOSIS — M5136 Other intervertebral disc degeneration, lumbar region: Secondary | ICD-10-CM | POA: Diagnosis not present

## 2021-03-10 DIAGNOSIS — F17210 Nicotine dependence, cigarettes, uncomplicated: Secondary | ICD-10-CM | POA: Diagnosis not present

## 2021-03-10 DIAGNOSIS — C61 Malignant neoplasm of prostate: Secondary | ICD-10-CM | POA: Diagnosis not present

## 2021-03-10 DIAGNOSIS — M21372 Foot drop, left foot: Secondary | ICD-10-CM | POA: Diagnosis not present

## 2021-03-17 ENCOUNTER — Other Ambulatory Visit (HOSPITAL_COMMUNITY): Payer: Self-pay

## 2021-03-20 IMAGING — DX DG KNEE COMPLETE 4+V*R*
5 series · 5 of 5 positions shown · non-contrast
Comparison: None.

CLINICAL DATA: Right knee pain for 5 days.

EXAM:
RIGHT KNEE - COMPLETE 4+ VIEW

[knee ap]
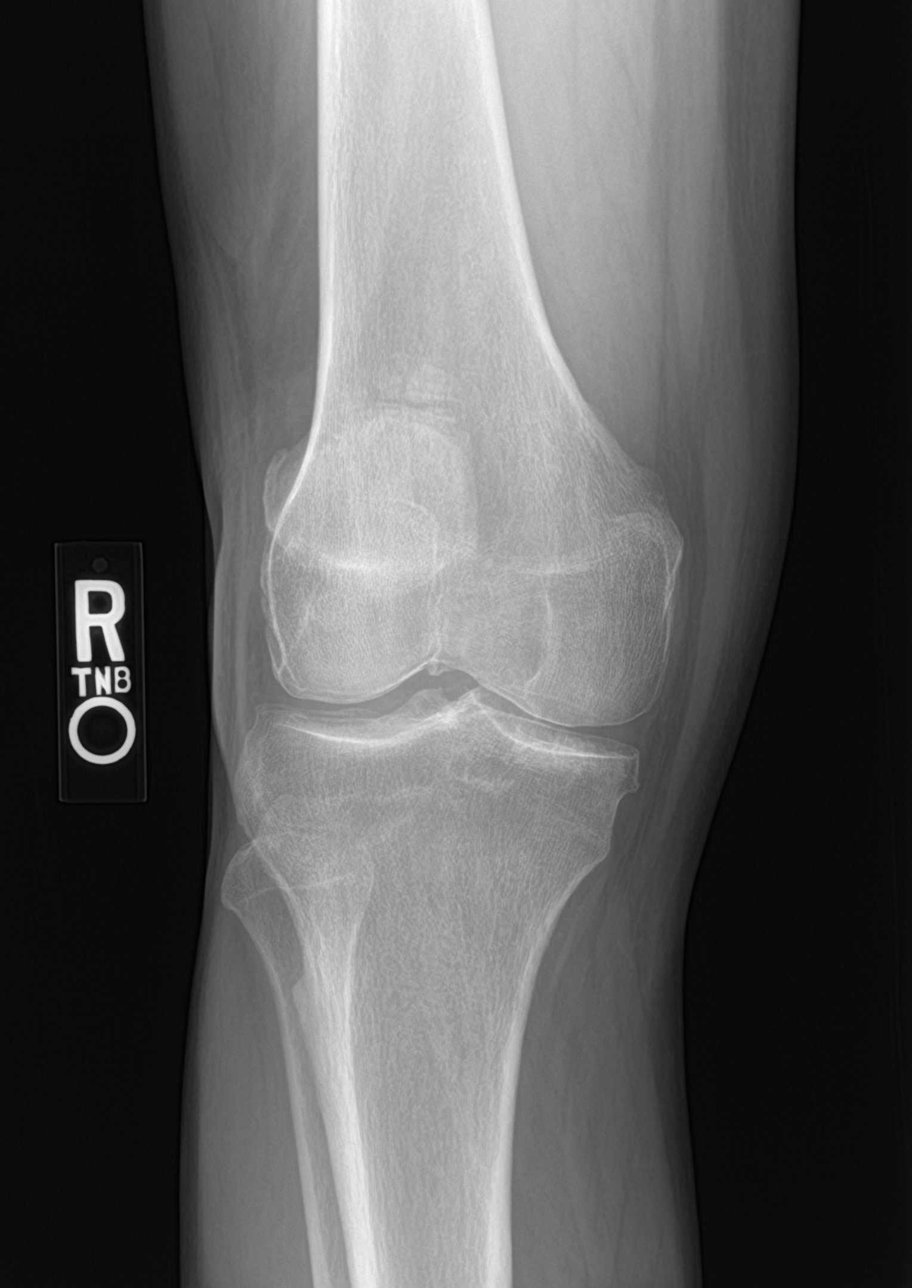

[knee obl (1 of 2)]
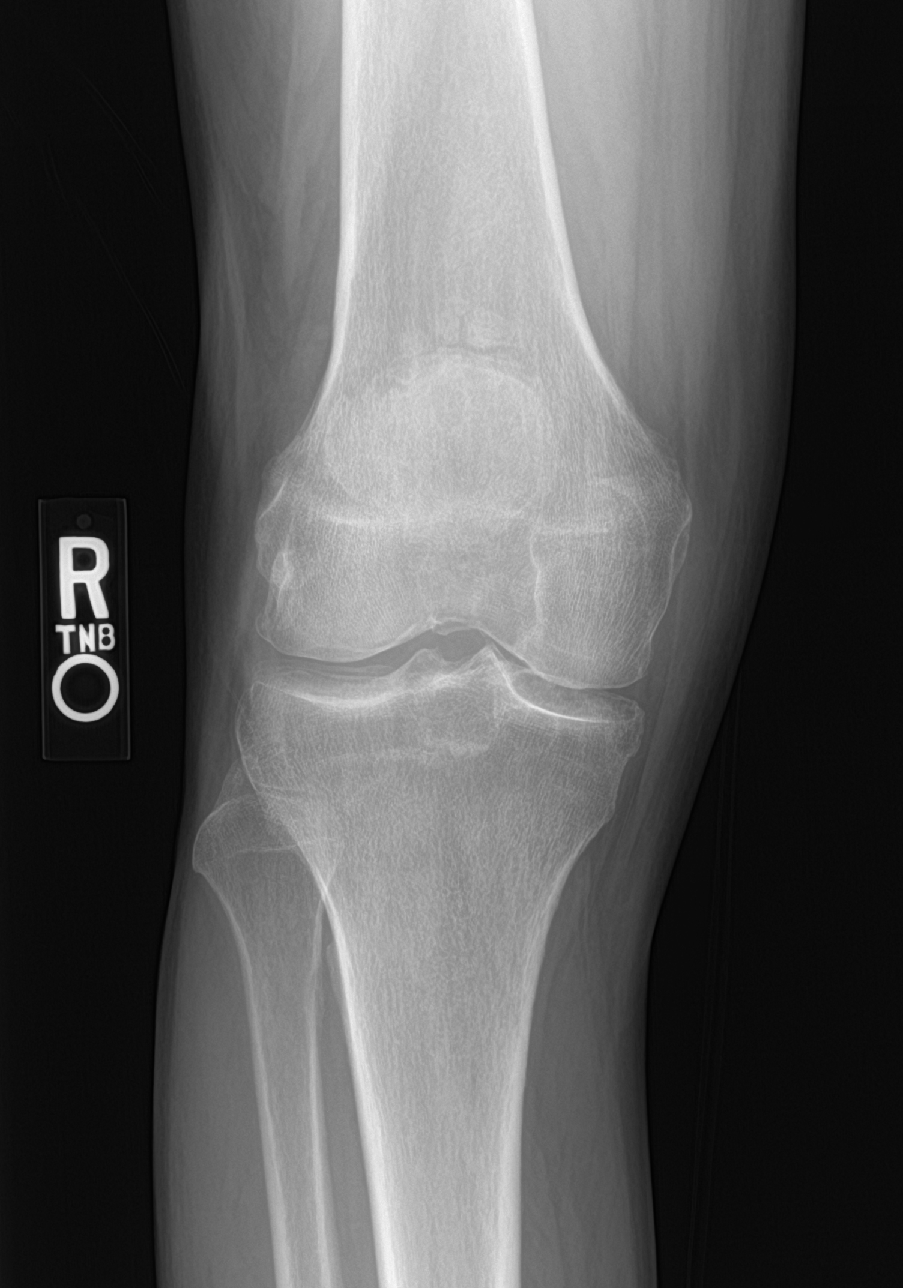

[knee obl (2 of 2)]
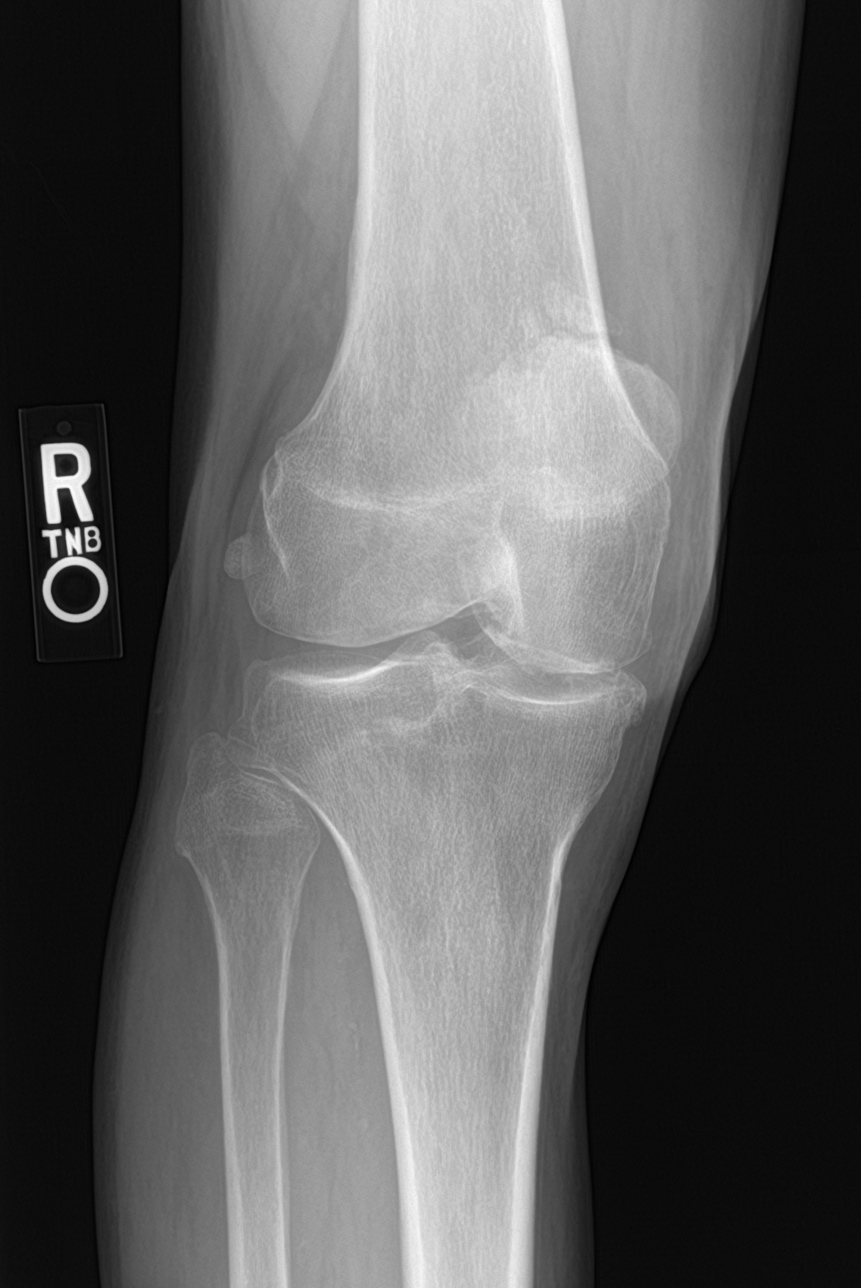

[knee lat]
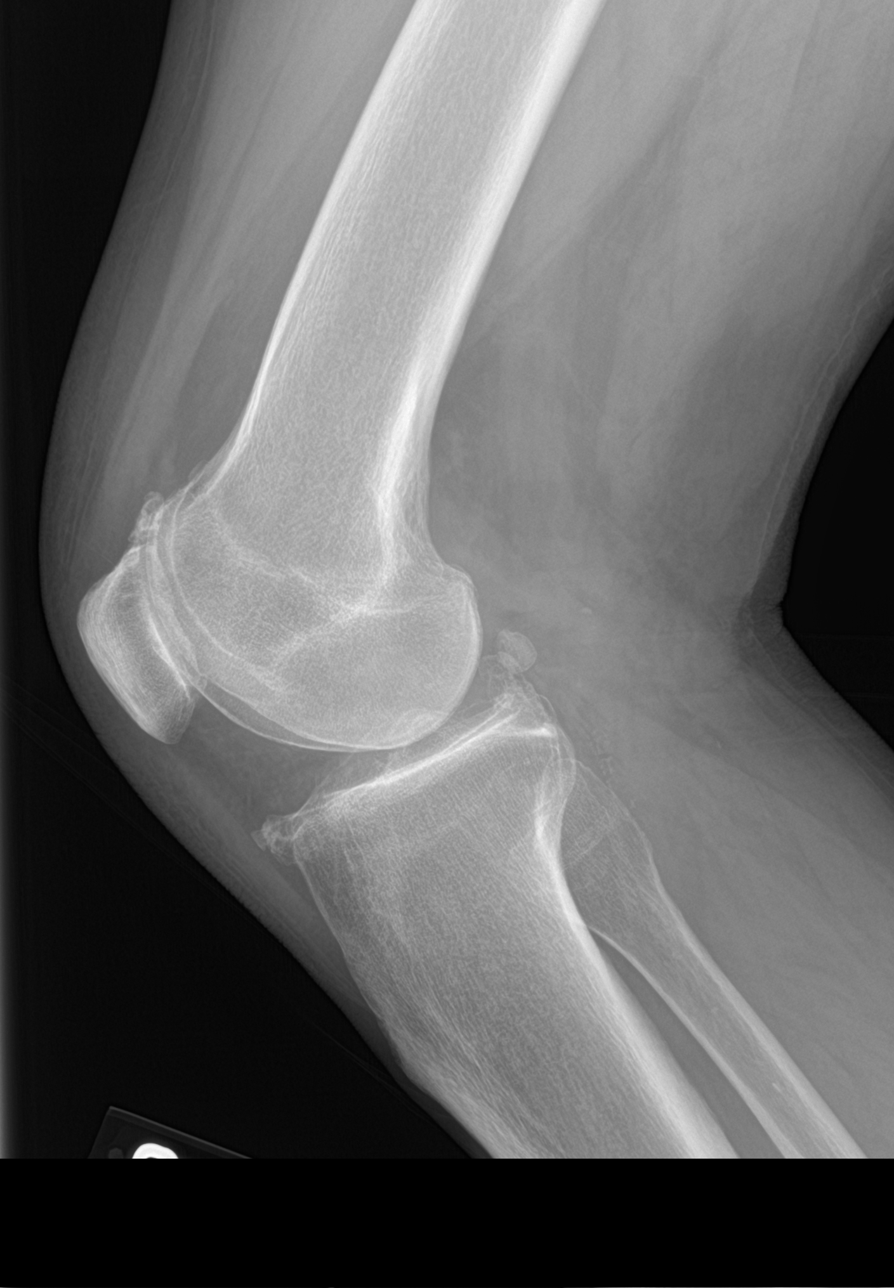

[knee sunrise]
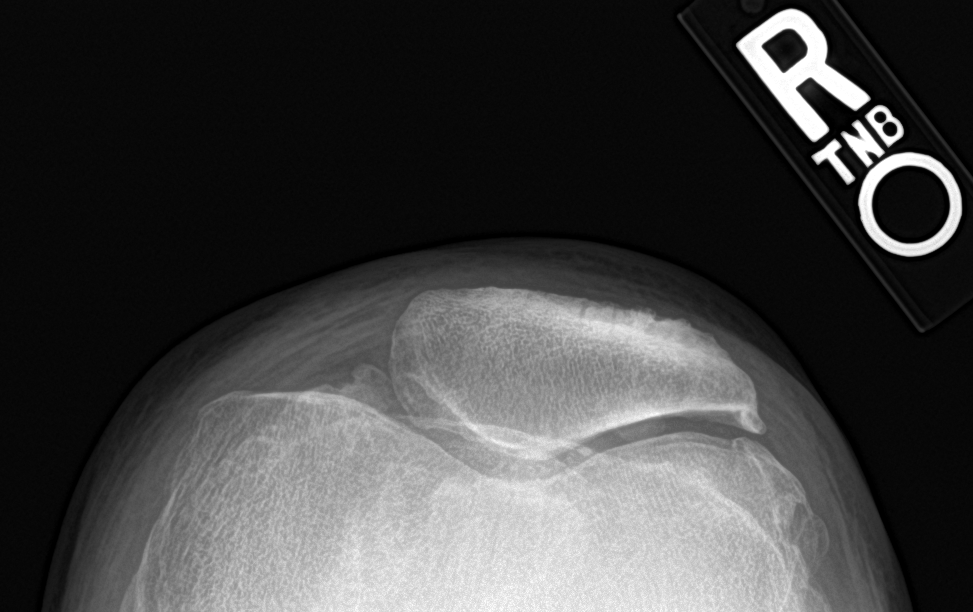

[5 of 5 positions shown; findings below may reference images not displayed]

FINDINGS: Moderate tricompartmental degenerative changes but no acute fracture
or osteochondral lesion. No chondrocalcinosis. No joint effusion.
IMPRESSION: Tricompartmental degenerative changes but no acute bony findings or
joint effusion.

## 2021-03-21 ENCOUNTER — Other Ambulatory Visit (HOSPITAL_COMMUNITY): Payer: Self-pay

## 2021-03-26 ENCOUNTER — Other Ambulatory Visit (HOSPITAL_COMMUNITY): Payer: Self-pay

## 2021-03-26 ENCOUNTER — Other Ambulatory Visit: Payer: Self-pay | Admitting: Hematology and Oncology

## 2021-03-26 MED ORDER — ABIRATERONE ACETATE 250 MG PO TABS
1000.0000 mg | ORAL_TABLET | Freq: Every day | ORAL | 0 refills | Status: DC
  Filled 2021-04-03 – 2021-04-29 (×2): qty 120, 30d supply, fill #0

## 2021-03-28 ENCOUNTER — Other Ambulatory Visit (HOSPITAL_COMMUNITY): Payer: Self-pay

## 2021-03-31 DIAGNOSIS — Z720 Tobacco use: Secondary | ICD-10-CM | POA: Diagnosis not present

## 2021-03-31 DIAGNOSIS — G40909 Epilepsy, unspecified, not intractable, without status epilepticus: Secondary | ICD-10-CM | POA: Diagnosis not present

## 2021-03-31 DIAGNOSIS — Z9989 Dependence on other enabling machines and devices: Secondary | ICD-10-CM | POA: Diagnosis not present

## 2021-03-31 DIAGNOSIS — I739 Peripheral vascular disease, unspecified: Secondary | ICD-10-CM | POA: Diagnosis not present

## 2021-03-31 DIAGNOSIS — E785 Hyperlipidemia, unspecified: Secondary | ICD-10-CM | POA: Diagnosis not present

## 2021-03-31 DIAGNOSIS — I10 Essential (primary) hypertension: Secondary | ICD-10-CM | POA: Diagnosis not present

## 2021-03-31 DIAGNOSIS — M1712 Unilateral primary osteoarthritis, left knee: Secondary | ICD-10-CM | POA: Diagnosis not present

## 2021-03-31 DIAGNOSIS — F17210 Nicotine dependence, cigarettes, uncomplicated: Secondary | ICD-10-CM | POA: Diagnosis not present

## 2021-03-31 DIAGNOSIS — C61 Malignant neoplasm of prostate: Secondary | ICD-10-CM | POA: Diagnosis not present

## 2021-03-31 DIAGNOSIS — E781 Pure hyperglyceridemia: Secondary | ICD-10-CM | POA: Diagnosis not present

## 2021-03-31 DIAGNOSIS — M150 Primary generalized (osteo)arthritis: Secondary | ICD-10-CM | POA: Diagnosis not present

## 2021-03-31 DIAGNOSIS — M25561 Pain in right knee: Secondary | ICD-10-CM | POA: Diagnosis not present

## 2021-04-01 ENCOUNTER — Other Ambulatory Visit (HOSPITAL_COMMUNITY): Payer: Self-pay

## 2021-04-02 ENCOUNTER — Other Ambulatory Visit (HOSPITAL_COMMUNITY): Payer: Self-pay

## 2021-04-03 ENCOUNTER — Other Ambulatory Visit (HOSPITAL_COMMUNITY): Payer: Self-pay

## 2021-04-10 ENCOUNTER — Ambulatory Visit: Payer: Medicare HMO

## 2021-04-10 ENCOUNTER — Other Ambulatory Visit (HOSPITAL_COMMUNITY): Payer: Self-pay

## 2021-04-10 DIAGNOSIS — M5136 Other intervertebral disc degeneration, lumbar region: Secondary | ICD-10-CM | POA: Diagnosis not present

## 2021-04-10 DIAGNOSIS — Z79899 Other long term (current) drug therapy: Secondary | ICD-10-CM | POA: Diagnosis not present

## 2021-04-10 DIAGNOSIS — F17210 Nicotine dependence, cigarettes, uncomplicated: Secondary | ICD-10-CM | POA: Diagnosis not present

## 2021-04-10 DIAGNOSIS — I10 Essential (primary) hypertension: Secondary | ICD-10-CM | POA: Diagnosis not present

## 2021-04-10 DIAGNOSIS — M1712 Unilateral primary osteoarthritis, left knee: Secondary | ICD-10-CM | POA: Diagnosis not present

## 2021-04-10 DIAGNOSIS — Z6825 Body mass index (BMI) 25.0-25.9, adult: Secondary | ICD-10-CM | POA: Diagnosis not present

## 2021-04-10 DIAGNOSIS — C61 Malignant neoplasm of prostate: Secondary | ICD-10-CM | POA: Diagnosis not present

## 2021-04-10 DIAGNOSIS — Z9181 History of falling: Secondary | ICD-10-CM | POA: Diagnosis not present

## 2021-04-10 DIAGNOSIS — M21372 Foot drop, left foot: Secondary | ICD-10-CM | POA: Diagnosis not present

## 2021-04-11 DIAGNOSIS — M545 Low back pain, unspecified: Secondary | ICD-10-CM | POA: Diagnosis not present

## 2021-04-11 DIAGNOSIS — M25561 Pain in right knee: Secondary | ICD-10-CM | POA: Diagnosis not present

## 2021-04-14 ENCOUNTER — Other Ambulatory Visit: Payer: Self-pay

## 2021-04-14 ENCOUNTER — Inpatient Hospital Stay: Payer: Medicare HMO | Attending: Hematology and Oncology

## 2021-04-14 VITALS — BP 134/66 | HR 75 | Temp 98.6°F | Resp 18

## 2021-04-14 DIAGNOSIS — C772 Secondary and unspecified malignant neoplasm of intra-abdominal lymph nodes: Secondary | ICD-10-CM

## 2021-04-14 DIAGNOSIS — C61 Malignant neoplasm of prostate: Secondary | ICD-10-CM | POA: Diagnosis not present

## 2021-04-14 DIAGNOSIS — Z79818 Long term (current) use of other agents affecting estrogen receptors and estrogen levels: Secondary | ICD-10-CM | POA: Insufficient documentation

## 2021-04-14 MED ORDER — LEUPROLIDE ACETATE (3 MONTH) 22.5 MG ~~LOC~~ KIT
22.5000 mg | PACK | Freq: Once | SUBCUTANEOUS | Status: AC
  Administered 2021-04-14: 22.5 mg via SUBCUTANEOUS

## 2021-04-14 MED ORDER — LEUPROLIDE ACETATE (3 MONTH) 22.5 MG ~~LOC~~ KIT
PACK | SUBCUTANEOUS | Status: AC
  Filled 2021-04-14: qty 22.5

## 2021-04-15 NOTE — Progress Notes (Signed)
..  Spoilage occurred on 04/14/2021,Medication dispensed: Eligard 22.'5mg'$ , per Shearon Balo, RN advised pharmacy of waste/spoilage of medication due to leaked while mixing causing waste, nurse pulled another dose to dispense to patient. Nurse disposed of spoilage in sharpe's container and did not retain.Product manager was advised of (spoilage/waste/defective) waste and [employer] will not replace dose due to not having lot or expiration for dose, also needed to return to manufacture without they will not replace product. Finance team advised of replacement of waste.  Case #:none Lot:  Nurse Discarded  Exp: Nurse Discarded

## 2021-04-17 ENCOUNTER — Other Ambulatory Visit (HOSPITAL_COMMUNITY): Payer: Self-pay

## 2021-04-18 DIAGNOSIS — M545 Low back pain, unspecified: Secondary | ICD-10-CM | POA: Diagnosis not present

## 2021-04-20 DIAGNOSIS — M130 Polyarthritis, unspecified: Secondary | ICD-10-CM | POA: Diagnosis not present

## 2021-04-20 DIAGNOSIS — I10 Essential (primary) hypertension: Secondary | ICD-10-CM | POA: Diagnosis not present

## 2021-04-20 DIAGNOSIS — G40909 Epilepsy, unspecified, not intractable, without status epilepticus: Secondary | ICD-10-CM | POA: Diagnosis not present

## 2021-04-20 DIAGNOSIS — E7849 Other hyperlipidemia: Secondary | ICD-10-CM | POA: Diagnosis not present

## 2021-04-21 DIAGNOSIS — M545 Low back pain, unspecified: Secondary | ICD-10-CM | POA: Diagnosis not present

## 2021-04-29 ENCOUNTER — Other Ambulatory Visit (HOSPITAL_COMMUNITY): Payer: Self-pay

## 2021-05-07 ENCOUNTER — Other Ambulatory Visit (HOSPITAL_COMMUNITY): Payer: Self-pay

## 2021-05-12 DIAGNOSIS — Z9181 History of falling: Secondary | ICD-10-CM | POA: Diagnosis not present

## 2021-05-12 DIAGNOSIS — Z6825 Body mass index (BMI) 25.0-25.9, adult: Secondary | ICD-10-CM | POA: Diagnosis not present

## 2021-05-12 DIAGNOSIS — M1712 Unilateral primary osteoarthritis, left knee: Secondary | ICD-10-CM | POA: Diagnosis not present

## 2021-05-12 DIAGNOSIS — C61 Malignant neoplasm of prostate: Secondary | ICD-10-CM | POA: Diagnosis not present

## 2021-05-12 DIAGNOSIS — Z79899 Other long term (current) drug therapy: Secondary | ICD-10-CM | POA: Diagnosis not present

## 2021-05-12 DIAGNOSIS — M5136 Other intervertebral disc degeneration, lumbar region: Secondary | ICD-10-CM | POA: Diagnosis not present

## 2021-05-12 DIAGNOSIS — F17210 Nicotine dependence, cigarettes, uncomplicated: Secondary | ICD-10-CM | POA: Diagnosis not present

## 2021-05-12 DIAGNOSIS — M21372 Foot drop, left foot: Secondary | ICD-10-CM | POA: Diagnosis not present

## 2021-05-12 DIAGNOSIS — I10 Essential (primary) hypertension: Secondary | ICD-10-CM | POA: Diagnosis not present

## 2021-05-14 DIAGNOSIS — Z79899 Other long term (current) drug therapy: Secondary | ICD-10-CM | POA: Diagnosis not present

## 2021-05-15 ENCOUNTER — Other Ambulatory Visit (HOSPITAL_COMMUNITY): Payer: Self-pay

## 2021-05-19 ENCOUNTER — Other Ambulatory Visit (HOSPITAL_COMMUNITY): Payer: Self-pay

## 2021-05-19 ENCOUNTER — Other Ambulatory Visit: Payer: Self-pay | Admitting: Hematology and Oncology

## 2021-05-20 ENCOUNTER — Other Ambulatory Visit (HOSPITAL_COMMUNITY): Payer: Self-pay

## 2021-05-21 DIAGNOSIS — G40909 Epilepsy, unspecified, not intractable, without status epilepticus: Secondary | ICD-10-CM | POA: Diagnosis not present

## 2021-05-21 DIAGNOSIS — E7849 Other hyperlipidemia: Secondary | ICD-10-CM | POA: Diagnosis not present

## 2021-05-21 DIAGNOSIS — I10 Essential (primary) hypertension: Secondary | ICD-10-CM | POA: Diagnosis not present

## 2021-05-21 DIAGNOSIS — M130 Polyarthritis, unspecified: Secondary | ICD-10-CM | POA: Diagnosis not present

## 2021-05-22 DIAGNOSIS — M5416 Radiculopathy, lumbar region: Secondary | ICD-10-CM | POA: Diagnosis not present

## 2021-05-22 DIAGNOSIS — I10 Essential (primary) hypertension: Secondary | ICD-10-CM | POA: Diagnosis not present

## 2021-06-06 DIAGNOSIS — F17210 Nicotine dependence, cigarettes, uncomplicated: Secondary | ICD-10-CM | POA: Diagnosis not present

## 2021-06-06 DIAGNOSIS — Z9181 History of falling: Secondary | ICD-10-CM | POA: Diagnosis not present

## 2021-06-06 DIAGNOSIS — M5136 Other intervertebral disc degeneration, lumbar region: Secondary | ICD-10-CM | POA: Diagnosis not present

## 2021-06-06 DIAGNOSIS — Z79899 Other long term (current) drug therapy: Secondary | ICD-10-CM | POA: Diagnosis not present

## 2021-06-06 DIAGNOSIS — M1712 Unilateral primary osteoarthritis, left knee: Secondary | ICD-10-CM | POA: Diagnosis not present

## 2021-06-06 DIAGNOSIS — M21372 Foot drop, left foot: Secondary | ICD-10-CM | POA: Diagnosis not present

## 2021-06-06 DIAGNOSIS — I10 Essential (primary) hypertension: Secondary | ICD-10-CM | POA: Diagnosis not present

## 2021-06-06 DIAGNOSIS — C61 Malignant neoplasm of prostate: Secondary | ICD-10-CM | POA: Diagnosis not present

## 2021-06-09 DIAGNOSIS — M150 Primary generalized (osteo)arthritis: Secondary | ICD-10-CM | POA: Diagnosis not present

## 2021-06-09 DIAGNOSIS — R569 Unspecified convulsions: Secondary | ICD-10-CM | POA: Diagnosis not present

## 2021-06-09 DIAGNOSIS — I10 Essential (primary) hypertension: Secondary | ICD-10-CM | POA: Diagnosis not present

## 2021-06-09 DIAGNOSIS — Z79899 Other long term (current) drug therapy: Secondary | ICD-10-CM | POA: Diagnosis not present

## 2021-06-09 DIAGNOSIS — I739 Peripheral vascular disease, unspecified: Secondary | ICD-10-CM | POA: Diagnosis not present

## 2021-06-09 DIAGNOSIS — C61 Malignant neoplasm of prostate: Secondary | ICD-10-CM | POA: Diagnosis not present

## 2021-06-09 DIAGNOSIS — Z720 Tobacco use: Secondary | ICD-10-CM | POA: Diagnosis not present

## 2021-06-11 ENCOUNTER — Other Ambulatory Visit: Payer: Self-pay | Admitting: Hematology and Oncology

## 2021-06-11 ENCOUNTER — Other Ambulatory Visit (HOSPITAL_COMMUNITY): Payer: Self-pay

## 2021-06-11 ENCOUNTER — Telehealth: Payer: Self-pay | Admitting: Hematology and Oncology

## 2021-06-11 ENCOUNTER — Encounter (HOSPITAL_COMMUNITY): Payer: Medicare HMO

## 2021-06-11 NOTE — Telephone Encounter (Signed)
Scheduled per sch msg. Called and left msg  

## 2021-06-12 ENCOUNTER — Other Ambulatory Visit (HOSPITAL_COMMUNITY): Payer: Self-pay

## 2021-06-13 ENCOUNTER — Other Ambulatory Visit: Payer: Self-pay | Admitting: Hematology and Oncology

## 2021-06-13 ENCOUNTER — Other Ambulatory Visit (HOSPITAL_COMMUNITY): Payer: Self-pay

## 2021-06-13 MED ORDER — PREDNISONE 5 MG PO TABS
ORAL_TABLET | Freq: Every day | ORAL | 3 refills | Status: DC
  Filled 2021-06-13: qty 30, 30d supply, fill #0
  Filled 2021-07-08: qty 30, 30d supply, fill #1
  Filled 2021-07-13: qty 30, 30d supply, fill #2

## 2021-06-17 ENCOUNTER — Other Ambulatory Visit (HOSPITAL_COMMUNITY): Payer: Self-pay

## 2021-06-18 ENCOUNTER — Inpatient Hospital Stay: Payer: Medicare HMO | Attending: Hematology and Oncology | Admitting: Hematology and Oncology

## 2021-06-18 ENCOUNTER — Other Ambulatory Visit: Payer: Self-pay

## 2021-06-18 ENCOUNTER — Inpatient Hospital Stay: Payer: Medicare HMO

## 2021-06-18 ENCOUNTER — Encounter: Payer: Self-pay | Admitting: Hematology and Oncology

## 2021-06-18 VITALS — BP 157/90 | HR 82 | Temp 98.3°F | Resp 18 | Ht 69.0 in

## 2021-06-18 DIAGNOSIS — C772 Secondary and unspecified malignant neoplasm of intra-abdominal lymph nodes: Secondary | ICD-10-CM | POA: Insufficient documentation

## 2021-06-18 DIAGNOSIS — M25561 Pain in right knee: Secondary | ICD-10-CM | POA: Insufficient documentation

## 2021-06-18 DIAGNOSIS — M79604 Pain in right leg: Secondary | ICD-10-CM | POA: Diagnosis not present

## 2021-06-18 DIAGNOSIS — F17210 Nicotine dependence, cigarettes, uncomplicated: Secondary | ICD-10-CM | POA: Insufficient documentation

## 2021-06-18 DIAGNOSIS — Z791 Long term (current) use of non-steroidal anti-inflammatories (NSAID): Secondary | ICD-10-CM | POA: Insufficient documentation

## 2021-06-18 DIAGNOSIS — R4789 Other speech disturbances: Secondary | ICD-10-CM | POA: Diagnosis not present

## 2021-06-18 DIAGNOSIS — R350 Frequency of micturition: Secondary | ICD-10-CM | POA: Insufficient documentation

## 2021-06-18 DIAGNOSIS — Z7952 Long term (current) use of systemic steroids: Secondary | ICD-10-CM | POA: Insufficient documentation

## 2021-06-18 DIAGNOSIS — C61 Malignant neoplasm of prostate: Secondary | ICD-10-CM

## 2021-06-18 DIAGNOSIS — Z79899 Other long term (current) drug therapy: Secondary | ICD-10-CM | POA: Diagnosis not present

## 2021-06-18 LAB — COMPREHENSIVE METABOLIC PANEL
ALT: <6 U/L (ref 0–44)
AST: 11 U/L — ABNORMAL LOW (ref 15–41)
Albumin: 3.9 g/dL (ref 3.5–5.0)
Alkaline Phosphatase: 84 U/L (ref 38–126)
Anion gap: 13 (ref 5–15)
BUN: 13 mg/dL (ref 8–23)
CO2: 21 mmol/L — ABNORMAL LOW (ref 22–32)
Calcium: 9.2 mg/dL (ref 8.9–10.3)
Chloride: 107 mmol/L (ref 98–111)
Creatinine, Ser: 1.01 mg/dL (ref 0.61–1.24)
GFR, Estimated: >60 mL/min (ref >60)
Glucose, Bld: 158 mg/dL — ABNORMAL HIGH (ref 70–99)
Potassium: 4.1 mmol/L (ref 3.5–5.1)
Sodium: 141 mmol/L (ref 135–145)
Total Bilirubin: <0.2 mg/dL — ABNORMAL LOW (ref 0.3–1.2)
Total Protein: 7.4 g/dL (ref 6.5–8.1)

## 2021-06-18 LAB — CBC WITH DIFFERENTIAL/PLATELET
Abs Immature Granulocytes: 0.06 10*3/uL (ref 0.00–0.07)
Basophils Absolute: 0.1 10*3/uL (ref 0.0–0.1)
Basophils Relative: 1 %
Eosinophils Absolute: 0.2 10*3/uL (ref 0.0–0.5)
Eosinophils Relative: 3 %
HCT: 30.1 % — ABNORMAL LOW (ref 39.0–52.0)
Hemoglobin: 9.9 g/dL — ABNORMAL LOW (ref 13.0–17.0)
Immature Granulocytes: 1 %
Lymphocytes Relative: 19 %
Lymphs Abs: 1.4 10*3/uL (ref 0.7–4.0)
MCH: 29.9 pg (ref 26.0–34.0)
MCHC: 32.9 g/dL (ref 30.0–36.0)
MCV: 90.9 fL (ref 80.0–100.0)
Monocytes Absolute: 0.5 10*3/uL (ref 0.1–1.0)
Monocytes Relative: 6 %
Neutro Abs: 5.4 10*3/uL (ref 1.7–7.7)
Neutrophils Relative %: 70 %
Platelets: 283 10*3/uL (ref 150–400)
RBC: 3.31 MIL/uL — ABNORMAL LOW (ref 4.22–5.81)
RDW: 13.8 % (ref 11.5–15.5)
WBC: 7.6 10*3/uL (ref 4.0–10.5)
nRBC: 0 % (ref 0.0–0.2)

## 2021-06-18 NOTE — Assessment & Plan Note (Signed)
I do not believe this is related to his metastatic prostate cancer.  In the past we have done a bone scan which did not show any evidence of involvement.  He was recommended to continue follow-up with orthopedics for management of his acute knee pain.  He and his wife are both informed to call the orthopedics office back for follow-up.

## 2021-06-18 NOTE — Progress Notes (Signed)
[name] CONSULT NOTE  Patient Care Team: Iona Beard, MD as PCP - General (Family Medicine)  CHIEF COMPLAINTS/PURPOSE OF CONSULTATION:  Abnormal imaging.  ASSESSMENT & PLAN:   Prostate cancer metastatic to intraabdominal lymph node [name]) This is a very pleasant 75 yr old male patient with PMH significant for prostate adenocarcinoma Gleason 3+4, no LN involvement s.p robotic prostatectomy in 2013, presented with peri aortic left iliac and peri rectal LN, with biopsy confirming metastatic prostate adenoca now on zytiga and lupron presents here for FU on his metastatic prostate cancer. Baseline PSA of 207.  He is here for follow-up.  Since his last visit, he denies any health complaints except for the ongoing right knee pain.  According to wife, he has seen orthopedics in the first week of August and they recommended some steroid shots to his back or surgery.  However he has not followed up with them. He has not been seen since June, no showed his last appointment.  But not quite sure if he is taking Zytiga as recommended. I have asked him to proceed with CBC, CMP and PSA today.  He will come back to see me in about 2 weeks and he will bring all his medications for verification.  If he is not interested in pursuing Zytiga, we can just proceed with leuprolide every 3 months and monitor. I also reinforced the importance of being monitored every 4 weeks with liver function tests as well as electrolytes while on Zytiga.  He expressed understanding of the recommendations. He will follow-up as recommended.  Right leg pain I do not believe this is related to his metastatic prostate cancer.  In the past we have done a bone scan which did not show any evidence of involvement.  He was recommended to continue follow-up with orthopedics for management of his acute knee pain.  He and his wife are both informed to call the orthopedics office back for follow-up.  Orders Placed This  Encounter  Procedures   CBC with Differential/Platelet    Standing Status:   Standing    Number of Occurrences:   22    Standing Expiration Date:   06/18/2022   Comprehensive metabolic panel    Standing Status:   Standing    Number of Occurrences:   33    Standing Expiration Date:   06/18/2022   PSA, total and free    Standing Status:   Future    Number of Occurrences:   1    Standing Expiration Date:   06/18/2022     HISTORY OF PRESENTING ILLNESS:   Oncology History Overview Note  Joseph Rangel 75 y.o. male is here because of some abnormal lymphadenopathy identified on CT angiogram especially given his history of prostate cancer.  He had robotic-assisted laparoscopic radical retropubic prostatectomy with bilateral pelvic lymph node dissection for a T1c prostate adenocarcinoma. Final pathology from this surgery showed prostatic adenocarcinoma , gleasons score 3+4=7, involving both lobes, no evidence of angiolymphatic invasion, extraprostatic extension or seminal vesicle involvement. All resection margins are clear.  CT showed periaortic, left iliac, peri rectal LAD concerning for metastatic disease, and hence referred to Oncology.  He no showed to his first appointment given lack of transport, and came an hr late for his second appointment. He is here with his wife, he tells me that some LN were enlarged on a CT scan and his doctor wants to make sure he doesn't have cancer again. He denies any changes to his  pain, has chronic back pain and pain in his legs for which he takes pain medication and FU with his PCP and pain management. PSA of 207, PET imaging with adenopathy in the abdomen and pelvis in this patient with elevated PSA showing low level FDG uptake most suggestive of prostate metastases. Other disease processes that could have this distribution rectal cancer and lymphoma. The lack of pronounced FDG uptake in these lymph nodes and the reported PSA level make these less  likely. Biopsy of RPLN confirmed prostate adenoca. He started taking casodex and had his first lupron on 10/18/2020 He started taking zytiga but was only taking one tab a day, despite instructions to take 4 a day. Although there is some new literature suggesting to take one tab if you eat a fatty meal, it is not reliable if he would end up eating a fatty meal every day, and also give concomitant use of phenytoin which is an inducer, in theory he may need more than 4 tabs a day. He is on Zytiga 1000 mg po daily with prednisone.   Prostate cancer metastatic to intraabdominal lymph node ([name])  10/02/2020 Initial Diagnosis   Prostate cancer metastatic to intraabdominal lymph node ([name])   10/02/2020 Cancer Staging   Staging form: Prostate, AJCC 8th Edition - Clinical: Stage IVB (cTX, cNX, pM1a, PSA: 207) - Signed by Benay Pike, MD on 10/02/2020    Interim History  Joseph Rangel is here with his wife. He is in a wheel chair. He still complains of right knee pain. According to wife,  he has seen ortho in August and they recommended injections to his back and knee. He has however not followed up with them yet. He says he is taking all his medication, but when asked how many pills of zytiga has he been taking, he says one. It is not very clear what medications he has been taking actually Last Eligard July 2022. He denies any nausea, vomiting, diarrhea, any other bone pains, change in urinary habits. No falls. Rest of the pertinent 10 point ROS reviewed and neg  MEDICAL HISTORY:  Past Medical History:  Diagnosis Date   Arthritis    Cancer ([name])    prostate    Frequency of urination    Hypertension    Seizures ([name])    after brain surg - none in past 20 yrs   Speech disorder    due to brain surg    SURGICAL HISTORY: Past Surgical History:  Procedure Laterality Date   BRAIN SURGERY     BLOOD CLOT REMOVED 1980   MASS EXCISION  02/24/2012   Procedure: MINOR EXCISION OF MASS;  Surgeon: Bernestine Amass, MD;  Location: [facility];  Service: Urology;;  excision of neck lesion   ROBOT ASSISTED LAPAROSCOPIC RADICAL PROSTATECTOMY  02/24/2012   Procedure: ROBOTIC ASSISTED LAPAROSCOPIC RADICAL PROSTATECTOMY;  Surgeon: Bernestine Amass, MD;  Location: [facility];  Service: Urology;  Laterality: N/A;           SOCIAL HISTORY: Social History   Socioeconomic History   Marital status: Married    Spouse name: Not on file   Number of children: Not on file   Years of education: Not on file   Highest education level: Not on file  Occupational History   Not on file  Tobacco Use   Smoking status: Every Day    Types: Cigarettes   Smokeless tobacco: Never   Tobacco comments:    2 cigarettes per day  Vaping Use   Vaping Use: Never used  Substance and Sexual Activity   Alcohol use: No   Drug use: No   Sexual activity: Not on file  Other Topics Concern   Not on file  Social History Narrative   Not on file   Social Determinants of Health   Financial Resource Strain: Not on file  Food Insecurity: Not on file  Transportation Needs: Not on file  Physical Activity: Not on file  Stress: Not on file  Social Connections: Not on file  Intimate Partner Violence: Not on file    FAMILY HISTORY: No family history on file.  ALLERGIES:  has No Known Allergies.  MEDICATIONS:  Current Outpatient Medications  Medication Sig Dispense Refill   abiraterone acetate (ZYTIGA) 250 MG tablet TAKE 4 TABLETS (1,000 MG TOTAL) BY MOUTH DAILY. TAKE ON AN EMPTY STOMACH 1 HOUR BEFORE OR 2 HOURS AFTER A MEAL 120 tablet 0   atenolol (TENORMIN) 25 MG tablet Take 25 mg by mouth daily.     cilostazol (PLETAL) 50 MG tablet Take 50 mg by mouth daily.     fenofibrate (TRICOR) 48 MG tablet Take 48 mg by mouth daily.     hydrochlorothiazide (HYDRODIURIL) 25 MG tablet Take 25 mg by mouth daily.     HYDROcodone-acetaminophen (NORCO) 10-325 MG tablet Take 1 tablet by mouth every 4 (four) hours as needed for moderate pain.      latanoprost (XALATAN) 0.005 % ophthalmic solution Place 1 drop into both eyes every other day. At night     meloxicam (MOBIC) 15 MG tablet Take 15 mg by mouth daily.     olmesartan (BENICAR) 40 MG tablet Take 40 mg by mouth daily.     phenytoin (DILANTIN) 100 MG ER capsule Take 300 mg by mouth daily.     predniSONE (DELTASONE) 5 MG tablet TAKE 1 TABLET BY MOUTH ONCE A DAY WITH BREAKFAST 30 tablet 3   verapamil (VERELAN PM) 360 MG 24 hr capsule Take 360 mg by mouth daily.     potassium chloride SA (KLOR-CON) 20 MEQ tablet Take 2 tablets (40 mEq total) by mouth daily for 5 days, THEN 1 tablet (20 mEq total) daily. 102 tablet 0   No current facility-administered medications for this visit.    PHYSICAL EXAMINATION:  ECOG PERFORMANCE STATUS: 3 - Symptomatic, >50% confined to bed  Vitals:   06/18/21 1102  BP: (!) 157/90  Pulse: 82  Resp: 18  Temp: 98.3 F (36.8 C)  SpO2: 98%   There were no vitals filed for this visit. Physical Exam Constitutional:      Appearance: Normal appearance.  HENT:     Head: Normocephalic and atraumatic.  Cardiovascular:     Rate and Rhythm: Normal rate and regular rhythm.     Pulses: Normal pulses.     Heart sounds: Normal heart sounds.  Pulmonary:     Effort: Pulmonary effort is normal.     Breath sounds: Normal breath sounds.  Abdominal:     General: Abdomen is flat. Bowel sounds are normal. There is no distension.     Palpations: Abdomen is soft. There is no mass.  Musculoskeletal:        General: No swelling (No swelling of RLE) or tenderness. Normal range of motion.     Cervical back: Normal range of motion and neck supple.     Right lower leg: No edema.     Left lower leg: No edema.     Comments: He  is in a wheelchair today, says his right leg bothers him and it gives out randomly.  Lymphadenopathy:     Cervical: No cervical adenopathy.  Skin:    General: Skin is warm and dry.  Neurological:     General: No focal deficit present.      Mental Status: He is alert.  Psychiatric:        Mood and Affect: Mood normal.        Behavior: Behavior normal.    LABORATORY DATA:  I have reviewed the data as listed Lab Results  Component Value Date   WBC 10.7 (H) 03/06/2021   HGB 9.9 (L) 03/06/2021   HCT 28.9 (L) 03/06/2021   MCV 88.9 03/06/2021   PLT 287 03/06/2021     Chemistry      Component Value Date/Time   NA 142 03/06/2021 1446   K 2.6 (LL) 03/06/2021 1446   CL 99 03/06/2021 1446   CO2 30 03/06/2021 1446   BUN 13 03/06/2021 1446   CREATININE 1.07 03/06/2021 1446      Component Value Date/Time   CALCIUM 9.3 03/06/2021 1446   ALKPHOS 92 02/03/2021 1141   AST 14 (L) 02/03/2021 1141   ALT <6 02/03/2021 1141   BILITOT 0.4 02/03/2021 1141     Labs from today are pending  RADIOGRAPHIC STUDIES: I have personally reviewed the radiological images as listed and agreed with the findings in the report. No results found.   All questions were answered. The patient knows to call the clinic with any problems, questions or concerns. I spent 30 minutes in the care of this patient including history and physical, review of records, counseling and coordination of care.  We have discussed about staying on Zytiga and prednisone as recommended, every 4 weekly labs, Eligard every 3 months and clinical monitoring every 4 weeks.    Benay Pike, MD 06/18/2021 11:42 AM

## 2021-06-18 NOTE — Assessment & Plan Note (Signed)
This is a very pleasant 75 yr old male patient with PMH significant for prostate adenocarcinoma Gleason 3+4, no LN involvement s.p robotic prostatectomy in 2013, presented with peri aortic left iliac and peri rectal LN, with biopsy confirming metastatic prostate adenoca now on zytiga and lupron presents here for FU on his metastatic prostate cancer. Baseline PSA of 207.  He is here for follow-up.  Since his last visit, he denies any health complaints except for the ongoing right knee pain.  According to wife, he has seen orthopedics in the first week of August and they recommended some steroid shots to his back or surgery.  However he has not followed up with them. He has not been seen since June, no showed his last appointment.  But not quite sure if he is taking Zytiga as recommended. I have asked him to proceed with CBC, CMP and PSA today.  He will come back to see me in about 2 weeks and he will bring all his medications for verification.  If he is not interested in pursuing Zytiga, we can just proceed with leuprolide every 3 months and monitor. I also reinforced the importance of being monitored every 4 weeks with liver function tests as well as electrolytes while on Zytiga.  He expressed understanding of the recommendations. He will follow-up as recommended.

## 2021-06-19 LAB — PSA, TOTAL AND FREE
PSA, Free Pct: UNDETERMINED %
PSA, Free: <0.01 ng/mL
Prostate Specific Ag, Serum: <0.1 ng/mL (ref 0.0–4.0)

## 2021-06-20 ENCOUNTER — Other Ambulatory Visit: Payer: Self-pay | Admitting: Hematology and Oncology

## 2021-06-20 ENCOUNTER — Other Ambulatory Visit (HOSPITAL_COMMUNITY): Payer: Self-pay

## 2021-06-20 MED ORDER — ABIRATERONE ACETATE 250 MG PO TABS
1000.0000 mg | ORAL_TABLET | Freq: Every day | ORAL | 0 refills | Status: DC
  Filled 2021-06-20: qty 120, 30d supply, fill #0

## 2021-06-25 ENCOUNTER — Other Ambulatory Visit: Payer: Self-pay

## 2021-06-25 ENCOUNTER — Ambulatory Visit: Payer: Medicare HMO

## 2021-06-25 ENCOUNTER — Encounter: Payer: Self-pay | Admitting: Hematology and Oncology

## 2021-06-25 ENCOUNTER — Inpatient Hospital Stay: Payer: Medicare HMO | Attending: Hematology and Oncology | Admitting: Hematology and Oncology

## 2021-06-25 DIAGNOSIS — Z791 Long term (current) use of non-steroidal anti-inflammatories (NSAID): Secondary | ICD-10-CM | POA: Diagnosis not present

## 2021-06-25 DIAGNOSIS — I10 Essential (primary) hypertension: Secondary | ICD-10-CM | POA: Insufficient documentation

## 2021-06-25 DIAGNOSIS — C772 Secondary and unspecified malignant neoplasm of intra-abdominal lymph nodes: Secondary | ICD-10-CM | POA: Diagnosis not present

## 2021-06-25 DIAGNOSIS — Z79899 Other long term (current) drug therapy: Secondary | ICD-10-CM | POA: Insufficient documentation

## 2021-06-25 DIAGNOSIS — R350 Frequency of micturition: Secondary | ICD-10-CM | POA: Insufficient documentation

## 2021-06-25 DIAGNOSIS — G40909 Epilepsy, unspecified, not intractable, without status epilepticus: Secondary | ICD-10-CM | POA: Diagnosis not present

## 2021-06-25 DIAGNOSIS — D649 Anemia, unspecified: Secondary | ICD-10-CM

## 2021-06-25 DIAGNOSIS — F17210 Nicotine dependence, cigarettes, uncomplicated: Secondary | ICD-10-CM | POA: Insufficient documentation

## 2021-06-25 DIAGNOSIS — C61 Malignant neoplasm of prostate: Secondary | ICD-10-CM | POA: Insufficient documentation

## 2021-06-25 NOTE — Assessment & Plan Note (Signed)
This is a very pleasant 75 yr old male patient with PMH significant for prostate adenocarcinoma Gleason 3+4, no LN involvement s.p robotic prostatectomy in 2013, presented with peri aortic left iliac and peri rectal LN, with biopsy confirming metastatic prostate adenoca now on zytiga and lupron presents here for FU on his metastatic prostate cancer. Baseline PSA of 207.  Most recent PSA was 1.01.  He is here for follow-up.  Since his last visit, he denies any health complaints except for the ongoing right knee pain.  I do not believe his right knee pain is related to his prostate adenocarcinoma.  He is yet to make a follow-up with the orthopedic doctor. He was specifically asked to bring all his medications today to verify that he has been taking it as prescribed but he forgot them.  He is due for his Eligard injection on October 21.  He will continue Zytiga 4 tablets a day and prednisone once a day as prescribed.  He will return to clinic every 4 weeks for lab monitoring and toxicity check while on Zytiga.  I have presented him with the option of just proceeding with Eligard however he would like to continue the current regimen at this time.

## 2021-06-25 NOTE — Assessment & Plan Note (Signed)
Normocytic normochromic anemia with hemoglobin of 9.9 g/dL on most recent labs.  No indication for blood transfusion.  We will continue to monitor this.

## 2021-06-25 NOTE — Progress Notes (Signed)
[place] CONSULT NOTE  Patient Care Team: Iona Beard, MD as PCP - General (Family Medicine)  CHIEF COMPLAINTS/PURPOSE OF CONSULTATION:  Abnormal imaging.  ASSESSMENT & PLAN:   Prostate cancer metastatic to intraabdominal lymph node [facility]) This is a very pleasant 75 yr old male patient with PMH significant for prostate adenocarcinoma Gleason 3+4, no LN involvement s.p robotic prostatectomy in 2013, presented with peri aortic left iliac and peri rectal LN, with biopsy confirming metastatic prostate adenoca now on zytiga and lupron presents here for FU on his metastatic prostate cancer. Baseline PSA of 207.  Most recent PSA was 1.01.  He is here for follow-up.  Since his last visit, he denies any health complaints except for the ongoing right knee pain.  I do not believe his right knee pain is related to his prostate adenocarcinoma.  He is yet to make a follow-up with the orthopedic doctor. He was specifically asked to bring all his medications today to verify that he has been taking it as prescribed but he forgot them.  He is due for his Eligard injection on October 21.  He will continue Zytiga 4 tablets a day and prednisone once a day as prescribed.  He will return to clinic every 4 weeks for lab monitoring and toxicity check while on Zytiga.  I have presented him with the option of just proceeding with Eligard however he would like to continue the current regimen at this time.  No orders of the defined types were placed in this encounter.    HISTORY OF PRESENTING ILLNESS:   Oncology History Overview Note  Deavon Bufkin 75 y.o. male is here because of some abnormal lymphadenopathy identified on CT angiogram especially given his history of prostate cancer.  He had robotic-assisted laparoscopic radical retropubic prostatectomy with bilateral pelvic lymph node dissection for a T1c prostate adenocarcinoma. Final pathology from this surgery showed prostatic adenocarcinoma ,  gleasons score 3+4=7, involving both lobes, no evidence of angiolymphatic invasion, extraprostatic extension or seminal vesicle involvement. All resection margins are clear.  CT showed periaortic, left iliac, peri rectal LAD concerning for metastatic disease, and hence referred to Oncology.  He no showed to his first appointment given lack of transport, and came an hr late for his second appointment. He is here with his wife, he tells me that some LN were enlarged on a CT scan and his doctor wants to make sure he doesn't have cancer again. He denies any changes to his pain, has chronic back pain and pain in his legs for which he takes pain medication and FU with his PCP and pain management. PSA of 207, PET imaging with adenopathy in the abdomen and pelvis in this patient with elevated PSA showing low level FDG uptake most suggestive of prostate metastases. Other disease processes that could have this distribution rectal cancer and lymphoma. The lack of pronounced FDG uptake in these lymph nodes and the reported PSA level make these less likely. Biopsy of RPLN confirmed prostate adenoca. He started taking casodex and had his first lupron on 10/18/2020 He started taking zytiga but was only taking one tab a day, despite instructions to take 4 a day. Although there is some new literature suggesting to take one tab if you eat a fatty meal, it is not reliable if he would end up eating a fatty meal every day, and also give concomitant use of phenytoin which is an inducer, in theory he may need more than 4 tabs a day. He  is on Zytiga 1000 mg po daily with prednisone.   Prostate cancer metastatic to intraabdominal lymph node ([place])  10/02/2020 Initial Diagnosis   Prostate cancer metastatic to intraabdominal lymph node ([place])   10/02/2020 Cancer Staging   Staging form: Prostate, AJCC 8th Edition - Clinical: Stage IVB (cTX, cNX, pM1a, PSA: 207) - Signed by Benay Pike, MD on 10/02/2020    Interim  History  Mr Reppond is here with his wife. He is in a wheel chair. We have specifically made this visit to follow-up on his medication list and to make sure he is taking all his medications as prescribed however apparently he forgot his medications.  He denies any new complaints except for ongoing right hip pain.  He is here to follow-up with his orthopedics doctor.  No nausea, vomiting, diarrhea.  He received a new prescription for Zytiga and he started taking 4 tablets a day is what he says.  No falls.  He says there is a lot going on because of some deaths in the family.  Rest of the pertinent 10 point ROS reviewed and neg  MEDICAL HISTORY:  Past Medical History:  Diagnosis Date   Arthritis    Cancer ([place])    prostate    Frequency of urination    Hypertension    Seizures ([place])    after brain surg - none in past 20 yrs   Speech disorder    due to brain surg    SURGICAL HISTORY: Past Surgical History:  Procedure Laterality Date   BRAIN SURGERY     BLOOD CLOT REMOVED 1980   MASS EXCISION  02/24/2012   Procedure: MINOR EXCISION OF MASS;  Surgeon: Bernestine Amass, MD;  Location: [facility];  Service: Urology;;  excision of neck lesion   ROBOT ASSISTED LAPAROSCOPIC RADICAL PROSTATECTOMY  02/24/2012   Procedure: ROBOTIC ASSISTED LAPAROSCOPIC RADICAL PROSTATECTOMY;  Surgeon: Bernestine Amass, MD;  Location: [facility];  Service: Urology;  Laterality: N/A;           SOCIAL HISTORY: Social History   Socioeconomic History   Marital status: Married    Spouse name: Not on file   Number of children: Not on file   Years of education: Not on file   Highest education level: Not on file  Occupational History   Not on file  Tobacco Use   Smoking status: Every Day    Types: Cigarettes   Smokeless tobacco: Never   Tobacco comments:    2 cigarettes per day  Vaping Use   Vaping Use: Never used  Substance and Sexual Activity   Alcohol use: No   Drug use: No   Sexual activity: Not on file  Other  Topics Concern   Not on file  Social History Narrative   Not on file   Social Determinants of Health   Financial Resource Strain: Not on file  Food Insecurity: Not on file  Transportation Needs: Not on file  Physical Activity: Not on file  Stress: Not on file  Social Connections: Not on file  Intimate Partner Violence: Not on file    FAMILY HISTORY: No family history on file.  ALLERGIES:  has No Known Allergies.  MEDICATIONS:  Current Outpatient Medications  Medication Sig Dispense Refill   abiraterone acetate (ZYTIGA) 250 MG tablet TAKE 4 TABLETS (1,000 MG TOTAL) BY MOUTH DAILY. TAKE ON AN EMPTY STOMACH 1 HOUR BEFORE OR 2 HOURS AFTER A MEAL 120 tablet 0   atenolol (TENORMIN) 25 MG tablet  Take 25 mg by mouth daily.     cilostazol (PLETAL) 50 MG tablet Take 50 mg by mouth daily.     fenofibrate (TRICOR) 48 MG tablet Take 48 mg by mouth daily.     hydrochlorothiazide (HYDRODIURIL) 25 MG tablet Take 25 mg by mouth daily.     HYDROcodone-acetaminophen (NORCO) 10-325 MG tablet Take 1 tablet by mouth every 4 (four) hours as needed for moderate pain.     latanoprost (XALATAN) 0.005 % ophthalmic solution Place 1 drop into both eyes every other day. At night     meloxicam (MOBIC) 15 MG tablet Take 15 mg by mouth daily.     olmesartan (BENICAR) 40 MG tablet Take 40 mg by mouth daily.     phenytoin (DILANTIN) 100 MG ER capsule Take 300 mg by mouth daily.     predniSONE (DELTASONE) 5 MG tablet TAKE 1 TABLET BY MOUTH ONCE A DAY WITH BREAKFAST 30 tablet 3   verapamil (VERELAN PM) 360 MG 24 hr capsule Take 360 mg by mouth daily.     potassium chloride SA (KLOR-CON) 20 MEQ tablet Take 2 tablets (40 mEq total) by mouth daily for 5 days, THEN 1 tablet (20 mEq total) daily. 102 tablet 0   No current facility-administered medications for this visit.    PHYSICAL EXAMINATION:  ECOG PERFORMANCE STATUS: 3 - Symptomatic, >50% confined to bed  Vitals:   06/25/21 1147  BP: (!) 141/73  Pulse: 82   Resp: 17  Temp: 98.5 F (36.9 C)  SpO2: 100%   Filed Weights   06/25/21 1147  Weight: 159 lb 6.4 oz (72.3 kg)   Physical exam deferred in lieu of counseling.  We just did a physical exam couple weeks ago.  LABORATORY DATA:  I have reviewed the data as listed Lab Results  Component Value Date   WBC 7.6 06/18/2021   HGB 9.9 (L) 06/18/2021   HCT 30.1 (L) 06/18/2021   MCV 90.9 06/18/2021   PLT 283 06/18/2021     Chemistry      Component Value Date/Time   NA 141 06/18/2021 1150   K 4.1 06/18/2021 1150   CL 107 06/18/2021 1150   CO2 21 (L) 06/18/2021 1150   BUN 13 06/18/2021 1150   CREATININE 1.01 06/18/2021 1150   CREATININE 1.07 03/06/2021 1446      Component Value Date/Time   CALCIUM 9.2 06/18/2021 1150   ALKPHOS 84 06/18/2021 1150   AST 11 (L) 06/18/2021 1150   AST 14 (L) 02/03/2021 1141   ALT <6 06/18/2021 1150   ALT <6 02/03/2021 1141   BILITOT <0.2 (L) 06/18/2021 1150   BILITOT 0.4 02/03/2021 1141     Labs from last visit with PSA less than 0.01.  CMP without any evidence of transaminitis.  RADIOGRAPHIC STUDIES: I have personally reviewed the radiological images as listed and agreed with the findings in the report.  No results found.   All questions were answered. The patient knows to call the clinic with any problems, questions or concerns. I spent 20 minutes in the care of this patient including history review of records, counseling and coordination of care.  We have discussed about staying on Zytiga and prednisone as recommended, every 4 weekly labs, Eligard every 3 months and clinical monitoring every 4 weeks.  He agreed to all of this.  He was clearly instructed to bring all his medications next time to make sure we verify that he is taking everything as prescribed.  Benay Pike, MD 06/25/2021 12:55 PM

## 2021-07-07 DIAGNOSIS — Z79899 Other long term (current) drug therapy: Secondary | ICD-10-CM | POA: Diagnosis not present

## 2021-07-07 DIAGNOSIS — Z0000 Encounter for general adult medical examination without abnormal findings: Secondary | ICD-10-CM | POA: Diagnosis not present

## 2021-07-07 DIAGNOSIS — A048 Other specified bacterial intestinal infections: Secondary | ICD-10-CM | POA: Diagnosis not present

## 2021-07-07 DIAGNOSIS — M5136 Other intervertebral disc degeneration, lumbar region: Secondary | ICD-10-CM | POA: Diagnosis not present

## 2021-07-07 DIAGNOSIS — F17210 Nicotine dependence, cigarettes, uncomplicated: Secondary | ICD-10-CM | POA: Diagnosis not present

## 2021-07-07 DIAGNOSIS — K2980 Duodenitis without bleeding: Secondary | ICD-10-CM | POA: Diagnosis not present

## 2021-07-08 ENCOUNTER — Other Ambulatory Visit (HOSPITAL_COMMUNITY): Payer: Self-pay

## 2021-07-09 DIAGNOSIS — Z79899 Other long term (current) drug therapy: Secondary | ICD-10-CM | POA: Diagnosis not present

## 2021-07-11 ENCOUNTER — Other Ambulatory Visit: Payer: Self-pay

## 2021-07-11 ENCOUNTER — Inpatient Hospital Stay: Payer: Medicare HMO

## 2021-07-11 VITALS — BP 151/82 | HR 93 | Temp 98.5°F | Resp 16

## 2021-07-11 DIAGNOSIS — F17210 Nicotine dependence, cigarettes, uncomplicated: Secondary | ICD-10-CM | POA: Diagnosis not present

## 2021-07-11 DIAGNOSIS — C61 Malignant neoplasm of prostate: Secondary | ICD-10-CM

## 2021-07-11 DIAGNOSIS — C772 Secondary and unspecified malignant neoplasm of intra-abdominal lymph nodes: Secondary | ICD-10-CM

## 2021-07-11 DIAGNOSIS — G40909 Epilepsy, unspecified, not intractable, without status epilepticus: Secondary | ICD-10-CM | POA: Diagnosis not present

## 2021-07-11 DIAGNOSIS — I10 Essential (primary) hypertension: Secondary | ICD-10-CM | POA: Diagnosis not present

## 2021-07-11 DIAGNOSIS — R350 Frequency of micturition: Secondary | ICD-10-CM | POA: Diagnosis not present

## 2021-07-11 DIAGNOSIS — Z791 Long term (current) use of non-steroidal anti-inflammatories (NSAID): Secondary | ICD-10-CM | POA: Diagnosis not present

## 2021-07-11 DIAGNOSIS — Z79899 Other long term (current) drug therapy: Secondary | ICD-10-CM | POA: Diagnosis not present

## 2021-07-11 MED ORDER — LEUPROLIDE ACETATE (3 MONTH) 22.5 MG ~~LOC~~ KIT
22.5000 mg | PACK | Freq: Once | SUBCUTANEOUS | Status: AC
  Administered 2021-07-11: 22.5 mg via SUBCUTANEOUS
  Filled 2021-07-11: qty 22.5

## 2021-07-14 ENCOUNTER — Other Ambulatory Visit (HOSPITAL_COMMUNITY): Payer: Self-pay

## 2021-07-15 ENCOUNTER — Other Ambulatory Visit (HOSPITAL_COMMUNITY): Payer: Self-pay

## 2021-07-15 DIAGNOSIS — M5416 Radiculopathy, lumbar region: Secondary | ICD-10-CM | POA: Diagnosis not present

## 2021-07-17 ENCOUNTER — Other Ambulatory Visit (HOSPITAL_COMMUNITY): Payer: Self-pay

## 2021-07-17 ENCOUNTER — Other Ambulatory Visit: Payer: Self-pay | Admitting: Hematology and Oncology

## 2021-07-17 MED ORDER — ABIRATERONE ACETATE 250 MG PO TABS
1000.0000 mg | ORAL_TABLET | Freq: Every day | ORAL | 0 refills | Status: DC
  Filled 2021-07-17: qty 120, 30d supply, fill #0

## 2021-07-18 ENCOUNTER — Other Ambulatory Visit (HOSPITAL_COMMUNITY): Payer: Self-pay

## 2021-07-21 DIAGNOSIS — G40909 Epilepsy, unspecified, not intractable, without status epilepticus: Secondary | ICD-10-CM | POA: Diagnosis not present

## 2021-07-21 DIAGNOSIS — I10 Essential (primary) hypertension: Secondary | ICD-10-CM | POA: Diagnosis not present

## 2021-07-21 DIAGNOSIS — M130 Polyarthritis, unspecified: Secondary | ICD-10-CM | POA: Diagnosis not present

## 2021-07-21 DIAGNOSIS — E7849 Other hyperlipidemia: Secondary | ICD-10-CM | POA: Diagnosis not present

## 2021-07-23 ENCOUNTER — Inpatient Hospital Stay: Payer: Medicare HMO

## 2021-07-23 ENCOUNTER — Other Ambulatory Visit: Payer: Self-pay

## 2021-07-23 ENCOUNTER — Other Ambulatory Visit (HOSPITAL_COMMUNITY): Payer: Self-pay

## 2021-07-23 ENCOUNTER — Inpatient Hospital Stay: Payer: Medicare HMO | Attending: Hematology and Oncology | Admitting: Hematology and Oncology

## 2021-07-23 VITALS — BP 180/96 | HR 78 | Temp 97.5°F | Resp 17 | Wt 155.3 lb

## 2021-07-23 DIAGNOSIS — C772 Secondary and unspecified malignant neoplasm of intra-abdominal lymph nodes: Secondary | ICD-10-CM

## 2021-07-23 DIAGNOSIS — M129 Arthropathy, unspecified: Secondary | ICD-10-CM | POA: Insufficient documentation

## 2021-07-23 DIAGNOSIS — Z79899 Other long term (current) drug therapy: Secondary | ICD-10-CM | POA: Insufficient documentation

## 2021-07-23 DIAGNOSIS — C61 Malignant neoplasm of prostate: Secondary | ICD-10-CM

## 2021-07-23 DIAGNOSIS — I10 Essential (primary) hypertension: Secondary | ICD-10-CM | POA: Insufficient documentation

## 2021-07-23 DIAGNOSIS — F17210 Nicotine dependence, cigarettes, uncomplicated: Secondary | ICD-10-CM | POA: Diagnosis not present

## 2021-07-23 LAB — CBC WITH DIFFERENTIAL/PLATELET
Abs Immature Granulocytes: 0.05 10*3/uL (ref 0.00–0.07)
Basophils Absolute: 0.1 10*3/uL (ref 0.0–0.1)
Basophils Relative: 1 %
Eosinophils Absolute: 0.3 10*3/uL (ref 0.0–0.5)
Eosinophils Relative: 3 %
HCT: 29 % — ABNORMAL LOW (ref 39.0–52.0)
Hemoglobin: 9.7 g/dL — ABNORMAL LOW (ref 13.0–17.0)
Immature Granulocytes: 1 %
Lymphocytes Relative: 18 %
Lymphs Abs: 1.4 10*3/uL (ref 0.7–4.0)
MCH: 29.5 pg (ref 26.0–34.0)
MCHC: 33.4 g/dL (ref 30.0–36.0)
MCV: 88.1 fL (ref 80.0–100.0)
Monocytes Absolute: 0.4 10*3/uL (ref 0.1–1.0)
Monocytes Relative: 5 %
Neutro Abs: 5.9 10*3/uL (ref 1.7–7.7)
Neutrophils Relative %: 72 %
Platelets: 301 10*3/uL (ref 150–400)
RBC: 3.29 MIL/uL — ABNORMAL LOW (ref 4.22–5.81)
RDW: 13.3 % (ref 11.5–15.5)
WBC: 8.1 10*3/uL (ref 4.0–10.5)
nRBC: 0 % (ref 0.0–0.2)

## 2021-07-23 LAB — COMPREHENSIVE METABOLIC PANEL
ALT: 5 U/L (ref 0–44)
AST: 13 U/L — ABNORMAL LOW (ref 15–41)
Albumin: 3.7 g/dL (ref 3.5–5.0)
Alkaline Phosphatase: 67 U/L (ref 38–126)
Anion gap: 10 (ref 5–15)
BUN: 13 mg/dL (ref 8–23)
CO2: 24 mmol/L (ref 22–32)
Calcium: 8.7 mg/dL — ABNORMAL LOW (ref 8.9–10.3)
Chloride: 106 mmol/L (ref 98–111)
Creatinine, Ser: 1.09 mg/dL (ref 0.61–1.24)
GFR, Estimated: >60 mL/min (ref >60)
Glucose, Bld: 124 mg/dL — ABNORMAL HIGH (ref 70–99)
Potassium: 3.3 mmol/L — ABNORMAL LOW (ref 3.5–5.1)
Sodium: 140 mmol/L (ref 135–145)
Total Bilirubin: 0.2 mg/dL — ABNORMAL LOW (ref 0.3–1.2)
Total Protein: 7.3 g/dL (ref 6.5–8.1)

## 2021-07-23 MED ORDER — PREDNISONE 5 MG PO TABS
ORAL_TABLET | Freq: Every day | ORAL | 3 refills | Status: DC
  Filled 2021-07-23 – 2021-08-09 (×2): qty 90, 90d supply, fill #0

## 2021-07-23 NOTE — Progress Notes (Signed)
[place] [phone]   [phone]  PROGRESS NOTE  Patient Care Team: Iona Beard, MD as PCP - General (Family Medicine)  Hematological/Oncological History # Metastatic Prostate Cancer w/ Spread to Intraabdominal Lymph Nodes --previously followed by Dr. Chryl Heck. Currently on abiraterone therapy 06/18/2021: PSA <0.01 07/23/2021: establish care with Dr. Lorenso Courier   Interval History:  Joseph Rangel 75 y.o. male with medical history significant for metastatic prostate cancer who presents for a follow up visit. The patient's last visit was on 06/25/2021. In the interim since the last visit he has continued on abiraterone therapy.   On exam today Mr. [name] reports he has been taking his Zytiga pills as prescribed.  He notes he is taking the prednisone and is almost out of the medication.  He unfortunately had a fall this morning where his left knee gave out.  He tried to get in the shower and according to his wife he "put a hole in the door".  He notes that he hit with his shoulder and did not hit his head.  He notes he has longstanding issues with his sciatic nerve.  He reports his energy levels are okay and he is doing his best to eat well.  He otherwise denies any fevers, chills, sweats, nausea, vomiting or diarrhea.  Full 10 point ROS is listed below.  MEDICAL HISTORY:  Past Medical History:  Diagnosis Date   Arthritis    Cancer [facility])    prostate    Frequency of urination    Hypertension    Seizures ([place])    after brain surg - none in past 20 yrs   Speech disorder    due to brain surg    SURGICAL HISTORY: Past Surgical History:  Procedure Laterality Date   BRAIN SURGERY     BLOOD CLOT REMOVED 1980   MASS EXCISION  02/24/2012   Procedure: MINOR EXCISION OF MASS;  Surgeon: Bernestine Amass, MD;  Location: [facility];  Service: Urology;;  excision of neck lesion   ROBOT ASSISTED LAPAROSCOPIC RADICAL PROSTATECTOMY  02/24/2012   Procedure: ROBOTIC ASSISTED  LAPAROSCOPIC RADICAL PROSTATECTOMY;  Surgeon: Bernestine Amass, MD;  Location: [facility];  Service: Urology;  Laterality: N/A;           SOCIAL HISTORY: Social History   Socioeconomic History   Marital status: Married    Spouse name: Not on file   Number of children: Not on file   Years of education: Not on file   Highest education level: Not on file  Occupational History   Not on file  Tobacco Use   Smoking status: Every Day    Types: Cigarettes   Smokeless tobacco: Never   Tobacco comments:    2 cigarettes per day  Vaping Use   Vaping Use: Never used  Substance and Sexual Activity   Alcohol use: No   Drug use: No   Sexual activity: Not on file  Other Topics Concern   Not on file  Social History Narrative   Not on file   Social Determinants of Health   Financial Resource Strain: Not on file  Food Insecurity: Not on file  Transportation Needs: Not on file  Physical Activity: Not on file  Stress: Not on file  Social Connections: Not on file  Intimate Partner Violence: Not on file    FAMILY HISTORY: No family history on file.  ALLERGIES:  has No Known Allergies.  MEDICATIONS:  Current Outpatient Medications  Medication Sig Dispense Refill  omeprazole (PRILOSEC) 40 MG capsule Take 40 mg by mouth daily.     abiraterone acetate (ZYTIGA) 250 MG tablet TAKE 4 TABLETS (1,000 MG TOTAL) BY MOUTH DAILY. TAKE ON AN EMPTY STOMACH 1 HOUR BEFORE OR 2 HOURS AFTER A MEAL 120 tablet 0   atenolol (TENORMIN) 25 MG tablet Take 25 mg by mouth daily.     cilostazol (PLETAL) 50 MG tablet Take 50 mg by mouth daily.     fenofibrate (TRICOR) 48 MG tablet Take 48 mg by mouth daily.     gabapentin (NEURONTIN) 100 MG capsule Take by mouth.     hydrochlorothiazide (HYDRODIURIL) 25 MG tablet Take 25 mg by mouth daily.     HYDROcodone-acetaminophen (NORCO) 10-325 MG tablet Take 1 tablet by mouth every 4 (four) hours as needed for moderate pain.     latanoprost (XALATAN) 0.005 % ophthalmic  solution Place 1 drop into both eyes every other day. At night     meloxicam (MOBIC) 15 MG tablet Take 15 mg by mouth daily.     olmesartan (BENICAR) 40 MG tablet Take 40 mg by mouth daily.     phenytoin (DILANTIN) 100 MG ER capsule Take 300 mg by mouth daily.     potassium chloride SA (KLOR-CON) 20 MEQ tablet Take 2 tablets (40 mEq total) by mouth daily for 5 days, THEN 1 tablet (20 mEq total) daily. 102 tablet 0   predniSONE (DELTASONE) 5 MG tablet TAKE 1 TABLET BY MOUTH ONCE A DAY WITH BREAKFAST 90 tablet 3   verapamil (VERELAN PM) 360 MG 24 hr capsule Take 360 mg by mouth daily.     No current facility-administered medications for this visit.    REVIEW OF SYSTEMS:   Constitutional: ( - ) fevers, ( - )  chills , ( - ) night sweats Eyes: ( - ) blurriness of vision, ( - ) double vision, ( - ) watery eyes Ears, nose, mouth, throat, and face: ( - ) mucositis, ( - ) sore throat Respiratory: ( - ) cough, ( - ) dyspnea, ( - ) wheezes Cardiovascular: ( - ) palpitation, ( - ) chest discomfort, ( - ) lower extremity swelling Gastrointestinal:  ( - ) nausea, ( - ) heartburn, ( - ) change in bowel habits Skin: ( - ) abnormal skin rashes Lymphatics: ( - ) new lymphadenopathy, ( - ) easy bruising Neurological: ( - ) numbness, ( - ) tingling, ( - ) new weaknesses Behavioral/Psych: ( - ) mood change, ( - ) new changes  All other systems were reviewed with the patient and are negative.  PHYSICAL EXAMINATION: ECOG PERFORMANCE STATUS: 2 - Symptomatic, <50% confined to bed  Vitals:   07/23/21 1436  BP: (!) 180/96  Pulse: 78  Resp: 17  Temp: (!) 97.5 F (36.4 C)  SpO2: 100%   Filed Weights   07/23/21 1436  Weight: 155 lb 4.8 oz (70.4 kg)    GENERAL: Chronically ill-appearing elderly African-American male, alert, no distress and comfortable SKIN: skin color, texture, turgor are normal, no rashes or significant lesions EYES: conjunctiva are pink and non-injected, sclera clear LUNGS: clear to  auscultation and percussion with normal breathing effort HEART: regular rate & rhythm and no murmurs and no lower extremity edema ABDOMEN: soft, non-tender, non-distended, normal bowel sounds PSYCH: alert & oriented x 3, fluent speech NEURO: no focal motor/sensory deficits  LABORATORY DATA:  I have reviewed the data as listed CBC Latest Ref Rng & Units 07/23/2021 06/18/2021 03/06/2021  WBC  4.0 - 10.5 K/uL 8.1 7.6 10.7(H)  Hemoglobin 13.0 - 17.0 g/dL 9.7(L) 9.9(L) 9.9(L)  Hematocrit 39.0 - 52.0 % 29.0(L) 30.1(L) 28.9(L)  Platelets 150 - 400 K/uL 301 283 287    CMP Latest Ref Rng & Units 07/23/2021 06/18/2021 03/06/2021  Glucose 70 - 99 mg/dL 124(H) 158(H) 120(H)  BUN 8 - 23 mg/dL 13 13 13   Creatinine 0.61 - 1.24 mg/dL 1.09 1.01 1.07  Sodium 135 - 145 mmol/L 140 141 142  Potassium 3.5 - 5.1 mmol/L 3.3(L) 4.1 2.6(LL)  Chloride 98 - 111 mmol/L 106 107 99  CO2 22 - 32 mmol/L 24 21(L) 30  Calcium 8.9 - 10.3 mg/dL 8.7(L) 9.2 9.3  Total Protein 6.5 - 8.1 g/dL 7.3 7.4 -  Total Bilirubin 0.3 - 1.2 mg/dL 0.2(L) <0.2(L) -  Alkaline Phos 38 - 126 U/L 67 84 -  AST 15 - 41 U/L 13(L) 11(L) -  ALT 0 - 44 U/L 5 <6 -   RADIOGRAPHIC STUDIES: No results found.  ASSESSMENT & PLAN Joseph Rangel 75 y.o. male with medical history significant for metastatic prostate cancer who presents for a follow up visit.  PMH significant for prostate adenocarcinoma Gleason 3+4, no LN involvement s.p robotic prostatectomy in 2013, presented with peri aortic left iliac and peri rectal LN, with biopsy confirming metastatic prostate adenoca now on zytiga and lupron presents here for FU on his metastatic prostate cancer. Baseline PSA of 207.  # Metastatic Prostate Cancer -- continue on Zytiga 1000mg  PO daily with prednisone -- last lupron on 07/11/2021. Next due in Jan 2023 -- last PSA on 06/18/2021 showed PSA <0.01  --will order testosterone for next visit in 4 weeks time --RTC in 4 weeks for continued monitoring on  therapy.   Orders Placed This Encounter  Procedures   CT CHEST ABDOMEN PELVIS W CONTRAST    Standing Status:   Future    Standing Expiration Date:   07/23/2022    Order Specific Question:   Preferred imaging location?    Answer:   [facility]    Order Specific Question:   Is Oral Contrast requested for this exam?    Answer:   Yes, Per Radiology protocol    Order Specific Question:   Reason for Exam (SYMPTOM  OR DIAGNOSIS REQUIRED)    Answer:   metastatic prostate cancer, assess treatment response    All questions were answered. The patient knows to call the clinic with any problems, questions or concerns.  A total of more than 30 minutes were spent on this encounter with face-to-face time and non-face-to-face time, including preparing to see the patient, ordering tests and/or medications, counseling the patient and coordination of care as outlined above.   Ledell Peoples, MD Department of Hematology/Oncology [place] [facility] Phone: [phone] Pager: [phone] Email: [email or web address]  07/23/2021 4:54 PM

## 2021-07-29 ENCOUNTER — Telehealth: Payer: Self-pay

## 2021-07-29 ENCOUNTER — Other Ambulatory Visit: Payer: Self-pay

## 2021-07-29 ENCOUNTER — Ambulatory Visit (HOSPITAL_COMMUNITY)
Admission: RE | Admit: 2021-07-29 | Discharge: 2021-07-29 | Disposition: A | Discharge status: Home / Self Care | Payer: Medicare HMO | Source: Ambulatory Visit | Attending: Hematology and Oncology | Admitting: Hematology and Oncology

## 2021-07-29 DIAGNOSIS — C772 Secondary and unspecified malignant neoplasm of intra-abdominal lymph nodes: Secondary | ICD-10-CM

## 2021-07-29 MED ORDER — ABIRATERONE ACETATE 250 MG PO TABS: 1000.0000 mg | ORAL_TABLET | Freq: Every day | ORAL | 0 refills | Status: DC

## 2021-07-29 NOTE — Telephone Encounter (Signed)
T/C from [place], Occupational psychologist at [employer] stating Joseph Rangel requested his rx for Zytiga 250 mg be changed from [place] and resent  due to his [employer] part D coverage.  Rx resent.

## 2021-07-31 ENCOUNTER — Telehealth: Payer: Self-pay | Admitting: *Deleted

## 2021-07-31 NOTE — Telephone Encounter (Signed)
Received call from [place] @ [facility].She states that the prescription for Joseph Rangel has been rejected due to the interaction with pt's Phenytoion.  Please advise.

## 2021-08-04 ENCOUNTER — Telehealth: Payer: Self-pay | Admitting: *Deleted

## 2021-08-05 ENCOUNTER — Encounter: Payer: Self-pay | Admitting: Hematology and Oncology

## 2021-08-06 NOTE — Telephone Encounter (Signed)
Received second call from [place] regarding Zytiga and Phenytoin. Pharmacist says Phenytoin decreases plasma concentration of Zytiga.  He said he needed it 2 x a day but could not give me diose recommendations

## 2021-08-08 ENCOUNTER — Other Ambulatory Visit (HOSPITAL_COMMUNITY): Payer: Self-pay

## 2021-08-09 ENCOUNTER — Other Ambulatory Visit (HOSPITAL_COMMUNITY): Payer: Self-pay

## 2021-08-11 ENCOUNTER — Other Ambulatory Visit (HOSPITAL_COMMUNITY): Payer: Self-pay

## 2021-08-12 ENCOUNTER — Other Ambulatory Visit (HOSPITAL_COMMUNITY): Payer: Self-pay

## 2021-08-13 DIAGNOSIS — R030 Elevated blood-pressure reading, without diagnosis of hypertension: Secondary | ICD-10-CM | POA: Diagnosis not present

## 2021-08-13 DIAGNOSIS — Z79899 Other long term (current) drug therapy: Secondary | ICD-10-CM | POA: Diagnosis not present

## 2021-08-13 DIAGNOSIS — I739 Peripheral vascular disease, unspecified: Secondary | ICD-10-CM | POA: Diagnosis not present

## 2021-08-13 DIAGNOSIS — F17210 Nicotine dependence, cigarettes, uncomplicated: Secondary | ICD-10-CM | POA: Diagnosis not present

## 2021-08-13 DIAGNOSIS — M5136 Other intervertebral disc degeneration, lumbar region: Secondary | ICD-10-CM | POA: Diagnosis not present

## 2021-08-18 ENCOUNTER — Telehealth: Payer: Self-pay | Admitting: *Deleted

## 2021-08-18 NOTE — Telephone Encounter (Signed)
TCT patient regarding his specialty pharmacy Zytiga. The specialty pharmacy [facility] has been trying to reach him to schedule delivery of his Zytiga and have not been successful in reaching him. Spoke with pt and advised to answer the call from the pharmacy 800 # so the zytiga can be delivered.  He states he will.  Asked pt if he was aware of his CT scan scheduled for tomorrow. He states he did not know. He states he cannot make it tomorrow and wants it re-scheduled. He is aware of his appt on 08/20/21 with Dr. Lorenso Courier.  Advised that I would have [facility] scheduling call him for CT scan.  He voiced understanding. Call made to [facility]. Scheduling-they will reach out to pt to r/s his scan

## 2021-08-19 ENCOUNTER — Ambulatory Visit (HOSPITAL_COMMUNITY): Payer: Medicare HMO

## 2021-08-20 ENCOUNTER — Other Ambulatory Visit: Payer: Self-pay

## 2021-08-20 ENCOUNTER — Telehealth: Payer: Self-pay

## 2021-08-20 ENCOUNTER — Other Ambulatory Visit: Payer: Self-pay | Admitting: *Deleted

## 2021-08-20 ENCOUNTER — Inpatient Hospital Stay: Payer: Medicare HMO | Admitting: Hematology and Oncology

## 2021-08-20 ENCOUNTER — Inpatient Hospital Stay: Payer: Medicare HMO

## 2021-08-20 VITALS — BP 166/90 | HR 99 | Temp 98.7°F | Resp 17

## 2021-08-20 DIAGNOSIS — C772 Secondary and unspecified malignant neoplasm of intra-abdominal lymph nodes: Secondary | ICD-10-CM | POA: Diagnosis not present

## 2021-08-20 DIAGNOSIS — F17210 Nicotine dependence, cigarettes, uncomplicated: Secondary | ICD-10-CM | POA: Diagnosis not present

## 2021-08-20 DIAGNOSIS — C61 Malignant neoplasm of prostate: Secondary | ICD-10-CM | POA: Diagnosis not present

## 2021-08-20 DIAGNOSIS — E7849 Other hyperlipidemia: Secondary | ICD-10-CM | POA: Diagnosis not present

## 2021-08-20 DIAGNOSIS — Z79899 Other long term (current) drug therapy: Secondary | ICD-10-CM | POA: Diagnosis not present

## 2021-08-20 DIAGNOSIS — M129 Arthropathy, unspecified: Secondary | ICD-10-CM | POA: Diagnosis not present

## 2021-08-20 DIAGNOSIS — R590 Localized enlarged lymph nodes: Secondary | ICD-10-CM | POA: Diagnosis not present

## 2021-08-20 DIAGNOSIS — E876 Hypokalemia: Secondary | ICD-10-CM | POA: Diagnosis not present

## 2021-08-20 DIAGNOSIS — R7989 Other specified abnormal findings of blood chemistry: Secondary | ICD-10-CM | POA: Diagnosis not present

## 2021-08-20 DIAGNOSIS — G40909 Epilepsy, unspecified, not intractable, without status epilepticus: Secondary | ICD-10-CM | POA: Diagnosis not present

## 2021-08-20 DIAGNOSIS — I10 Essential (primary) hypertension: Secondary | ICD-10-CM | POA: Diagnosis not present

## 2021-08-20 DIAGNOSIS — M130 Polyarthritis, unspecified: Secondary | ICD-10-CM | POA: Diagnosis not present

## 2021-08-20 LAB — CBC WITH DIFFERENTIAL/PLATELET
Abs Immature Granulocytes: 0.05 10*3/uL (ref 0.00–0.07)
Basophils Absolute: 0 10*3/uL (ref 0.0–0.1)
Basophils Relative: 0 %
Eosinophils Absolute: 0.1 10*3/uL (ref 0.0–0.5)
Eosinophils Relative: 1 %
HCT: 25.4 % — ABNORMAL LOW (ref 39.0–52.0)
Hemoglobin: 8.3 g/dL — ABNORMAL LOW (ref 13.0–17.0)
Immature Granulocytes: 1 %
Lymphocytes Relative: 17 %
Lymphs Abs: 1.3 10*3/uL (ref 0.7–4.0)
MCH: 28.6 pg (ref 26.0–34.0)
MCHC: 32.7 g/dL (ref 30.0–36.0)
MCV: 87.6 fL (ref 80.0–100.0)
Monocytes Absolute: 0.4 10*3/uL (ref 0.1–1.0)
Monocytes Relative: 5 %
Neutro Abs: 5.8 10*3/uL (ref 1.7–7.7)
Neutrophils Relative %: 76 %
Platelets: 308 10*3/uL (ref 150–400)
RBC: 2.9 MIL/uL — ABNORMAL LOW (ref 4.22–5.81)
RDW: 13.5 % (ref 11.5–15.5)
WBC: 7.7 10*3/uL (ref 4.0–10.5)
nRBC: 0 % (ref 0.0–0.2)

## 2021-08-20 LAB — COMPREHENSIVE METABOLIC PANEL
ALT: <5 U/L (ref 0–44)
AST: 11 U/L — ABNORMAL LOW (ref 15–41)
Albumin: 3.5 g/dL (ref 3.5–5.0)
Alkaline Phosphatase: 69 U/L (ref 38–126)
Anion gap: 11 (ref 5–15)
BUN: 11 mg/dL (ref 8–23)
CO2: 27 mmol/L (ref 22–32)
Calcium: 8.6 mg/dL — ABNORMAL LOW (ref 8.9–10.3)
Chloride: 104 mmol/L (ref 98–111)
Creatinine, Ser: 0.99 mg/dL (ref 0.61–1.24)
GFR, Estimated: >60 mL/min (ref >60)
Glucose, Bld: 129 mg/dL — ABNORMAL HIGH (ref 70–99)
Potassium: 2.7 mmol/L — CL (ref 3.5–5.1)
Sodium: 142 mmol/L (ref 135–145)
Total Bilirubin: 0.2 mg/dL — ABNORMAL LOW (ref 0.3–1.2)
Total Protein: 7 g/dL (ref 6.5–8.1)

## 2021-08-20 MED ORDER — POTASSIUM CHLORIDE CRYS ER 20 MEQ PO TBCR: 20.0000 meq | EXTENDED_RELEASE_TABLET | Freq: Once | ORAL | 1 refills | Status: DC

## 2021-08-20 NOTE — Telephone Encounter (Signed)
CRITICAL VALUE STICKER  CRITICAL VALUE: K = 2.7  RECEIVER (on-site recipient of call): Yetta Glassman, [place] NOTIFIED: 08/20/21 at 3:29pm  MESSENGER (representative from lab): Ulice Dash  MD NOTIFIED: Lorenso Courier  TIME OF NOTIFICATION: 08/20/21 at 3:33pm  RESPONSE: Notification given to Joesphine Bare, RN for follow-up with pt and provider.

## 2021-08-26 ENCOUNTER — Encounter: Payer: Self-pay | Admitting: Hematology and Oncology

## 2021-08-26 NOTE — Progress Notes (Signed)
Joseph Rangel [phone]   [phone]  PROGRESS NOTE  Patient Care Team: Iona Beard, MD as PCP - General (Family Medicine)  Hematological/Oncological History # Metastatic Prostate Cancer w/ Spread to Intraabdominal Lymph Nodes --previously followed by Dr. Chryl Heck. Currently on abiraterone therapy 06/18/2021: PSA <0.01 07/23/2021: establish care with Dr. Lorenso Courier   Interval History:  Joseph Rangel 75 y.o. male with medical history significant for metastatic prostate cancer who presents for a follow up visit. The patient's last visit was on 07/23/2021. In the interim since the last visit he has continued on abiraterone therapy.   On exam today Joseph Rangel reports he has continued taking his Zytiga pills as prescribed.  He notes no side effects as result of this medication and reports it is overall well-tolerated.  He notes no major changes in his health in the interim since her last visit.  He denies any hot flashes, sweats, or drop in energy levels.  He reports no shortness of breath or chest pain.  He also denies any muscle cramping or urinary symptoms.  He otherwise denies any fevers, chills, sweats, nausea, vomiting or diarrhea.  Full 10 point ROS is listed below.  MEDICAL HISTORY:  Past Medical History:  Diagnosis Date   Arthritis    Cancer [facility])    prostate    Frequency of urination    Hypertension    Seizures ([place])    after brain surg - none in past 20 yrs   Speech disorder    due to brain surg    SURGICAL HISTORY: Past Surgical History:  Procedure Laterality Date   BRAIN SURGERY     BLOOD CLOT REMOVED 1980   MASS EXCISION  02/24/2012   Procedure: MINOR EXCISION OF MASS;  Surgeon: Bernestine Amass, MD;  Location: [facility];  Service: Urology;;  excision of neck lesion   ROBOT ASSISTED LAPAROSCOPIC RADICAL PROSTATECTOMY  02/24/2012   Procedure: ROBOTIC ASSISTED LAPAROSCOPIC RADICAL PROSTATECTOMY;  Surgeon: Bernestine Amass, MD;  Location: [facility];   Service: Urology;  Laterality: N/A;           SOCIAL HISTORY: Social History   Socioeconomic History   Marital status: Married    Spouse name: Not on file   Number of children: Not on file   Years of education: Not on file   Highest education level: Not on file  Occupational History   Not on file  Tobacco Use   Smoking status: Every Day    Types: Cigarettes   Smokeless tobacco: Never   Tobacco comments:    2 cigarettes per day  Vaping Use   Vaping Use: Never used  Substance and Sexual Activity   Alcohol use: No   Drug use: No   Sexual activity: Not on file  Other Topics Concern   Not on file  Social History Narrative   Not on file   Social Determinants of Health   Financial Resource Strain: Not on file  Food Insecurity: Not on file  Transportation Needs: Not on file  Physical Activity: Not on file  Stress: Not on file  Social Connections: Not on file  Intimate Partner Violence: Not on file    FAMILY HISTORY: No family history on file.  ALLERGIES:  has No Known Allergies.  MEDICATIONS:  Current Outpatient Medications  Medication Sig Dispense Refill   abiraterone acetate (ZYTIGA) 250 MG tablet TAKE 4 TABLETS (1,000 MG TOTAL) BY MOUTH DAILY. TAKE ON AN EMPTY STOMACH 1 HOUR BEFORE OR 2  HOURS AFTER A MEAL 120 tablet 0   atenolol (TENORMIN) 25 MG tablet Take 25 mg by mouth daily.     buprenorphine (BUTRANS) 7.5 MCG/HR  (Patient not taking: Reported on 08/20/2021)     cilostazol (PLETAL) 50 MG tablet Take 50 mg by mouth daily.     fenofibrate (TRICOR) 48 MG tablet Take 48 mg by mouth daily.     gabapentin (NEURONTIN) 100 MG capsule Take by mouth.     hydrochlorothiazide (HYDRODIURIL) 25 MG tablet Take 25 mg by mouth daily.     HYDROcodone-acetaminophen (NORCO) 10-325 MG tablet Take 1 tablet by mouth every 4 (four) hours as needed for moderate pain.     latanoprost (XALATAN) 0.005 % ophthalmic solution Place 1 drop into both eyes every other day. At night      meloxicam (MOBIC) 15 MG tablet Take 15 mg by mouth daily.     olmesartan (BENICAR) 40 MG tablet Take 40 mg by mouth daily.     omeprazole (PRILOSEC) 40 MG capsule Take 40 mg by mouth daily.     phenytoin (DILANTIN) 100 MG ER capsule Take 300 mg by mouth daily.     potassium chloride SA (KLOR-CON M) 20 MEQ tablet Take 1 tablet (20 mEq total) by mouth once for 1 dose. 30 tablet 1   predniSONE (DELTASONE) 5 MG tablet TAKE 1 TABLET BY MOUTH ONCE A DAY WITH BREAKFAST 90 tablet 3   verapamil (VERELAN PM) 360 MG 24 hr capsule Take 360 mg by mouth daily.     No current facility-administered medications for this visit.    REVIEW OF SYSTEMS:   Constitutional: ( - ) fevers, ( - )  chills , ( - ) night sweats Eyes: ( - ) blurriness of vision, ( - ) double vision, ( - ) watery eyes Ears, nose, mouth, throat, and face: ( - ) mucositis, ( - ) sore throat Respiratory: ( - ) cough, ( - ) dyspnea, ( - ) wheezes Cardiovascular: ( - ) palpitation, ( - ) chest discomfort, ( - ) lower extremity swelling Gastrointestinal:  ( - ) nausea, ( - ) heartburn, ( - ) change in bowel habits Skin: ( - ) abnormal skin rashes Lymphatics: ( - ) new lymphadenopathy, ( - ) easy bruising Neurological: ( - ) numbness, ( - ) tingling, ( - ) new weaknesses Behavioral/Psych: ( - ) mood change, ( - ) new changes  All other systems were reviewed with the patient and are negative.  PHYSICAL EXAMINATION: ECOG PERFORMANCE STATUS: 2 - Symptomatic, <50% confined to bed  Vitals:   08/20/21 1505  BP: (!) 166/90  Pulse: 99  Resp: 17  Temp: 98.7 F (37.1 C)  SpO2: 100%   There were no vitals filed for this visit.   GENERAL: Chronically ill-appearing elderly African-American male, alert, no distress and comfortable SKIN: skin color, texture, turgor are normal, no rashes or significant lesions EYES: conjunctiva are pink and non-injected, sclera clear LUNGS: clear to auscultation and percussion with normal breathing effort HEART:  regular rate & rhythm and no murmurs and no lower extremity edema ABDOMEN: soft, non-tender, non-distended, normal bowel sounds PSYCH: alert & oriented x 3, fluent speech NEURO: no focal motor/sensory deficits  LABORATORY DATA:  I have reviewed the data as listed CBC Latest Ref Rng & Units 08/20/2021 07/23/2021 06/18/2021  WBC 4.0 - 10.5 K/uL 7.7 8.1 7.6  Hemoglobin 13.0 - 17.0 g/dL 8.3(L) 9.7(L) 9.9(L)  Hematocrit 39.0 - 52.0 % 25.4(L) 29.0(L)  30.1(L)  Platelets 150 - 400 K/uL 308 301 283    CMP Latest Ref Rng & Units 08/20/2021 07/23/2021 06/18/2021  Glucose 70 - 99 mg/dL 129(H) 124(H) 158(H)  BUN 8 - 23 mg/dL 11 13 13   Creatinine 0.61 - 1.24 mg/dL 0.99 1.09 1.01  Sodium 135 - 145 mmol/L 142 140 141  Potassium 3.5 - 5.1 mmol/L 2.7(LL) 3.3(L) 4.1  Chloride 98 - 111 mmol/L 104 106 107  CO2 22 - 32 mmol/L 27 24 21(L)  Calcium 8.9 - 10.3 mg/dL 8.6(L) 8.7(L) 9.2  Total Protein 6.5 - 8.1 g/dL 7.0 7.3 7.4  Total Bilirubin 0.3 - 1.2 mg/dL 0.2(L) 0.2(L) <0.2(L)  Alkaline Phos 38 - 126 U/L 69 67 84  AST 15 - 41 U/L 11(L) 13(L) 11(L)  ALT 0 - 44 U/L <5 5 <6   RADIOGRAPHIC STUDIES: No results found.  ASSESSMENT & PLAN Joseph Rangel 75 y.o. male with medical history significant for metastatic prostate cancer who presents for a follow up visit.  PMH significant for prostate adenocarcinoma Gleason 3+4, no LN involvement s.p robotic prostatectomy in 2013, presented with peri aortic left iliac and peri rectal LN, with biopsy confirming metastatic prostate adenoca now on zytiga and lupron presents here for FU on his metastatic prostate cancer. Baseline PSA of 207.  # Metastatic Castrate Sensitive Prostate Cancer -- continue on Zytiga 1000mg  PO daily with prednisone -- last lupron on 07/11/2021. Next due in Jan 2023 -- last PSA on 06/18/2021 showed PSA <0.01  --will order testosterone for next visit in 4 weeks time --RTC in 4 weeks for continued monitoring on therapy.   No orders of the defined  types were placed in this encounter.   All questions were answered. The patient knows to call the clinic with any problems, questions or concerns.  A total of more than 30 minutes were spent on this encounter with face-to-face time and non-face-to-face time, including preparing to see the patient, ordering tests and/or medications, counseling the patient and coordination of care as outlined above.   Ledell Peoples, MD Department of Hematology/Oncology [place] [facility] Phone: [phone] Pager: [phone] Email: [email or web address]  08/26/2021 3:23 PM

## 2021-08-27 ENCOUNTER — Other Ambulatory Visit: Payer: Self-pay

## 2021-08-27 ENCOUNTER — Ambulatory Visit (HOSPITAL_COMMUNITY)
Admission: RE | Admit: 2021-08-27 | Discharge: 2021-08-27 | Disposition: A | Discharge status: Home / Self Care | Payer: Medicare HMO | Source: Ambulatory Visit | Attending: Hematology and Oncology | Admitting: Hematology and Oncology

## 2021-08-27 DIAGNOSIS — I2510 Atherosclerotic heart disease of native coronary artery without angina pectoris: Secondary | ICD-10-CM | POA: Diagnosis not present

## 2021-08-27 DIAGNOSIS — K3189 Other diseases of stomach and duodenum: Secondary | ICD-10-CM | POA: Diagnosis not present

## 2021-08-27 DIAGNOSIS — C772 Secondary and unspecified malignant neoplasm of intra-abdominal lymph nodes: Secondary | ICD-10-CM | POA: Diagnosis not present

## 2021-08-27 DIAGNOSIS — C61 Malignant neoplasm of prostate: Secondary | ICD-10-CM | POA: Insufficient documentation

## 2021-08-27 DIAGNOSIS — I318 Other specified diseases of pericardium: Secondary | ICD-10-CM | POA: Diagnosis not present

## 2021-08-27 DIAGNOSIS — M47816 Spondylosis without myelopathy or radiculopathy, lumbar region: Secondary | ICD-10-CM | POA: Diagnosis not present

## 2021-08-27 DIAGNOSIS — M160 Bilateral primary osteoarthritis of hip: Secondary | ICD-10-CM | POA: Diagnosis not present

## 2021-08-27 DIAGNOSIS — M47814 Spondylosis without myelopathy or radiculopathy, thoracic region: Secondary | ICD-10-CM | POA: Diagnosis not present

## 2021-08-27 DIAGNOSIS — K2970 Gastritis, unspecified, without bleeding: Secondary | ICD-10-CM | POA: Diagnosis not present

## 2021-08-27 MED ORDER — SODIUM CHLORIDE (PF) 0.9 % IJ SOLN
INTRAMUSCULAR | Status: AC
  Filled 2021-08-27: qty 50

## 2021-08-27 MED ORDER — IOHEXOL 350 MG/ML SOLN
80.0000 mL | Freq: Once | INTRAVENOUS | Status: AC | PRN
  Administered 2021-08-27: 80 mL via INTRAVENOUS

## 2021-08-29 ENCOUNTER — Telehealth: Payer: Self-pay | Admitting: *Deleted

## 2021-08-29 NOTE — Telephone Encounter (Signed)
-----   Message from Orson Slick, MD sent at 08/29/2021  8:51 AM EST ----- Please let Mr. Voris know that his CT scan showed excellent response to his prostate cancer therapy. The enlarged lymph nodes have decreased in size with no new areas of disease. We will see him back in late Dec 2022.  ----- Message ----- From: Buel Ream, Rad Results In Sent: 08/28/2021   8:54 PM EST To: Orson Slick, MD

## 2021-08-29 NOTE — Telephone Encounter (Signed)
TCT patient regarding recent CT scan. Spoke with pt and informed him that his recent scan looks good-his previously enlarged lymph nodes have decreased in size and there are no new areas of disease.  Pt very pleased with these results. Reminded him of his appt in December

## 2021-09-08 DIAGNOSIS — R569 Unspecified convulsions: Secondary | ICD-10-CM | POA: Diagnosis not present

## 2021-09-08 DIAGNOSIS — C61 Malignant neoplasm of prostate: Secondary | ICD-10-CM | POA: Diagnosis not present

## 2021-09-08 DIAGNOSIS — I739 Peripheral vascular disease, unspecified: Secondary | ICD-10-CM | POA: Diagnosis not present

## 2021-09-08 DIAGNOSIS — E785 Hyperlipidemia, unspecified: Secondary | ICD-10-CM | POA: Diagnosis not present

## 2021-09-08 DIAGNOSIS — I10 Essential (primary) hypertension: Secondary | ICD-10-CM | POA: Diagnosis not present

## 2021-09-08 DIAGNOSIS — M150 Primary generalized (osteo)arthritis: Secondary | ICD-10-CM | POA: Diagnosis not present

## 2021-09-08 DIAGNOSIS — Z720 Tobacco use: Secondary | ICD-10-CM | POA: Diagnosis not present

## 2021-09-17 ENCOUNTER — Inpatient Hospital Stay: Payer: Medicare HMO | Attending: Hematology and Oncology

## 2021-09-17 ENCOUNTER — Inpatient Hospital Stay: Payer: Medicare HMO | Admitting: Hematology and Oncology

## 2021-09-17 ENCOUNTER — Other Ambulatory Visit: Payer: Self-pay

## 2021-09-17 VITALS — BP 152/84 | HR 84 | Temp 97.9°F | Resp 18 | Wt 156.8 lb

## 2021-09-17 DIAGNOSIS — Z7952 Long term (current) use of systemic steroids: Secondary | ICD-10-CM | POA: Diagnosis not present

## 2021-09-17 DIAGNOSIS — Z79899 Other long term (current) drug therapy: Secondary | ICD-10-CM | POA: Insufficient documentation

## 2021-09-17 DIAGNOSIS — R7989 Other specified abnormal findings of blood chemistry: Secondary | ICD-10-CM

## 2021-09-17 DIAGNOSIS — N1831 Chronic kidney disease, stage 3a: Secondary | ICD-10-CM | POA: Diagnosis not present

## 2021-09-17 DIAGNOSIS — Z8546 Personal history of malignant neoplasm of prostate: Secondary | ICD-10-CM | POA: Diagnosis not present

## 2021-09-17 DIAGNOSIS — C61 Malignant neoplasm of prostate: Secondary | ICD-10-CM | POA: Diagnosis not present

## 2021-09-17 DIAGNOSIS — R590 Localized enlarged lymph nodes: Secondary | ICD-10-CM

## 2021-09-17 DIAGNOSIS — R030 Elevated blood-pressure reading, without diagnosis of hypertension: Secondary | ICD-10-CM | POA: Diagnosis not present

## 2021-09-17 DIAGNOSIS — C772 Secondary and unspecified malignant neoplasm of intra-abdominal lymph nodes: Secondary | ICD-10-CM | POA: Insufficient documentation

## 2021-09-17 DIAGNOSIS — I10 Essential (primary) hypertension: Secondary | ICD-10-CM | POA: Insufficient documentation

## 2021-09-17 DIAGNOSIS — Z9079 Acquired absence of other genital organ(s): Secondary | ICD-10-CM | POA: Diagnosis not present

## 2021-09-17 DIAGNOSIS — I739 Peripheral vascular disease, unspecified: Secondary | ICD-10-CM | POA: Diagnosis not present

## 2021-09-17 DIAGNOSIS — F17210 Nicotine dependence, cigarettes, uncomplicated: Secondary | ICD-10-CM | POA: Diagnosis not present

## 2021-09-17 DIAGNOSIS — M5136 Other intervertebral disc degeneration, lumbar region: Secondary | ICD-10-CM | POA: Diagnosis not present

## 2021-09-17 LAB — CMP (CANCER CENTER ONLY)
ALT: 8 U/L (ref 0–44)
AST: 12 U/L — ABNORMAL LOW (ref 15–41)
Albumin: 4.1 g/dL (ref 3.5–5.0)
Alkaline Phosphatase: 66 U/L (ref 38–126)
Anion gap: 8 (ref 5–15)
BUN: 14 mg/dL (ref 8–23)
CO2: 24 mmol/L (ref 22–32)
Calcium: 9.1 mg/dL (ref 8.9–10.3)
Chloride: 106 mmol/L (ref 98–111)
Creatinine: 0.88 mg/dL (ref 0.61–1.24)
GFR, Estimated: >60 mL/min (ref >60)
Glucose, Bld: 106 mg/dL — ABNORMAL HIGH (ref 70–99)
Potassium: 3.7 mmol/L (ref 3.5–5.1)
Sodium: 138 mmol/L (ref 135–145)
Total Bilirubin: 0.5 mg/dL (ref 0.3–1.2)
Total Protein: 7.8 g/dL (ref 6.5–8.1)

## 2021-09-17 LAB — CBC WITH DIFFERENTIAL/PLATELET
Abs Immature Granulocytes: 0.05 10*3/uL (ref 0.00–0.07)
Basophils Absolute: 0.1 10*3/uL (ref 0.0–0.1)
Basophils Relative: 1 %
Eosinophils Absolute: 0.1 10*3/uL (ref 0.0–0.5)
Eosinophils Relative: 2 %
HCT: 28.3 % — ABNORMAL LOW (ref 39.0–52.0)
Hemoglobin: 9.3 g/dL — ABNORMAL LOW (ref 13.0–17.0)
Immature Granulocytes: 1 %
Lymphocytes Relative: 20 %
Lymphs Abs: 1.8 10*3/uL (ref 0.7–4.0)
MCH: 28.9 pg (ref 26.0–34.0)
MCHC: 32.9 g/dL (ref 30.0–36.0)
MCV: 87.9 fL (ref 80.0–100.0)
Monocytes Absolute: 0.6 10*3/uL (ref 0.1–1.0)
Monocytes Relative: 6 %
Neutro Abs: 6.3 10*3/uL (ref 1.7–7.7)
Neutrophils Relative %: 70 %
Platelets: 292 10*3/uL (ref 150–400)
RBC: 3.22 MIL/uL — ABNORMAL LOW (ref 4.22–5.81)
RDW: 14.4 % (ref 11.5–15.5)
WBC: 8.9 10*3/uL (ref 4.0–10.5)
nRBC: 0 % (ref 0.0–0.2)

## 2021-09-17 NOTE — Progress Notes (Signed)
[place] Telephone:([phone]   Fax:(336) [phone]  PROGRESS NOTE  Patient Care Team: Iona Beard, MD as PCP - General (Family Medicine)  Hematological/Oncological History # Metastatic Prostate Cancer w/ Spread to Intraabdominal Lymph Nodes --previously followed by Dr. Chryl Heck. Currently on abiraterone therapy 06/18/2021: PSA <0.01 07/23/2021: establish care with Dr. Lorenso Courier   Interval History:  Joseph Rangel 75 y.o. male with medical history significant for metastatic prostate cancer who presents for a follow up visit. The patient's last visit was on 09/17/2021. In the interim since the last visit he has continued on abiraterone therapy.   On exam today Mr. Hehir presents today unaccompanied.  He reports that he has been compliant with his Zytiga pills and had no missed doses.  He denies any hot flashes or sweats.  He reports his appetite is "about the same".  He notes that his energy levels are "all right" and that he can walk some.  Overall he is content to continue with his current Zytiga therapy.  He notes no major changes in his health in the interim since her last visit.  He denies any hot flashes, sweats, or drop in energy levels.  He reports no shortness of breath or chest pain.  He also denies any muscle cramping or urinary symptoms.  He otherwise denies any fevers, chills, sweats, nausea, vomiting or diarrhea.  Full 10 point ROS is listed below.  MEDICAL HISTORY:  Past Medical History:  Diagnosis Date   Arthritis    Cancer [facility])    prostate    Frequency of urination    Hypertension    Seizures ([place])    after brain surg - none in past 20 yrs   Speech disorder    due to brain surg    SURGICAL HISTORY: Past Surgical History:  Procedure Laterality Date   BRAIN SURGERY     BLOOD CLOT REMOVED 1980   MASS EXCISION  02/24/2012   Procedure: MINOR EXCISION OF MASS;  Surgeon: Bernestine Amass, MD;  Location: [facility];  Service: Urology;;  excision of neck lesion    ROBOT ASSISTED LAPAROSCOPIC RADICAL PROSTATECTOMY  02/24/2012   Procedure: ROBOTIC ASSISTED LAPAROSCOPIC RADICAL PROSTATECTOMY;  Surgeon: Bernestine Amass, MD;  Location: [facility];  Service: Urology;  Laterality: N/A;           SOCIAL HISTORY: Social History   Socioeconomic History   Marital status: Married    Spouse name: Not on file   Number of children: Not on file   Years of education: Not on file   Highest education level: Not on file  Occupational History   Not on file  Tobacco Use   Smoking status: Every Day    Types: Cigarettes   Smokeless tobacco: Never   Tobacco comments:    2 cigarettes per day  Vaping Use   Vaping Use: Never used  Substance and Sexual Activity   Alcohol use: No   Drug use: No   Sexual activity: Not on file  Other Topics Concern   Not on file  Social History Narrative   Not on file   Social Determinants of Health   Financial Resource Strain: Not on file  Food Insecurity: Not on file  Transportation Needs: Not on file  Physical Activity: Not on file  Stress: Not on file  Social Connections: Not on file  Intimate Partner Violence: Not on file    FAMILY HISTORY: No family history on file.  ALLERGIES:  has No Known Allergies.  MEDICATIONS:  Current Outpatient Medications  Medication Sig Dispense Refill   abiraterone acetate (ZYTIGA) 250 MG tablet TAKE 4 TABLETS (1,000 MG TOTAL) BY MOUTH DAILY. TAKE ON AN EMPTY STOMACH 1 HOUR BEFORE OR 2 HOURS AFTER A MEAL 120 tablet 0   atenolol (TENORMIN) 25 MG tablet Take 25 mg by mouth daily.     buprenorphine (BUTRANS) 7.5 MCG/HR  (Patient not taking: Reported on 08/20/2021)     cilostazol (PLETAL) 50 MG tablet Take 50 mg by mouth daily.     fenofibrate (TRICOR) 48 MG tablet Take 48 mg by mouth daily.     gabapentin (NEURONTIN) 100 MG capsule Take by mouth.     hydrochlorothiazide (HYDRODIURIL) 25 MG tablet Take 25 mg by mouth daily.     HYDROcodone-acetaminophen (NORCO) 10-325 MG tablet Take 1  tablet by mouth every 4 (four) hours as needed for moderate pain.     latanoprost (XALATAN) 0.005 % ophthalmic solution Place 1 drop into both eyes every other day. At night     meloxicam (MOBIC) 15 MG tablet Take 15 mg by mouth daily.     olmesartan (BENICAR) 40 MG tablet Take 40 mg by mouth daily.     omeprazole (PRILOSEC) 40 MG capsule Take 40 mg by mouth daily.     phenytoin (DILANTIN) 100 MG ER capsule Take 300 mg by mouth daily.     potassium chloride SA (KLOR-CON M) 20 MEQ tablet Take 1 tablet (20 mEq total) by mouth once for 1 dose. 30 tablet 1   predniSONE (DELTASONE) 5 MG tablet TAKE 1 TABLET BY MOUTH ONCE A DAY WITH BREAKFAST 90 tablet 3   verapamil (VERELAN PM) 360 MG 24 hr capsule Take 360 mg by mouth daily.     No current facility-administered medications for this visit.    REVIEW OF SYSTEMS:   Constitutional: ( - ) fevers, ( - )  chills , ( - ) night sweats Eyes: ( - ) blurriness of vision, ( - ) double vision, ( - ) watery eyes Ears, nose, mouth, throat, and face: ( - ) mucositis, ( - ) sore throat Respiratory: ( - ) cough, ( - ) dyspnea, ( - ) wheezes Cardiovascular: ( - ) palpitation, ( - ) chest discomfort, ( - ) lower extremity swelling Gastrointestinal:  ( - ) nausea, ( - ) heartburn, ( - ) change in bowel habits Skin: ( - ) abnormal skin rashes Lymphatics: ( - ) new lymphadenopathy, ( - ) easy bruising Neurological: ( - ) numbness, ( - ) tingling, ( - ) new weaknesses Behavioral/Psych: ( - ) mood change, ( - ) new changes  All other systems were reviewed with the patient and are negative.  PHYSICAL EXAMINATION: ECOG PERFORMANCE STATUS: 2 - Symptomatic, <50% confined to bed  Vitals:   09/17/21 1510  BP: (!) 152/84  Pulse: 84  Resp: 18  Temp: 97.9 F (36.6 C)  SpO2: 100%    Filed Weights   09/17/21 1510  Weight: 156 lb 12.8 oz (71.1 kg)     GENERAL: Chronically ill-appearing elderly African-American male, alert, no distress and comfortable SKIN: skin  color, texture, turgor are normal, no rashes or significant lesions EYES: conjunctiva are pink and non-injected, sclera clear LUNGS: clear to auscultation and percussion with normal breathing effort HEART: regular rate & rhythm and no murmurs and no lower extremity edema ABDOMEN: soft, non-tender, non-distended, normal bowel sounds PSYCH: alert & oriented x 3, fluent speech NEURO: no focal motor/sensory deficits  LABORATORY DATA:  I have reviewed the data as listed CBC Latest Ref Rng & Units 09/17/2021 08/20/2021 07/23/2021  WBC 4.0 - 10.5 K/uL 8.9 7.7 8.1  Hemoglobin 13.0 - 17.0 g/dL 9.3(L) 8.3(L) 9.7(L)  Hematocrit 39.0 - 52.0 % 28.3(L) 25.4(L) 29.0(L)  Platelets 150 - 400 K/uL 292 308 301    CMP Latest Ref Rng & Units 09/17/2021 08/20/2021 07/23/2021  Glucose 70 - 99 mg/dL 106(H) 129(H) 124(H)  BUN 8 - 23 mg/dL 14 11 13   Creatinine 0.61 - 1.24 mg/dL 0.88 0.99 1.09  Sodium 135 - 145 mmol/L 138 142 140  Potassium 3.5 - 5.1 mmol/L 3.7 2.7(LL) 3.3(L)  Chloride 98 - 111 mmol/L 106 104 106  CO2 22 - 32 mmol/L 24 27 24   Calcium 8.9 - 10.3 mg/dL 9.1 8.6(L) 8.7(L)  Total Protein 6.5 - 8.1 g/dL 7.8 7.0 7.3  Total Bilirubin 0.3 - 1.2 mg/dL 0.5 0.2(L) 0.2(L)  Alkaline Phos 38 - 126 U/L 66 69 67  AST 15 - 41 U/L 12(L) 11(L) 13(L)  ALT 0 - 44 U/L 8 <5 5   RADIOGRAPHIC STUDIES: CT CHEST ABDOMEN PELVIS W CONTRAST  Result Date: 08/28/2021 CLINICAL DATA:  Prostate cancer diagnosed last year, ongoing hormone therapy. Prostatectomy. EXAM: CT CHEST, ABDOMEN, AND PELVIS WITH CONTRAST TECHNIQUE: Multidetector CT imaging of the chest, abdomen and pelvis was performed following the standard protocol during bolus administration of intravenous contrast. CONTRAST:  39mL OMNIPAQUE IOHEXOL 350 MG/ML SOLN COMPARISON:  Multiple exams, including whole-body bone scan of 01/20/2021 and FDG PET CT dated 09/02/2020 FINDINGS: CT CHEST FINDINGS Cardiovascular: Atherosclerotic calcification of the aortic arch. Small  cystic prominence of the superior pericardial recess in the anterior mediastinum on image 22 series 2, not previously hypermetabolic. Borderline enlargement of the cardiopericardial silhouette. Mediastinum/Nodes: Mild circumferential distal esophageal wall thickening, nonspecific but esophagitis would be a common cause. No pathologic thoracic adenopathy is identified. Lungs/Pleura: Mucus in the bronchus intermedius causing plugging in the right middle lobe and right lower lobe bronchi. No findings of metastatic disease to the lungs. Musculoskeletal: Old healed right-sided rib fractures. Thoracic spondylosis. CT ABDOMEN PELVIS FINDINGS Hepatobiliary: Unremarkable Pancreas: Unremarkable Spleen: Unremarkable Adrenals/Urinary Tract: Unremarkable Stomach/Bowel: There is wall thickening in the stomach antrum for example on image 54 series 2, this may be due to nondistention but antritis is not excluded, correlate with any associated symptoms. Normal appendix. No dilated bowel. Vascular/Lymphatic: Mostly mild aortic atherosclerotic vascular disease. The prior pathologic adenopathy has resolved. For example, the lymph node anterior to the abdominal aorta previously measuring 1.5 cm in short axis currently measures 0.5 cm in short axis. The other retroperitoneal lymph nodes are similar the substantially reduced in volume. Previous clustered perirectal and superior rectal adenopathy likewise substantially improved, with a superior rectal node only 0.5 cm in short axis on image 93 series 2, previously 1.9 cm. A lower left perirectal lymph node previously measuring 0.9 cm in short axis has resolved. Reproductive: Prostatectomy. Other: Mild stranding along the margins of the perirectal space, likely related to prior therapy. Musculoskeletal: Moderate degenerative hip arthropathy bilaterally. Lumbar spondylosis and degenerative disc disease causing mild levels of impingement at L2-3, L3-4, L4-5, and L5-S1. Small supraumbilical  hernia contains adipose tissue. IMPRESSION: 1. Prior adenopathy has resolved compatible with excellent response to therapy. 2. Mild circumferential distal esophageal wall thickening, nonspecific although esophagitis would be a common cause. 3. There is also some mild wall thickening in the stomach antrum which could be from nondistention but can also be associated with  gastritis. Next Aortic Atherosclerosis (ICD10-I70.0). 4. There is some mucus plugging in the distal bronchus intermedius extending into the right middle lobe and right lower lobe. 5. Multilevel mild lumbar impingement due to spondylosis and degenerative disc disease. Moderate degenerative hip arthropathy bilaterally. Electronically Signed   By: Van Clines M.D.   On: 08/28/2021 20:52    ASSESSMENT & PLAN Maddock Finigan 75 y.o. male with medical history significant for metastatic prostate cancer who presents for a follow up visit.  PMH significant for prostate adenocarcinoma Gleason 3+4, no LN involvement s.p robotic prostatectomy in 2013, presented with peri aortic left iliac and peri rectal LN, with biopsy confirming metastatic prostate adenoca now on zytiga and lupron presents here for FU on his metastatic prostate cancer. Baseline PSA of 207.  # Metastatic Castrate Sensitive Prostate Cancer -- continue on Zytiga 1000mg  PO daily with prednisone -- last lupron on 07/11/2021. Next due in Jan 2023 -- last PSA on 06/18/2021 showed PSA <0.01  --CT scan on 08/28/2021 showed excellent response to therapy.  --RTC in 4 weeks for continued monitoring on therapy. Will reconnect patient with Dr. Chryl Heck.   Orders Placed This Encounter  Procedures   CMP ([place] only)   All questions were answered. The patient knows to call the clinic with any problems, questions or concerns.  A total of more than 30 minutes were spent on this encounter with face-to-face time and non-face-to-face time, including preparing to see the patient, ordering  tests and/or medications, counseling the patient and coordination of care as outlined above.   Ledell Peoples, MD Department of Hematology/Oncology [place] at [facility] Phone: [phone] Pager: [phone] Email: [email or web address]  09/23/2021 11:38 AM

## 2021-09-19 ENCOUNTER — Other Ambulatory Visit (HOSPITAL_COMMUNITY): Payer: Self-pay

## 2021-09-23 ENCOUNTER — Telehealth: Payer: Self-pay | Admitting: Hematology and Oncology

## 2021-09-23 ENCOUNTER — Encounter: Payer: Self-pay | Admitting: Hematology and Oncology

## 2021-09-23 DIAGNOSIS — Z79899 Other long term (current) drug therapy: Secondary | ICD-10-CM | POA: Diagnosis not present

## 2021-09-23 NOTE — Telephone Encounter (Signed)
Scheduled per 01/03 staff message, patient has been called and notified.

## 2021-10-07 ENCOUNTER — Other Ambulatory Visit: Payer: Self-pay | Admitting: Hematology and Oncology

## 2021-10-07 ENCOUNTER — Telehealth: Payer: Self-pay | Admitting: *Deleted

## 2021-10-07 MED ORDER — ABIRATERONE ACETATE 250 MG PO TABS: 1000.0000 mg | ORAL_TABLET | Freq: Every day | ORAL | 0 refills | Status: DC

## 2021-10-07 NOTE — Telephone Encounter (Signed)
Rachel Bo , pharmacy technician from [place] called requesting refill of  Zytiga  250 mg for pt.  [place]     [phone]    ;    fax       [phone]

## 2021-10-15 ENCOUNTER — Inpatient Hospital Stay: Payer: Medicare HMO

## 2021-10-15 ENCOUNTER — Encounter: Payer: Self-pay | Admitting: Hematology and Oncology

## 2021-10-15 ENCOUNTER — Ambulatory Visit: Payer: Medicare HMO

## 2021-10-15 ENCOUNTER — Other Ambulatory Visit: Payer: Self-pay | Admitting: Hematology and Oncology

## 2021-10-15 ENCOUNTER — Other Ambulatory Visit: Payer: Self-pay

## 2021-10-15 ENCOUNTER — Ambulatory Visit: Payer: Medicare HMO | Admitting: Hematology and Oncology

## 2021-10-15 ENCOUNTER — Inpatient Hospital Stay: Payer: Medicare HMO | Attending: Hematology and Oncology

## 2021-10-15 ENCOUNTER — Inpatient Hospital Stay: Payer: Medicare HMO | Admitting: Hematology and Oncology

## 2021-10-15 ENCOUNTER — Other Ambulatory Visit: Payer: Medicare HMO

## 2021-10-15 VITALS — BP 151/90 | HR 84 | Temp 97.5°F | Resp 16 | Ht 69.0 in | Wt 160.4 lb

## 2021-10-15 DIAGNOSIS — C772 Secondary and unspecified malignant neoplasm of intra-abdominal lymph nodes: Secondary | ICD-10-CM

## 2021-10-15 DIAGNOSIS — C61 Malignant neoplasm of prostate: Secondary | ICD-10-CM | POA: Insufficient documentation

## 2021-10-15 DIAGNOSIS — I10 Essential (primary) hypertension: Secondary | ICD-10-CM | POA: Diagnosis not present

## 2021-10-15 DIAGNOSIS — G40909 Epilepsy, unspecified, not intractable, without status epilepticus: Secondary | ICD-10-CM | POA: Insufficient documentation

## 2021-10-15 DIAGNOSIS — F17210 Nicotine dependence, cigarettes, uncomplicated: Secondary | ICD-10-CM | POA: Insufficient documentation

## 2021-10-15 DIAGNOSIS — Z79818 Long term (current) use of other agents affecting estrogen receptors and estrogen levels: Secondary | ICD-10-CM | POA: Diagnosis not present

## 2021-10-15 DIAGNOSIS — M129 Arthropathy, unspecified: Secondary | ICD-10-CM | POA: Diagnosis not present

## 2021-10-15 LAB — COMPREHENSIVE METABOLIC PANEL
ALT: <5 U/L (ref 0–44)
AST: 10 U/L — ABNORMAL LOW (ref 15–41)
Albumin: 4.2 g/dL (ref 3.5–5.0)
Alkaline Phosphatase: 68 U/L (ref 38–126)
Anion gap: 8 (ref 5–15)
BUN: 17 mg/dL (ref 8–23)
CO2: 27 mmol/L (ref 22–32)
Calcium: 9 mg/dL (ref 8.9–10.3)
Chloride: 104 mmol/L (ref 98–111)
Creatinine, Ser: 0.95 mg/dL (ref 0.61–1.24)
GFR, Estimated: >60 mL/min (ref >60)
Glucose, Bld: 126 mg/dL — ABNORMAL HIGH (ref 70–99)
Potassium: 3.2 mmol/L — ABNORMAL LOW (ref 3.5–5.1)
Sodium: 139 mmol/L (ref 135–145)
Total Bilirubin: 0.4 mg/dL (ref 0.3–1.2)
Total Protein: 7.3 g/dL (ref 6.5–8.1)

## 2021-10-15 LAB — CBC WITH DIFFERENTIAL/PLATELET
Abs Immature Granulocytes: 0.04 10*3/uL (ref 0.00–0.07)
Basophils Absolute: 0 10*3/uL (ref 0.0–0.1)
Basophils Relative: 1 %
Eosinophils Absolute: 0.1 10*3/uL (ref 0.0–0.5)
Eosinophils Relative: 2 %
HCT: 28 % — ABNORMAL LOW (ref 39.0–52.0)
Hemoglobin: 9.3 g/dL — ABNORMAL LOW (ref 13.0–17.0)
Immature Granulocytes: 1 %
Lymphocytes Relative: 17 %
Lymphs Abs: 1.4 10*3/uL (ref 0.7–4.0)
MCH: 29.2 pg (ref 26.0–34.0)
MCHC: 33.2 g/dL (ref 30.0–36.0)
MCV: 87.8 fL (ref 80.0–100.0)
Monocytes Absolute: 0.5 10*3/uL (ref 0.1–1.0)
Monocytes Relative: 6 %
Neutro Abs: 6.4 10*3/uL (ref 1.7–7.7)
Neutrophils Relative %: 73 %
Platelets: 277 10*3/uL (ref 150–400)
RBC: 3.19 MIL/uL — ABNORMAL LOW (ref 4.22–5.81)
RDW: 14.5 % (ref 11.5–15.5)
WBC: 8.6 10*3/uL (ref 4.0–10.5)
nRBC: 0 % (ref 0.0–0.2)

## 2021-10-15 MED ORDER — LEUPROLIDE ACETATE (3 MONTH) 22.5 MG ~~LOC~~ KIT
22.5000 mg | PACK | Freq: Once | SUBCUTANEOUS | Status: AC
  Administered 2021-10-15: 14:00:00 22.5 mg via SUBCUTANEOUS
  Filled 2021-10-15: qty 22.5

## 2021-10-15 MED ORDER — ABIRATERONE ACETATE 250 MG PO TABS: 1000.0000 mg | ORAL_TABLET | Freq: Every day | ORAL | 0 refills | Status: DC

## 2021-10-15 MED ORDER — PREDNISONE 5 MG PO TABS: ORAL_TABLET | Freq: Every day | ORAL | 3 refills | Status: DC

## 2021-10-15 NOTE — Progress Notes (Signed)
Joseph Rangel   [phone]  PROGRESS NOTE  Patient Care Team: Iona Beard, MD as PCP - General (Family Medicine)  Hematological/Oncological History # Metastatic Prostate Cancer w/ Spread to Intraabdominal Lymph Nodes --previously followed by Dr. Chryl Heck. Currently on abiraterone therapy 06/18/2021: PSA <0.01 07/23/2021: establish care with Dr. Lorenso Courier   Interval History:   Joseph Rangel 76 y.o. male with medical history significant for metastatic prostate cancer who presents for a follow up visit. The patient's last visit was on 09/17/2021. In the interim since the last visit he has continued on abiraterone therapy.   On exam today Joseph Rangel presents today unaccompanied.  He has been taking his medications as prescribed.  He takes 4 tablets of Zytiga every day and prednisone daily.  No change in health.  No new bone pains, GI symptoms.  He is due for his Lupron today.  Since last visit the right knee has improved significantly and he is able to walk with a cane.  No change in urinary habits.  Rest of the pertinent 10 point ROS reviewed and negative.   MEDICAL HISTORY:  Past Medical History:  Diagnosis Date   Arthritis    Cancer [facility])    prostate    Frequency of urination    Hypertension    Seizures ([place])    after brain surg - none in past 20 yrs   Speech disorder    due to brain surg    SURGICAL HISTORY: Past Surgical History:  Procedure Laterality Date   BRAIN SURGERY     BLOOD CLOT REMOVED 1980   MASS EXCISION  02/24/2012   Procedure: MINOR EXCISION OF MASS;  Surgeon: Bernestine Amass, MD;  Location: [facility];  Service: Urology;;  excision of neck lesion   ROBOT ASSISTED LAPAROSCOPIC RADICAL PROSTATECTOMY  02/24/2012   Procedure: ROBOTIC ASSISTED LAPAROSCOPIC RADICAL PROSTATECTOMY;  Surgeon: Bernestine Amass, MD;  Location: [facility];  Service: Urology;  Laterality: N/A;           SOCIAL HISTORY: Social History   Socioeconomic  History   Marital status: Married    Spouse name: Not on file   Number of children: Not on file   Years of education: Not on file   Highest education level: Not on file  Occupational History   Not on file  Tobacco Use   Smoking status: Every Day    Types: Cigarettes   Smokeless tobacco: Never   Tobacco comments:    2 cigarettes per day  Vaping Use   Vaping Use: Never used  Substance and Sexual Activity   Alcohol use: No   Drug use: No   Sexual activity: Not on file  Other Topics Concern   Not on file  Social History Narrative   Not on file   Social Determinants of Health   Financial Resource Strain: Not on file  Food Insecurity: Not on file  Transportation Needs: Not on file  Physical Activity: Not on file  Stress: Not on file  Social Connections: Not on file  Intimate Partner Violence: Not on file    FAMILY HISTORY: No family history on file.  ALLERGIES:  has No Known Allergies.  MEDICATIONS:  Current Outpatient Medications  Medication Sig Dispense Refill   abiraterone acetate (ZYTIGA) 250 MG tablet TAKE 4 TABLETS (1,000 MG TOTAL) BY MOUTH DAILY. TAKE ON AN EMPTY STOMACH 1 HOUR BEFORE OR 2 HOURS AFTER A MEAL 120 tablet 0   atenolol (TENORMIN) 25 MG  tablet Take 25 mg by mouth daily.     buprenorphine (BUTRANS) 7.5 MCG/HR  (Patient not taking: Reported on 08/20/2021)     cilostazol (PLETAL) 50 MG tablet Take 50 mg by mouth daily.     fenofibrate (TRICOR) 48 MG tablet Take 48 mg by mouth daily.     gabapentin (NEURONTIN) 100 MG capsule Take by mouth.     hydrochlorothiazide (HYDRODIURIL) 25 MG tablet Take 25 mg by mouth daily.     HYDROcodone-acetaminophen (NORCO) 10-325 MG tablet Take 1 tablet by mouth every 4 (four) hours as needed for moderate pain.     latanoprost (XALATAN) 0.005 % ophthalmic solution Place 1 drop into both eyes every other day. At night     meloxicam (MOBIC) 15 MG tablet Take 15 mg by mouth daily.     olmesartan (BENICAR) 40 MG tablet Take 40  mg by mouth daily.     omeprazole (PRILOSEC) 40 MG capsule Take 40 mg by mouth daily.     phenytoin (DILANTIN) 100 MG ER capsule Take 300 mg by mouth daily.     potassium chloride SA (KLOR-CON M) 20 MEQ tablet Take 1 tablet (20 mEq total) by mouth once for 1 dose. 30 tablet 1   predniSONE (DELTASONE) 5 MG tablet TAKE 1 TABLET BY MOUTH ONCE A DAY WITH BREAKFAST 90 tablet 3   verapamil (VERELAN PM) 360 MG 24 hr capsule Take 360 mg by mouth daily.     No current facility-administered medications for this visit.    REVIEW OF SYSTEMS:   Constitutional: ( - ) fevers, ( - )  chills , ( - ) night sweats Eyes: ( - ) blurriness of vision, ( - ) double vision, ( - ) watery eyes Ears, nose, mouth, throat, and face: ( - ) mucositis, ( - ) sore throat Respiratory: ( - ) cough, ( - ) dyspnea, ( - ) wheezes Cardiovascular: ( - ) palpitation, ( - ) chest discomfort, ( - ) lower extremity swelling Gastrointestinal:  ( - ) nausea, ( - ) heartburn, ( - ) change in bowel habits Skin: ( - ) abnormal skin rashes Lymphatics: ( - ) new lymphadenopathy, ( - ) easy bruising Neurological: ( - ) numbness, ( - ) tingling, ( - ) new weaknesses Behavioral/Psych: ( - ) mood change, ( - ) new changes  All other systems were reviewed with the patient and are negative.  PHYSICAL EXAMINATION: ECOG PERFORMANCE STATUS: 2 - Symptomatic, <50% confined to bed  Vitals:   10/15/21 1254  BP: (!) 151/90  Pulse: 84  Resp: 16  Temp: (!) 97.5 F (36.4 C)  SpO2: 99%    Filed Weights   10/15/21 1254  Weight: 160 lb 6.4 oz (72.8 kg)     GENERAL: Chronically ill-appearing elderly African-American male, alert, no distress and comfortable SKIN: skin color, texture, turgor are normal, no rashes or significant lesions EYES: conjunctiva are pink and non-injected, sclera clear LUNGS: clear to auscultation and percussion with normal breathing effort HEART: regular rate & rhythm and no murmurs and no lower extremity  edema ABDOMEN: soft, non-tender, non-distended, normal bowel sounds PSYCH: alert & oriented x 3, fluent speech NEURO: no focal motor/sensory deficits Musculoskeletal: Mild lower extremity edema,  LABORATORY DATA:  I have reviewed the data as listed CBC Latest Ref Rng & Units 10/15/2021 09/17/2021 08/20/2021  WBC 4.0 - 10.5 K/uL 8.6 8.9 7.7  Hemoglobin 13.0 - 17.0 g/dL 9.3(L) 9.3(L) 8.3(L)  Hematocrit 39.0 - 52.0 %  28.0(L) 28.3(L) 25.4(L)  Platelets 150 - 400 K/uL 277 292 308    CMP Latest Ref Rng & Units 10/15/2021 09/17/2021 08/20/2021  Glucose 70 - 99 mg/dL 126(H) 106(H) 129(H)  BUN 8 - 23 mg/dL 17 14 11   Creatinine 0.61 - 1.24 mg/dL 0.95 0.88 0.99  Sodium 135 - 145 mmol/L 139 138 142  Potassium 3.5 - 5.1 mmol/L 3.2(L) 3.7 2.7(LL)  Chloride 98 - 111 mmol/L 104 106 104  CO2 22 - 32 mmol/L 27 24 27   Calcium 8.9 - 10.3 mg/dL 9.0 9.1 8.6(L)  Total Protein 6.5 - 8.1 g/dL 7.3 7.8 7.0  Total Bilirubin 0.3 - 1.2 mg/dL 0.4 0.5 0.2(L)  Alkaline Phos 38 - 126 U/L 68 66 69  AST 15 - 41 U/L 10(L) 12(L) 11(L)  ALT 0 - 44 U/L <5 8 <5   RADIOGRAPHIC STUDIES: No results found.  ASSESSMENT & PLAN Joseph Rangel 76 y.o. male with medical history significant for metastatic prostate cancer who presents for a follow up visit.  PMH significant for prostate adenocarcinoma Gleason 3+4, no LN involvement s.p robotic prostatectomy in 2013, presented with peri aortic left iliac and peri rectal LN, with biopsy confirming metastatic prostate adenoca now on zytiga and lupron presents here for FU on his metastatic prostate cancer. Baseline PSA of 207.  # Metastatic Castrate Sensitive Prostate Cancer -- continue on Zytiga 1000mg  PO daily with prednisone -- last lupron on 07/11/2021. Next due in Jan 2023 -- last PSA on 06/18/2021 showed PSA <0.01  --CT scan on 08/28/2021 showed excellent response to therapy.  --I will recommend going back to the lab for PSA draw.  He will return to clinic in 8 weeks.  Orders  Placed This Encounter  Procedures   PSA, total and free    Standing Status:   Standing    Number of Occurrences:   20    Standing Expiration Date:   10/15/2022   All questions were answered. The patient knows to call the clinic with any problems, questions or concerns.  A total of more than 20 minutes were spent on this encounter with face-to-face time and non-face-to-face time, including preparing to see the patient, ordering tests and/or medications, counseling the patient and coordination of care as outlined above.   Benay Pike MD   10/15/2021 1:39 PM

## 2021-10-16 DIAGNOSIS — Z0000 Encounter for general adult medical examination without abnormal findings: Secondary | ICD-10-CM | POA: Diagnosis not present

## 2021-10-16 DIAGNOSIS — I10 Essential (primary) hypertension: Secondary | ICD-10-CM | POA: Diagnosis not present

## 2021-10-16 DIAGNOSIS — E559 Vitamin D deficiency, unspecified: Secondary | ICD-10-CM | POA: Diagnosis not present

## 2021-10-16 DIAGNOSIS — R5383 Other fatigue: Secondary | ICD-10-CM | POA: Diagnosis not present

## 2021-10-16 DIAGNOSIS — E7800 Pure hypercholesterolemia, unspecified: Secondary | ICD-10-CM | POA: Diagnosis not present

## 2021-10-16 DIAGNOSIS — Z79899 Other long term (current) drug therapy: Secondary | ICD-10-CM | POA: Diagnosis not present

## 2021-10-16 LAB — PSA, TOTAL AND FREE
PSA, Free Pct: UNDETERMINED %
PSA, Free: <0.02 ng/mL
Prostate Specific Ag, Serum: <0.1 ng/mL (ref 0.0–4.0)

## 2021-10-28 ENCOUNTER — Other Ambulatory Visit: Payer: Self-pay | Admitting: *Deleted

## 2021-10-28 ENCOUNTER — Other Ambulatory Visit (HOSPITAL_COMMUNITY): Payer: Self-pay

## 2021-10-28 NOTE — Telephone Encounter (Signed)
This RN was contacted by the pt who states he is about out of his medication ( zytiga ) and has not received his next shipment and was told it could not be sent out until 11/08/2021.  Per call to [facility]- 120 tablets prescription was sent with noted delivery as 10/14/2021 - " package received at his door ".  Pt states per above inquiry no one in his home received the medication and he didn't know it was being delivered.  This RN informed [facility] per above with possible prescription taken from his porch without his knowledge.  Per multiple transfers this RN spoke with Corene Cornea who per his inquiry states due to delay in notifying [facility] missed delivery ( needs to be within 24 hours of expected delivery. ) but he did run for next refill- which can be sent out 10/31/2021.  Pt is aware of above.

## 2021-11-07 ENCOUNTER — Other Ambulatory Visit: Payer: Self-pay | Admitting: *Deleted

## 2021-11-12 ENCOUNTER — Other Ambulatory Visit: Payer: Self-pay | Admitting: Hematology and Oncology

## 2021-11-12 ENCOUNTER — Other Ambulatory Visit (HOSPITAL_COMMUNITY): Payer: Self-pay

## 2021-11-12 MED ORDER — PREDNISONE 5 MG PO TABS
ORAL_TABLET | Freq: Every day | ORAL | 3 refills | Status: DC
  Filled 2021-11-12: qty 90, 90d supply, fill #0

## 2021-11-13 ENCOUNTER — Other Ambulatory Visit (HOSPITAL_COMMUNITY): Payer: Self-pay

## 2021-11-15 ENCOUNTER — Other Ambulatory Visit (HOSPITAL_COMMUNITY): Payer: Self-pay

## 2021-11-17 DIAGNOSIS — H6121 Impacted cerumen, right ear: Secondary | ICD-10-CM | POA: Diagnosis not present

## 2021-11-17 DIAGNOSIS — C61 Malignant neoplasm of prostate: Secondary | ICD-10-CM | POA: Diagnosis not present

## 2021-11-17 DIAGNOSIS — H6123 Impacted cerumen, bilateral: Secondary | ICD-10-CM | POA: Diagnosis not present

## 2021-11-17 DIAGNOSIS — M545 Low back pain, unspecified: Secondary | ICD-10-CM | POA: Diagnosis not present

## 2021-11-17 DIAGNOSIS — N183 Chronic kidney disease, stage 3 unspecified: Secondary | ICD-10-CM | POA: Diagnosis not present

## 2021-11-17 DIAGNOSIS — I739 Peripheral vascular disease, unspecified: Secondary | ICD-10-CM | POA: Diagnosis not present

## 2021-11-17 DIAGNOSIS — Z79899 Other long term (current) drug therapy: Secondary | ICD-10-CM | POA: Diagnosis not present

## 2021-11-18 ENCOUNTER — Other Ambulatory Visit (HOSPITAL_COMMUNITY): Payer: Self-pay

## 2021-11-19 ENCOUNTER — Other Ambulatory Visit (HOSPITAL_COMMUNITY): Payer: Self-pay

## 2021-11-22 ENCOUNTER — Other Ambulatory Visit: Payer: Self-pay | Admitting: Hematology and Oncology

## 2021-11-24 ENCOUNTER — Other Ambulatory Visit: Payer: Self-pay | Admitting: *Deleted

## 2021-11-24 DIAGNOSIS — Z79899 Other long term (current) drug therapy: Secondary | ICD-10-CM | POA: Diagnosis not present

## 2021-11-25 ENCOUNTER — Encounter: Payer: Self-pay | Admitting: Hematology and Oncology

## 2021-12-04 ENCOUNTER — Other Ambulatory Visit: Payer: Self-pay | Admitting: Physician Assistant

## 2021-12-08 DIAGNOSIS — M150 Primary generalized (osteo)arthritis: Secondary | ICD-10-CM | POA: Diagnosis not present

## 2021-12-08 DIAGNOSIS — E785 Hyperlipidemia, unspecified: Secondary | ICD-10-CM | POA: Diagnosis not present

## 2021-12-08 DIAGNOSIS — I10 Essential (primary) hypertension: Secondary | ICD-10-CM | POA: Diagnosis not present

## 2021-12-08 DIAGNOSIS — R569 Unspecified convulsions: Secondary | ICD-10-CM | POA: Diagnosis not present

## 2021-12-08 DIAGNOSIS — Z720 Tobacco use: Secondary | ICD-10-CM | POA: Diagnosis not present

## 2021-12-08 DIAGNOSIS — I739 Peripheral vascular disease, unspecified: Secondary | ICD-10-CM | POA: Diagnosis not present

## 2021-12-08 DIAGNOSIS — C61 Malignant neoplasm of prostate: Secondary | ICD-10-CM | POA: Diagnosis not present

## 2021-12-10 ENCOUNTER — Inpatient Hospital Stay: Payer: Medicare HMO

## 2021-12-10 ENCOUNTER — Inpatient Hospital Stay: Payer: Medicare HMO | Admitting: Hematology and Oncology

## 2021-12-15 ENCOUNTER — Ambulatory Visit: Payer: Medicare HMO | Admitting: Hematology and Oncology

## 2021-12-15 DIAGNOSIS — E559 Vitamin D deficiency, unspecified: Secondary | ICD-10-CM | POA: Diagnosis not present

## 2021-12-15 DIAGNOSIS — R030 Elevated blood-pressure reading, without diagnosis of hypertension: Secondary | ICD-10-CM | POA: Diagnosis not present

## 2021-12-15 DIAGNOSIS — M129 Arthropathy, unspecified: Secondary | ICD-10-CM | POA: Diagnosis not present

## 2021-12-15 DIAGNOSIS — C61 Malignant neoplasm of prostate: Secondary | ICD-10-CM | POA: Diagnosis not present

## 2021-12-15 DIAGNOSIS — E7800 Pure hypercholesterolemia, unspecified: Secondary | ICD-10-CM | POA: Diagnosis not present

## 2021-12-15 DIAGNOSIS — R5383 Other fatigue: Secondary | ICD-10-CM | POA: Diagnosis not present

## 2021-12-15 DIAGNOSIS — Z79899 Other long term (current) drug therapy: Secondary | ICD-10-CM | POA: Diagnosis not present

## 2021-12-15 DIAGNOSIS — H6121 Impacted cerumen, right ear: Secondary | ICD-10-CM | POA: Diagnosis not present

## 2021-12-15 DIAGNOSIS — I739 Peripheral vascular disease, unspecified: Secondary | ICD-10-CM | POA: Diagnosis not present

## 2021-12-15 DIAGNOSIS — M545 Low back pain, unspecified: Secondary | ICD-10-CM | POA: Diagnosis not present

## 2021-12-16 ENCOUNTER — Other Ambulatory Visit: Payer: Self-pay

## 2021-12-16 ENCOUNTER — Encounter: Payer: Self-pay | Admitting: Hematology and Oncology

## 2021-12-16 ENCOUNTER — Inpatient Hospital Stay: Payer: Medicare HMO

## 2021-12-16 ENCOUNTER — Inpatient Hospital Stay: Payer: Medicare HMO | Attending: Hematology and Oncology | Admitting: Hematology and Oncology

## 2021-12-16 VITALS — BP 173/97 | HR 88 | Temp 97.9°F | Resp 18 | Ht 69.0 in | Wt 160.0 lb

## 2021-12-16 DIAGNOSIS — M25562 Pain in left knee: Secondary | ICD-10-CM | POA: Insufficient documentation

## 2021-12-16 DIAGNOSIS — Z7952 Long term (current) use of systemic steroids: Secondary | ICD-10-CM | POA: Diagnosis not present

## 2021-12-16 DIAGNOSIS — Z79899 Other long term (current) drug therapy: Secondary | ICD-10-CM | POA: Diagnosis not present

## 2021-12-16 DIAGNOSIS — C772 Secondary and unspecified malignant neoplasm of intra-abdominal lymph nodes: Secondary | ICD-10-CM | POA: Diagnosis not present

## 2021-12-16 DIAGNOSIS — I10 Essential (primary) hypertension: Secondary | ICD-10-CM | POA: Insufficient documentation

## 2021-12-16 DIAGNOSIS — C61 Malignant neoplasm of prostate: Secondary | ICD-10-CM | POA: Diagnosis not present

## 2021-12-16 LAB — CBC WITH DIFFERENTIAL/PLATELET
Abs Immature Granulocytes: 0.04 10*3/uL (ref 0.00–0.07)
Basophils Absolute: 0.1 10*3/uL (ref 0.0–0.1)
Basophils Relative: 1 %
Eosinophils Absolute: 0.1 10*3/uL (ref 0.0–0.5)
Eosinophils Relative: 1 %
HCT: 29.6 % — ABNORMAL LOW (ref 39.0–52.0)
Hemoglobin: 9.6 g/dL — ABNORMAL LOW (ref 13.0–17.0)
Immature Granulocytes: 0 %
Lymphocytes Relative: 18 %
Lymphs Abs: 1.6 10*3/uL (ref 0.7–4.0)
MCH: 28.9 pg (ref 26.0–34.0)
MCHC: 32.4 g/dL (ref 30.0–36.0)
MCV: 89.2 fL (ref 80.0–100.0)
Monocytes Absolute: 0.5 10*3/uL (ref 0.1–1.0)
Monocytes Relative: 6 %
Neutro Abs: 6.7 10*3/uL (ref 1.7–7.7)
Neutrophils Relative %: 74 %
Platelets: 225 10*3/uL (ref 150–400)
RBC: 3.32 MIL/uL — ABNORMAL LOW (ref 4.22–5.81)
RDW: 14.2 % (ref 11.5–15.5)
WBC: 9 10*3/uL (ref 4.0–10.5)
nRBC: 0 % (ref 0.0–0.2)

## 2021-12-16 LAB — COMPREHENSIVE METABOLIC PANEL
ALT: 6 U/L (ref 0–44)
AST: 11 U/L — ABNORMAL LOW (ref 15–41)
Albumin: 4.2 g/dL (ref 3.5–5.0)
Alkaline Phosphatase: 72 U/L (ref 38–126)
Anion gap: 8 (ref 5–15)
BUN: 14 mg/dL (ref 8–23)
CO2: 24 mmol/L (ref 22–32)
Calcium: 8.8 mg/dL — ABNORMAL LOW (ref 8.9–10.3)
Chloride: 108 mmol/L (ref 98–111)
Creatinine, Ser: 1.04 mg/dL (ref 0.61–1.24)
GFR, Estimated: >60 mL/min (ref >60)
Glucose, Bld: 154 mg/dL — ABNORMAL HIGH (ref 70–99)
Potassium: 3.8 mmol/L (ref 3.5–5.1)
Sodium: 140 mmol/L (ref 135–145)
Total Bilirubin: 0.3 mg/dL (ref 0.3–1.2)
Total Protein: 7.3 g/dL (ref 6.5–8.1)

## 2021-12-16 NOTE — Progress Notes (Signed)
?[facility] ?Telephone:([phone]) [phone]   [phone] ? ?PROGRESS NOTE ? ?Patient Care Team: ?Iona Beard, MD as PCP - General (Family Medicine) ? ?Hematological/Oncological History ? ?# Metastatic Prostate Cancer w/ Spread to Intraabdominal Lymph Nodes ?--previously followed by Dr. Chryl Heck. Currently on abiraterone therapy ?06/18/2021: PSA <0.01 ? ?Interval History:  ? ?Joseph Rangel 76 y.o. male with medical history significant for metastatic prostate cancer who presents for a follow up visit. The patient's last visit was on 09/17/2021. In the interim since the last visit he has continued on abiraterone therapy.  ? ?On exam today Joseph Rangel presents today unaccompanied.  ?He has been taking his medications as prescribed.  He takes 4 tablets of Zytiga every day and prednisone daily.   ?He states today is his last day of Zytiga available and he was hoping for a refill.  He denies any new adverse effects with the Zytiga.  His left knee pain has improved and he has been able to walk with his cane again.  No change in bowel habits or urinary habits.  No new neurological complaints. ?Overall he has been pretty much the same since his last visit. ? ?Rest of the pertinent 10 point ROS reviewed and negative. ? ?MEDICAL HISTORY:  ?Past Medical History:  ?Diagnosis Date  ? Arthritis   ? Cancer [facility])   ? prostate   ? Frequency of urination   ? Hypertension   ? Seizures ([place])   ? after brain surg - none in past 20 yrs  ? Speech disorder   ? due to brain surg  ? ? ?SURGICAL HISTORY: ?Past Surgical History:  ?Procedure Laterality Date  ? BRAIN SURGERY    ? BLOOD CLOT REMOVED 1980  ? MASS EXCISION  02/24/2012  ? Procedure: MINOR EXCISION OF MASS;  Surgeon: Bernestine Amass, MD;  Location: [facility];  Service: Urology;;  excision of neck lesion  ? ROBOT ASSISTED LAPAROSCOPIC RADICAL PROSTATECTOMY  02/24/2012  ? Procedure: ROBOTIC ASSISTED LAPAROSCOPIC RADICAL PROSTATECTOMY;  Surgeon: Bernestine Amass, MD;  Location: [facility];   Service: Urology;  Laterality: N/A;   ? ? ? ? ?  ? ? ?SOCIAL HISTORY: ?Social History  ? ?Socioeconomic History  ? Marital status: Married  ?  Spouse name: Not on file  ? Number of children: Not on file  ? Years of education: Not on file  ? Highest education level: Not on file  ?Occupational History  ? Not on file  ?Tobacco Use  ? Smoking status: Every Day  ?  Types: Cigarettes  ? Smokeless tobacco: Never  ? Tobacco comments:  ?  2 cigarettes per day  ?Vaping Use  ? Vaping Use: Never used  ?Substance and Sexual Activity  ? Alcohol use: No  ? Drug use: No  ? Sexual activity: Not on file  ?Other Topics Concern  ? Not on file  ?Social History Narrative  ? Not on file  ? ?Social Determinants of Health  ? ?Financial Resource Strain: Not on file  ?Food Insecurity: Not on file  ?Transportation Needs: Not on file  ?Physical Activity: Not on file  ?Stress: Not on file  ?Social Connections: Not on file  ?Intimate Partner Violence: Not on file  ? ? ?FAMILY HISTORY: ?No family history on file. ? ?ALLERGIES:  has No Known Allergies. ? ?MEDICATIONS:  ?Current Outpatient Medications  ?Medication Sig Dispense Refill  ? abiraterone acetate (ZYTIGA) 250 MG tablet TAKE 4 TABLETS (1,000 MG TOTAL) BY MOUTH DAILY. TAKE ON  AN EMPTY STOMACH 1 HOUR BEFORE OR 2 HOURS AFTER A MEAL 120 tablet 0  ? atenolol (TENORMIN) 25 MG tablet Take 25 mg by mouth daily.    ? buprenorphine (BUTRANS) 7.5 MCG/HR  (Patient not taking: Reported on 08/20/2021)    ? cilostazol (PLETAL) 50 MG tablet Take 50 mg by mouth daily.    ? fenofibrate (TRICOR) 48 MG tablet Take 48 mg by mouth daily.    ? gabapentin (NEURONTIN) 100 MG capsule Take by mouth.    ? hydrochlorothiazide (HYDRODIURIL) 25 MG tablet Take 25 mg by mouth daily.    ? HYDROcodone-acetaminophen (NORCO) 10-325 MG tablet Take 1 tablet by mouth every 4 (four) hours as needed for moderate pain.    ? latanoprost (XALATAN) 0.005 % ophthalmic solution Place 1 drop into both eyes every other day. At night    ?  meloxicam (MOBIC) 15 MG tablet Take 15 mg by mouth daily.    ? olmesartan (BENICAR) 40 MG tablet Take 40 mg by mouth daily.    ? omeprazole (PRILOSEC) 40 MG capsule Take 40 mg by mouth daily.    ? phenytoin (DILANTIN) 100 MG ER capsule Take 300 mg by mouth daily.    ? potassium chloride SA (KLOR-CON M) 20 MEQ tablet TAKE 1 TABLET BY MOUTH ONCE DAILY 30 tablet 1  ? predniSONE (DELTASONE) 5 MG tablet TAKE 1 TABLET BY MOUTH ONCE A DAY WITH BREAKFAST 90 tablet 3  ? predniSONE (DELTASONE) 5 MG tablet TAKE 1 TABLET BY MOUTH ONCE A DAY WITH BREAKFAST 90 tablet 3  ? verapamil (VERELAN PM) 360 MG 24 hr capsule Take 360 mg by mouth daily.    ? ?No current facility-administered medications for this visit.  ? ? ?REVIEW OF SYSTEMS:   ?Constitutional: ( - ) fevers, ( - )  chills , ( - ) night sweats ?Eyes: ( - ) blurriness of vision, ( - ) double vision, ( - ) watery eyes ?Ears, nose, mouth, throat, and face: ( - ) mucositis, ( - ) sore throat ?Respiratory: ( - ) cough, ( - ) dyspnea, ( - ) wheezes ?Cardiovascular: ( - ) palpitation, ( - ) chest discomfort, ( - ) lower extremity swelling ?Gastrointestinal:  ( - ) nausea, ( - ) heartburn, ( - ) change in bowel habits ?Skin: ( - ) abnormal skin rashes ?Lymphatics: ( - ) new lymphadenopathy, ( - ) easy bruising ?Neurological: ( - ) numbness, ( - ) tingling, ( - ) new weaknesses ?Behavioral/Psych: ( - ) mood change, ( - ) new changes  ?All other systems were reviewed with the patient and are negative. ? ?PHYSICAL EXAMINATION: ?ECOG PERFORMANCE STATUS: 2 - Symptomatic, <50% confined to bed ? ?Vitals:  ? 12/16/21 1448  ?BP: (!) 173/97  ?Pulse: 88  ?Resp: 18  ?Temp: 97.9 ?F (36.6 ?C)  ?SpO2: 100%  ? ? ?Filed Weights  ? 12/16/21 1448  ?Weight: 160 lb (72.6 kg)  ? ?Physical Exam ?Constitutional:   ?   General: He is not in acute distress. ?HENT:  ?   Head: Normocephalic and atraumatic.  ?Cardiovascular:  ?   Rate and Rhythm: Normal rate and regular rhythm.  ?   Pulses: Normal pulses.  ?    Heart sounds: Normal heart sounds.  ?Pulmonary:  ?   Effort: Pulmonary effort is normal.  ?   Breath sounds: Normal breath sounds. No wheezing.  ?Abdominal:  ?   General: Abdomen is flat. Bowel sounds are normal. There is  no distension.  ?   Palpations: There is no mass.  ?   Tenderness: There is no abdominal tenderness.  ?Musculoskeletal:     ?   General: No swelling or tenderness.  ?Neurological:  ?   General: No focal deficit present.  ?   Mental Status: He is alert.  ?Psychiatric:     ?   Mood and Affect: Mood normal.  ? ? ? ?LABORATORY DATA:  ?I have reviewed the data as listed ? ?  Latest Ref Rng & Units 10/15/2021  ? 12:28 PM 09/17/2021  ?  3:03 PM 08/20/2021  ?  2:37 PM  ?CBC  ?WBC 4.0 - 10.5 K/uL 8.6   8.9   7.7    ?Hemoglobin 13.0 - 17.0 g/dL 9.3   9.3   8.3    ?Hematocrit 39.0 - 52.0 % 28.0   28.3   25.4    ?Platelets 150 - 400 K/uL 277   292   308    ? ? ? ?  Latest Ref Rng & Units 10/15/2021  ? 12:28 PM 09/17/2021  ?  3:03 PM 08/20/2021  ?  2:37 PM  ?CMP  ?Glucose 70 - 99 mg/dL 126   106   129    ?BUN 8 - 23 mg/dL '17   14   11    '$ ?Creatinine 0.61 - 1.24 mg/dL 0.95   0.88   0.99    ?Sodium 135 - 145 mmol/L 139   138   142    ?Potassium 3.5 - 5.1 mmol/L 3.2   3.7   2.7    ?Chloride 98 - 111 mmol/L 104   106   104    ?CO2 22 - 32 mmol/L '27   24   27    '$ ?Calcium 8.9 - 10.3 mg/dL 9.0   9.1   8.6    ?Total Protein 6.5 - 8.1 g/dL 7.3   7.8   7.0    ?Total Bilirubin 0.3 - 1.2 mg/dL 0.4   0.5   0.2    ?Alkaline Phos 38 - 126 U/L 68   66   69    ?AST 15 - 41 U/L '10   12   11    '$ ?ALT 0 - 44 U/L <5   8   <5    ? ?RADIOGRAPHIC STUDIES: ?No results found. ? ?ASSESSMENT & PLAN ?Joseph Rangel 76 y.o. male with medical history significant for metastatic prostate cancer who presents for a follow up visit. ? ?PMH significant for prostate adenocarcinoma Gleason 3+4, no LN involvement s.p robotic prostatectomy in 2013, presented with peri aortic left iliac and peri rectal LN, with biopsy confirming metastatic prostate adenoca  now on zytiga and lupron presents here for FU on his metastatic prostate cancer. ?Baseline PSA of 207. ? ? ?# Metastatic Castrate Sensitive Prostate Cancer ?-- continue on Zytiga '1000mg'$  PO daily with

## 2021-12-17 LAB — PSA, TOTAL AND FREE
PSA, Free Pct: UNDETERMINED %
PSA, Free: <0.02 ng/mL
Prostate Specific Ag, Serum: <0.1 ng/mL (ref 0.0–4.0)

## 2021-12-18 ENCOUNTER — Other Ambulatory Visit: Payer: Self-pay | Admitting: *Deleted

## 2021-12-18 DIAGNOSIS — Z79899 Other long term (current) drug therapy: Secondary | ICD-10-CM | POA: Diagnosis not present

## 2021-12-18 MED ORDER — ABIRATERONE ACETATE 250 MG PO TABS: 1000.0000 mg | ORAL_TABLET | Freq: Every day | ORAL | 0 refills | Status: DC

## 2021-12-22 ENCOUNTER — Other Ambulatory Visit: Payer: Self-pay | Admitting: *Deleted

## 2021-12-31 ENCOUNTER — Other Ambulatory Visit: Payer: Self-pay | Admitting: *Deleted

## 2021-12-31 MED ORDER — PREDNISONE 5 MG PO TABS: ORAL_TABLET | Freq: Every day | ORAL | 3 refills | Status: DC

## 2022-01-01 ENCOUNTER — Encounter: Payer: Self-pay | Admitting: Hematology and Oncology

## 2022-01-05 ENCOUNTER — Inpatient Hospital Stay: Payer: Medicare HMO | Attending: Hematology and Oncology

## 2022-01-05 ENCOUNTER — Other Ambulatory Visit: Payer: Self-pay

## 2022-01-05 VITALS — BP 176/90 | HR 79 | Temp 98.9°F | Resp 18

## 2022-01-05 DIAGNOSIS — Z79818 Long term (current) use of other agents affecting estrogen receptors and estrogen levels: Secondary | ICD-10-CM | POA: Diagnosis not present

## 2022-01-05 DIAGNOSIS — C61 Malignant neoplasm of prostate: Secondary | ICD-10-CM | POA: Insufficient documentation

## 2022-01-05 DIAGNOSIS — C772 Secondary and unspecified malignant neoplasm of intra-abdominal lymph nodes: Secondary | ICD-10-CM

## 2022-01-05 MED ORDER — LEUPROLIDE ACETATE (3 MONTH) 22.5 MG ~~LOC~~ KIT
22.5000 mg | PACK | Freq: Once | SUBCUTANEOUS | Status: AC
  Administered 2022-01-05: 22.5 mg via SUBCUTANEOUS
  Filled 2022-01-05: qty 22.5

## 2022-01-12 DIAGNOSIS — I10 Essential (primary) hypertension: Secondary | ICD-10-CM | POA: Diagnosis not present

## 2022-01-12 DIAGNOSIS — N390 Urinary tract infection, site not specified: Secondary | ICD-10-CM | POA: Diagnosis not present

## 2022-01-18 DIAGNOSIS — E7849 Other hyperlipidemia: Secondary | ICD-10-CM | POA: Diagnosis not present

## 2022-01-18 DIAGNOSIS — I10 Essential (primary) hypertension: Secondary | ICD-10-CM | POA: Diagnosis not present

## 2022-01-21 ENCOUNTER — Other Ambulatory Visit: Payer: Self-pay | Admitting: *Deleted

## 2022-01-21 MED ORDER — ABIRATERONE ACETATE 250 MG PO TABS: 1000.0000 mg | ORAL_TABLET | Freq: Every day | ORAL | 0 refills | Status: DC

## 2022-01-23 DIAGNOSIS — M545 Low back pain, unspecified: Secondary | ICD-10-CM | POA: Diagnosis not present

## 2022-01-23 DIAGNOSIS — J209 Acute bronchitis, unspecified: Secondary | ICD-10-CM | POA: Diagnosis not present

## 2022-01-23 DIAGNOSIS — H6121 Impacted cerumen, right ear: Secondary | ICD-10-CM | POA: Diagnosis not present

## 2022-01-23 DIAGNOSIS — Z79899 Other long term (current) drug therapy: Secondary | ICD-10-CM | POA: Diagnosis not present

## 2022-01-23 DIAGNOSIS — R030 Elevated blood-pressure reading, without diagnosis of hypertension: Secondary | ICD-10-CM | POA: Diagnosis not present

## 2022-01-23 DIAGNOSIS — C61 Malignant neoplasm of prostate: Secondary | ICD-10-CM | POA: Diagnosis not present

## 2022-01-23 DIAGNOSIS — I739 Peripheral vascular disease, unspecified: Secondary | ICD-10-CM | POA: Diagnosis not present

## 2022-01-29 DIAGNOSIS — Z79899 Other long term (current) drug therapy: Secondary | ICD-10-CM | POA: Diagnosis not present

## 2022-02-05 ENCOUNTER — Other Ambulatory Visit: Payer: Self-pay | Admitting: Hematology and Oncology

## 2022-02-17 ENCOUNTER — Other Ambulatory Visit: Payer: Self-pay

## 2022-02-17 ENCOUNTER — Inpatient Hospital Stay: Payer: Medicare HMO | Admitting: Hematology and Oncology

## 2022-02-17 ENCOUNTER — Inpatient Hospital Stay: Payer: Medicare HMO | Attending: Hematology and Oncology

## 2022-02-17 ENCOUNTER — Encounter: Payer: Self-pay | Admitting: Hematology and Oncology

## 2022-02-17 VITALS — BP 172/88 | HR 88 | Temp 97.7°F | Resp 16 | Ht 69.0 in | Wt 164.3 lb

## 2022-02-17 DIAGNOSIS — F17210 Nicotine dependence, cigarettes, uncomplicated: Secondary | ICD-10-CM | POA: Diagnosis not present

## 2022-02-17 DIAGNOSIS — C772 Secondary and unspecified malignant neoplasm of intra-abdominal lymph nodes: Secondary | ICD-10-CM | POA: Insufficient documentation

## 2022-02-17 DIAGNOSIS — I10 Essential (primary) hypertension: Secondary | ICD-10-CM | POA: Insufficient documentation

## 2022-02-17 DIAGNOSIS — Z7952 Long term (current) use of systemic steroids: Secondary | ICD-10-CM | POA: Diagnosis not present

## 2022-02-17 DIAGNOSIS — C61 Malignant neoplasm of prostate: Secondary | ICD-10-CM

## 2022-02-17 LAB — CBC WITH DIFFERENTIAL/PLATELET
Abs Immature Granulocytes: 0.05 10*3/uL (ref 0.00–0.07)
Basophils Absolute: 0 10*3/uL (ref 0.0–0.1)
Basophils Relative: 0 %
Eosinophils Absolute: 0.1 10*3/uL (ref 0.0–0.5)
Eosinophils Relative: 1 %
HCT: 29.1 % — ABNORMAL LOW (ref 39.0–52.0)
Hemoglobin: 9.7 g/dL — ABNORMAL LOW (ref 13.0–17.0)
Immature Granulocytes: 1 %
Lymphocytes Relative: 16 %
Lymphs Abs: 1.5 10*3/uL (ref 0.7–4.0)
MCH: 29.5 pg (ref 26.0–34.0)
MCHC: 33.3 g/dL (ref 30.0–36.0)
MCV: 88.4 fL (ref 80.0–100.0)
Monocytes Absolute: 0.6 10*3/uL (ref 0.1–1.0)
Monocytes Relative: 6 %
Neutro Abs: 7.2 10*3/uL (ref 1.7–7.7)
Neutrophils Relative %: 76 %
Platelets: 282 10*3/uL (ref 150–400)
RBC: 3.29 MIL/uL — ABNORMAL LOW (ref 4.22–5.81)
RDW: 14.1 % (ref 11.5–15.5)
WBC: 9.5 10*3/uL (ref 4.0–10.5)
nRBC: 0 % (ref 0.0–0.2)

## 2022-02-17 LAB — COMPREHENSIVE METABOLIC PANEL
ALT: 5 U/L (ref 0–44)
AST: 11 U/L — ABNORMAL LOW (ref 15–41)
Albumin: 4.2 g/dL (ref 3.5–5.0)
Alkaline Phosphatase: 77 U/L (ref 38–126)
Anion gap: 7 (ref 5–15)
BUN: 13 mg/dL (ref 8–23)
CO2: 24 mmol/L (ref 22–32)
Calcium: 9.2 mg/dL (ref 8.9–10.3)
Chloride: 106 mmol/L (ref 98–111)
Creatinine, Ser: 1.04 mg/dL (ref 0.61–1.24)
GFR, Estimated: >60 mL/min (ref >60)
Glucose, Bld: 126 mg/dL — ABNORMAL HIGH (ref 70–99)
Potassium: 3.8 mmol/L (ref 3.5–5.1)
Sodium: 137 mmol/L (ref 135–145)
Total Bilirubin: 0.3 mg/dL (ref 0.3–1.2)
Total Protein: 7.5 g/dL (ref 6.5–8.1)

## 2022-02-17 NOTE — Progress Notes (Signed)
[place] [phone]   Fax:(336) [phone]  PROGRESS NOTE  Patient Care Team: Iona Beard, MD as PCP - General (Family Medicine)  Hematological/Oncological History  # Metastatic Prostate Cancer w/ Spread to Intraabdominal Lymph Nodes --previously followed by Dr. Chryl Heck. Currently on abiraterone therapy 06/18/2021: PSA <0.01  Interval History:   Joseph Rangel 76 y.o. male with medical history significant for metastatic prostate cancer who presents for a follow up visit. The patient's last visit was on 09/17/2021. In the interim since the last visit he has continued on abiraterone therapy.   On exam today Joseph Rangel presents today with his daughter and son-in-law. He has been taking his medications as prescribed.  He takes 4 tablets of Zytiga every day and prednisone daily.   He denies any new health complaints except for some pain in the left knee.  His right knee has gotten much better overall.  He walks at home with assistive device.  He denies any side effects from Zytiga or prednisone.  No new bone pains other than the left knee.  No change in urination.  Rest of the pertinent 10 point ROS reviewed and negative.  MEDICAL HISTORY:  Past Medical History:  Diagnosis Date   Arthritis    Cancer [facility])    prostate    Frequency of urination    Hypertension    Seizures ([place])    after brain surg - none in past 20 yrs   Speech disorder    due to brain surg    SURGICAL HISTORY: Past Surgical History:  Procedure Laterality Date   BRAIN SURGERY     BLOOD CLOT REMOVED 1980   MASS EXCISION  02/24/2012   Procedure: MINOR EXCISION OF MASS;  Surgeon: Bernestine Amass, MD;  Location: [facility];  Service: Urology;;  excision of neck lesion   ROBOT ASSISTED LAPAROSCOPIC RADICAL PROSTATECTOMY  02/24/2012   Procedure: ROBOTIC ASSISTED LAPAROSCOPIC RADICAL PROSTATECTOMY;  Surgeon: Bernestine Amass, MD;  Location: [facility];  Service: Urology;  Laterality: N/A;            SOCIAL HISTORY: Social History   Socioeconomic History   Marital status: Married    Spouse name: Not on file   Number of children: Not on file   Years of education: Not on file   Highest education level: Not on file  Occupational History   Not on file  Tobacco Use   Smoking status: Every Day    Types: Cigarettes   Smokeless tobacco: Never   Tobacco comments:    2 cigarettes per day  Vaping Use   Vaping Use: Never used  Substance and Sexual Activity   Alcohol use: No   Drug use: No   Sexual activity: Not on file  Other Topics Concern   Not on file  Social History Narrative   Not on file   Social Determinants of Health   Financial Resource Strain: Not on file  Food Insecurity: Not on file  Transportation Needs: Not on file  Physical Activity: Not on file  Stress: Not on file  Social Connections: Not on file  Intimate Partner Violence: Not on file    FAMILY HISTORY: No family history on file.  ALLERGIES:  has No Known Allergies.  MEDICATIONS:  Current Outpatient Medications  Medication Sig Dispense Refill   abiraterone acetate (ZYTIGA) 250 MG tablet TAKE 4 TABLETS (1,000 MG TOTAL) BY MOUTH DAILY. TAKE ON AN EMPTY STOMACH 1 HOUR BEFORE OR 2 HOURS AFTER A MEAL  120 tablet 0   atenolol (TENORMIN) 25 MG tablet Take 25 mg by mouth daily.     buprenorphine (BUTRANS) 7.5 MCG/HR  (Patient not taking: Reported on 08/20/2021)     cilostazol (PLETAL) 50 MG tablet Take 50 mg by mouth daily.     fenofibrate (TRICOR) 48 MG tablet Take 48 mg by mouth daily.     gabapentin (NEURONTIN) 100 MG capsule Take by mouth.     hydrochlorothiazide (HYDRODIURIL) 25 MG tablet Take 25 mg by mouth daily.     HYDROcodone-acetaminophen (NORCO) 10-325 MG tablet Take 1 tablet by mouth every 4 (four) hours as needed for moderate pain.     latanoprost (XALATAN) 0.005 % ophthalmic solution Place 1 drop into both eyes every other day. At night     meloxicam (MOBIC) 15 MG tablet Take 15 mg by  mouth daily.     olmesartan (BENICAR) 40 MG tablet Take 40 mg by mouth daily.     omeprazole (PRILOSEC) 40 MG capsule Take 40 mg by mouth daily.     phenytoin (DILANTIN) 100 MG ER capsule Take 300 mg by mouth daily.     potassium chloride SA (KLOR-CON M) 20 MEQ tablet TAKE 1 TABLET BY MOUTH ONCE DAILY 30 tablet 1   predniSONE (DELTASONE) 5 MG tablet TAKE 1 TABLET BY MOUTH ONCE A DAY WITH BREAKFAST 90 tablet 3   verapamil (VERELAN PM) 360 MG 24 hr capsule Take 360 mg by mouth daily.     No current facility-administered medications for this visit.    REVIEW OF SYSTEMS:   Constitutional: ( - ) fevers, ( - )  chills , ( - ) night sweats Eyes: ( - ) blurriness of vision, ( - ) double vision, ( - ) watery eyes Ears, nose, mouth, throat, and face: ( - ) mucositis, ( - ) sore throat Respiratory: ( - ) cough, ( - ) dyspnea, ( - ) wheezes Cardiovascular: ( - ) palpitation, ( - ) chest discomfort, ( - ) lower extremity swelling Gastrointestinal:  ( - ) nausea, ( - ) heartburn, ( - ) change in bowel habits Skin: ( - ) abnormal skin rashes Lymphatics: ( - ) new lymphadenopathy, ( - ) easy bruising Neurological: ( - ) numbness, ( - ) tingling, ( - ) new weaknesses Behavioral/Psych: ( - ) mood change, ( - ) new changes  All other systems were reviewed with the patient and are negative.  PHYSICAL EXAMINATION: ECOG PERFORMANCE STATUS: 2 - Symptomatic, <50% confined to bed  Vitals:   02/17/22 1532  BP: (!) 172/88  Pulse: 88  Resp: 16  Temp: 97.7 F (36.5 C)  SpO2: 98%    Filed Weights   02/17/22 1532  Weight: 164 lb 4.8 oz (74.5 kg)   Physical Exam Constitutional:      General: He is not in acute distress.    Appearance: Normal appearance.     Comments: He is in a wheelchair  HENT:     Head: Normocephalic and atraumatic.  Cardiovascular:     Rate and Rhythm: Normal rate and regular rhythm.     Pulses: Normal pulses.     Heart sounds: Normal heart sounds.  Pulmonary:     Effort:  Pulmonary effort is normal.     Breath sounds: Normal breath sounds. No wheezing.  Abdominal:     General: Abdomen is flat. Bowel sounds are normal. There is no distension.     Palpations: There is no mass.  Tenderness: There is no abdominal tenderness.  Musculoskeletal:        General: No swelling or tenderness.  Neurological:     General: No focal deficit present.     Mental Status: He is alert.  Psychiatric:        Mood and Affect: Mood normal.     LABORATORY DATA:  I have reviewed the data as listed    Latest Ref Rng & Units 02/17/2022    3:14 PM 12/16/2021    3:35 PM 10/15/2021   12:28 PM  CBC  WBC 4.0 - 10.5 K/uL 9.5   9.0   8.6    Hemoglobin 13.0 - 17.0 g/dL 9.7   9.6   9.3    Hematocrit 39.0 - 52.0 % 29.1   29.6   28.0    Platelets 150 - 400 K/uL 282   225   277         Latest Ref Rng & Units 12/16/2021    3:35 PM 10/15/2021   12:28 PM 09/17/2021    3:03 PM  CMP  Glucose 70 - 99 mg/dL 154   126   106    BUN 8 - 23 mg/dL '14   17   14    '$ Creatinine 0.61 - 1.24 mg/dL 1.04   0.95   0.88    Sodium 135 - 145 mmol/L 140   139   138    Potassium 3.5 - 5.1 mmol/L 3.8   3.2   3.7    Chloride 98 - 111 mmol/L 108   104   106    CO2 22 - 32 mmol/L '24   27   24    '$ Calcium 8.9 - 10.3 mg/dL 8.8   9.0   9.1    Total Protein 6.5 - 8.1 g/dL 7.3   7.3   7.8    Total Bilirubin 0.3 - 1.2 mg/dL 0.3   0.4   0.5    Alkaline Phos 38 - 126 U/L 72   68   66    AST 15 - 41 U/L '11   10   12    '$ ALT 0 - 44 U/L 6   <5   8     RADIOGRAPHIC STUDIES: No results found.  ASSESSMENT & PLAN Joseph Rangel 76 y.o. male with medical history significant for metastatic prostate cancer who presents for a follow up visit.  PMH significant for prostate adenocarcinoma Gleason 3+4, no LN involvement s.p robotic prostatectomy in 2013, presented with peri aortic left iliac and peri rectal LN, with biopsy confirming metastatic prostate adenoca now on zytiga and lupron presents here for FU on his metastatic  prostate cancer. Baseline PSA of 207.   # Metastatic Castrate Sensitive Prostate Cancer -- continue on Zytiga '1000mg'$  PO daily with prednisone --Next dose of Lupron due on 03/26/2022 -- last PSA in March less than 0.02 ---  ---He should continue on Zytiga with prednisone and repeat labs today.  CBC and CMP reviewed, no evidence of transaminitis.  PSA pending from today --- He did not have any bone metastasis hence he is not on bisphosphonate therapy. --I do not believe the left knee pain is related to his prostate cancer.  He should check in with his orthopedic physician about management of this.  Advises to the daughter as well.  No orders of the defined types were placed in this encounter.  All questions were answered. The patient knows to call the clinic with any problems,  questions or concerns.  A total of more than 20 minutes were spent on this encounter with face-to-face time and non-face-to-face time, including preparing to see the patient, ordering tests and/or medications, counseling the patient and coordination of care as outlined above.   Benay Pike MD   02/17/2022 3:36 PM

## 2022-02-18 ENCOUNTER — Telehealth: Payer: Self-pay | Admitting: Hematology and Oncology

## 2022-02-18 ENCOUNTER — Other Ambulatory Visit: Payer: Self-pay | Admitting: *Deleted

## 2022-02-18 DIAGNOSIS — E7849 Other hyperlipidemia: Secondary | ICD-10-CM | POA: Diagnosis not present

## 2022-02-18 DIAGNOSIS — I10 Essential (primary) hypertension: Secondary | ICD-10-CM | POA: Diagnosis not present

## 2022-02-18 LAB — PSA, TOTAL AND FREE
PSA, Free Pct: UNDETERMINED %
PSA, Free: <0.02 ng/mL
Prostate Specific Ag, Serum: <0.1 ng/mL (ref 0.0–4.0)

## 2022-02-18 MED ORDER — ABIRATERONE ACETATE 250 MG PO TABS: 1000.0000 mg | ORAL_TABLET | Freq: Every day | ORAL | 0 refills | Status: DC

## 2022-02-18 NOTE — Telephone Encounter (Signed)
Added some extra appointments to patients calender per updated los. Patient aware.

## 2022-03-10 DIAGNOSIS — M150 Primary generalized (osteo)arthritis: Secondary | ICD-10-CM | POA: Diagnosis not present

## 2022-03-10 DIAGNOSIS — R569 Unspecified convulsions: Secondary | ICD-10-CM | POA: Diagnosis not present

## 2022-03-10 DIAGNOSIS — I7389 Other specified peripheral vascular diseases: Secondary | ICD-10-CM | POA: Diagnosis not present

## 2022-03-10 DIAGNOSIS — Z6825 Body mass index (BMI) 25.0-25.9, adult: Secondary | ICD-10-CM | POA: Diagnosis not present

## 2022-03-10 DIAGNOSIS — E781 Pure hyperglyceridemia: Secondary | ICD-10-CM | POA: Diagnosis not present

## 2022-03-10 DIAGNOSIS — I10 Essential (primary) hypertension: Secondary | ICD-10-CM | POA: Diagnosis not present

## 2022-03-10 DIAGNOSIS — E785 Hyperlipidemia, unspecified: Secondary | ICD-10-CM | POA: Diagnosis not present

## 2022-03-10 DIAGNOSIS — Z720 Tobacco use: Secondary | ICD-10-CM | POA: Diagnosis not present

## 2022-03-10 DIAGNOSIS — C61 Malignant neoplasm of prostate: Secondary | ICD-10-CM | POA: Diagnosis not present

## 2022-03-10 DIAGNOSIS — Z0000 Encounter for general adult medical examination without abnormal findings: Secondary | ICD-10-CM | POA: Diagnosis not present

## 2022-03-17 DIAGNOSIS — Z79899 Other long term (current) drug therapy: Secondary | ICD-10-CM | POA: Diagnosis not present

## 2022-03-17 DIAGNOSIS — R030 Elevated blood-pressure reading, without diagnosis of hypertension: Secondary | ICD-10-CM | POA: Diagnosis not present

## 2022-03-17 DIAGNOSIS — H6121 Impacted cerumen, right ear: Secondary | ICD-10-CM | POA: Diagnosis not present

## 2022-03-17 DIAGNOSIS — M545 Low back pain, unspecified: Secondary | ICD-10-CM | POA: Diagnosis not present

## 2022-03-17 DIAGNOSIS — M542 Cervicalgia: Secondary | ICD-10-CM | POA: Diagnosis not present

## 2022-03-17 DIAGNOSIS — I739 Peripheral vascular disease, unspecified: Secondary | ICD-10-CM | POA: Diagnosis not present

## 2022-03-18 ENCOUNTER — Other Ambulatory Visit: Payer: Self-pay

## 2022-03-18 ENCOUNTER — Inpatient Hospital Stay: Payer: Medicare HMO | Attending: Hematology and Oncology

## 2022-03-18 DIAGNOSIS — C61 Malignant neoplasm of prostate: Secondary | ICD-10-CM | POA: Insufficient documentation

## 2022-03-18 DIAGNOSIS — C772 Secondary and unspecified malignant neoplasm of intra-abdominal lymph nodes: Secondary | ICD-10-CM | POA: Diagnosis present

## 2022-03-18 LAB — CBC WITH DIFFERENTIAL/PLATELET
Abs Immature Granulocytes: 0.06 10*3/uL (ref 0.00–0.07)
Basophils Absolute: 0.1 10*3/uL (ref 0.0–0.1)
Basophils Relative: 1 %
Eosinophils Absolute: 0.2 10*3/uL (ref 0.0–0.5)
Eosinophils Relative: 2 %
HCT: 28.9 % — ABNORMAL LOW (ref 39.0–52.0)
Hemoglobin: 9.7 g/dL — ABNORMAL LOW (ref 13.0–17.0)
Immature Granulocytes: 1 %
Lymphocytes Relative: 19 %
Lymphs Abs: 2.2 10*3/uL (ref 0.7–4.0)
MCH: 29.2 pg (ref 26.0–34.0)
MCHC: 33.6 g/dL (ref 30.0–36.0)
MCV: 87 fL (ref 80.0–100.0)
Monocytes Absolute: 0.9 10*3/uL (ref 0.1–1.0)
Monocytes Relative: 8 %
Neutro Abs: 8.2 10*3/uL — ABNORMAL HIGH (ref 1.7–7.7)
Neutrophils Relative %: 69 %
Platelets: 276 10*3/uL (ref 150–400)
RBC: 3.32 MIL/uL — ABNORMAL LOW (ref 4.22–5.81)
RDW: 13.3 % (ref 11.5–15.5)
WBC: 11.6 10*3/uL — ABNORMAL HIGH (ref 4.0–10.5)
nRBC: 0 % (ref 0.0–0.2)

## 2022-03-19 LAB — PSA, TOTAL AND FREE
PSA, Free Pct: UNDETERMINED %
PSA, Free: <0.02 ng/mL
Prostate Specific Ag, Serum: <0.1 ng/mL (ref 0.0–4.0)

## 2022-03-23 DIAGNOSIS — Z79899 Other long term (current) drug therapy: Secondary | ICD-10-CM | POA: Diagnosis not present

## 2022-04-01 ENCOUNTER — Telehealth: Payer: Self-pay | Admitting: *Deleted

## 2022-04-01 NOTE — Telephone Encounter (Signed)
Pt left VM stating " I need a refill and the pharmacy said I had to get it from you "  Return call number given as [phone].  This RN called pt and spoke with family member who states pt just left.   This RN requested another call from the pt but that he needs to leave the name of needed medication.

## 2022-04-14 DIAGNOSIS — N1831 Chronic kidney disease, stage 3a: Secondary | ICD-10-CM | POA: Diagnosis not present

## 2022-04-14 DIAGNOSIS — N183 Chronic kidney disease, stage 3 unspecified: Secondary | ICD-10-CM | POA: Diagnosis not present

## 2022-04-14 DIAGNOSIS — M545 Low back pain, unspecified: Secondary | ICD-10-CM | POA: Diagnosis not present

## 2022-04-14 DIAGNOSIS — C61 Malignant neoplasm of prostate: Secondary | ICD-10-CM | POA: Diagnosis not present

## 2022-04-14 DIAGNOSIS — Z79899 Other long term (current) drug therapy: Secondary | ICD-10-CM | POA: Diagnosis not present

## 2022-04-14 DIAGNOSIS — R030 Elevated blood-pressure reading, without diagnosis of hypertension: Secondary | ICD-10-CM | POA: Diagnosis not present

## 2022-04-14 DIAGNOSIS — I739 Peripheral vascular disease, unspecified: Secondary | ICD-10-CM | POA: Diagnosis not present

## 2022-04-14 DIAGNOSIS — M542 Cervicalgia: Secondary | ICD-10-CM | POA: Diagnosis not present

## 2022-04-14 DIAGNOSIS — I10 Essential (primary) hypertension: Secondary | ICD-10-CM | POA: Diagnosis not present

## 2022-04-14 DIAGNOSIS — H6121 Impacted cerumen, right ear: Secondary | ICD-10-CM | POA: Diagnosis not present

## 2022-04-15 ENCOUNTER — Inpatient Hospital Stay: Payer: Medicare HMO | Attending: Hematology and Oncology

## 2022-04-15 ENCOUNTER — Inpatient Hospital Stay (HOSPITAL_BASED_OUTPATIENT_CLINIC_OR_DEPARTMENT_OTHER): Payer: Medicare HMO | Admitting: Hematology and Oncology

## 2022-04-15 ENCOUNTER — Encounter: Payer: Self-pay | Admitting: Hematology and Oncology

## 2022-04-15 ENCOUNTER — Other Ambulatory Visit: Payer: Self-pay

## 2022-04-15 VITALS — BP 133/74 | HR 84 | Temp 97.5°F | Resp 18 | Ht 69.0 in | Wt 163.1 lb

## 2022-04-15 DIAGNOSIS — Z7952 Long term (current) use of systemic steroids: Secondary | ICD-10-CM | POA: Diagnosis not present

## 2022-04-15 DIAGNOSIS — C772 Secondary and unspecified malignant neoplasm of intra-abdominal lymph nodes: Secondary | ICD-10-CM | POA: Diagnosis not present

## 2022-04-15 DIAGNOSIS — Z79899 Other long term (current) drug therapy: Secondary | ICD-10-CM | POA: Insufficient documentation

## 2022-04-15 DIAGNOSIS — Z191 Hormone sensitive malignancy status: Secondary | ICD-10-CM | POA: Diagnosis not present

## 2022-04-15 DIAGNOSIS — R32 Unspecified urinary incontinence: Secondary | ICD-10-CM | POA: Insufficient documentation

## 2022-04-15 DIAGNOSIS — I10 Essential (primary) hypertension: Secondary | ICD-10-CM | POA: Insufficient documentation

## 2022-04-15 DIAGNOSIS — R590 Localized enlarged lymph nodes: Secondary | ICD-10-CM | POA: Diagnosis not present

## 2022-04-15 DIAGNOSIS — Z791 Long term (current) use of non-steroidal anti-inflammatories (NSAID): Secondary | ICD-10-CM | POA: Diagnosis not present

## 2022-04-15 DIAGNOSIS — Z7902 Long term (current) use of antithrombotics/antiplatelets: Secondary | ICD-10-CM | POA: Diagnosis not present

## 2022-04-15 DIAGNOSIS — C61 Malignant neoplasm of prostate: Secondary | ICD-10-CM | POA: Insufficient documentation

## 2022-04-15 LAB — CBC WITH DIFFERENTIAL/PLATELET
Abs Immature Granulocytes: 0.03 10*3/uL (ref 0.00–0.07)
Basophils Absolute: 0.1 10*3/uL (ref 0.0–0.1)
Basophils Relative: 1 %
Eosinophils Absolute: 0.2 10*3/uL (ref 0.0–0.5)
Eosinophils Relative: 3 %
HCT: 27 % — ABNORMAL LOW (ref 39.0–52.0)
Hemoglobin: 9 g/dL — ABNORMAL LOW (ref 13.0–17.0)
Immature Granulocytes: 0 %
Lymphocytes Relative: 23 %
Lymphs Abs: 1.7 10*3/uL (ref 0.7–4.0)
MCH: 29.2 pg (ref 26.0–34.0)
MCHC: 33.3 g/dL (ref 30.0–36.0)
MCV: 87.7 fL (ref 80.0–100.0)
Monocytes Absolute: 0.5 10*3/uL (ref 0.1–1.0)
Monocytes Relative: 6 %
Neutro Abs: 5 10*3/uL (ref 1.7–7.7)
Neutrophils Relative %: 67 %
Platelets: 264 10*3/uL (ref 150–400)
RBC: 3.08 MIL/uL — ABNORMAL LOW (ref 4.22–5.81)
RDW: 13.5 % (ref 11.5–15.5)
WBC: 7.4 10*3/uL (ref 4.0–10.5)
nRBC: 0 % (ref 0.0–0.2)

## 2022-04-15 LAB — COMPREHENSIVE METABOLIC PANEL
ALT: 8 U/L (ref 0–44)
AST: 15 U/L (ref 15–41)
Albumin: 3.9 g/dL (ref 3.5–5.0)
Alkaline Phosphatase: 66 U/L (ref 38–126)
Anion gap: 9 (ref 5–15)
BUN: 18 mg/dL (ref 8–23)
CO2: 23 mmol/L (ref 22–32)
Calcium: 8.9 mg/dL (ref 8.9–10.3)
Chloride: 108 mmol/L (ref 98–111)
Creatinine, Ser: 1.15 mg/dL (ref 0.61–1.24)
GFR, Estimated: >60 mL/min (ref >60)
Glucose, Bld: 114 mg/dL — ABNORMAL HIGH (ref 70–99)
Potassium: 3.5 mmol/L (ref 3.5–5.1)
Sodium: 140 mmol/L (ref 135–145)
Total Bilirubin: 0.3 mg/dL (ref 0.3–1.2)
Total Protein: 7.7 g/dL (ref 6.5–8.1)

## 2022-04-15 MED ORDER — ABIRATERONE ACETATE 250 MG PO TABS: 1000.0000 mg | ORAL_TABLET | Freq: Every day | ORAL | 2 refills | Status: DC

## 2022-04-15 NOTE — Progress Notes (Signed)
[place] Telephone:(336) [phone]   Fax:(336) [phone]  PROGRESS NOTE  Patient Care Team: Iona Beard, MD as PCP - General (Family Medicine)  Hematological/Oncological History  # Metastatic Prostate Cancer w/ Spread to Intraabdominal Lymph Nodes --previously followed by Dr. Chryl Heck. Currently on abiraterone therapy 06/18/2021: PSA <0.01  Interval History:   Joseph Rangel 76 y.o. male with medical history significant for metastatic prostate cancer who presents for a follow up visit. The patient's last visit was on 09/17/2021. In the interim since the last visit he has continued on abiraterone therapy.   On exam today Joseph Rangel presents today by himself.  Since his last visit, he denies any new health complaints.  No change in urination, has baseline urinary incontinence.  He ran out of Zytiga about a week ago and did try to obtain a new prescription but he mentions that pharmacy is unable to reach out to our office and hence he could not take it for the past week.  He however remains compliant with prednisone daily as well as his other medications.  No new bone pains.  Rest of the pertinent 10 point ROS reviewed and negative   MEDICAL HISTORY:  Past Medical History:  Diagnosis Date   Arthritis    Cancer ([place])    prostate    Frequency of urination    Hypertension    Seizures ([place])    after brain surg - none in past 20 yrs   Speech disorder    due to brain surg    SURGICAL HISTORY: Past Surgical History:  Procedure Laterality Date   BRAIN SURGERY     BLOOD CLOT REMOVED 1980   MASS EXCISION  02/24/2012   Procedure: MINOR EXCISION OF MASS;  Surgeon: Bernestine Amass, MD;  Location: [facility];  Service: Urology;;  excision of neck lesion   ROBOT ASSISTED LAPAROSCOPIC RADICAL PROSTATECTOMY  02/24/2012   Procedure: ROBOTIC ASSISTED LAPAROSCOPIC RADICAL PROSTATECTOMY;  Surgeon: Bernestine Amass, MD;  Location: [facility];  Service: Urology;  Laterality: N/A;            SOCIAL HISTORY: Social History   Socioeconomic History   Marital status: Married    Spouse name: Not on file   Number of children: Not on file   Years of education: Not on file   Highest education level: Not on file  Occupational History   Not on file  Tobacco Use   Smoking status: Every Day    Types: Cigarettes   Smokeless tobacco: Never   Tobacco comments:    2 cigarettes per day  Vaping Use   Vaping Use: Never used  Substance and Sexual Activity   Alcohol use: No   Drug use: No   Sexual activity: Not on file  Other Topics Concern   Not on file  Social History Narrative   Not on file   Social Determinants of Health   Financial Resource Strain: Not on file  Food Insecurity: Not on file  Transportation Needs: Not on file  Physical Activity: Not on file  Stress: Not on file  Social Connections: Not on file  Intimate Partner Violence: Not on file    FAMILY HISTORY: No family history on file.  ALLERGIES:  has No Known Allergies.  MEDICATIONS:  Current Outpatient Medications  Medication Sig Dispense Refill   abiraterone acetate (ZYTIGA) 250 MG tablet TAKE 4 TABLETS (1,000 MG TOTAL) BY MOUTH DAILY. TAKE ON AN EMPTY STOMACH 1 HOUR BEFORE OR 2 HOURS AFTER A  MEAL 120 tablet 2   atenolol (TENORMIN) 25 MG tablet Take 25 mg by mouth daily.     buprenorphine (BUTRANS) 7.5 MCG/HR  (Patient not taking: Reported on 08/20/2021)     cilostazol (PLETAL) 50 MG tablet Take 50 mg by mouth daily.     fenofibrate (TRICOR) 48 MG tablet Take 48 mg by mouth daily.     gabapentin (NEURONTIN) 100 MG capsule Take by mouth.     hydrochlorothiazide (HYDRODIURIL) 25 MG tablet Take 25 mg by mouth daily.     HYDROcodone-acetaminophen (NORCO) 10-325 MG tablet Take 1 tablet by mouth every 4 (four) hours as needed for moderate pain.     latanoprost (XALATAN) 0.005 % ophthalmic solution Place 1 drop into both eyes every other day. At night     meloxicam (MOBIC) 15 MG tablet Take 15 mg by  mouth daily.     olmesartan (BENICAR) 40 MG tablet Take 40 mg by mouth daily.     omeprazole (PRILOSEC) 40 MG capsule Take 40 mg by mouth daily.     phenytoin (DILANTIN) 100 MG ER capsule Take 300 mg by mouth daily.     potassium chloride SA (KLOR-CON M) 20 MEQ tablet TAKE 1 TABLET BY MOUTH ONCE DAILY 30 tablet 1   predniSONE (DELTASONE) 5 MG tablet TAKE 1 TABLET BY MOUTH ONCE A DAY WITH BREAKFAST 90 tablet 3   verapamil (VERELAN PM) 360 MG 24 hr capsule Take 360 mg by mouth daily.     No current facility-administered medications for this visit.    REVIEW OF SYSTEMS:   Constitutional: ( - ) fevers, ( - )  chills , ( - ) night sweats Eyes: ( - ) blurriness of vision, ( - ) double vision, ( - ) watery eyes Ears, nose, mouth, throat, and face: ( - ) mucositis, ( - ) sore throat Respiratory: ( - ) cough, ( - ) dyspnea, ( - ) wheezes Cardiovascular: ( - ) palpitation, ( - ) chest discomfort, ( - ) lower extremity swelling Gastrointestinal:  ( - ) nausea, ( - ) heartburn, ( - ) change in bowel habits Skin: ( - ) abnormal skin rashes Lymphatics: ( - ) new lymphadenopathy, ( - ) easy bruising Neurological: ( - ) numbness, ( - ) tingling, ( - ) new weaknesses Behavioral/Psych: ( - ) mood change, ( - ) new changes  All other systems were reviewed with the patient and are negative.  PHYSICAL EXAMINATION: ECOG PERFORMANCE STATUS: 2 - Symptomatic, <50% confined to bed  Vitals:   04/15/22 1540  BP: 133/74  Pulse: 84  Resp: 18  Temp: (!) 97.5 F (36.4 C)  SpO2: 99%    Filed Weights   04/15/22 1540  Weight: 163 lb 1.6 oz (74 kg)   Physical Exam Constitutional:      General: He is not in acute distress.    Appearance: Normal appearance.     Comments: He is in a wheelchair  HENT:     Head: Normocephalic and atraumatic.  Cardiovascular:     Rate and Rhythm: Normal rate and regular rhythm.     Pulses: Normal pulses.     Heart sounds: Normal heart sounds.  Pulmonary:     Effort:  Pulmonary effort is normal.     Breath sounds: Normal breath sounds. No wheezing.  Abdominal:     General: Abdomen is flat. Bowel sounds are normal. There is no distension.     Palpations: There is no mass.  Tenderness: There is no abdominal tenderness.  Musculoskeletal:        General: No swelling or tenderness.  Neurological:     General: No focal deficit present.     Mental Status: He is alert.  Psychiatric:        Mood and Affect: Mood normal.      LABORATORY DATA:  I have reviewed the data as listed    Latest Ref Rng & Units 04/15/2022    3:09 PM 03/18/2022    3:39 PM 02/17/2022    3:14 PM  CBC  WBC 4.0 - 10.5 K/uL 7.4  11.6  9.5   Hemoglobin 13.0 - 17.0 g/dL 9.0  9.7  9.7   Hematocrit 39.0 - 52.0 % 27.0  28.9  29.1   Platelets 150 - 400 K/uL 264  276  282        Latest Ref Rng & Units 04/15/2022    3:09 PM 02/17/2022    3:14 PM 12/16/2021    3:35 PM  CMP  Glucose 70 - 99 mg/dL 114  126  154   BUN 8 - 23 mg/dL '18  13  14   '$ Creatinine 0.61 - 1.24 mg/dL 1.15  1.04  1.04   Sodium 135 - 145 mmol/L 140  137  140   Potassium 3.5 - 5.1 mmol/L 3.5  3.8  3.8   Chloride 98 - 111 mmol/L 108  106  108   CO2 22 - 32 mmol/L '23  24  24   '$ Calcium 8.9 - 10.3 mg/dL 8.9  9.2  8.8   Total Protein 6.5 - 8.1 g/dL 7.7  7.5  7.3   Total Bilirubin 0.3 - 1.2 mg/dL 0.3  0.3  0.3   Alkaline Phos 38 - 126 U/L 66  77  72   AST 15 - 41 U/L '15  11  11   '$ ALT 0 - 44 U/L '8  5  6    '$ RADIOGRAPHIC STUDIES: No results found.  ASSESSMENT & PLAN Kegan Mckeithan 76 y.o. male with medical history significant for metastatic prostate cancer who presents for a follow up visit.  PMH significant for prostate adenocarcinoma Gleason 3+4, no LN involvement s.p robotic prostatectomy in 2013, presented with peri aortic left iliac and peri rectal LN, with biopsy confirming metastatic prostate adenoca now on zytiga and lupron presents here for FU on his metastatic prostate cancer. Baseline PSA of 207.   #  Metastatic Castrate Sensitive Prostate Cancer -- continue on Zytiga '1000mg'$  PO daily with prednisone --Next dose of Lupron due on 03/26/2022, he is overdue for this.  We will schedule this ASAP. -- last PSA in June less than 0.02, PSA from today is pending-  ---He should continue on Zytiga with prednisone and repeat labs today.  Zytiga prescription refill.  CBC and CMP reviewed and satisfactory.   --- He did not have any bone metastasis hence he is not on bisphosphonate therapy. Return to clinic with me in 8 weeks with repeat labs.  No orders of the defined types were placed in this encounter.  All questions were answered. The patient knows to call the clinic with any problems, questions or concerns.  A total of more than 20 minutes were spent on this encounter with face-to-face time and non-face-to-face time, including preparing to see the patient, ordering tests and/or medications, counseling the patient and coordination of care as outlined above.   Benay Pike MD   04/15/2022 5:20 PM

## 2022-04-16 ENCOUNTER — Telehealth: Payer: Self-pay | Admitting: Hematology and Oncology

## 2022-04-16 ENCOUNTER — Other Ambulatory Visit: Payer: Self-pay | Admitting: *Deleted

## 2022-04-16 DIAGNOSIS — Z79899 Other long term (current) drug therapy: Secondary | ICD-10-CM | POA: Diagnosis not present

## 2022-04-16 LAB — PSA, TOTAL AND FREE
PSA, Free Pct: UNDETERMINED %
PSA, Free: <0.02 ng/mL
Prostate Specific Ag, Serum: <0.1 ng/mL (ref 0.0–4.0)

## 2022-04-16 NOTE — Telephone Encounter (Signed)
Called patient to scheduled injection appointment, advised patient that it needed to be done today or tomorrow. Patient said he can only come on Monday in the afternoon. Appointments scheduled.

## 2022-04-20 ENCOUNTER — Inpatient Hospital Stay: Payer: Medicare HMO

## 2022-04-21 ENCOUNTER — Telehealth: Payer: Self-pay | Admitting: Hematology and Oncology

## 2022-04-21 NOTE — Telephone Encounter (Signed)
Contacted patient about missing his scheduled injection yesterday. Patient advised he did not want to come in until his next scheduled appointment in August.

## 2022-05-12 ENCOUNTER — Other Ambulatory Visit: Payer: Self-pay

## 2022-05-12 ENCOUNTER — Inpatient Hospital Stay: Payer: Medicare HMO | Attending: Hematology and Oncology

## 2022-05-12 ENCOUNTER — Encounter: Payer: Self-pay | Admitting: Hematology and Oncology

## 2022-05-12 ENCOUNTER — Inpatient Hospital Stay: Payer: Medicare HMO

## 2022-05-12 ENCOUNTER — Inpatient Hospital Stay: Payer: Medicare HMO | Admitting: Hematology and Oncology

## 2022-05-12 VITALS — BP 142/91 | HR 80 | Temp 97.8°F | Resp 16 | Ht 69.0 in | Wt 161.7 lb

## 2022-05-12 DIAGNOSIS — Z5111 Encounter for antineoplastic chemotherapy: Secondary | ICD-10-CM | POA: Diagnosis not present

## 2022-05-12 DIAGNOSIS — C61 Malignant neoplasm of prostate: Secondary | ICD-10-CM | POA: Diagnosis not present

## 2022-05-12 DIAGNOSIS — F17210 Nicotine dependence, cigarettes, uncomplicated: Secondary | ICD-10-CM | POA: Insufficient documentation

## 2022-05-12 DIAGNOSIS — C772 Secondary and unspecified malignant neoplasm of intra-abdominal lymph nodes: Secondary | ICD-10-CM

## 2022-05-12 DIAGNOSIS — I10 Essential (primary) hypertension: Secondary | ICD-10-CM | POA: Diagnosis not present

## 2022-05-12 DIAGNOSIS — R030 Elevated blood-pressure reading, without diagnosis of hypertension: Secondary | ICD-10-CM | POA: Diagnosis not present

## 2022-05-12 DIAGNOSIS — M545 Low back pain, unspecified: Secondary | ICD-10-CM | POA: Diagnosis not present

## 2022-05-12 DIAGNOSIS — Z79899 Other long term (current) drug therapy: Secondary | ICD-10-CM | POA: Diagnosis not present

## 2022-05-12 LAB — CBC WITH DIFFERENTIAL/PLATELET
Abs Immature Granulocytes: 0.03 10*3/uL (ref 0.00–0.07)
Basophils Absolute: 0.1 10*3/uL (ref 0.0–0.1)
Basophils Relative: 1 %
Eosinophils Absolute: 0.2 10*3/uL (ref 0.0–0.5)
Eosinophils Relative: 2 %
HCT: 29.1 % — ABNORMAL LOW (ref 39.0–52.0)
Hemoglobin: 9.8 g/dL — ABNORMAL LOW (ref 13.0–17.0)
Immature Granulocytes: 0 %
Lymphocytes Relative: 21 %
Lymphs Abs: 1.9 10*3/uL (ref 0.7–4.0)
MCH: 29.3 pg (ref 26.0–34.0)
MCHC: 33.7 g/dL (ref 30.0–36.0)
MCV: 87.1 fL (ref 80.0–100.0)
Monocytes Absolute: 0.6 10*3/uL (ref 0.1–1.0)
Monocytes Relative: 6 %
Neutro Abs: 6.1 10*3/uL (ref 1.7–7.7)
Neutrophils Relative %: 70 %
Platelets: 304 10*3/uL (ref 150–400)
RBC: 3.34 MIL/uL — ABNORMAL LOW (ref 4.22–5.81)
RDW: 13.6 % (ref 11.5–15.5)
WBC: 8.9 10*3/uL (ref 4.0–10.5)
nRBC: 0 % (ref 0.0–0.2)

## 2022-05-12 LAB — COMPREHENSIVE METABOLIC PANEL
ALT: 5 U/L (ref 0–44)
AST: 13 U/L — ABNORMAL LOW (ref 15–41)
Albumin: 4.2 g/dL (ref 3.5–5.0)
Alkaline Phosphatase: 73 U/L (ref 38–126)
Anion gap: 7 (ref 5–15)
BUN: 16 mg/dL (ref 8–23)
CO2: 30 mmol/L (ref 22–32)
Calcium: 9.3 mg/dL (ref 8.9–10.3)
Chloride: 104 mmol/L (ref 98–111)
Creatinine, Ser: 1.22 mg/dL (ref 0.61–1.24)
GFR, Estimated: >60 mL/min (ref >60)
Glucose, Bld: 127 mg/dL — ABNORMAL HIGH (ref 70–99)
Potassium: 3 mmol/L — ABNORMAL LOW (ref 3.5–5.1)
Sodium: 141 mmol/L (ref 135–145)
Total Bilirubin: 0.4 mg/dL (ref 0.3–1.2)
Total Protein: 7 g/dL (ref 6.5–8.1)

## 2022-05-12 MED ORDER — LEUPROLIDE ACETATE (3 MONTH) 22.5 MG ~~LOC~~ KIT
22.5000 mg | PACK | Freq: Once | SUBCUTANEOUS | Status: AC
  Administered 2022-05-12: 22.5 mg via SUBCUTANEOUS
  Filled 2022-05-12: qty 22.5

## 2022-05-12 NOTE — Progress Notes (Signed)
[place] [phone]   [phone]  PROGRESS NOTE  Patient Care Team: Iona Beard, MD as PCP - General (Family Medicine)  Hematological/Oncological History  # Metastatic Prostate Cancer w/ Spread to Intraabdominal Lymph Nodes --previously followed by Dr. Chryl Heck. Currently on abiraterone therapy 06/18/2021: PSA <0.01  Interval History:   Joseph Rangel 76 y.o. male with medical history significant for metastatic prostate cancer who presents for a follow up visit.  He denies any new complaints. He is taking zytiga and prednisone. HE complains of some urinary incontinence. Appetite is bad, not eating well. NO nausea, vomiting or diarrhea. He has not lost much weight.  He continues to smoke but working on cutting it down. Rest of the pertinent 10 point ROS reviewed and negative  MEDICAL HISTORY:  Past Medical History:  Diagnosis Date   Arthritis    Cancer ([place])    prostate    Frequency of urination    Hypertension    Seizures ([place])    after brain surg - none in past 20 yrs   Speech disorder    due to brain surg    SURGICAL HISTORY: Past Surgical History:  Procedure Laterality Date   BRAIN SURGERY     BLOOD CLOT REMOVED 1980   MASS EXCISION  02/24/2012   Procedure: MINOR EXCISION OF MASS;  Surgeon: Bernestine Amass, MD;  Location: [facility];  Service: Urology;;  excision of neck lesion   ROBOT ASSISTED LAPAROSCOPIC RADICAL PROSTATECTOMY  02/24/2012   Procedure: ROBOTIC ASSISTED LAPAROSCOPIC RADICAL PROSTATECTOMY;  Surgeon: Bernestine Amass, MD;  Location: [facility];  Service: Urology;  Laterality: N/A;           SOCIAL HISTORY: Social History   Socioeconomic History   Marital status: Married    Spouse name: Not on file   Number of children: Not on file   Years of education: Not on file   Highest education level: Not on file  Occupational History   Not on file  Tobacco Use   Smoking status: Every Day    Types: Cigarettes   Smokeless  tobacco: Never   Tobacco comments:    2 cigarettes per day  Vaping Use   Vaping Use: Never used  Substance and Sexual Activity   Alcohol use: No   Drug use: No   Sexual activity: Not on file  Other Topics Concern   Not on file  Social History Narrative   Not on file   Social Determinants of Health   Financial Resource Strain: Not on file  Food Insecurity: Not on file  Transportation Needs: Not on file  Physical Activity: Not on file  Stress: Not on file  Social Connections: Not on file  Intimate Partner Violence: Not on file    FAMILY HISTORY: No family history on file.  ALLERGIES:  has No Known Allergies.  MEDICATIONS:  Current Outpatient Medications  Medication Sig Dispense Refill   abiraterone acetate (ZYTIGA) 250 MG tablet TAKE 4 TABLETS (1,000 MG TOTAL) BY MOUTH DAILY. TAKE ON AN EMPTY STOMACH 1 HOUR BEFORE OR 2 HOURS AFTER A MEAL 120 tablet 2   atenolol (TENORMIN) 25 MG tablet Take 25 mg by mouth daily.     buprenorphine (BUTRANS) 7.5 MCG/HR  (Patient not taking: Reported on 08/20/2021)     cilostazol (PLETAL) 50 MG tablet Take 50 mg by mouth daily.     fenofibrate (TRICOR) 48 MG tablet Take 48 mg by mouth daily.     gabapentin (NEURONTIN)  100 MG capsule Take by mouth.     hydrochlorothiazide (HYDRODIURIL) 25 MG tablet Take 25 mg by mouth daily.     HYDROcodone-acetaminophen (NORCO) 10-325 MG tablet Take 1 tablet by mouth every 4 (four) hours as needed for moderate pain.     latanoprost (XALATAN) 0.005 % ophthalmic solution Place 1 drop into both eyes every other day. At night     meloxicam (MOBIC) 15 MG tablet Take 15 mg by mouth daily.     olmesartan (BENICAR) 40 MG tablet Take 40 mg by mouth daily.     omeprazole (PRILOSEC) 40 MG capsule Take 40 mg by mouth daily.     phenytoin (DILANTIN) 100 MG ER capsule Take 300 mg by mouth daily.     potassium chloride SA (KLOR-CON M) 20 MEQ tablet TAKE 1 TABLET BY MOUTH ONCE DAILY 30 tablet 1   predniSONE (DELTASONE) 5 MG  tablet TAKE 1 TABLET BY MOUTH ONCE A DAY WITH BREAKFAST 90 tablet 3   verapamil (VERELAN PM) 360 MG 24 hr capsule Take 360 mg by mouth daily.     No current facility-administered medications for this visit.    REVIEW OF SYSTEMS:   Constitutional: ( - ) fevers, ( - )  chills , ( - ) night sweats Eyes: ( - ) blurriness of vision, ( - ) double vision, ( - ) watery eyes Ears, nose, mouth, throat, and face: ( - ) mucositis, ( - ) sore throat Respiratory: ( - ) cough, ( - ) dyspnea, ( - ) wheezes Cardiovascular: ( - ) palpitation, ( - ) chest discomfort, ( - ) lower extremity swelling Gastrointestinal:  ( - ) nausea, ( - ) heartburn, ( - ) change in bowel habits Skin: ( - ) abnormal skin rashes Lymphatics: ( - ) new lymphadenopathy, ( - ) easy bruising Neurological: ( - ) numbness, ( - ) tingling, ( - ) new weaknesses Behavioral/Psych: ( - ) mood change, ( - ) new changes  All other systems were reviewed with the patient and are negative.  PHYSICAL EXAMINATION: ECOG PERFORMANCE STATUS: 2 - Symptomatic, <50% confined to bed  Vitals:   05/12/22 1535  BP: (!) 142/91  Pulse: 80  Resp: 16  Temp: 97.8 F (36.6 C)  SpO2: 98%     Filed Weights   05/12/22 1535  Weight: 161 lb 11.2 oz (73.3 kg)    Physical Exam Constitutional:      General: He is not in acute distress.    Appearance: Normal appearance.     Comments: Walks with a walker today  HENT:     Head: Normocephalic and atraumatic.     Comments: Bad breath today Cardiovascular:     Rate and Rhythm: Normal rate and regular rhythm.     Pulses: Normal pulses.     Heart sounds: Normal heart sounds.  Pulmonary:     Effort: Pulmonary effort is normal.     Breath sounds: Normal breath sounds. No wheezing.  Musculoskeletal:        General: No swelling or tenderness.  Neurological:     General: No focal deficit present.     Mental Status: He is alert.  Psychiatric:        Mood and Affect: Mood normal.      LABORATORY  DATA:  I have reviewed the data as listed    Latest Ref Rng & Units 05/12/2022    3:19 PM 04/15/2022    3:09 PM 03/18/2022  3:39 PM  CBC  WBC 4.0 - 10.5 K/uL 8.9  7.4  11.6   Hemoglobin 13.0 - 17.0 g/dL 9.8  9.0  9.7   Hematocrit 39.0 - 52.0 % 29.1  27.0  28.9   Platelets 150 - 400 K/uL 304  264  276        Latest Ref Rng & Units 04/15/2022    3:09 PM 02/17/2022    3:14 PM 12/16/2021    3:35 PM  CMP  Glucose 70 - 99 mg/dL 114  126  154   BUN 8 - 23 mg/dL '18  13  14   '$ Creatinine 0.61 - 1.24 mg/dL 1.15  1.04  1.04   Sodium 135 - 145 mmol/L 140  137  140   Potassium 3.5 - 5.1 mmol/L 3.5  3.8  3.8   Chloride 98 - 111 mmol/L 108  106  108   CO2 22 - 32 mmol/L '23  24  24   '$ Calcium 8.9 - 10.3 mg/dL 8.9  9.2  8.8   Total Protein 6.5 - 8.1 g/dL 7.7  7.5  7.3   Total Bilirubin 0.3 - 1.2 mg/dL 0.3  0.3  0.3   Alkaline Phos 38 - 126 U/L 66  77  72   AST 15 - 41 U/L '15  11  11   '$ ALT 0 - 44 U/L '8  5  6    '$ RADIOGRAPHIC STUDIES: No results found.  ASSESSMENT & PLAN Dorr Perrot 76 y.o. male with medical history significant for metastatic prostate cancer who presents for a follow up visit.  PMH significant for prostate adenocarcinoma Gleason 3+4, no LN involvement s.p robotic prostatectomy in 2013, presented with peri aortic left iliac and peri rectal LN, with biopsy confirming metastatic prostate adenoca now on zytiga and lupron presents here for FU on his metastatic prostate cancer. Baseline PSA of 207.   # Metastatic Castrate Sensitive Prostate Cancer -- continue on Zytiga '1000mg'$  PO daily with prednisone -- He will receive lupron today, due every 3 months. -- last PSA in June less than 0.02, PSA from today is pending-  ---He should continue on Zytiga with prednisone and RTC in 8 weeks -- He did not have any bone metastasis hence he is not on bisphosphonate therapy. Return to clinic with me in 8 weeks with repeat labs.  Orders Placed This Encounter  Procedures   Comprehensive  metabolic panel    Standing Status:   Standing    Number of Occurrences:   33    Standing Expiration Date:   05/13/2023   All questions were answered. The patient knows to call the clinic with any problems, questions or concerns.  A total of more than 20 minutes were spent on this encounter with face-to-face time and non-face-to-face time, including preparing to see the patient, ordering tests and/or medications, counseling the patient and coordination of care as outlined above.   Benay Pike MD   05/12/2022 3:36 PM

## 2022-05-13 LAB — PSA, TOTAL AND FREE
PSA, Free Pct: UNDETERMINED %
PSA, Free: <0.02 ng/mL
Prostate Specific Ag, Serum: <0.1 ng/mL (ref 0.0–4.0)

## 2022-05-15 DIAGNOSIS — Z79899 Other long term (current) drug therapy: Secondary | ICD-10-CM | POA: Diagnosis not present

## 2022-06-09 DIAGNOSIS — E785 Hyperlipidemia, unspecified: Secondary | ICD-10-CM | POA: Diagnosis not present

## 2022-06-09 DIAGNOSIS — R569 Unspecified convulsions: Secondary | ICD-10-CM | POA: Diagnosis not present

## 2022-06-09 DIAGNOSIS — E781 Pure hyperglyceridemia: Secondary | ICD-10-CM | POA: Diagnosis not present

## 2022-06-09 DIAGNOSIS — C61 Malignant neoplasm of prostate: Secondary | ICD-10-CM | POA: Diagnosis not present

## 2022-06-09 DIAGNOSIS — Z6823 Body mass index (BMI) 23.0-23.9, adult: Secondary | ICD-10-CM | POA: Diagnosis not present

## 2022-06-09 DIAGNOSIS — I10 Essential (primary) hypertension: Secondary | ICD-10-CM | POA: Diagnosis not present

## 2022-06-09 DIAGNOSIS — Z720 Tobacco use: Secondary | ICD-10-CM | POA: Diagnosis not present

## 2022-06-09 DIAGNOSIS — M150 Primary generalized (osteo)arthritis: Secondary | ICD-10-CM | POA: Diagnosis not present

## 2022-06-10 ENCOUNTER — Inpatient Hospital Stay: Payer: Medicare HMO | Admitting: Hematology and Oncology

## 2022-06-10 ENCOUNTER — Inpatient Hospital Stay: Payer: Medicare HMO | Attending: Hematology and Oncology

## 2022-06-16 DIAGNOSIS — R030 Elevated blood-pressure reading, without diagnosis of hypertension: Secondary | ICD-10-CM | POA: Diagnosis not present

## 2022-06-16 DIAGNOSIS — Z79899 Other long term (current) drug therapy: Secondary | ICD-10-CM | POA: Diagnosis not present

## 2022-06-16 DIAGNOSIS — M545 Low back pain, unspecified: Secondary | ICD-10-CM | POA: Diagnosis not present

## 2022-06-18 DIAGNOSIS — Z79899 Other long term (current) drug therapy: Secondary | ICD-10-CM | POA: Diagnosis not present

## 2022-06-20 DIAGNOSIS — E7849 Other hyperlipidemia: Secondary | ICD-10-CM | POA: Diagnosis not present

## 2022-06-20 DIAGNOSIS — I10 Essential (primary) hypertension: Secondary | ICD-10-CM | POA: Diagnosis not present

## 2022-07-08 ENCOUNTER — Inpatient Hospital Stay: Payer: Medicare HMO | Attending: Hematology and Oncology

## 2022-07-08 ENCOUNTER — Other Ambulatory Visit: Payer: Self-pay | Admitting: *Deleted

## 2022-07-08 ENCOUNTER — Inpatient Hospital Stay (HOSPITAL_BASED_OUTPATIENT_CLINIC_OR_DEPARTMENT_OTHER): Payer: Medicare HMO | Admitting: Hematology and Oncology

## 2022-07-08 ENCOUNTER — Telehealth: Payer: Self-pay | Admitting: *Deleted

## 2022-07-08 ENCOUNTER — Inpatient Hospital Stay: Payer: Medicare HMO | Admitting: Hematology and Oncology

## 2022-07-08 ENCOUNTER — Inpatient Hospital Stay: Payer: Medicare HMO

## 2022-07-08 ENCOUNTER — Other Ambulatory Visit: Payer: Self-pay

## 2022-07-08 VITALS — BP 168/89 | HR 83 | Temp 97.9°F | Resp 16 | Ht 69.0 in | Wt 159.6 lb

## 2022-07-08 DIAGNOSIS — F17210 Nicotine dependence, cigarettes, uncomplicated: Secondary | ICD-10-CM | POA: Insufficient documentation

## 2022-07-08 DIAGNOSIS — C772 Secondary and unspecified malignant neoplasm of intra-abdominal lymph nodes: Secondary | ICD-10-CM | POA: Diagnosis not present

## 2022-07-08 DIAGNOSIS — I10 Essential (primary) hypertension: Secondary | ICD-10-CM | POA: Diagnosis not present

## 2022-07-08 DIAGNOSIS — C61 Malignant neoplasm of prostate: Secondary | ICD-10-CM

## 2022-07-08 DIAGNOSIS — R590 Localized enlarged lymph nodes: Secondary | ICD-10-CM | POA: Diagnosis not present

## 2022-07-08 DIAGNOSIS — Z7952 Long term (current) use of systemic steroids: Secondary | ICD-10-CM | POA: Insufficient documentation

## 2022-07-08 LAB — CBC WITH DIFFERENTIAL/PLATELET
Abs Immature Granulocytes: 0.04 10*3/uL (ref 0.00–0.07)
Basophils Absolute: 0 10*3/uL (ref 0.0–0.1)
Basophils Relative: 0 %
Eosinophils Absolute: 0.2 10*3/uL (ref 0.0–0.5)
Eosinophils Relative: 2 %
HCT: 28 % — ABNORMAL LOW (ref 39.0–52.0)
Hemoglobin: 9.4 g/dL — ABNORMAL LOW (ref 13.0–17.0)
Immature Granulocytes: 0 %
Lymphocytes Relative: 20 %
Lymphs Abs: 2 10*3/uL (ref 0.7–4.0)
MCH: 29.3 pg (ref 26.0–34.0)
MCHC: 33.6 g/dL (ref 30.0–36.0)
MCV: 87.2 fL (ref 80.0–100.0)
Monocytes Absolute: 0.6 10*3/uL (ref 0.1–1.0)
Monocytes Relative: 6 %
Neutro Abs: 7 10*3/uL (ref 1.7–7.7)
Neutrophils Relative %: 72 %
Platelets: 356 10*3/uL (ref 150–400)
RBC: 3.21 MIL/uL — ABNORMAL LOW (ref 4.22–5.81)
RDW: 13.1 % (ref 11.5–15.5)
WBC: 9.8 10*3/uL (ref 4.0–10.5)
nRBC: 0 % (ref 0.0–0.2)

## 2022-07-08 LAB — COMPREHENSIVE METABOLIC PANEL
ALT: 6 U/L (ref 0–44)
AST: 14 U/L — ABNORMAL LOW (ref 15–41)
Albumin: 3.7 g/dL (ref 3.5–5.0)
Alkaline Phosphatase: 77 U/L (ref 38–126)
Anion gap: 9 (ref 5–15)
BUN: 17 mg/dL (ref 8–23)
CO2: 28 mmol/L (ref 22–32)
Calcium: 8.9 mg/dL (ref 8.9–10.3)
Chloride: 103 mmol/L (ref 98–111)
Creatinine, Ser: 1.01 mg/dL (ref 0.61–1.24)
GFR, Estimated: >60 mL/min (ref >60)
Glucose, Bld: 121 mg/dL — ABNORMAL HIGH (ref 70–99)
Potassium: 2.4 mmol/L — CL (ref 3.5–5.1)
Sodium: 140 mmol/L (ref 135–145)
Total Bilirubin: 0.4 mg/dL (ref 0.3–1.2)
Total Protein: 7.9 g/dL (ref 6.5–8.1)

## 2022-07-08 MED ORDER — ABIRATERONE ACETATE 250 MG PO TABS: 1000.0000 mg | ORAL_TABLET | Freq: Every day | ORAL | 2 refills | Status: DC

## 2022-07-08 NOTE — Telephone Encounter (Signed)
This RN was informed of potassium of 2.4 per lab draw today - informed MD who requested for pt to increase his potassium ( 20 mEq daily on med list) - to bid.  This RN called and spoke with the pt's wife- Joseph Rangel ( [name] was not at home) - she states " he takes his potassium every morning "  This RN asked that the patient needs to increase his potassium to 1 tab twice a day and we will need to recheck his lab next week.  This RN reiterated need for pt to be compliant due to concern for abnormal heart rhythms which could be life threatening.  If pt is unable to increase his potassium tablets they need to call this RN.  [name] verbalized understanding of above- she also stated he has enough medication on hand to allow for increased dose.  Appointment request sent to scheduling for lab on 10/23.

## 2022-07-08 NOTE — Progress Notes (Unsigned)
Notified to Hinda Lenis at [phone] on 18Oct23 by [facility].

## 2022-07-08 NOTE — Progress Notes (Signed)
[place] [phone]   Fax:(336) [phone]  PROGRESS NOTE  Patient Care Team: Iona Beard, MD as PCP - General (Family Medicine)  Hematological/Oncological History  # Metastatic Prostate Cancer w/ Spread to Intraabdominal Lymph Nodes --previously followed by Dr. Chryl Heck. Currently on abiraterone therapy 06/18/2021: PSA <0.01  Interval History:   Joseph Rangel 76 y.o. male with medical history significant for metastatic prostate cancer who presents for a follow up visit.  He denies any new complaints. He is taking zytiga and prednisone.  He is here today with his son.  Since his last visit, he continues to do the same.  He has urinary incontinence at baseline, no change.  No new bone pains.  Appetite is poor but this once again has remained the same.  He is compliant with his Zytiga and prednisone.  He denies any side effects with these medications.  Rest of the pertinent 10 point ROS reviewed and negative  MEDICAL HISTORY:  Past Medical History:  Diagnosis Date   Arthritis    Cancer ([place])    prostate    Frequency of urination    Hypertension    Seizures ([place])    after brain surg - none in past 20 yrs   Speech disorder    due to brain surg    SURGICAL HISTORY: Past Surgical History:  Procedure Laterality Date   BRAIN SURGERY     BLOOD CLOT REMOVED 1980   MASS EXCISION  02/24/2012   Procedure: MINOR EXCISION OF MASS;  Surgeon: Bernestine Amass, MD;  Location: [facility];  Service: Urology;;  excision of neck lesion   ROBOT ASSISTED LAPAROSCOPIC RADICAL PROSTATECTOMY  02/24/2012   Procedure: ROBOTIC ASSISTED LAPAROSCOPIC RADICAL PROSTATECTOMY;  Surgeon: Bernestine Amass, MD;  Location: [facility];  Service: Urology;  Laterality: N/A;           SOCIAL HISTORY: Social History   Socioeconomic History   Marital status: Married    Spouse name: Not on file   Number of children: Not on file   Years of education: Not on file   Highest education level: Not on  file  Occupational History   Not on file  Tobacco Use   Smoking status: Every Day    Types: Cigarettes   Smokeless tobacco: Never   Tobacco comments:    2 cigarettes per day  Vaping Use   Vaping Use: Never used  Substance and Sexual Activity   Alcohol use: No   Drug use: No   Sexual activity: Not on file  Other Topics Concern   Not on file  Social History Narrative   Not on file   Social Determinants of Health   Financial Resource Strain: Not on file  Food Insecurity: Not on file  Transportation Needs: Not on file  Physical Activity: Not on file  Stress: Not on file  Social Connections: Not on file  Intimate Partner Violence: Not on file    FAMILY HISTORY: No family history on file.  ALLERGIES:  has No Known Allergies.  MEDICATIONS:  Current Outpatient Medications  Medication Sig Dispense Refill   abiraterone acetate (ZYTIGA) 250 MG tablet TAKE 4 TABLETS (1,000 MG TOTAL) BY MOUTH DAILY. TAKE ON AN EMPTY STOMACH 1 HOUR BEFORE OR 2 HOURS AFTER A MEAL 120 tablet 2   atenolol (TENORMIN) 25 MG tablet Take 25 mg by mouth daily.     buprenorphine (BUTRANS) 7.5 MCG/HR  (Patient not taking: Reported on 08/20/2021)     cilostazol (PLETAL)  50 MG tablet Take 50 mg by mouth daily.     fenofibrate (TRICOR) 48 MG tablet Take 48 mg by mouth daily.     gabapentin (NEURONTIN) 100 MG capsule Take by mouth.     hydrochlorothiazide (HYDRODIURIL) 25 MG tablet Take 25 mg by mouth daily.     HYDROcodone-acetaminophen (NORCO) 10-325 MG tablet Take 1 tablet by mouth every 4 (four) hours as needed for moderate pain.     latanoprost (XALATAN) 0.005 % ophthalmic solution Place 1 drop into both eyes every other day. At night     meloxicam (MOBIC) 15 MG tablet Take 15 mg by mouth daily.     olmesartan (BENICAR) 40 MG tablet Take 40 mg by mouth daily.     omeprazole (PRILOSEC) 40 MG capsule Take 40 mg by mouth daily.     phenytoin (DILANTIN) 100 MG ER capsule Take 300 mg by mouth daily.      potassium chloride SA (KLOR-CON M) 20 MEQ tablet TAKE 1 TABLET BY MOUTH ONCE DAILY 30 tablet 1   predniSONE (DELTASONE) 5 MG tablet TAKE 1 TABLET BY MOUTH ONCE A DAY WITH BREAKFAST 90 tablet 3   verapamil (VERELAN PM) 360 MG 24 hr capsule Take 360 mg by mouth daily.     No current facility-administered medications for this visit.    REVIEW OF SYSTEMS:   Constitutional: ( - ) fevers, ( - )  chills , ( - ) night sweats Eyes: ( - ) blurriness of vision, ( - ) double vision, ( - ) watery eyes Ears, nose, mouth, throat, and face: ( - ) mucositis, ( - ) sore throat Respiratory: ( - ) cough, ( - ) dyspnea, ( - ) wheezes Cardiovascular: ( - ) palpitation, ( - ) chest discomfort, ( - ) lower extremity swelling Gastrointestinal:  ( - ) nausea, ( - ) heartburn, ( - ) change in bowel habits Skin: ( - ) abnormal skin rashes Lymphatics: ( - ) new lymphadenopathy, ( - ) easy bruising Neurological: ( - ) numbness, ( - ) tingling, ( - ) new weaknesses Behavioral/Psych: ( - ) mood change, ( - ) new changes  All other systems were reviewed with the patient and are negative.  PHYSICAL EXAMINATION: ECOG PERFORMANCE STATUS: 2 - Symptomatic, <50% confined to bed  Vitals:   07/08/22 1529  BP: (!) 168/89  Pulse: 83  Resp: 16  Temp: 97.9 F (36.6 C)  SpO2: 100%     Filed Weights   07/08/22 1529  Weight: 159 lb 9.6 oz (72.4 kg)    Physical Exam Constitutional:      General: He is not in acute distress.    Appearance: Normal appearance.     Comments: Walks with a walker today  HENT:     Head: Normocephalic and atraumatic.  Cardiovascular:     Rate and Rhythm: Normal rate and regular rhythm.     Pulses: Normal pulses.     Heart sounds: Normal heart sounds.  Pulmonary:     Effort: Pulmonary effort is normal.     Breath sounds: Normal breath sounds. No wheezing.  Musculoskeletal:        General: No swelling or tenderness.  Neurological:     General: No focal deficit present.     Mental  Status: He is alert.  Psychiatric:        Mood and Affect: Mood normal.      LABORATORY DATA:  I have reviewed the data as listed  Latest Ref Rng & Units 07/08/2022    3:09 PM 05/12/2022    3:19 PM 04/15/2022    3:09 PM  CBC  WBC 4.0 - 10.5 K/uL 9.8  8.9  7.4   Hemoglobin 13.0 - 17.0 g/dL 9.4  9.8  9.0   Hematocrit 39.0 - 52.0 % 28.0  29.1  27.0   Platelets 150 - 400 K/uL 356  304  264        Latest Ref Rng & Units 07/08/2022    3:09 PM 05/12/2022    3:19 PM 04/15/2022    3:09 PM  CMP  Glucose 70 - 99 mg/dL 121  127  114   BUN 8 - 23 mg/dL '17  16  18   '$ Creatinine 0.61 - 1.24 mg/dL 1.01  1.22  1.15   Sodium 135 - 145 mmol/L 140  141  140   Potassium 3.5 - 5.1 mmol/L 2.4  3.0  3.5   Chloride 98 - 111 mmol/L 103  104  108   CO2 22 - 32 mmol/L '28  30  23   '$ Calcium 8.9 - 10.3 mg/dL 8.9  9.3  8.9   Total Protein 6.5 - 8.1 g/dL 7.9  7.0  7.7   Total Bilirubin 0.3 - 1.2 mg/dL 0.4  0.4  0.3   Alkaline Phos 38 - 126 U/L 77  73  66   AST 15 - 41 U/L '14  13  15   '$ ALT 0 - 44 U/L '6  5  8    '$ RADIOGRAPHIC STUDIES: No results found.  ASSESSMENT & PLAN Joseph Rangel 76 y.o. male with medical history significant for metastatic prostate cancer who presents for a follow up visit.  PMH significant for prostate adenocarcinoma Gleason 3+4, no LN involvement s.p robotic prostatectomy in 2013, presented with peri aortic left iliac and peri rectal LN, with biopsy confirming metastatic prostate adenoca now on zytiga and lupron presents here for FU on his metastatic prostate cancer. Baseline PSA of 207.   # Metastatic Castrate Sensitive Prostate Cancer -- continue on Zytiga '1000mg'$  PO daily with prednisone -- He will receive lupron today, due every 3 months.  Next injection due 08/02/2022 -- PSA from today is pending, last PSA without any concerns for recurrence. ---He should continue on Zytiga with prednisone and RTC in 8 weeks -- He did not have any bone metastasis hence he is not on  bisphosphonate therapy. Return to clinic with me in 8 weeks with repeat labs.  No orders of the defined types were placed in this encounter.  All questions were answered. The patient knows to call the clinic with any problems, questions or concerns.  A total of more than 20 minutes were spent on this encounter with face-to-face time and non-face-to-face time, including preparing to see the patient, ordering tests and/or medications, counseling the patient and coordination of care as outlined above.   Benay Pike MD   07/08/2022 5:16 PM

## 2022-07-09 ENCOUNTER — Telehealth: Payer: Self-pay | Admitting: Hematology and Oncology

## 2022-07-09 NOTE — Telephone Encounter (Signed)
Scheduled appointment per 10/19 staff message. Patient is aware.

## 2022-07-10 LAB — PSA, TOTAL AND FREE
PSA, Free Pct: UNDETERMINED %
PSA, Free: <0.02 ng/mL
Prostate Specific Ag, Serum: <0.1 ng/mL (ref 0.0–4.0)

## 2022-07-13 ENCOUNTER — Telehealth: Payer: Self-pay

## 2022-07-13 ENCOUNTER — Inpatient Hospital Stay: Payer: Medicare HMO

## 2022-07-13 ENCOUNTER — Other Ambulatory Visit: Payer: Self-pay

## 2022-07-13 DIAGNOSIS — I10 Essential (primary) hypertension: Secondary | ICD-10-CM | POA: Diagnosis not present

## 2022-07-13 DIAGNOSIS — Z7952 Long term (current) use of systemic steroids: Secondary | ICD-10-CM | POA: Diagnosis not present

## 2022-07-13 DIAGNOSIS — C61 Malignant neoplasm of prostate: Secondary | ICD-10-CM

## 2022-07-13 DIAGNOSIS — F17210 Nicotine dependence, cigarettes, uncomplicated: Secondary | ICD-10-CM | POA: Diagnosis not present

## 2022-07-13 DIAGNOSIS — C772 Secondary and unspecified malignant neoplasm of intra-abdominal lymph nodes: Secondary | ICD-10-CM | POA: Diagnosis not present

## 2022-07-13 LAB — COMPREHENSIVE METABOLIC PANEL
ALT: <5 U/L (ref 0–44)
AST: 11 U/L — ABNORMAL LOW (ref 15–41)
Albumin: 4 g/dL (ref 3.5–5.0)
Alkaline Phosphatase: 76 U/L (ref 38–126)
Anion gap: 8 (ref 5–15)
BUN: 15 mg/dL (ref 8–23)
CO2: 30 mmol/L (ref 22–32)
Calcium: 8.8 mg/dL — ABNORMAL LOW (ref 8.9–10.3)
Chloride: 100 mmol/L (ref 98–111)
Creatinine, Ser: 1.09 mg/dL (ref 0.61–1.24)
GFR, Estimated: >60 mL/min (ref >60)
Glucose, Bld: 127 mg/dL — ABNORMAL HIGH (ref 70–99)
Potassium: 2.7 mmol/L — CL (ref 3.5–5.1)
Sodium: 138 mmol/L (ref 135–145)
Total Bilirubin: 0.4 mg/dL (ref 0.3–1.2)
Total Protein: 7.4 g/dL (ref 6.5–8.1)

## 2022-07-13 NOTE — Telephone Encounter (Signed)
Attempted to call pt X2 to discuss critial potassium of 2.7. Per Dr Lindi Adie, pt should increase PO K+ to 20 meq BID if he is in compliance with taking his medication. If he is not in compliance, MD states he needs to take recommended dose of 20 meq daily. When call was attempted busy signal both times.

## 2022-07-14 DIAGNOSIS — J209 Acute bronchitis, unspecified: Secondary | ICD-10-CM | POA: Diagnosis not present

## 2022-07-14 DIAGNOSIS — Z79899 Other long term (current) drug therapy: Secondary | ICD-10-CM | POA: Diagnosis not present

## 2022-07-14 DIAGNOSIS — K5909 Other constipation: Secondary | ICD-10-CM | POA: Diagnosis not present

## 2022-07-14 DIAGNOSIS — M545 Low back pain, unspecified: Secondary | ICD-10-CM | POA: Diagnosis not present

## 2022-07-15 ENCOUNTER — Other Ambulatory Visit: Payer: Self-pay | Admitting: Hematology and Oncology

## 2022-07-15 DIAGNOSIS — C61 Malignant neoplasm of prostate: Secondary | ICD-10-CM

## 2022-07-15 DIAGNOSIS — E876 Hypokalemia: Secondary | ICD-10-CM

## 2022-07-16 ENCOUNTER — Other Ambulatory Visit: Payer: Self-pay | Admitting: *Deleted

## 2022-07-16 MED ORDER — POTASSIUM CHLORIDE CRYS ER 20 MEQ PO TBCR: 40.0000 meq | EXTENDED_RELEASE_TABLET | Freq: Every day | ORAL | 1 refills | Status: DC

## 2022-07-16 NOTE — Telephone Encounter (Signed)
This RN called pt and spoke with his wife with patient present- informed her of mild improvement of the potassium but need to continue the medication twice a day and we will recheck it next week.  This RN inquired if they need a refill- Mrs Omran had pt go get the bottle - which post a few minutes she states " he doesn't have a bottle for the potassium chloride or Klor Con ".  This RN verified pt's pharmacy and called to them- per technician - she states pt's last fill for potassium was on 06/19/2022 for 5 tablets of 10 meq by Dr Berdine Addison.  This RN sent in a new prescription for potassium 20 mEq bid and sent request for lab in 1 week to scheduling.

## 2022-07-22 ENCOUNTER — Telehealth: Payer: Self-pay | Admitting: *Deleted

## 2022-07-22 ENCOUNTER — Encounter: Payer: Self-pay | Admitting: Hematology and Oncology

## 2022-07-22 NOTE — Telephone Encounter (Signed)
Late entry:  On 07/16/2022 this RN called pt's home per mild improvement of potassium to inquire with compliance.  Per discussion with pt's wife - with pt present - this RN asked for them to get the bottle to verify dose as well if refill is needed.  Post checking - pt does not have a bottle of potassium. This RN asked if they consistently use the same pharmacy with verification as [facility] listed in chart.  This RN called the pharmacy and verified last fill of potassium with informed that last fill was in September 2023 for  tablets.  This RN obtained order and sent it to the pharmacy as well informed pt and his wife.  Appt requested for follow up lab.

## 2022-08-05 ENCOUNTER — Inpatient Hospital Stay: Payer: Medicare HMO | Attending: Hematology and Oncology

## 2022-08-05 ENCOUNTER — Other Ambulatory Visit: Payer: Self-pay | Admitting: *Deleted

## 2022-08-05 ENCOUNTER — Inpatient Hospital Stay: Payer: Medicare HMO | Admitting: Hematology and Oncology

## 2022-08-05 ENCOUNTER — Other Ambulatory Visit: Payer: Self-pay

## 2022-08-05 ENCOUNTER — Inpatient Hospital Stay: Payer: Medicare HMO

## 2022-08-05 VITALS — BP 133/76 | HR 76 | Temp 98.5°F | Resp 17

## 2022-08-05 VITALS — BP 144/76 | HR 80 | Temp 97.7°F | Resp 16 | Ht 69.0 in | Wt 160.4 lb

## 2022-08-05 DIAGNOSIS — Z791 Long term (current) use of non-steroidal anti-inflammatories (NSAID): Secondary | ICD-10-CM | POA: Diagnosis not present

## 2022-08-05 DIAGNOSIS — M79651 Pain in right thigh: Secondary | ICD-10-CM | POA: Insufficient documentation

## 2022-08-05 DIAGNOSIS — I10 Essential (primary) hypertension: Secondary | ICD-10-CM | POA: Insufficient documentation

## 2022-08-05 DIAGNOSIS — Z7952 Long term (current) use of systemic steroids: Secondary | ICD-10-CM | POA: Diagnosis not present

## 2022-08-05 DIAGNOSIS — Z79899 Other long term (current) drug therapy: Secondary | ICD-10-CM | POA: Diagnosis not present

## 2022-08-05 DIAGNOSIS — Z7902 Long term (current) use of antithrombotics/antiplatelets: Secondary | ICD-10-CM | POA: Insufficient documentation

## 2022-08-05 DIAGNOSIS — M25569 Pain in unspecified knee: Secondary | ICD-10-CM | POA: Insufficient documentation

## 2022-08-05 DIAGNOSIS — E876 Hypokalemia: Secondary | ICD-10-CM | POA: Insufficient documentation

## 2022-08-05 DIAGNOSIS — C772 Secondary and unspecified malignant neoplasm of intra-abdominal lymph nodes: Secondary | ICD-10-CM

## 2022-08-05 DIAGNOSIS — C61 Malignant neoplasm of prostate: Secondary | ICD-10-CM

## 2022-08-05 DIAGNOSIS — F17210 Nicotine dependence, cigarettes, uncomplicated: Secondary | ICD-10-CM | POA: Insufficient documentation

## 2022-08-05 LAB — CBC WITH DIFFERENTIAL (CANCER CENTER ONLY)
Abs Immature Granulocytes: 0.06 10*3/uL (ref 0.00–0.07)
Basophils Absolute: 0.1 10*3/uL (ref 0.0–0.1)
Basophils Relative: 1 %
Eosinophils Absolute: 0.2 10*3/uL (ref 0.0–0.5)
Eosinophils Relative: 2 %
HCT: 25.4 % — ABNORMAL LOW (ref 39.0–52.0)
Hemoglobin: 8.6 g/dL — ABNORMAL LOW (ref 13.0–17.0)
Immature Granulocytes: 1 %
Lymphocytes Relative: 18 %
Lymphs Abs: 1.6 10*3/uL (ref 0.7–4.0)
MCH: 29.9 pg (ref 26.0–34.0)
MCHC: 33.9 g/dL (ref 30.0–36.0)
MCV: 88.2 fL (ref 80.0–100.0)
Monocytes Absolute: 0.5 10*3/uL (ref 0.1–1.0)
Monocytes Relative: 6 %
Neutro Abs: 6.1 10*3/uL (ref 1.7–7.7)
Neutrophils Relative %: 72 %
Platelet Count: 337 10*3/uL (ref 150–400)
RBC: 2.88 MIL/uL — ABNORMAL LOW (ref 4.22–5.81)
RDW: 13.6 % (ref 11.5–15.5)
WBC Count: 8.5 10*3/uL (ref 4.0–10.5)
nRBC: 0 % (ref 0.0–0.2)

## 2022-08-05 LAB — CMP (CANCER CENTER ONLY)
ALT: 8 U/L (ref 0–44)
AST: 15 U/L (ref 15–41)
Albumin: 3.8 g/dL (ref 3.5–5.0)
Alkaline Phosphatase: 122 U/L (ref 38–126)
Anion gap: 8 (ref 5–15)
BUN: 17 mg/dL (ref 8–23)
CO2: 28 mmol/L (ref 22–32)
Calcium: 8.5 mg/dL — ABNORMAL LOW (ref 8.9–10.3)
Chloride: 100 mmol/L (ref 98–111)
Creatinine: 1.21 mg/dL (ref 0.61–1.24)
GFR, Estimated: >60 mL/min (ref >60)
Glucose, Bld: 131 mg/dL — ABNORMAL HIGH (ref 70–99)
Potassium: 2.7 mmol/L — CL (ref 3.5–5.1)
Sodium: 136 mmol/L (ref 135–145)
Total Bilirubin: 0.4 mg/dL (ref 0.3–1.2)
Total Protein: 7.3 g/dL (ref 6.5–8.1)

## 2022-08-05 MED ORDER — LEUPROLIDE ACETATE (3 MONTH) 22.5 MG ~~LOC~~ KIT
22.5000 mg | PACK | Freq: Once | SUBCUTANEOUS | Status: AC
  Administered 2022-08-05: 22.5 mg via SUBCUTANEOUS
  Filled 2022-08-05: qty 22.5

## 2022-08-05 NOTE — Progress Notes (Signed)
[place] [phone]   [phone]  PROGRESS NOTE  Patient Care Team: Iona Beard, MD as PCP - General (Family Medicine)  Hematological/Oncological History  # Metastatic Prostate Cancer w/ Spread to Intraabdominal Lymph Nodes --previously followed by Dr. Chryl Heck. Currently on abiraterone therapy 06/18/2021: PSA <0.01  Interval History:   Joseph Rangel 76 y.o. male with medical history significant for metastatic prostate cancer who presents for a follow up visit.  He had a fall and hit his back, he noticed pain in the right thigh and knee since then, Other than that he is doing well. He is compliant with his Zytiga and prednisone.  He can't tell if he is taking his potassium. He once again tells me that he is not sure if he refilled potassium. Rest of the pertinent 10 point ROS reviewed and negative.  MEDICAL HISTORY:  Past Medical History:  Diagnosis Date   Arthritis    Cancer [facility])    prostate    Frequency of urination    Hypertension    Seizures ([facility])    after brain surg - none in past 20 yrs   Speech disorder    due to brain surg    SURGICAL HISTORY: Past Surgical History:  Procedure Laterality Date   BRAIN SURGERY     BLOOD CLOT REMOVED 1980   MASS EXCISION  02/24/2012   Procedure: MINOR EXCISION OF MASS;  Surgeon: Bernestine Amass, MD;  Location: [facility];  Service: Urology;;  excision of neck lesion   ROBOT ASSISTED LAPAROSCOPIC RADICAL PROSTATECTOMY  02/24/2012   Procedure: ROBOTIC ASSISTED LAPAROSCOPIC RADICAL PROSTATECTOMY;  Surgeon: Bernestine Amass, MD;  Location: [facility];  Service: Urology;  Laterality: N/A;           SOCIAL HISTORY: Social History   Socioeconomic History   Marital status: Married    Spouse name: Not on file   Number of children: Not on file   Years of education: Not on file   Highest education level: Not on file  Occupational History   Not on file  Tobacco Use   Smoking status: Every Day     Types: Cigarettes   Smokeless tobacco: Never   Tobacco comments:    2 cigarettes per day  Vaping Use   Vaping Use: Never used  Substance and Sexual Activity   Alcohol use: No   Drug use: No   Sexual activity: Not on file  Other Topics Concern   Not on file  Social History Narrative   Not on file   Social Determinants of Health   Financial Resource Strain: Not on file  Food Insecurity: Not on file  Transportation Needs: Not on file  Physical Activity: Not on file  Stress: Not on file  Social Connections: Not on file  Intimate Partner Violence: Not on file    FAMILY HISTORY: No family history on file.  ALLERGIES:  has No Known Allergies.  MEDICATIONS:  Current Outpatient Medications  Medication Sig Dispense Refill   abiraterone acetate (ZYTIGA) 250 MG tablet TAKE 4 TABLETS (1,000 MG TOTAL) BY MOUTH DAILY. TAKE ON AN EMPTY STOMACH 1 HOUR BEFORE OR 2 HOURS AFTER A MEAL 120 tablet 2   atenolol (TENORMIN) 25 MG tablet Take 25 mg by mouth daily.     buprenorphine (BUTRANS) 7.5 MCG/HR  (Patient not taking: Reported on 08/20/2021)     cilostazol (PLETAL) 50 MG tablet Take 50 mg by mouth daily.     fenofibrate (TRICOR) 48 MG  tablet Take 48 mg by mouth daily.     gabapentin (NEURONTIN) 100 MG capsule Take by mouth.     hydrochlorothiazide (HYDRODIURIL) 25 MG tablet Take 25 mg by mouth daily.     HYDROcodone-acetaminophen (NORCO) 10-325 MG tablet Take 1 tablet by mouth every 4 (four) hours as needed for moderate pain.     latanoprost (XALATAN) 0.005 % ophthalmic solution Place 1 drop into both eyes every other day. At night     meloxicam (MOBIC) 15 MG tablet Take 15 mg by mouth daily.     olmesartan (BENICAR) 40 MG tablet Take 40 mg by mouth daily.     omeprazole (PRILOSEC) 40 MG capsule Take 40 mg by mouth daily.     phenytoin (DILANTIN) 100 MG ER capsule Take 300 mg by mouth daily.     potassium chloride SA (KLOR-CON M) 20 MEQ tablet Take 2 tablets (40 mEq total) by mouth daily.  60 tablet 1   predniSONE (DELTASONE) 5 MG tablet TAKE 1 TABLET BY MOUTH ONCE A DAY WITH BREAKFAST 90 tablet 3   verapamil (VERELAN PM) 360 MG 24 hr capsule Take 360 mg by mouth daily.     No current facility-administered medications for this visit.    REVIEW OF SYSTEMS:   Constitutional: ( - ) fevers, ( - )  chills , ( - ) night sweats Eyes: ( - ) blurriness of vision, ( - ) double vision, ( - ) watery eyes Ears, nose, mouth, throat, and face: ( - ) mucositis, ( - ) sore throat Respiratory: ( - ) cough, ( - ) dyspnea, ( - ) wheezes Cardiovascular: ( - ) palpitation, ( - ) chest discomfort, ( - ) lower extremity swelling Gastrointestinal:  ( - ) nausea, ( - ) heartburn, ( - ) change in bowel habits Skin: ( - ) abnormal skin rashes Lymphatics: ( - ) new lymphadenopathy, ( - ) easy bruising Neurological: ( - ) numbness, ( - ) tingling, ( - ) new weaknesses Behavioral/Psych: ( - ) mood change, ( - ) new changes  All other systems were reviewed with the patient and are negative.  PHYSICAL EXAMINATION: ECOG PERFORMANCE STATUS: 2 - Symptomatic, <50% confined to bed  Vitals:   08/05/22 1521  BP: (!) 144/76  Pulse: 80  Resp: 16  Temp: 97.7 F (36.5 C)  SpO2: 100%     Filed Weights   08/05/22 1521  Weight: 160 lb 6.4 oz (72.8 kg)    Physical Exam Constitutional:      General: He is not in acute distress.    Appearance: Normal appearance.     Comments: Came in wheel chair  HENT:     Head: Normocephalic and atraumatic.  Cardiovascular:     Rate and Rhythm: Normal rate and regular rhythm.     Pulses: Normal pulses.     Heart sounds: Normal heart sounds.  Pulmonary:     Effort: Pulmonary effort is normal.     Breath sounds: Normal breath sounds. No wheezing.  Musculoskeletal:        General: No swelling or tenderness.  Neurological:     General: No focal deficit present.     Mental Status: He is alert.  Psychiatric:        Mood and Affect: Mood normal.       LABORATORY DATA:  I have reviewed the data as listed    Latest Ref Rng & Units 08/05/2022    3:11 PM 07/08/2022  3:09 PM 05/12/2022    3:19 PM  CBC  WBC 4.0 - 10.5 K/uL 8.5  9.8  8.9   Hemoglobin 13.0 - 17.0 g/dL 8.6  9.4  9.8   Hematocrit 39.0 - 52.0 % 25.4  28.0  29.1   Platelets 150 - 400 K/uL 337  356  304        Latest Ref Rng & Units 07/13/2022    3:22 PM 07/08/2022    3:09 PM 05/12/2022    3:19 PM  CMP  Glucose 70 - 99 mg/dL 127  121  127   BUN 8 - 23 mg/dL '15  17  16   '$ Creatinine 0.61 - 1.24 mg/dL 1.09  1.01  1.22   Sodium 135 - 145 mmol/L 138  140  141   Potassium 3.5 - 5.1 mmol/L 2.7  2.4  3.0   Chloride 98 - 111 mmol/L 100  103  104   CO2 22 - 32 mmol/L '30  28  30   '$ Calcium 8.9 - 10.3 mg/dL 8.8  8.9  9.3   Total Protein 6.5 - 8.1 g/dL 7.4  7.9  7.0   Total Bilirubin 0.3 - 1.2 mg/dL 0.4  0.4  0.4   Alkaline Phos 38 - 126 U/L 76  77  73   AST 15 - 41 U/L '11  14  13   '$ ALT 0 - 44 U/L '5  6  5    '$ RADIOGRAPHIC STUDIES: No results found.  ASSESSMENT & PLAN Mildred Bollard 76 y.o. male with medical history significant for metastatic prostate cancer who presents for a follow up visit.  PMH significant for prostate adenocarcinoma Gleason 3+4, no LN involvement s.p robotic prostatectomy in 2013, presented with peri aortic left iliac and peri rectal LN, with biopsy confirming metastatic prostate adenoca now on zytiga and lupron presents here for FU on his metastatic prostate cancer. Baseline PSA of 207.   # Metastatic Castrate Sensitive Prostate Cancer -- continue on Zytiga '1000mg'$  PO daily with prednisone -- He will receive lupron today, due every 3 months.  Next injection due today -- PSA from today is pending, last PSA without any concerns for recurrence. ---He should continue on Zytiga with prednisone and RTC in 8 weeks for FU -- He did not have any bone metastasis hence he is not on bisphosphonate therapy. Return to clinic with me in 8 weeks with repeat  labs.  Hypokalemia, he reassured me that he is taking his prednisone. I encouraged him to take his potassium supplement as recommended.  Right knee pain, He fell and since then noted right leg pain. He had this before as well, resolved spontaneously He has ortho doc who he saw in the past. If the pain doesn't get better, will send him back to Ortho.  No orders of the defined types were placed in this encounter.  All questions were answered. The patient knows to call the clinic with any problems, questions or concerns.  A total of more than 20 minutes were spent on this encounter with face-to-face time and non-face-to-face time, including preparing to see the patient, ordering tests and/or medications, counseling the patient and coordination of care as outlined above.   Benay Pike MD   08/05/2022 3:35 PM

## 2022-08-05 NOTE — Progress Notes (Unsigned)
CRITICAL VALUE  POTASSIUM: 2.7  CALL TO, READ BACK, AND VERIFIED WITH VAL DODD, RN @ [id number] ON 11.15.2023 BY Lawana Pai, MLS

## 2022-08-05 NOTE — Patient Instructions (Signed)
Leuprolide Suspension for Injection (Prostate Cancer) What is this medication? LEUPROLIDE (loo PROE lide) reduces the symptoms of prostate cancer. It works by decreasing levels of the hormone testosterone in the body. This prevents prostate cancer cells from spreading or growing. This medicine may be used for other purposes; ask your health care provider or pharmacist if you have questions. COMMON BRAND NAME(S): Eligard, Lupron Depot, Lupron Depot-Ped, Lutrate Depot, Viadur What should I tell my care team before I take this medication? They need to know if you have any of these conditions: Diabetes Heart disease Heart failure High or low levels of electrolytes, such as magnesium, potassium, or sodium in your blood Irregular heartbeat or rhythm Seizures An unusual or allergic reaction to leuprolide, other medications, foods, dyes, or preservatives Pregnant or trying to get pregnant Breast-feeding How should I use this medication? This medication is injected under the skin or into a muscle. It is given by your care team in a hospital or clinic setting. Talk to your care team about the use of this medication in children. Special care may be needed. Overdosage: If you think you have taken too much of this medicine contact a poison control center or emergency room at once. NOTE: This medicine is only for you. Do not share this medicine with others. What if I miss a dose? Keep appointments for follow-up doses. It is important not to miss your dose. Call your care team if you are unable to keep an appointment. What may interact with this medication? Do not take this medication with any of the following: Cisapride Dronedarone Ketoconazole Levoketoconazole Pimozide Thioridazine This medication may also interact with the following: Other medications that cause heart rhythm changes This list may not describe all possible interactions. Give your health care provider a list of all the medicines,  herbs, non-prescription drugs, or dietary supplements you use. Also tell them if you smoke, drink alcohol, or use illegal drugs. Some items may interact with your medicine. What should I watch for while using this medication? Visit your care team for regular checks on your progress. Tell your care team if your symptoms do not start to get better or if they get worse. This medication may increase blood sugar. The risk may be higher in patients who already have diabetes. Ask your care team what you can do to lower the risk of diabetes while taking this medication. This medication may cause infertility. Talk to your care team if you are concerned about your fertility. Heart attacks and strokes have been reported with the use of this medication. Get emergency help if you develop signs or symptoms of a heart attack or stroke. Talk to your care team about the risks and benefits of this medication. What side effects may I notice from receiving this medication? Side effects that you should report to your care team as soon as possible: Allergic reactions--skin rash, itching, hives, swelling of the face, lips, tongue, or throat Heart attack--pain or tightness in the chest, shoulders, arms, or jaw, nausea, shortness of breath, cold or clammy skin, feeling faint or lightheaded Heart rhythm changes--fast or irregular heartbeat, dizziness, feeling faint or lightheaded, chest pain, trouble breathing High blood sugar (hyperglycemia)--increased thirst or amount of urine, unusual weakness or fatigue, blurry vision Mood swings, irritability, hostility Seizures Stroke--sudden numbness or weakness of the face, arm, or leg, trouble speaking, confusion, trouble walking, loss of balance or coordination, dizziness, severe headache, change in vision Thoughts of suicide or self-harm, worsening mood, feelings of depression Side   effects that usually do not require medical attention (report to your care team if they continue or  are bothersome): Bone pain Change in sex drive or performance General discomfort and fatigue Hot flashes Muscle pain Pain, redness, or irritation at injection site Swelling of the ankles, hands, or feet This list may not describe all possible side effects. Call your doctor for medical advice about side effects. You may report side effects to FDA at 1-800-FDA-1088. Where should I keep my medication? This medication is given in a hospital or clinic. It will not be stored at home. NOTE: This sheet is a summary. It may not cover all possible information. If you have questions about this medicine, talk to your doctor, pharmacist, or health care provider.  2023 Elsevier/Gold Standard (2021-11-19 00:00:00)  

## 2022-08-06 ENCOUNTER — Telehealth: Payer: Self-pay | Admitting: *Deleted

## 2022-08-06 LAB — PSA, TOTAL AND FREE
PSA, Free Pct: UNDETERMINED %
PSA, Free: <0.02 ng/mL
Prostate Specific Ag, Serum: <0.1 ng/mL (ref 0.0–4.0)

## 2022-08-06 NOTE — Telephone Encounter (Signed)
This RN called pt's pharmacy to see if pt has picked up the potassium prescription sent last month post discussion with MD at visit and noted potassium of 2.7.  Per [facility] prescription has not been picked up from order sent 07/16/2022.  This RN called pt and was able to speak with him person to person. Reiterated concern due to potassium level and not taking the replacement medication ordered by the MD.  This RN informed him of serious concern per his health that could be very life threatening if he does not take the prescription medication.  Joseph Rangel stated he understands- asked if the prescription was at the pharmacy (informed him yes) and that he would take it.  This RN validated his response and our concern for his best outcome.

## 2022-08-11 DIAGNOSIS — E7800 Pure hypercholesterolemia, unspecified: Secondary | ICD-10-CM | POA: Diagnosis not present

## 2022-08-11 DIAGNOSIS — M545 Low back pain, unspecified: Secondary | ICD-10-CM | POA: Diagnosis not present

## 2022-08-11 DIAGNOSIS — I10 Essential (primary) hypertension: Secondary | ICD-10-CM | POA: Diagnosis not present

## 2022-08-11 DIAGNOSIS — E559 Vitamin D deficiency, unspecified: Secondary | ICD-10-CM | POA: Diagnosis not present

## 2022-08-11 DIAGNOSIS — Z79899 Other long term (current) drug therapy: Secondary | ICD-10-CM | POA: Diagnosis not present

## 2022-08-20 DIAGNOSIS — Z79899 Other long term (current) drug therapy: Secondary | ICD-10-CM | POA: Diagnosis not present

## 2022-09-01 ENCOUNTER — Ambulatory Visit: Payer: Medicare HMO

## 2022-09-01 ENCOUNTER — Inpatient Hospital Stay: Payer: Medicare HMO | Admitting: Hematology and Oncology

## 2022-09-01 ENCOUNTER — Inpatient Hospital Stay: Payer: Medicare HMO | Attending: Hematology and Oncology

## 2022-09-08 DIAGNOSIS — R569 Unspecified convulsions: Secondary | ICD-10-CM | POA: Diagnosis not present

## 2022-09-08 DIAGNOSIS — I10 Essential (primary) hypertension: Secondary | ICD-10-CM | POA: Diagnosis not present

## 2022-09-08 DIAGNOSIS — Z720 Tobacco use: Secondary | ICD-10-CM | POA: Diagnosis not present

## 2022-09-08 DIAGNOSIS — M150 Primary generalized (osteo)arthritis: Secondary | ICD-10-CM | POA: Diagnosis not present

## 2022-09-08 DIAGNOSIS — E785 Hyperlipidemia, unspecified: Secondary | ICD-10-CM | POA: Diagnosis not present

## 2022-09-08 DIAGNOSIS — G619 Inflammatory polyneuropathy, unspecified: Secondary | ICD-10-CM | POA: Diagnosis not present

## 2022-09-08 DIAGNOSIS — Z6823 Body mass index (BMI) 23.0-23.9, adult: Secondary | ICD-10-CM | POA: Diagnosis not present

## 2022-09-10 DIAGNOSIS — E7800 Pure hypercholesterolemia, unspecified: Secondary | ICD-10-CM | POA: Diagnosis not present

## 2022-09-10 DIAGNOSIS — I10 Essential (primary) hypertension: Secondary | ICD-10-CM | POA: Diagnosis not present

## 2022-09-10 DIAGNOSIS — R030 Elevated blood-pressure reading, without diagnosis of hypertension: Secondary | ICD-10-CM | POA: Diagnosis not present

## 2022-09-10 DIAGNOSIS — E559 Vitamin D deficiency, unspecified: Secondary | ICD-10-CM | POA: Diagnosis not present

## 2022-09-10 DIAGNOSIS — M545 Low back pain, unspecified: Secondary | ICD-10-CM | POA: Diagnosis not present

## 2022-09-10 DIAGNOSIS — Z79899 Other long term (current) drug therapy: Secondary | ICD-10-CM | POA: Diagnosis not present

## 2022-09-17 DIAGNOSIS — Z79899 Other long term (current) drug therapy: Secondary | ICD-10-CM | POA: Diagnosis not present

## 2022-09-19 ENCOUNTER — Telehealth: Payer: Self-pay | Admitting: Hematology and Oncology

## 2022-09-19 NOTE — Telephone Encounter (Signed)
Contacted patient to scheduled appointments. Left message with appointment details and a call back number if patient had any questions or could not accommodate the time we provided.   

## 2022-09-22 ENCOUNTER — Encounter: Payer: Self-pay | Admitting: Hematology and Oncology

## 2022-09-25 ENCOUNTER — Other Ambulatory Visit (HOSPITAL_COMMUNITY): Payer: Self-pay

## 2022-09-28 ENCOUNTER — Other Ambulatory Visit: Payer: Self-pay | Admitting: *Deleted

## 2022-09-28 MED ORDER — ABIRATERONE ACETATE 250 MG PO TABS: 1000.0000 mg | ORAL_TABLET | Freq: Every day | ORAL | 2 refills | Status: DC

## 2022-09-29 ENCOUNTER — Inpatient Hospital Stay: Payer: Medicare PPO | Admitting: Adult Health

## 2022-09-29 ENCOUNTER — Inpatient Hospital Stay: Payer: Medicare PPO

## 2022-10-01 ENCOUNTER — Inpatient Hospital Stay (HOSPITAL_BASED_OUTPATIENT_CLINIC_OR_DEPARTMENT_OTHER): Payer: Medicare PPO | Admitting: Adult Health

## 2022-10-01 ENCOUNTER — Other Ambulatory Visit: Payer: Self-pay

## 2022-10-01 ENCOUNTER — Inpatient Hospital Stay: Payer: Medicare PPO | Attending: Hematology and Oncology

## 2022-10-01 VITALS — BP 185/91 | HR 85 | Temp 97.9°F | Resp 18 | Ht 69.0 in | Wt 169.0 lb

## 2022-10-01 DIAGNOSIS — C772 Secondary and unspecified malignant neoplasm of intra-abdominal lymph nodes: Secondary | ICD-10-CM

## 2022-10-01 DIAGNOSIS — C61 Malignant neoplasm of prostate: Secondary | ICD-10-CM

## 2022-10-01 DIAGNOSIS — F17210 Nicotine dependence, cigarettes, uncomplicated: Secondary | ICD-10-CM | POA: Diagnosis not present

## 2022-10-01 DIAGNOSIS — E876 Hypokalemia: Secondary | ICD-10-CM

## 2022-10-01 DIAGNOSIS — I10 Essential (primary) hypertension: Secondary | ICD-10-CM | POA: Insufficient documentation

## 2022-10-01 LAB — COMPREHENSIVE METABOLIC PANEL
ALT: 5 U/L (ref 0–44)
AST: 12 U/L — ABNORMAL LOW (ref 15–41)
Albumin: 4 g/dL (ref 3.5–5.0)
Alkaline Phosphatase: 107 U/L (ref 38–126)
Anion gap: 9 (ref 5–15)
BUN: 13 mg/dL (ref 8–23)
CO2: 23 mmol/L (ref 22–32)
Calcium: 9.1 mg/dL (ref 8.9–10.3)
Chloride: 108 mmol/L (ref 98–111)
Creatinine, Ser: 0.98 mg/dL (ref 0.61–1.24)
GFR, Estimated: >60 mL/min (ref >60)
Glucose, Bld: 94 mg/dL (ref 70–99)
Potassium: 3.7 mmol/L (ref 3.5–5.1)
Sodium: 140 mmol/L (ref 135–145)
Total Bilirubin: 0.3 mg/dL (ref 0.3–1.2)
Total Protein: 7.1 g/dL (ref 6.5–8.1)

## 2022-10-01 LAB — CBC WITH DIFFERENTIAL/PLATELET
Abs Immature Granulocytes: 0.04 10*3/uL (ref 0.00–0.07)
Basophils Absolute: 0.1 10*3/uL (ref 0.0–0.1)
Basophils Relative: 1 %
Eosinophils Absolute: 0.3 10*3/uL (ref 0.0–0.5)
Eosinophils Relative: 4 %
HCT: 23.8 % — ABNORMAL LOW (ref 39.0–52.0)
Hemoglobin: 8 g/dL — ABNORMAL LOW (ref 13.0–17.0)
Immature Granulocytes: 1 %
Lymphocytes Relative: 21 %
Lymphs Abs: 1.8 10*3/uL (ref 0.7–4.0)
MCH: 28.7 pg (ref 26.0–34.0)
MCHC: 33.6 g/dL (ref 30.0–36.0)
MCV: 85.3 fL (ref 80.0–100.0)
Monocytes Absolute: 0.6 10*3/uL (ref 0.1–1.0)
Monocytes Relative: 7 %
Neutro Abs: 5.8 10*3/uL (ref 1.7–7.7)
Neutrophils Relative %: 66 %
Platelets: 256 10*3/uL (ref 150–400)
RBC: 2.79 MIL/uL — ABNORMAL LOW (ref 4.22–5.81)
RDW: 13.5 % (ref 11.5–15.5)
WBC: 8.6 10*3/uL (ref 4.0–10.5)
nRBC: 0 % (ref 0.0–0.2)

## 2022-10-01 NOTE — Patient Instructions (Signed)

## 2022-10-02 LAB — PSA, TOTAL AND FREE
PSA, Free Pct: UNDETERMINED %
PSA, Free: <0.02 ng/mL
Prostate Specific Ag, Serum: <0.1 ng/mL (ref 0.0–4.0)

## 2022-10-04 ENCOUNTER — Encounter: Payer: Self-pay | Admitting: Hematology and Oncology

## 2022-10-04 NOTE — Progress Notes (Signed)
[facility] Follow up:    Joseph Rangel, [address] [place] [address]   DIAGNOSIS:  Cancer Staging  Prostate cancer metastatic to intraabdominal lymph node [facility]) Staging form: Prostate, AJCC 8th Edition - Clinical: Stage IVB (cTX, cNX, pM1a, PSA: 207) - Signed by Benay Pike, MD on 10/02/2020 Prostate specific antigen (PSA) range: 20 or greater   SUMMARY OF ONCOLOGIC HISTORY: Oncology History Overview Note  Joseph Rangel 77 y.o. male is here because of some abnormal lymphadenopathy identified on CT angiogram especially given his history of prostate cancer.  He had robotic-assisted laparoscopic radical retropubic prostatectomy with bilateral pelvic lymph node dissection for a T1c prostate adenocarcinoma. Final pathology from this surgery showed prostatic adenocarcinoma , gleasons score 3+4=7, involving both lobes, no evidence of angiolymphatic invasion, extraprostatic extension or seminal vesicle involvement. All resection margins are clear.  CT showed periaortic, left iliac, peri rectal LAD concerning for metastatic disease, and hence referred to Oncology.  He no showed to his first appointment given lack of transport, and came an hr late for his second appointment. He is here with his wife, he tells me that some LN were enlarged on a CT scan and his doctor wants to make sure he doesn't have cancer again. He denies any changes to his pain, has chronic back pain and pain in his legs for which he takes pain medication and FU with his PCP and pain management. PSA of 207, PET imaging with adenopathy in the abdomen and pelvis in this patient with elevated PSA showing low level FDG uptake most suggestive of prostate metastases. Other disease processes that could have this distribution rectal cancer and lymphoma. The lack of pronounced FDG uptake in these lymph nodes and the reported PSA level make these less likely. Biopsy of RPLN confirmed prostate  adenoca. He started taking casodex and had his first lupron on 10/18/2020 He started taking zytiga but was only taking one tab a day, despite instructions to take 4 a day. Although there is some new literature suggesting to take one tab if you eat a fatty meal, it is not reliable if he would end up eating a fatty meal every day, and also give concomitant use of phenytoin which is an inducer, in theory he may need more than 4 tabs a day. He is on Zytiga 1000 mg po daily with prednisone.   Prostate cancer metastatic to intraabdominal lymph node ([place])  10/02/2020 Initial Diagnosis   Prostate cancer metastatic to intraabdominal lymph node ([place])   10/02/2020 Cancer Staging   Staging form: Prostate, AJCC 8th Edition - Clinical: Stage IVB (cTX, cNX, pM1a, PSA: 207) - Signed by Benay Pike, MD on 10/02/2020     CURRENT THERAPY:Eligard; Fabio Asa  INTERVAL HISTORY: Joseph Rangel 77 y.o. male returns for follow up of his prostate cancer on treatment with Zytiga. He tells me that he is feeling moderately well.  He denies any increased shortness of breath, cough, headaches, dizziness, or any other concerns.    Patient Active Problem List   Diagnosis Date Noted   Normocytic normochromic anemia 06/25/2021   Hypokalemia 03/07/2021   AKI (acute kidney injury) ([place]) 01/31/2021   Right leg pain 01/31/2021   Prostate cancer metastatic to intraabdominal lymph node ([place]) 10/02/2020    has No Known Allergies.  MEDICAL HISTORY: Past Medical History:  Diagnosis Date   Arthritis    Cancer [facility])    prostate    Frequency of urination  Hypertension    Seizures ([place])    after brain surg - none in past 20 yrs   Speech disorder    due to brain surg    SURGICAL HISTORY: Past Surgical History:  Procedure Laterality Date   BRAIN SURGERY     BLOOD CLOT REMOVED 1980   MASS EXCISION  02/24/2012   Procedure: MINOR EXCISION OF MASS;  Surgeon: Bernestine Amass, MD;  Location: [facility];  Service: Urology;;   excision of neck lesion   ROBOT ASSISTED LAPAROSCOPIC RADICAL PROSTATECTOMY  02/24/2012   Procedure: ROBOTIC ASSISTED LAPAROSCOPIC RADICAL PROSTATECTOMY;  Surgeon: Bernestine Amass, MD;  Location: [facility];  Service: Urology;  Laterality: N/A;           SOCIAL HISTORY: Social History   Socioeconomic History   Marital status: Married    Spouse name: Not on file   Number of children: Not on file   Years of education: Not on file   Highest education level: Not on file  Occupational History   Not on file  Tobacco Use   Smoking status: Every Day    Types: Cigarettes   Smokeless tobacco: Never   Tobacco comments:    2 cigarettes per day  Vaping Use   Vaping Use: Never used  Substance and Sexual Activity   Alcohol use: No   Drug use: No   Sexual activity: Not on file  Other Topics Concern   Not on file  Social History Narrative   Not on file   Social Determinants of Health   Financial Resource Strain: Not on file  Food Insecurity: Not on file  Transportation Needs: Not on file  Physical Activity: Not on file  Stress: Not on file  Social Connections: Not on file  Intimate Partner Violence: Not on file    FAMILY HISTORY: No family history on file.  Review of Systems  Constitutional:  Negative for appetite change, chills, fatigue, fever and unexpected weight change.  HENT:   Negative for hearing loss, lump/mass and trouble swallowing.   Eyes:  Negative for eye problems and icterus.  Respiratory:  Negative for chest tightness, cough and shortness of breath.   Cardiovascular:  Negative for chest pain, leg swelling and palpitations.  Gastrointestinal:  Negative for abdominal distention, abdominal pain, constipation, diarrhea, nausea and vomiting.  Endocrine: Negative for hot flashes.  Genitourinary:  Negative for difficulty urinating.   Musculoskeletal:  Negative for arthralgias.  Skin:  Negative for itching and rash.  Neurological:  Negative for dizziness, extremity  weakness, headaches and numbness.  Hematological:  Negative for adenopathy. Does not bruise/bleed easily.  Psychiatric/Behavioral:  Negative for depression. The patient is not nervous/anxious.       PHYSICAL EXAMINATION  ECOG PERFORMANCE STATUS: 0 - Asymptomatic  Vitals:   10/01/22 1510  BP: (!) 185/91  Pulse: 85  Resp: 18  Temp: 97.9 F (36.6 C)  SpO2: 100%    Physical Exam Constitutional:      General: He is not in acute distress.    Appearance: Normal appearance. He is not toxic-appearing.  HENT:     Head: Normocephalic and atraumatic.  Eyes:     General: No scleral icterus. Cardiovascular:     Rate and Rhythm: Normal rate and regular rhythm.     Pulses: Normal pulses.     Heart sounds: Normal heart sounds.  Pulmonary:     Effort: Pulmonary effort is normal.     Breath sounds: Normal breath sounds.  Abdominal:  General: Abdomen is flat. Bowel sounds are normal. There is no distension.     Palpations: Abdomen is soft.     Tenderness: There is no abdominal tenderness.  Musculoskeletal:        General: No swelling.     Cervical back: Neck supple.  Lymphadenopathy:     Cervical: No cervical adenopathy.  Skin:    General: Skin is warm and dry.     Findings: No rash.  Neurological:     General: No focal deficit present.     Mental Status: He is alert.  Psychiatric:        Mood and Affect: Mood normal.        Behavior: Behavior normal.     LABORATORY DATA:  CBC    Component Value Date/Time   WBC 8.6 10/01/2022 1433   RBC 2.79 (L) 10/01/2022 1433   HGB 8.0 (L) 10/01/2022 1433   HGB 8.6 (L) 08/05/2022 1511   HCT 23.8 (L) 10/01/2022 1433   HCT 32.5 (L) 08/09/2020 1415   PLT 256 10/01/2022 1433   PLT 337 08/05/2022 1511   MCV 85.3 10/01/2022 1433   MCH 28.7 10/01/2022 1433   MCHC 33.6 10/01/2022 1433   RDW 13.5 10/01/2022 1433   LYMPHSABS 1.8 10/01/2022 1433   MONOABS 0.6 10/01/2022 1433   EOSABS 0.3 10/01/2022 1433   BASOSABS 0.1 10/01/2022 1433     CMP     Component Value Date/Time   NA 140 10/01/2022 1433   K 3.7 10/01/2022 1433   CL 108 10/01/2022 1433   CO2 23 10/01/2022 1433   GLUCOSE 94 10/01/2022 1433   BUN 13 10/01/2022 1433   CREATININE 0.98 10/01/2022 1433   CREATININE 1.21 08/05/2022 1511   CALCIUM 9.1 10/01/2022 1433   PROT 7.1 10/01/2022 1433   ALBUMIN 4.0 10/01/2022 1433   AST 12 (L) 10/01/2022 1433   AST 15 08/05/2022 1511   ALT 5 10/01/2022 1433   ALT 8 08/05/2022 1511   ALKPHOS 107 10/01/2022 1433   BILITOT 0.3 10/01/2022 1433   BILITOT 0.4 08/05/2022 1511   GFRNONAA >60 10/01/2022 1433   GFRNONAA >60 08/05/2022 1511   GFRAA >90 02/25/2012 0447    ASSESSMENT and THERAPY PLAN:   Prostate cancer metastatic to intraabdominal lymph node (HCC) Joseph Rangel is a 77 year old man with stage IV prostate cancer on treatment with Eligard and Zytiga and will continue. He is doing well today.  He is tolerating treatment well and is at baseline.  His labs are stable.  He is anemic, but asymptomatic.  We will monitor his hemoglobin closely.   Pilot will return in 4 weeks for labs, f/u, and his next Eligard injection. I gave him a handout on anemia and reasons to call our office or seek immediate care.      All questions were answered. The patient knows to call the clinic with any problems, questions or concerns. We can certainly see the patient much sooner if necessary.  Total encounter time:20 minutes*in face-to-face visit time, chart review, lab review, care coordination, order entry, and documentation of the encounter time.    Wilber Bihari, NP 10/04/22 10:01 PM Medical Oncology and Hematology [facility] [place], [place] [address] Tel. [phone]    Fax. [phone]  *Total Encounter Time as defined by the Centers for Medicare and Medicaid Services includes, in addition to the face-to-face time of a patient visit (documented in the note above) non-face-to-face time: obtaining  and  reviewing outside history, ordering and reviewing medications, tests or procedures, care coordination (communications with other health care professionals or caregivers) and documentation in the medical record.

## 2022-10-04 NOTE — Assessment & Plan Note (Addendum)
Joseph Rangel is a 77 year old man with stage IV prostate cancer on treatment with Eligard and Zytiga and will continue. He is doing well today.  He is tolerating treatment well and is at baseline.  His labs are stable.  He is anemic, but asymptomatic.  We will monitor his hemoglobin closely.   Joseph Rangel will return in 4 weeks for labs, f/u, and his next Eligard injection. I gave him a handout on anemia and reasons to call our office or seek immediate care.

## 2022-10-28 ENCOUNTER — Other Ambulatory Visit: Payer: Self-pay | Admitting: *Deleted

## 2022-10-28 DIAGNOSIS — D649 Anemia, unspecified: Secondary | ICD-10-CM

## 2022-10-28 DIAGNOSIS — R590 Localized enlarged lymph nodes: Secondary | ICD-10-CM

## 2022-10-28 DIAGNOSIS — C772 Secondary and unspecified malignant neoplasm of intra-abdominal lymph nodes: Secondary | ICD-10-CM

## 2022-10-29 ENCOUNTER — Inpatient Hospital Stay: Payer: Medicare PPO | Attending: Hematology and Oncology

## 2022-10-29 ENCOUNTER — Inpatient Hospital Stay: Payer: Medicare PPO

## 2022-10-29 ENCOUNTER — Other Ambulatory Visit: Payer: Self-pay

## 2022-10-29 ENCOUNTER — Inpatient Hospital Stay (HOSPITAL_BASED_OUTPATIENT_CLINIC_OR_DEPARTMENT_OTHER): Payer: Medicare PPO | Admitting: Hematology and Oncology

## 2022-10-29 VITALS — BP 151/93 | HR 86 | Temp 97.7°F | Resp 16 | Ht 69.0 in | Wt 165.4 lb

## 2022-10-29 DIAGNOSIS — C772 Secondary and unspecified malignant neoplasm of intra-abdominal lymph nodes: Secondary | ICD-10-CM

## 2022-10-29 DIAGNOSIS — Z79899 Other long term (current) drug therapy: Secondary | ICD-10-CM | POA: Insufficient documentation

## 2022-10-29 DIAGNOSIS — M129 Arthropathy, unspecified: Secondary | ICD-10-CM | POA: Insufficient documentation

## 2022-10-29 DIAGNOSIS — R42 Dizziness and giddiness: Secondary | ICD-10-CM | POA: Insufficient documentation

## 2022-10-29 DIAGNOSIS — D649 Anemia, unspecified: Secondary | ICD-10-CM

## 2022-10-29 DIAGNOSIS — Z7902 Long term (current) use of antithrombotics/antiplatelets: Secondary | ICD-10-CM | POA: Insufficient documentation

## 2022-10-29 DIAGNOSIS — F17210 Nicotine dependence, cigarettes, uncomplicated: Secondary | ICD-10-CM | POA: Insufficient documentation

## 2022-10-29 DIAGNOSIS — R5383 Other fatigue: Secondary | ICD-10-CM | POA: Insufficient documentation

## 2022-10-29 DIAGNOSIS — I10 Essential (primary) hypertension: Secondary | ICD-10-CM | POA: Insufficient documentation

## 2022-10-29 DIAGNOSIS — C61 Malignant neoplasm of prostate: Secondary | ICD-10-CM

## 2022-10-29 DIAGNOSIS — Z79818 Long term (current) use of other agents affecting estrogen receptors and estrogen levels: Secondary | ICD-10-CM | POA: Diagnosis not present

## 2022-10-29 DIAGNOSIS — Z791 Long term (current) use of non-steroidal anti-inflammatories (NSAID): Secondary | ICD-10-CM | POA: Insufficient documentation

## 2022-10-29 DIAGNOSIS — E538 Deficiency of other specified B group vitamins: Secondary | ICD-10-CM | POA: Insufficient documentation

## 2022-10-29 LAB — CMP (CANCER CENTER ONLY)
ALT: 5 U/L (ref 0–44)
AST: 10 U/L — ABNORMAL LOW (ref 15–41)
Albumin: 3.9 g/dL (ref 3.5–5.0)
Alkaline Phosphatase: 140 U/L — ABNORMAL HIGH (ref 38–126)
Anion gap: 8 (ref 5–15)
BUN: 16 mg/dL (ref 8–23)
CO2: 22 mmol/L (ref 22–32)
Calcium: 9 mg/dL (ref 8.9–10.3)
Chloride: 107 mmol/L (ref 98–111)
Creatinine: 1.02 mg/dL (ref 0.61–1.24)
GFR, Estimated: >60 mL/min (ref >60)
Glucose, Bld: 128 mg/dL — ABNORMAL HIGH (ref 70–99)
Potassium: 4 mmol/L (ref 3.5–5.1)
Sodium: 137 mmol/L (ref 135–145)
Total Bilirubin: 0.3 mg/dL (ref 0.3–1.2)
Total Protein: 7 g/dL (ref 6.5–8.1)

## 2022-10-29 LAB — CBC WITH DIFFERENTIAL (CANCER CENTER ONLY)
Abs Immature Granulocytes: 0.03 10*3/uL (ref 0.00–0.07)
Basophils Absolute: 0.1 10*3/uL (ref 0.0–0.1)
Basophils Relative: 1 %
Eosinophils Absolute: 0.1 10*3/uL (ref 0.0–0.5)
Eosinophils Relative: 1 %
HCT: 25.6 % — ABNORMAL LOW (ref 39.0–52.0)
Hemoglobin: 8.3 g/dL — ABNORMAL LOW (ref 13.0–17.0)
Immature Granulocytes: 0 %
Lymphocytes Relative: 18 %
Lymphs Abs: 1.5 10*3/uL (ref 0.7–4.0)
MCH: 27.1 pg (ref 26.0–34.0)
MCHC: 32.4 g/dL (ref 30.0–36.0)
MCV: 83.7 fL (ref 80.0–100.0)
Monocytes Absolute: 0.4 10*3/uL (ref 0.1–1.0)
Monocytes Relative: 5 %
Neutro Abs: 6.1 10*3/uL (ref 1.7–7.7)
Neutrophils Relative %: 75 %
Platelet Count: 303 10*3/uL (ref 150–400)
RBC: 3.06 MIL/uL — ABNORMAL LOW (ref 4.22–5.81)
RDW: 13.4 % (ref 11.5–15.5)
WBC Count: 8.2 10*3/uL (ref 4.0–10.5)
nRBC: 0 % (ref 0.0–0.2)

## 2022-10-29 LAB — VITAMIN B12: Vitamin B-12: 111 pg/mL — ABNORMAL LOW (ref 180–914)

## 2022-10-29 LAB — IRON AND IRON BINDING CAPACITY (CC-WL,HP ONLY)
Iron: 22 ug/dL — ABNORMAL LOW (ref 45–182)
Saturation Ratios: 7 % — ABNORMAL LOW (ref 17.9–39.5)
TIBC: 312 ug/dL (ref 250–450)
UIBC: 290 ug/dL (ref 117–376)

## 2022-10-29 MED ORDER — LEUPROLIDE ACETATE (3 MONTH) 22.5 MG IM KIT: 22.5000 mg | PACK | Freq: Once | INTRAMUSCULAR | Status: DC

## 2022-10-29 MED ORDER — LEUPROLIDE ACETATE (3 MONTH) 22.5 MG ~~LOC~~ KIT
22.5000 mg | PACK | Freq: Once | SUBCUTANEOUS | Status: AC
  Administered 2022-10-29: 22.5 mg via SUBCUTANEOUS
  Filled 2022-10-29: qty 22.5

## 2022-10-29 NOTE — Progress Notes (Signed)
[facility] Telephone:([phone]   [phone]  PROGRESS NOTE  Patient Care Team: Iona Beard, MD as PCP - General (Family Medicine)  Hematological/Oncological History  # Metastatic Prostate Cancer w/ Spread to Intraabdominal Lymph Nodes --previously followed by Dr. Chryl Heck. Currently on abiraterone therapy 06/18/2021: PSA <0.01  Interval History:   Joseph Rangel 77 y.o. male with medical history significant for metastatic prostate cancer who presents for a follow up visit.   He arrived today with his daughter. He says he is feeling more tired. He denies any blood in stool or black stool but he doesn't really pay attention to his stool. He is compliant with his Zytiga and prednisone.  Rest of the pertinent 10 point ROS reviewed and negative.  MEDICAL HISTORY:  Past Medical History:  Diagnosis Date   Arthritis    Cancer [facility])    prostate    Frequency of urination    Hypertension    Seizures ([place])    after brain surg - none in past 20 yrs   Speech disorder    due to brain surg    SURGICAL HISTORY: Past Surgical History:  Procedure Laterality Date   BRAIN SURGERY     BLOOD CLOT REMOVED 1980   MASS EXCISION  02/24/2012   Procedure: MINOR EXCISION OF MASS;  Surgeon: Bernestine Amass, MD;  [facility];  Service: Urology;;  excision of neck lesion   ROBOT ASSISTED LAPAROSCOPIC RADICAL PROSTATECTOMY  02/24/2012   Procedure: ROBOTIC ASSISTED LAPAROSCOPIC RADICAL PROSTATECTOMY;  Surgeon: Bernestine Amass, MD;  Location: [facility];  Service: Urology;  Laterality: N/A;           SOCIAL HISTORY: Social History   Socioeconomic History   Marital status: Married    Spouse name: Not on file   Number of children: Not on file   Years of education: Not on file   Highest education level: Not on file  Occupational History   Not on file  Tobacco Use   Smoking status: Every Day    Types: Cigarettes   Smokeless tobacco: Never   Tobacco comments:    2  cigarettes per day  Vaping Use   Vaping Use: Never used  Substance and Sexual Activity   Alcohol use: No   Drug use: No   Sexual activity: Not on file  Other Topics Concern   Not on file  Social History Narrative   Not on file   Social Determinants of Health   Financial Resource Strain: Not on file  Food Insecurity: Not on file  Transportation Needs: Not on file  Physical Activity: Not on file  Stress: Not on file  Social Connections: Not on file  Intimate Partner Violence: Not on file    FAMILY HISTORY: No family history on file.  ALLERGIES:  has No Known Allergies.  MEDICATIONS:  Current Outpatient Medications  Medication Sig Dispense Refill   abiraterone acetate (ZYTIGA) 250 MG tablet TAKE 4 TABLETS (1,000 MG TOTAL) BY MOUTH DAILY. TAKE ON AN EMPTY STOMACH 1 HOUR BEFORE OR 2 HOURS AFTER A MEAL 120 tablet 2   atenolol (TENORMIN) 25 MG tablet Take 25 mg by mouth daily.     buprenorphine (BUTRANS) 7.5 MCG/HR  (Patient not taking: Reported on 08/20/2021)     cilostazol (PLETAL) 50 MG tablet Take 50 mg by mouth daily.     fenofibrate (TRICOR) 48 MG tablet Take 48 mg by mouth daily.     gabapentin (NEURONTIN) 100 MG capsule Take  by mouth.     hydrochlorothiazide (HYDRODIURIL) 25 MG tablet Take 25 mg by mouth daily.     HYDROcodone-acetaminophen (NORCO) 10-325 MG tablet Take 1 tablet by mouth every 4 (four) hours as needed for moderate pain.     latanoprost (XALATAN) 0.005 % ophthalmic solution Place 1 drop into both eyes every other day. At night     meloxicam (MOBIC) 15 MG tablet Take 15 mg by mouth daily.     olmesartan (BENICAR) 40 MG tablet Take 40 mg by mouth daily.     omeprazole (PRILOSEC) 40 MG capsule Take 40 mg by mouth daily.     phenytoin (DILANTIN) 100 MG ER capsule Take 300 mg by mouth daily.     potassium chloride SA (KLOR-CON M) 20 MEQ tablet Take 2 tablets (40 mEq total) by mouth daily. 60 tablet 1   predniSONE (DELTASONE) 5 MG tablet TAKE 1 TABLET BY MOUTH  ONCE A DAY WITH BREAKFAST 90 tablet 3   verapamil (VERELAN PM) 360 MG 24 hr capsule Take 360 mg by mouth daily.     No current facility-administered medications for this visit.    REVIEW OF SYSTEMS:   Constitutional: ( - ) fevers, ( - )  chills , ( - ) night sweats Eyes: ( - ) blurriness of vision, ( - ) double vision, ( - ) watery eyes Ears, nose, mouth, throat, and face: ( - ) mucositis, ( - ) sore throat Respiratory: ( - ) cough, ( - ) dyspnea, ( - ) wheezes Cardiovascular: ( - ) palpitation, ( - ) chest discomfort, ( - ) lower extremity swelling Gastrointestinal:  ( - ) nausea, ( - ) heartburn, ( - ) change in bowel habits Skin: ( - ) abnormal skin rashes Lymphatics: ( - ) new lymphadenopathy, ( - ) easy bruising Neurological: ( - ) numbness, ( - ) tingling, ( - ) new weaknesses Behavioral/Psych: ( - ) mood change, ( - ) new changes  All other systems were reviewed with the patient and are negative.  PHYSICAL EXAMINATION: ECOG PERFORMANCE STATUS: 2 - Symptomatic, <50% confined to bed  Vitals:   10/29/22 1519  BP: (!) 151/93  Pulse: 86  Resp: 16  Temp: 97.7 F (36.5 C)  SpO2: 100%     Filed Weights   10/29/22 1519  Weight: 165 lb 6.4 oz (75 kg)    Physical Exam Constitutional:      General: He is not in acute distress.    Appearance: Normal appearance.     Comments: Came in wheel chair  HENT:     Head: Normocephalic and atraumatic.  Cardiovascular:     Rate and Rhythm: Normal rate and regular rhythm.     Pulses: Normal pulses.     Heart sounds: Normal heart sounds.  Pulmonary:     Effort: Pulmonary effort is normal.     Breath sounds: Normal breath sounds. No wheezing.  Musculoskeletal:        General: No swelling or tenderness.  Neurological:     General: No focal deficit present.     Mental Status: He is alert.  Psychiatric:        Mood and Affect: Mood normal.      LABORATORY DATA:  I have reviewed the data as listed    Latest Ref Rng & Units  10/29/2022    2:45 PM 10/01/2022    2:33 PM 08/05/2022    3:11 PM  CBC  WBC 4.0 - 10.5 K/uL  8.2  8.6  8.5   Hemoglobin 13.0 - 17.0 g/dL 8.3  8.0  8.6   Hematocrit 39.0 - 52.0 % 25.6  23.8  25.4   Platelets 150 - 400 K/uL 303  256  337        Latest Ref Rng & Units 10/29/2022    2:45 PM 10/01/2022    2:33 PM 08/05/2022    3:11 PM  CMP  Glucose 70 - 99 mg/dL 128  94  131   BUN 8 - 23 mg/dL 16  13  17   $ Creatinine 0.61 - 1.24 mg/dL 1.02  0.98  1.21   Sodium 135 - 145 mmol/L 137  140  136   Potassium 3.5 - 5.1 mmol/L 4.0  3.7  2.7   Chloride 98 - 111 mmol/L 107  108  100   CO2 22 - 32 mmol/L 22  23  28   $ Calcium 8.9 - 10.3 mg/dL 9.0  9.1  8.5   Total Protein 6.5 - 8.1 g/dL 7.0  7.1  7.3   Total Bilirubin 0.3 - 1.2 mg/dL 0.3  0.3  0.4   Alkaline Phos 38 - 126 U/L 140  107  122   AST 15 - 41 U/L 10  12  15   $ ALT 0 - 44 U/L 5  5  8    $ RADIOGRAPHIC STUDIES: No results found.  ASSESSMENT & PLAN Joseph Rangel 77 y.o. male with medical history significant for metastatic prostate cancer who presents for a follow up visit.  PMH significant for prostate adenocarcinoma Gleason 3+4, no LN involvement s.p robotic prostatectomy in 2013, presented with peri aortic left iliac and peri rectal LN, with biopsy confirming metastatic prostate adenoca now on zytiga and lupron presents here for FU on his metastatic prostate cancer. Baseline PSA of 207.   # Metastatic Castrate Sensitive Prostate Cancer -- continue on Zytiga 1020m PO daily with prednisone -- He will receive lupron today, due every 3 months.  -- PSA from today is pending, last PSA without any concerns for recurrence. ---He should continue on Zytiga with prednisone and RTC in 8 weeks for FU -- He did not have any bone metastasis hence he is not on bisphosphonate therapy. Return to clinic with me in 8 weeks with repeat labs.  # Normocytic normochromic anemia, Hb 8.  He denies any blood loss or melena, but he is not quite sure about the  stool. Labs today ferritin of 10. We will consider IV iron replacement, he unfortunately is not very reliable with oral iron replacement. We will recommend GI referral as well.  # B12 deficiency, B12 inj ordered.   Orders Placed This Encounter  Procedures   Sample to Blood Bank    Standing Status:   Future    Standing Expiration Date:   10/30/2023    All questions were answered. The patient knows to call the clinic with any problems, questions or concerns.  A total of more than 30 minutes were spent on this encounter with face-to-face time and non-face-to-face time, including preparing to see the patient, ordering tests and/or medications, counseling the patient and coordination of care as outlined above.   PBenay PikeMD   10/29/2022 3:47 PM

## 2022-10-30 ENCOUNTER — Encounter: Payer: Self-pay | Admitting: Hematology and Oncology

## 2022-10-30 ENCOUNTER — Other Ambulatory Visit: Payer: Self-pay | Admitting: Adult Health

## 2022-10-30 ENCOUNTER — Telehealth: Payer: Self-pay

## 2022-10-30 LAB — FERRITIN: Ferritin: 10 ng/mL — ABNORMAL LOW (ref 24–336)

## 2022-10-30 NOTE — Progress Notes (Signed)
B12 and Venofer protocols added for patients b12 and iron deficiency noted on labs.  Patient will f/u with Dr. Chryl Heck to review in further detail.  My nurse Shelda Pal is helping get this arranged.    Wilber Bihari, NP 10/30/22 1:48 PM Medical Oncology and Hematology [facility] [place], [place] [address] Tel. [phone]    Fax. [phone]

## 2022-10-30 NOTE — Telephone Encounter (Signed)
-----   Message from Gardenia Phlegm, NP sent at 10/30/2022  1:32 PM EST ----- Patient needs IV iron and b12 injections.  Will you please call and let him know so we can set up appointment and infusion to discuss further? ----- Message ----- From: Interface, [facility] [place] Sent: 10/29/2022   4:44 PM EST To: Gardenia Phlegm, NP

## 2022-10-30 NOTE — Telephone Encounter (Signed)
Called Pt and given below message. Discussed with Pt that schedulers will be back in touch regarding appt times. Pt verbalized understanding.

## 2022-10-31 LAB — PSA, TOTAL AND FREE
PSA, Free Pct: UNDETERMINED %
PSA, Free: <0.02 ng/mL
Prostate Specific Ag, Serum: <0.1 ng/mL (ref 0.0–4.0)

## 2022-11-02 ENCOUNTER — Telehealth: Payer: Self-pay | Admitting: Hematology and Oncology

## 2022-11-02 NOTE — Telephone Encounter (Signed)
Spoke with patient wife confirming appointment changes and upcoming appointments

## 2022-11-03 ENCOUNTER — Other Ambulatory Visit: Payer: Self-pay

## 2022-11-03 DIAGNOSIS — C61 Malignant neoplasm of prostate: Secondary | ICD-10-CM

## 2022-11-05 ENCOUNTER — Inpatient Hospital Stay: Payer: Medicare PPO

## 2022-11-05 ENCOUNTER — Inpatient Hospital Stay (HOSPITAL_BASED_OUTPATIENT_CLINIC_OR_DEPARTMENT_OTHER): Payer: Medicare PPO | Admitting: Hematology and Oncology

## 2022-11-05 ENCOUNTER — Other Ambulatory Visit: Payer: Self-pay

## 2022-11-05 VITALS — BP 129/61 | HR 81 | Temp 98.4°F | Resp 18

## 2022-11-05 VITALS — BP 154/91 | HR 80 | Temp 97.7°F | Resp 16 | Ht 69.0 in | Wt 165.4 lb

## 2022-11-05 DIAGNOSIS — D509 Iron deficiency anemia, unspecified: Secondary | ICD-10-CM

## 2022-11-05 DIAGNOSIS — D649 Anemia, unspecified: Secondary | ICD-10-CM

## 2022-11-05 DIAGNOSIS — C61 Malignant neoplasm of prostate: Secondary | ICD-10-CM

## 2022-11-05 DIAGNOSIS — C772 Secondary and unspecified malignant neoplasm of intra-abdominal lymph nodes: Secondary | ICD-10-CM | POA: Diagnosis not present

## 2022-11-05 LAB — CBC WITH DIFFERENTIAL (CANCER CENTER ONLY)
Abs Immature Granulocytes: 0.04 10*3/uL (ref 0.00–0.07)
Basophils Absolute: 0.1 10*3/uL (ref 0.0–0.1)
Basophils Relative: 1 %
Eosinophils Absolute: 0.2 10*3/uL (ref 0.0–0.5)
Eosinophils Relative: 2 %
HCT: 25.3 % — ABNORMAL LOW (ref 39.0–52.0)
Hemoglobin: 8.2 g/dL — ABNORMAL LOW (ref 13.0–17.0)
Immature Granulocytes: 1 %
Lymphocytes Relative: 16 %
Lymphs Abs: 1.3 10*3/uL (ref 0.7–4.0)
MCH: 27.2 pg (ref 26.0–34.0)
MCHC: 32.4 g/dL (ref 30.0–36.0)
MCV: 84.1 fL (ref 80.0–100.0)
Monocytes Absolute: 0.5 10*3/uL (ref 0.1–1.0)
Monocytes Relative: 6 %
Neutro Abs: 5.9 10*3/uL (ref 1.7–7.7)
Neutrophils Relative %: 74 %
Platelet Count: 270 10*3/uL (ref 150–400)
RBC: 3.01 MIL/uL — ABNORMAL LOW (ref 4.22–5.81)
RDW: 13.5 % (ref 11.5–15.5)
WBC Count: 7.9 10*3/uL (ref 4.0–10.5)
nRBC: 0 % (ref 0.0–0.2)

## 2022-11-05 LAB — SAMPLE TO BLOOD BANK

## 2022-11-05 MED ORDER — CYANOCOBALAMIN 1000 MCG/ML IJ SOLN
1000.0000 ug | Freq: Once | INTRAMUSCULAR | Status: AC
  Administered 2022-11-05: 1000 ug via INTRAMUSCULAR
  Filled 2022-11-05: qty 1

## 2022-11-05 MED ORDER — SODIUM CHLORIDE 0.9 % IV SOLN
300.0000 mg | Freq: Once | INTRAVENOUS | Status: AC
  Administered 2022-11-05: 300 mg via INTRAVENOUS
  Filled 2022-11-05: qty 300

## 2022-11-05 MED ORDER — SODIUM CHLORIDE 0.9 % IV SOLN: Freq: Once | INTRAVENOUS | Status: AC

## 2022-11-05 NOTE — Progress Notes (Signed)
[place] [phone]   [phone]  PROGRESS NOTE  Patient Care Team: Iona Beard, MD as PCP - General (Family Medicine)  Hematological/Oncological History  # Metastatic Prostate Cancer w/ Spread to Intraabdominal Lymph Nodes --previously followed by Dr. Chryl Heck. Currently on abiraterone therapy 06/18/2021: PSA <0.01  Interval History:   Joseph Rangel 77 y.o. male with medical history significant for metastatic prostate cancer who presents for a follow up visit.   Mr. Kapple is here for follow-up.  He continues to feel tired.  He was playing solitaire at the time of her visit.  Besides some minor fatigue and dizziness, he also tells me that he ran out of [place] today.  He denies any bleeding per rectum however he does not really pay attention to his bowels. Rest of the pertinent 10 point ROS reviewed and negative.  MEDICAL HISTORY:  Past Medical History:  Diagnosis Date   Arthritis    Cancer [facility])    prostate    Frequency of urination    Hypertension    Seizures ([place])    after brain surg - none in past 20 yrs   Speech disorder    due to brain surg    SURGICAL HISTORY: Past Surgical History:  Procedure Laterality Date   BRAIN SURGERY     BLOOD CLOT REMOVED 1980   MASS EXCISION  02/24/2012   Procedure: MINOR EXCISION OF MASS;  Surgeon: Bernestine Amass, MD;  Location: [facility];  Service: Urology;;  excision of neck lesion   ROBOT ASSISTED LAPAROSCOPIC RADICAL PROSTATECTOMY  02/24/2012   Procedure: ROBOTIC ASSISTED LAPAROSCOPIC RADICAL PROSTATECTOMY;  Surgeon: Bernestine Amass, MD;  Location: [facility];  Service: Urology;  Laterality: N/A;           SOCIAL HISTORY: Social History   Socioeconomic History   Marital status: Married    Spouse name: Not on file   Number of children: Not on file   Years of education: Not on file   Highest education level: Not on file  Occupational History   Not on file  Tobacco Use   Smoking status: Every  Day    Types: Cigarettes   Smokeless tobacco: Never   Tobacco comments:    2 cigarettes per day  Vaping Use   Vaping Use: Never used  Substance and Sexual Activity   Alcohol use: No   Drug use: No   Sexual activity: Not on file  Other Topics Concern   Not on file  Social History Narrative   Not on file   Social Determinants of Health   Financial Resource Strain: Not on file  Food Insecurity: Not on file  Transportation Needs: Not on file  Physical Activity: Not on file  Stress: Not on file  Social Connections: Not on file  Intimate Partner Violence: Not on file    FAMILY HISTORY: No family history on file.  ALLERGIES:  has No Known Allergies.  MEDICATIONS:  Current Outpatient Medications  Medication Sig Dispense Refill   abiraterone acetate (ZYTIGA) 250 MG tablet TAKE 4 TABLETS (1,000 MG TOTAL) BY MOUTH DAILY. TAKE ON AN EMPTY STOMACH 1 HOUR BEFORE OR 2 HOURS AFTER A MEAL 120 tablet 2   atenolol (TENORMIN) 25 MG tablet Take 25 mg by mouth daily.     buprenorphine (BUTRANS) 7.5 MCG/HR  (Patient not taking: Reported on 08/20/2021)     cilostazol (PLETAL) 50 MG tablet Take 50 mg by mouth daily.     fenofibrate (TRICOR) 48  MG tablet Take 48 mg by mouth daily.     gabapentin (NEURONTIN) 100 MG capsule Take by mouth.     hydrochlorothiazide (HYDRODIURIL) 25 MG tablet Take 25 mg by mouth daily.     HYDROcodone-acetaminophen (NORCO) 10-325 MG tablet Take 1 tablet by mouth every 4 (four) hours as needed for moderate pain.     latanoprost (XALATAN) 0.005 % ophthalmic solution Place 1 drop into both eyes every other day. At night     meloxicam (MOBIC) 15 MG tablet Take 15 mg by mouth daily.     olmesartan (BENICAR) 40 MG tablet Take 40 mg by mouth daily.     omeprazole (PRILOSEC) 40 MG capsule Take 40 mg by mouth daily.     phenytoin (DILANTIN) 100 MG ER capsule Take 300 mg by mouth daily.     potassium chloride SA (KLOR-CON M) 20 MEQ tablet Take 2 tablets (40 mEq total) by mouth  daily. 60 tablet 1   predniSONE (DELTASONE) 5 MG tablet TAKE 1 TABLET BY MOUTH ONCE A DAY WITH BREAKFAST 90 tablet 3   verapamil (VERELAN PM) 360 MG 24 hr capsule Take 360 mg by mouth daily.     No current facility-administered medications for this visit.   Facility-Administered Medications Ordered in Other Visits  Medication Dose Route Frequency Provider Last Rate Last Admin   iron sucrose (VENOFER) 300 mg in sodium chloride 0.9 % 250 mL IVPB  300 mg Intravenous Once Rashika Bettes, MD        REVIEW OF SYSTEMS:   Constitutional: ( - ) fevers, ( - )  chills , ( - ) night sweats Eyes: ( - ) blurriness of vision, ( - ) double vision, ( - ) watery eyes Ears, nose, mouth, throat, and face: ( - ) mucositis, ( - ) sore throat Respiratory: ( - ) cough, ( - ) dyspnea, ( - ) wheezes Cardiovascular: ( - ) palpitation, ( - ) chest discomfort, ( - ) lower extremity swelling Gastrointestinal:  ( - ) nausea, ( - ) heartburn, ( - ) change in bowel habits Skin: ( - ) abnormal skin rashes Lymphatics: ( - ) new lymphadenopathy, ( - ) easy bruising Neurological: ( - ) numbness, ( - ) tingling, ( - ) new weaknesses Behavioral/Psych: ( - ) mood change, ( - ) new changes  All other systems were reviewed with the patient and are negative.  PHYSICAL EXAMINATION: ECOG PERFORMANCE STATUS: 2 - Symptomatic, <50% confined to bed  Vitals:   11/05/22 1350  BP: (!) 154/91  Pulse: 80  Resp: 16  Temp: 97.7 F (36.5 C)  SpO2: 99%     Filed Weights   11/05/22 1350  Weight: 165 lb 6.4 oz (75 kg)    Physical Exam Constitutional:      General: He is not in acute distress.    Appearance: Normal appearance.     Comments: Came in wheel chair  HENT:     Head: Normocephalic and atraumatic.  Cardiovascular:     Rate and Rhythm: Normal rate and regular rhythm.     Pulses: Normal pulses.     Heart sounds: Normal heart sounds.  Pulmonary:     Effort: Pulmonary effort is normal.     Breath sounds: Normal  breath sounds. No wheezing.  Musculoskeletal:        General: No swelling or tenderness.  Neurological:     General: No focal deficit present.     Mental Status: He is alert.  Psychiatric:        Mood and Affect: Mood normal.      LABORATORY DATA:  I have reviewed the data as listed    Latest Ref Rng & Units 11/05/2022    1:19 PM 10/29/2022    2:45 PM 10/01/2022    2:33 PM  CBC  WBC 4.0 - 10.5 K/uL 7.9  8.2  8.6   Hemoglobin 13.0 - 17.0 g/dL 8.2  8.3  8.0   Hematocrit 39.0 - 52.0 % 25.3  25.6  23.8   Platelets 150 - 400 K/uL 270  303  256        Latest Ref Rng & Units 10/29/2022    2:45 PM 10/01/2022    2:33 PM 08/05/2022    3:11 PM  CMP  Glucose 70 - 99 mg/dL 128  94  131   BUN 8 - 23 mg/dL 16  13  17   $ Creatinine 0.61 - 1.24 mg/dL 1.02  0.98  1.21   Sodium 135 - 145 mmol/L 137  140  136   Potassium 3.5 - 5.1 mmol/L 4.0  3.7  2.7   Chloride 98 - 111 mmol/L 107  108  100   CO2 22 - 32 mmol/L 22  23  28   $ Calcium 8.9 - 10.3 mg/dL 9.0  9.1  8.5   Total Protein 6.5 - 8.1 g/dL 7.0  7.1  7.3   Total Bilirubin 0.3 - 1.2 mg/dL 0.3  0.3  0.4   Alkaline Phos 38 - 126 U/L 140  107  122   AST 15 - 41 U/L 10  12  15   $ ALT 0 - 44 U/L 5  5  8    $ RADIOGRAPHIC STUDIES: No results found.  ASSESSMENT & PLAN Joseph Rangel 77 y.o. male with medical history significant for metastatic prostate cancer who presents for a follow up visit.  PMH significant for prostate adenocarcinoma Gleason 3+4, no LN involvement s.p robotic prostatectomy in 2013, presented with peri aortic left iliac and peri rectal LN, with biopsy confirming metastatic prostate adenoca now on zytiga and lupron presents here for FU on his metastatic prostate cancer. Baseline PSA of 207.   # Metastatic Castrate Sensitive Prostate Cancer -- continue on Zytiga 1080m PO daily with prednisone -- He will receive lupron today, due every 3 months.  -- PSA from today is pending, last PSA without any concerns for recurrence. ---He  should continue on Zytiga with prednisone and RTC in 8 weeks for FU -- He did not have any bone metastasis hence he is not on bisphosphonate therapy. --He tells me that he ran out of [place].  He is supposed to have 2 refills.  He obtains this from [employer]  # Normocytic normochromic anemia, Hb 8.  He denies any blood loss or melena, but he is not quite sure about the stool. Labs today ferritin of 10. We will consider IV iron replacement, he unfortunately is not very reliable with oral iron replacement. I today recommended referral to gastroenterology for further evaluation given his severe iron deficiency anemia.  I have placed a referral and I informed the patient.  # B12 deficiency, B12 inj ordered.   Orders Placed This Encounter  Procedures   Ambulatory referral to Gastroenterology    Referral Priority:   Routine    Referral Type:   Consultation    Referral Reason:   Specialty Services Required    Number of Visits Requested:   1  All questions were answered. The patient knows to call the clinic with any problems, questions or concerns.  A total of more than 20 minutes were spent on this encounter with face-to-face time and non-face-to-face time, including preparing to see the patient, ordering tests and/or medications, counseling the patient and coordination of care as outlined above.   Benay Pike MD   11/05/2022 2:57 PM

## 2022-11-05 NOTE — Patient Instructions (Signed)

## 2022-11-09 ENCOUNTER — Inpatient Hospital Stay: Payer: Medicare PPO | Admitting: Hematology and Oncology

## 2022-11-11 ENCOUNTER — Other Ambulatory Visit: Payer: Self-pay | Admitting: Hematology and Oncology

## 2022-11-12 ENCOUNTER — Other Ambulatory Visit: Payer: Self-pay

## 2022-11-12 ENCOUNTER — Inpatient Hospital Stay: Payer: Medicare PPO

## 2022-11-12 VITALS — BP 145/72 | HR 66 | Resp 18

## 2022-11-12 DIAGNOSIS — C61 Malignant neoplasm of prostate: Secondary | ICD-10-CM | POA: Diagnosis not present

## 2022-11-12 DIAGNOSIS — C772 Secondary and unspecified malignant neoplasm of intra-abdominal lymph nodes: Secondary | ICD-10-CM

## 2022-11-12 MED ORDER — SODIUM CHLORIDE 0.9 % IV SOLN
300.0000 mg | Freq: Once | INTRAVENOUS | Status: AC
  Administered 2022-11-12: 300 mg via INTRAVENOUS
  Filled 2022-11-12: qty 300

## 2022-11-12 MED ORDER — SODIUM CHLORIDE 0.9 % IV SOLN: Freq: Once | INTRAVENOUS | Status: AC

## 2022-11-12 NOTE — Progress Notes (Signed)
Pt reported to infusion today for IV Venofer. During the infusion the Pt expressed c/o not understanding why he was here, why he was receiving the iron, and why he has to come back for future appts. This RN attempted to educate the Pt. After education Pt still expressed he did not understand. This RN notified Dr. Chryl Heck and Tivis Ringer, RN.  Val RN to call Pt tomorrow, 11/13/2022, for follow up education regarding Pt's IV Venofer infusion appointments.   Pt agreeable to receive phone call and verbalized understanding.

## 2022-11-12 NOTE — Progress Notes (Signed)
Pt declined to observed for 30 minutes post Venofer infusion. Pt tolerated trtmt well w/out incident. VSS at discharge.  W/C assist to lobby.

## 2022-11-12 NOTE — Patient Instructions (Signed)

## 2022-11-18 ENCOUNTER — Telehealth: Payer: Self-pay | Admitting: *Deleted

## 2022-11-18 NOTE — Telephone Encounter (Signed)
This RN spoke with pt today per his questions regarding why he is getting iron infusions-  This RN informed him of low iron and need for IV replacement weekly x 3.  Pt agreed to coming to appointment tomorrow

## 2022-11-19 ENCOUNTER — Inpatient Hospital Stay: Payer: Medicare PPO

## 2022-11-19 ENCOUNTER — Other Ambulatory Visit: Payer: Self-pay

## 2022-11-19 VITALS — BP 139/68 | HR 78 | Temp 98.9°F | Resp 18

## 2022-11-19 DIAGNOSIS — C61 Malignant neoplasm of prostate: Secondary | ICD-10-CM | POA: Diagnosis not present

## 2022-11-19 MED ORDER — CYANOCOBALAMIN 1000 MCG/ML IJ SOLN
1000.0000 ug | Freq: Once | INTRAMUSCULAR | Status: AC
  Administered 2022-11-19: 1000 ug via INTRAMUSCULAR
  Filled 2022-11-19: qty 1

## 2022-11-19 MED ORDER — SODIUM CHLORIDE 0.9 % IV SOLN
300.0000 mg | Freq: Once | INTRAVENOUS | Status: AC
  Administered 2022-11-19: 300 mg via INTRAVENOUS
  Filled 2022-11-19: qty 300

## 2022-11-19 MED ORDER — SODIUM CHLORIDE 0.9 % IV SOLN: Freq: Once | INTRAVENOUS | Status: AC

## 2022-11-19 NOTE — Patient Instructions (Signed)
Iron Sucrose Injection What is this medication? IRON SUCROSE (EYE ern SOO krose) treats low levels of iron (iron deficiency anemia) in people with kidney disease. Iron is a mineral that plays an important role in making red blood cells, which carry oxygen from your lungs to the rest of your body. This medicine may be used for other purposes; ask your health care provider or pharmacist if you have questions. COMMON BRAND NAME(S): Venofer What should I tell my care team before I take this medication? They need to know if you have any of these conditions: Anemia not caused by low iron levels Heart disease High levels of iron in the blood Kidney disease Liver disease An unusual or allergic reaction to iron, other medications, foods, dyes, or preservatives Pregnant or trying to get pregnant Breastfeeding How should I use this medication? This medication is for infusion into a vein. It is given in a hospital or clinic setting. Talk to your care team about the use of this medication in children. While this medication may be prescribed for children as young as 2 years for selected conditions, precautions do apply. Overdosage: If you think you have taken too much of this medicine contact a poison control center or emergency room at once. NOTE: This medicine is only for you. Do not share this medicine with others. What if I miss a dose? Keep appointments for follow-up doses. It is important not to miss your dose. Call your care team if you are unable to keep an appointment. What may interact with this medication? Do not take this medication with any of the following: Deferoxamine Dimercaprol Other iron products This medication may also interact with the following: Chloramphenicol Deferasirox This list may not describe all possible interactions. Give your health care provider a list of all the medicines, herbs, non-prescription drugs, or dietary supplements you use. Also tell them if you smoke,  drink alcohol, or use illegal drugs. Some items may interact with your medicine. What should I watch for while using this medication? Visit your care team regularly. Tell your care team if your symptoms do not start to get better or if they get worse. You may need blood work done while you are taking this medication. You may need to follow a special diet. Talk to your care team. Foods that contain iron include: whole grains/cereals, dried fruits, beans, or peas, leafy green vegetables, and organ meats (liver, kidney). What side effects may I notice from receiving this medication? Side effects that you should report to your care team as soon as possible: Allergic reactions--skin rash, itching, hives, swelling of the face, lips, tongue, or throat Low blood pressure--dizziness, feeling faint or lightheaded, blurry vision Shortness of breath Side effects that usually do not require medical attention (report to your care team if they continue or are bothersome): Flushing Headache Joint pain Muscle pain Nausea Pain, redness, or irritation at injection site This list may not describe all possible side effects. Call your doctor for medical advice about side effects. You may report side effects to FDA at 1-800-FDA-1088. Where should I keep my medication? This medication is given in a hospital or clinic and will not be stored at home. NOTE: This sheet is a summary. It may not cover all possible information. If you have questions about this medicine, talk to your doctor, pharmacist, or health care provider.  2023 Elsevier/Gold Standard (2020-12-19 00:00:00)  Vitamin B12 Injection What is this medication? Vitamin B12 (VAHY tuh min B12) prevents and treats low   vitamin B12 levels in your body. It is used in people who do not get enough vitamin B12 from their diet or when their digestive tract does not absorb enough. Vitamin B12 plays an important role in maintaining the health of your nervous system and  red blood cells. This medicine may be used for other purposes; ask your health care provider or pharmacist if you have questions. COMMON BRAND NAME(S): B-12 Compliance Kit, B-12 Injection Kit, Cyomin, Dodex, LA-12, Nutri-Twelve, Physicians EZ Use B-12, Primabalt What should I tell my care team before I take this medication? They need to know if you have any of these conditions: Kidney disease Leber's disease Megaloblastic anemia An unusual or allergic reaction to cyanocobalamin, cobalt, other medications, foods, dyes, or preservatives Pregnant or trying to get pregnant Breast-feeding How should I use this medication? This medication is injected into a muscle or deeply under the skin. It is usually given in a clinic or care team's office. However, your care team may teach you how to inject yourself. Follow all instructions. Talk to your care team about the use of this medication in children. Special care may be needed. Overdosage: If you think you have taken too much of this medicine contact a poison control center or emergency room at once. NOTE: This medicine is only for you. Do not share this medicine with others. What if I miss a dose? If you are given your dose at a clinic or care team's office, call to reschedule your appointment. If you give your own injections, and you miss a dose, take it as soon as you can. If it is almost time for your next dose, take only that dose. Do not take double or extra doses. What may interact with this medication? Alcohol Colchicine This list may not describe all possible interactions. Give your health care provider a list of all the medicines, herbs, non-prescription drugs, or dietary supplements you use. Also tell them if you smoke, drink alcohol, or use illegal drugs. Some items may interact with your medicine. What should I watch for while using this medication? Visit your care team regularly. You may need blood work done while you are taking this  medication. You may need to follow a special diet. Talk to your care team. Limit your alcohol intake and avoid smoking to get the best benefit. What side effects may I notice from receiving this medication? Side effects that you should report to your care team as soon as possible: Allergic reactions--skin rash, itching, hives, swelling of the face, lips, tongue, or throat Swelling of the ankles, hands, or feet Trouble breathing Side effects that usually do not require medical attention (report to your care team if they continue or are bothersome): Diarrhea This list may not describe all possible side effects. Call your doctor for medical advice about side effects. You may report side effects to FDA at 1-800-FDA-1088. Where should I keep my medication? Keep out of the reach of children. Store at room temperature between 15 and 30 degrees C (59 and 85 degrees F). Protect from light. Throw away any unused medication after the expiration date. NOTE: This sheet is a summary. It may not cover all possible information. If you have questions about this medicine, talk to your doctor, pharmacist, or health care provider.  2023 Elsevier/Gold Standard (2021-05-20 00:00:00)

## 2022-11-19 NOTE — Progress Notes (Signed)
Pt. declined to stay for 30 minute post observation. Vital signs stable, left via wheelchair, no shortness of breath noted.

## 2022-11-26 ENCOUNTER — Other Ambulatory Visit: Payer: Self-pay

## 2022-11-26 ENCOUNTER — Other Ambulatory Visit: Payer: Self-pay | Admitting: *Deleted

## 2022-11-26 ENCOUNTER — Inpatient Hospital Stay: Payer: Medicare PPO | Attending: Hematology and Oncology

## 2022-11-26 VITALS — BP 120/74 | HR 89 | Temp 98.0°F | Resp 18

## 2022-11-26 DIAGNOSIS — Z79899 Other long term (current) drug therapy: Secondary | ICD-10-CM | POA: Diagnosis not present

## 2022-11-26 DIAGNOSIS — Z7902 Long term (current) use of antithrombotics/antiplatelets: Secondary | ICD-10-CM | POA: Insufficient documentation

## 2022-11-26 DIAGNOSIS — M129 Arthropathy, unspecified: Secondary | ICD-10-CM | POA: Diagnosis not present

## 2022-11-26 DIAGNOSIS — C61 Malignant neoplasm of prostate: Secondary | ICD-10-CM | POA: Diagnosis present

## 2022-11-26 DIAGNOSIS — E538 Deficiency of other specified B group vitamins: Secondary | ICD-10-CM | POA: Insufficient documentation

## 2022-11-26 DIAGNOSIS — Z791 Long term (current) use of non-steroidal anti-inflammatories (NSAID): Secondary | ICD-10-CM | POA: Diagnosis not present

## 2022-11-26 DIAGNOSIS — R42 Dizziness and giddiness: Secondary | ICD-10-CM | POA: Diagnosis not present

## 2022-11-26 DIAGNOSIS — G40909 Epilepsy, unspecified, not intractable, without status epilepticus: Secondary | ICD-10-CM | POA: Insufficient documentation

## 2022-11-26 DIAGNOSIS — I10 Essential (primary) hypertension: Secondary | ICD-10-CM | POA: Diagnosis not present

## 2022-11-26 DIAGNOSIS — D649 Anemia, unspecified: Secondary | ICD-10-CM | POA: Diagnosis not present

## 2022-11-26 DIAGNOSIS — Z7952 Long term (current) use of systemic steroids: Secondary | ICD-10-CM | POA: Diagnosis not present

## 2022-11-26 DIAGNOSIS — F17210 Nicotine dependence, cigarettes, uncomplicated: Secondary | ICD-10-CM | POA: Diagnosis not present

## 2022-11-26 MED ORDER — CYANOCOBALAMIN 1000 MCG/ML IJ SOLN
1000.0000 ug | Freq: Once | INTRAMUSCULAR | Status: AC
  Administered 2022-11-26: 1000 ug via INTRAMUSCULAR
  Filled 2022-11-26: qty 1

## 2022-11-26 NOTE — Patient Instructions (Signed)
Vitamin B12 Deficiency Vitamin B12 deficiency means that your body does not have enough vitamin B12. The body needs this important vitamin: To make red blood cells. To make genes (DNA). To help the nerves work. If you do not have enough vitamin B12 in your body, you can have health problems, such as not having enough red blood cells in the blood (anemia). What are the causes? Not eating enough foods that contain vitamin B12. Not being able to take in (absorb) vitamin B12 from the food that you eat. Certain diseases. A condition in which the body does not make enough of a certain protein. This results in your body not taking in enough vitamin B12. Having a surgery in which part of the stomach or small intestine is taken out. Taking medicines that make it hard for the body to take in vitamin B12. These include: Heartburn medicines. Some medicines that are used to treat diabetes. What increases the risk? Being an older adult. Eating a vegetarian or vegan diet that does not include any foods that come from animals. Not eating enough foods that contain vitamin B12 while you are pregnant. Taking certain medicines. Having alcoholism. What are the signs or symptoms? In some cases, there are no symptoms. If the condition leads to too few blood cells or nerve damage, symptoms can occur, such as: Feeling weak or tired. Not being hungry. Losing feeling (numbness) or tingling in your hands and feet. Redness and burning of the tongue. Feeling sad (depressed). Confusion or memory problems. Trouble walking. If anemia is very bad, symptoms can include: Being short of breath. Being dizzy. Having a very fast heartbeat. How is this treated? Changing the way you eat and drink, such as: Eating more foods that contain vitamin B12. Drinking little or no alcohol. Getting vitamin B12 shots. Taking vitamin B12 supplements by mouth (orally). Your doctor will tell you the dose that is best for you. Follow  these instructions at home: Eating and drinking  Eat foods that come from animals and have a lot of vitamin B12 in them. These include: Meats and poultry. This includes beef, pork, chicken, turkey, and organ meats, such as liver. Seafood, such as clams, rainbow trout, salmon, tuna, and haddock. Eggs. Dairy foods such as milk, yogurt, and cheese. Eat breakfast cereals that have vitamin B12 added to them (are fortified). Check the label. The items listed above may not be a complete list of foods and beverages you can eat and drink. Contact a dietitian for more information. Alcohol use Do not drink alcohol if: Your doctor tells you not to drink. You are pregnant, may be pregnant, or are planning to become pregnant. If you drink alcohol: Limit how much you have to: 0-1 drink a day for women. 0-2 drinks a day for men. Know how much alcohol is in your drink. In the [place] one drink equals one 12 oz bottle of beer (355 mL), one 5 oz glass of wine (148 mL), or one 1 oz glass of hard liquor (44 mL). General instructions Get any vitamin B12 shots if told by your doctor. Take supplements only as told by your doctor. Follow the directions. Keep all follow-up visits. Contact a doctor if: Your symptoms come back. Your symptoms get worse or do not get better with treatment. Get help right away if: You have trouble breathing. You have a very fast heartbeat. You have chest pain. You get dizzy. You faint. These symptoms may be an emergency. Get help right away. Call 911.   Do not wait to see if the symptoms will go away. Do not drive yourself to the hospital. Summary Vitamin B12 deficiency means that your body is not getting enough of the vitamin. In some cases, there are no symptoms of this condition. Treatment may include making a change in the way you eat and drink, getting shots, or taking supplements. Eat foods that have vitamin B12 in them. This information is not intended to replace advice  given to you by your health care provider. Make sure you discuss any questions you have with your health care provider. Document Revised: 05/02/2021 Document Reviewed: 05/02/2021 Elsevier Patient Education  2023 Elsevier Inc.  

## 2022-12-02 ENCOUNTER — Ambulatory Visit: Payer: Medicare PPO | Admitting: Hematology and Oncology

## 2022-12-03 ENCOUNTER — Inpatient Hospital Stay (HOSPITAL_BASED_OUTPATIENT_CLINIC_OR_DEPARTMENT_OTHER): Payer: Medicare PPO | Admitting: Hematology and Oncology

## 2022-12-03 ENCOUNTER — Inpatient Hospital Stay: Payer: Medicare PPO

## 2022-12-03 VITALS — BP 143/81 | HR 85 | Temp 97.9°F | Resp 16 | Ht 69.0 in | Wt 166.3 lb

## 2022-12-03 DIAGNOSIS — D649 Anemia, unspecified: Secondary | ICD-10-CM

## 2022-12-03 DIAGNOSIS — D509 Iron deficiency anemia, unspecified: Secondary | ICD-10-CM

## 2022-12-03 DIAGNOSIS — C61 Malignant neoplasm of prostate: Secondary | ICD-10-CM

## 2022-12-03 DIAGNOSIS — C772 Secondary and unspecified malignant neoplasm of intra-abdominal lymph nodes: Secondary | ICD-10-CM

## 2022-12-03 LAB — CBC WITH DIFFERENTIAL/PLATELET
Abs Immature Granulocytes: 0.04 10*3/uL (ref 0.00–0.07)
Basophils Absolute: 0.1 10*3/uL (ref 0.0–0.1)
Basophils Relative: 1 %
Eosinophils Absolute: 0.1 10*3/uL (ref 0.0–0.5)
Eosinophils Relative: 1 %
HCT: 30 % — ABNORMAL LOW (ref 39.0–52.0)
Hemoglobin: 9.7 g/dL — ABNORMAL LOW (ref 13.0–17.0)
Immature Granulocytes: 1 %
Lymphocytes Relative: 15 %
Lymphs Abs: 1.3 10*3/uL (ref 0.7–4.0)
MCH: 27.8 pg (ref 26.0–34.0)
MCHC: 32.3 g/dL (ref 30.0–36.0)
MCV: 86 fL (ref 80.0–100.0)
Monocytes Absolute: 0.4 10*3/uL (ref 0.1–1.0)
Monocytes Relative: 5 %
Neutro Abs: 6.9 10*3/uL (ref 1.7–7.7)
Neutrophils Relative %: 77 %
Platelets: 269 10*3/uL (ref 150–400)
RBC: 3.49 MIL/uL — ABNORMAL LOW (ref 4.22–5.81)
RDW: 16.9 % — ABNORMAL HIGH (ref 11.5–15.5)
WBC: 8.9 10*3/uL (ref 4.0–10.5)
nRBC: 0 % (ref 0.0–0.2)

## 2022-12-03 LAB — CMP (CANCER CENTER ONLY)
ALT: 5 U/L (ref 0–44)
AST: 9 U/L — ABNORMAL LOW (ref 15–41)
Albumin: 3.9 g/dL (ref 3.5–5.0)
Alkaline Phosphatase: 164 U/L — ABNORMAL HIGH (ref 38–126)
Anion gap: 10 (ref 5–15)
BUN: 13 mg/dL (ref 8–23)
CO2: 22 mmol/L (ref 22–32)
Calcium: 8.7 mg/dL — ABNORMAL LOW (ref 8.9–10.3)
Chloride: 106 mmol/L (ref 98–111)
Creatinine: 1.32 mg/dL — ABNORMAL HIGH (ref 0.61–1.24)
GFR, Estimated: 56 mL/min — ABNORMAL LOW (ref >60)
Glucose, Bld: 163 mg/dL — ABNORMAL HIGH (ref 70–99)
Potassium: 3.7 mmol/L (ref 3.5–5.1)
Sodium: 138 mmol/L (ref 135–145)
Total Bilirubin: 0.3 mg/dL (ref 0.3–1.2)
Total Protein: 7.4 g/dL (ref 6.5–8.1)

## 2022-12-03 LAB — IRON AND IRON BINDING CAPACITY (CC-WL,HP ONLY)
Iron: 78 ug/dL (ref 45–182)
Saturation Ratios: 38 % (ref 17.9–39.5)
TIBC: 203 ug/dL — ABNORMAL LOW (ref 250–450)
UIBC: 125 ug/dL (ref 117–376)

## 2022-12-03 LAB — VITAMIN B12: Vitamin B-12: 597 pg/mL (ref 180–914)

## 2022-12-03 MED ORDER — CYANOCOBALAMIN 1000 MCG/ML IJ SOLN
1000.0000 ug | Freq: Once | INTRAMUSCULAR | Status: AC
  Administered 2022-12-03: 1000 ug via INTRAMUSCULAR
  Filled 2022-12-03: qty 1

## 2022-12-03 NOTE — Progress Notes (Signed)
[place] [phone]   Fax:(336) [phone]  PROGRESS NOTE  Patient Care Team: Iona Beard, MD as PCP - General (Family Medicine)  Hematological/Oncological History  # Metastatic Prostate Cancer w/ Spread to Intraabdominal Lymph Nodes --previously followed by Dr. Chryl Heck. Currently on abiraterone therapy 06/18/2021: PSA <0.01  Interval History:   Joseph Rangel 77 y.o. male with medical history significant for metastatic prostate cancer who presents for a follow up visit.   Joseph Rangel is here for follow-up.  He is not a great historian. He has been getting some iron infusions, feeling better, no more dizziness. Appetite is still poor. He is taking all his medications. No change in breathing. No change in urinary habits. No new bone pains. Rest of the pertinent 10 point ROS reviewed and negative.  MEDICAL HISTORY:  Past Medical History:  Diagnosis Date   Arthritis    Cancer [facility])    prostate    Frequency of urination    Hypertension    Seizures ([place])    after brain surg - none in past 20 yrs   Speech disorder    due to brain surg    SURGICAL HISTORY: Past Surgical History:  Procedure Laterality Date   BRAIN SURGERY     BLOOD CLOT REMOVED 1980   MASS EXCISION  02/24/2012   Procedure: MINOR EXCISION OF MASS;  Surgeon: Bernestine Amass, MD;  Location: [facility];  Service: Urology;;  excision of neck lesion   ROBOT ASSISTED LAPAROSCOPIC RADICAL PROSTATECTOMY  02/24/2012   Procedure: ROBOTIC ASSISTED LAPAROSCOPIC RADICAL PROSTATECTOMY;  Surgeon: Bernestine Amass, MD;  Location: [facility];  Service: Urology;  Laterality: N/A;           SOCIAL HISTORY: Social History   Socioeconomic History   Marital status: Married    Spouse name: Not on file   Number of children: Not on file   Years of education: Not on file   Highest education level: Not on file  Occupational History   Not on file  Tobacco Use   Smoking status: Every Day    Types: Cigarettes    Smokeless tobacco: Never   Tobacco comments:    2 cigarettes per day  Vaping Use   Vaping Use: Never used  Substance and Sexual Activity   Alcohol use: No   Drug use: No   Sexual activity: Not on file  Other Topics Concern   Not on file  Social History Narrative   Not on file   Social Determinants of Health   Financial Resource Strain: Not on file  Food Insecurity: Not on file  Transportation Needs: Not on file  Physical Activity: Not on file  Stress: Not on file  Social Connections: Not on file  Intimate Partner Violence: Not on file    FAMILY HISTORY: No family history on file.  ALLERGIES:  has No Known Allergies.  MEDICATIONS:  Current Outpatient Medications  Medication Sig Dispense Refill   abiraterone acetate (ZYTIGA) 250 MG tablet TAKE 4 TABLETS (1,000 MG TOTAL) BY MOUTH DAILY. TAKE ON AN EMPTY STOMACH 1 HOUR BEFORE OR 2 HOURS AFTER A MEAL 120 tablet 2   atenolol (TENORMIN) 25 MG tablet Take 25 mg by mouth daily.     buprenorphine (BUTRANS) 7.5 MCG/HR  (Patient not taking: Reported on 08/20/2021)     cilostazol (PLETAL) 50 MG tablet Take 50 mg by mouth daily.     fenofibrate (TRICOR) 48 MG tablet Take 48 mg by mouth daily.  gabapentin (NEURONTIN) 100 MG capsule Take by mouth.     hydrochlorothiazide (HYDRODIURIL) 25 MG tablet Take 25 mg by mouth daily.     HYDROcodone-acetaminophen (NORCO) 10-325 MG tablet Take 1 tablet by mouth every 4 (four) hours as needed for moderate pain.     latanoprost (XALATAN) 0.005 % ophthalmic solution Place 1 drop into both eyes every other day. At night     meloxicam (MOBIC) 15 MG tablet Take 15 mg by mouth daily.     olmesartan (BENICAR) 40 MG tablet Take 40 mg by mouth daily.     omeprazole (PRILOSEC) 40 MG capsule Take 40 mg by mouth daily.     phenytoin (DILANTIN) 100 MG ER capsule Take 300 mg by mouth daily.     potassium chloride SA (KLOR-CON M) 20 MEQ tablet Take 2 tablets (40 mEq total) by mouth daily. 60 tablet 1    predniSONE (DELTASONE) 5 MG tablet TAKE 1 TABLET ONE TIME DAILY WITH BREAKFAST 90 tablet 3   verapamil (VERELAN PM) 360 MG 24 hr capsule Take 360 mg by mouth daily.     No current facility-administered medications for this visit.    REVIEW OF SYSTEMS:   Constitutional: ( - ) fevers, ( - )  chills , ( - ) night sweats Eyes: ( - ) blurriness of vision, ( - ) double vision, ( - ) watery eyes Ears, nose, mouth, throat, and face: ( - ) mucositis, ( - ) sore throat Respiratory: ( - ) cough, ( - ) dyspnea, ( - ) wheezes Cardiovascular: ( - ) palpitation, ( - ) chest discomfort, ( - ) lower extremity swelling Gastrointestinal:  ( - ) nausea, ( - ) heartburn, ( - ) change in bowel habits Skin: ( - ) abnormal skin rashes Lymphatics: ( - ) new lymphadenopathy, ( - ) easy bruising Neurological: ( - ) numbness, ( - ) tingling, ( - ) new weaknesses Behavioral/Psych: ( - ) mood change, ( - ) new changes  All other systems were reviewed with the patient and are negative.  PHYSICAL EXAMINATION: ECOG PERFORMANCE STATUS: 2 - Symptomatic, <50% confined to bed  Vitals:   12/03/22 1434  BP: (!) 143/81  Pulse: 85  Resp: 16  Temp: 97.9 F (36.6 C)  SpO2: 100%     Filed Weights   12/03/22 1434  Weight: 166 lb 4.8 oz (75.4 kg)    Physical Exam Constitutional:      General: He is not in acute distress.    Appearance: Normal appearance.     Comments: Came in wheel chair  HENT:     Head: Normocephalic and atraumatic.  Cardiovascular:     Rate and Rhythm: Normal rate and regular rhythm.     Pulses: Normal pulses.     Heart sounds: Normal heart sounds.  Pulmonary:     Effort: Pulmonary effort is normal.     Breath sounds: Normal breath sounds. No wheezing.  Musculoskeletal:        General: No swelling or tenderness.  Neurological:     General: No focal deficit present.     Mental Status: He is alert.  Psychiatric:        Mood and Affect: Mood normal.      LABORATORY DATA:  I have  reviewed the data as listed    Latest Ref Rng & Units 11/05/2022    1:19 PM 10/29/2022    2:45 PM 10/01/2022    2:33 PM  CBC  WBC  4.0 - 10.5 K/uL 7.9  8.2  8.6   Hemoglobin 13.0 - 17.0 g/dL 8.2  8.3  8.0   Hematocrit 39.0 - 52.0 % 25.3  25.6  23.8   Platelets 150 - 400 K/uL 270  303  256        Latest Ref Rng & Units 10/29/2022    2:45 PM 10/01/2022    2:33 PM 08/05/2022    3:11 PM  CMP  Glucose 70 - 99 mg/dL 128  94  131   BUN 8 - 23 mg/dL '16  13  17   '$ Creatinine 0.61 - 1.24 mg/dL 1.02  0.98  1.21   Sodium 135 - 145 mmol/L 137  140  136   Potassium 3.5 - 5.1 mmol/L 4.0  3.7  2.7   Chloride 98 - 111 mmol/L 107  108  100   CO2 22 - 32 mmol/L '22  23  28   '$ Calcium 8.9 - 10.3 mg/dL 9.0  9.1  8.5   Total Protein 6.5 - 8.1 g/dL 7.0  7.1  7.3   Total Bilirubin 0.3 - 1.2 mg/dL 0.3  0.3  0.4   Alkaline Phos 38 - 126 U/L 140  107  122   AST 15 - 41 U/L '10  12  15   '$ ALT 0 - 44 U/L '5  5  8    '$ RADIOGRAPHIC STUDIES: No results found.  ASSESSMENT & PLAN Joseph Rangel 77 y.o. male with medical history significant for metastatic prostate cancer who presents for a follow up visit.  PMH significant for prostate adenocarcinoma Gleason 3+4, no LN involvement s.p robotic prostatectomy in 2013, presented with peri aortic left iliac and peri rectal LN, with biopsy confirming metastatic prostate adenoca now on zytiga and lupron presents here for FU on his metastatic prostate cancer. Baseline PSA of 207.   # Metastatic Castrate Sensitive Prostate Cancer -- continue on Zytiga '1000mg'$  PO daily with prednisone -- He will receive lupron today, due every 3 months. Last dose on 10/29/2022. -- PSA from today is pending, last PSA without any concerns for recurrence. ---He should continue on Zytiga with prednisone and RTC in 8 weeks for FU -- He did not have any bone metastasis hence he is not on bisphosphonate therapy.  # Normocytic normochromic anemia,  Received iron infusion, ferritin 300 times 3. He is  feeling more energetic, less dizzy since this. Labs from today are pending  # B12 deficiency, B12 inj ordered. He needs weekly for 3 more doses including today.  Then he will get it once a month for a whole year.  I have also ordered repeat labs today to evaluate for ongoing B12 deficiency.   Orders Placed This Encounter  Procedures   CBC with Differential/Platelet    Standing Status:   Standing    Number of Occurrences:   22    Standing Expiration Date:   12/03/2023   CMP ([place] only)    Standing Status:   Future    Standing Expiration Date:   12/03/2023   PSA, total and free    Standing Status:   Future    Standing Expiration Date:   12/03/2023   Vitamin B12    Standing Status:   Future    Standing Expiration Date:   12/03/2023   Iron and Iron Binding Capacity ([facility] only)    Standing Status:   Future    Standing Expiration Date:   12/03/2023   Ferritin    Standing  Status:   Future    Standing Expiration Date:   12/03/2023    All questions were answered. The patient knows to call the clinic with any problems, questions or concerns.  A total of more than 20 minutes were spent on this encounter with face-to-face time and non-face-to-face time, including preparing to see the patient, ordering tests and/or medications, counseling the patient and coordination of care as outlined above.   Benay Pike MD   12/03/2022 2:37 PM

## 2022-12-04 LAB — PSA, TOTAL AND FREE
PSA, Free Pct: UNDETERMINED %
PSA, Free: <0.02 ng/mL
Prostate Specific Ag, Serum: <0.1 ng/mL (ref 0.0–4.0)

## 2022-12-04 LAB — FERRITIN: Ferritin: 186 ng/mL (ref 24–336)

## 2022-12-10 ENCOUNTER — Other Ambulatory Visit: Payer: Self-pay

## 2022-12-10 ENCOUNTER — Inpatient Hospital Stay: Payer: Medicare PPO

## 2022-12-10 ENCOUNTER — Encounter: Payer: Self-pay | Admitting: Hematology and Oncology

## 2022-12-10 DIAGNOSIS — C61 Malignant neoplasm of prostate: Secondary | ICD-10-CM | POA: Diagnosis not present

## 2022-12-10 MED ORDER — CYANOCOBALAMIN 1000 MCG/ML IJ SOLN
1000.0000 ug | Freq: Once | INTRAMUSCULAR | Status: AC
  Administered 2022-12-10: 1000 ug via INTRAMUSCULAR
  Filled 2022-12-10: qty 1

## 2022-12-17 ENCOUNTER — Inpatient Hospital Stay: Payer: Medicare PPO

## 2022-12-17 ENCOUNTER — Other Ambulatory Visit: Payer: Self-pay

## 2022-12-17 DIAGNOSIS — C61 Malignant neoplasm of prostate: Secondary | ICD-10-CM

## 2022-12-17 MED ORDER — CYANOCOBALAMIN 1000 MCG/ML IJ SOLN
1000.0000 ug | Freq: Once | INTRAMUSCULAR | Status: AC
  Administered 2022-12-17: 1000 ug via INTRAMUSCULAR
  Filled 2022-12-17: qty 1

## 2022-12-22 ENCOUNTER — Encounter: Payer: Self-pay | Admitting: Hematology and Oncology

## 2022-12-24 ENCOUNTER — Inpatient Hospital Stay: Payer: Medicare PPO | Attending: Hematology and Oncology | Admitting: Hematology and Oncology

## 2022-12-24 ENCOUNTER — Inpatient Hospital Stay: Payer: Medicare PPO

## 2022-12-24 VITALS — BP 147/100 | HR 78 | Temp 98.7°F

## 2022-12-24 DIAGNOSIS — I10 Essential (primary) hypertension: Secondary | ICD-10-CM | POA: Insufficient documentation

## 2022-12-24 DIAGNOSIS — D649 Anemia, unspecified: Secondary | ICD-10-CM | POA: Diagnosis not present

## 2022-12-24 DIAGNOSIS — Z7902 Long term (current) use of antithrombotics/antiplatelets: Secondary | ICD-10-CM | POA: Diagnosis not present

## 2022-12-24 DIAGNOSIS — D509 Iron deficiency anemia, unspecified: Secondary | ICD-10-CM

## 2022-12-24 DIAGNOSIS — F17210 Nicotine dependence, cigarettes, uncomplicated: Secondary | ICD-10-CM | POA: Insufficient documentation

## 2022-12-24 DIAGNOSIS — M129 Arthropathy, unspecified: Secondary | ICD-10-CM | POA: Insufficient documentation

## 2022-12-24 DIAGNOSIS — C61 Malignant neoplasm of prostate: Secondary | ICD-10-CM | POA: Diagnosis present

## 2022-12-24 DIAGNOSIS — E538 Deficiency of other specified B group vitamins: Secondary | ICD-10-CM | POA: Diagnosis not present

## 2022-12-24 DIAGNOSIS — Z79899 Other long term (current) drug therapy: Secondary | ICD-10-CM | POA: Diagnosis not present

## 2022-12-24 DIAGNOSIS — C772 Secondary and unspecified malignant neoplasm of intra-abdominal lymph nodes: Secondary | ICD-10-CM | POA: Insufficient documentation

## 2022-12-24 DIAGNOSIS — Z791 Long term (current) use of non-steroidal anti-inflammatories (NSAID): Secondary | ICD-10-CM | POA: Diagnosis not present

## 2022-12-24 DIAGNOSIS — G40909 Epilepsy, unspecified, not intractable, without status epilepticus: Secondary | ICD-10-CM | POA: Diagnosis not present

## 2022-12-24 MED ORDER — CYANOCOBALAMIN 1000 MCG/ML IJ SOLN
1000.0000 ug | Freq: Once | INTRAMUSCULAR | Status: AC
  Administered 2022-12-24: 1000 ug via INTRAMUSCULAR
  Filled 2022-12-24: qty 1

## 2022-12-24 NOTE — Progress Notes (Deleted)
[place] [phone]   Fax:([phone]  PROGRESS NOTE  Patient Care Team: Iona Beard, MD as PCP - General (Family Medicine)  Hematological/Oncological History  # Metastatic Prostate Cancer w/ Spread to Intraabdominal Lymph Nodes --previously followed by Dr. Chryl Heck. Currently on abiraterone therapy 06/18/2021: PSA <0.01  Interval History:   Joseph Rangel 77 y.o. male with medical history significant for metastatic prostate cancer who presents for a follow up visit.   Mr. [name] is here for follow-up. He is not a great historian. He has been getting some iron infusions, feeling better, no more dizziness. Appetite is still poor. He is taking all his medications. No change in breathing. No change in urinary habits. No new bone pains. Rest of the pertinent 10 point ROS reviewed and negative.  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    SURGICAL HISTORY: Past Surgical History:  Procedure Laterality Date   BRAIN SURGERY     BLOOD CLOT REMOVED 1980   MASS EXCISION  02/24/2012   Procedure: MINOR EXCISION OF MASS;  Surgeon: Bernestine Amass, MD;  Location: [facility];  Service: Urology;;  excision of neck lesion   ROBOT ASSISTED LAPAROSCOPIC RADICAL PROSTATECTOMY  02/24/2012   Procedure: ROBOTIC ASSISTED LAPAROSCOPIC RADICAL PROSTATECTOMY;  Surgeon: Bernestine Amass, MD;  Location: [facility];  Service: Urology;  Laterality: N/A;           SOCIAL HISTORY: Social History   Socioeconomic History   Marital status: Married    Spouse name: Not on file   Number of children: Not on file   Years of education: Not on file   Highest education level: Not on file  Occupational History   Not on file  Tobacco Use   Smoking status: Every Day    Types: Cigarettes    Smokeless tobacco: Never   Tobacco comments:    2 cigarettes per day  Vaping Use   Vaping Use: Never used  Substance and Sexual Activity   Alcohol use: No   Drug use: No   Sexual activity: Not on file  Other Topics Concern   Not on file  Social History Narrative   Not on file   Social Determinants of Health   Financial Resource Strain: Not on file  Food Insecurity: Not on file  Transportation Needs: Not on file  Physical Activity: Not on file  Stress: Not on file  Social Connections: Not on file  Intimate Partner Violence: Not on file    FAMILY HISTORY: No family history on file.  ALLERGIES:  has No Known Allergies.  MEDICATIONS:  Current Outpatient Medications  Medication Sig Dispense Refill   abiraterone acetate (ZYTIGA) 250 MG tablet TAKE 4 TABLETS (1,000 MG TOTAL) BY MOUTH DAILY. TAKE ON AN EMPTY STOMACH 1 HOUR BEFORE OR 2 HOURS AFTER A MEAL 120 tablet 2   atenolol (TENORMIN) 25 MG tablet Take 25 mg by mouth daily.     buprenorphine (BUTRANS) 7.5 MCG/HR  (Patient not taking: Reported on 08/20/2021)     cilostazol (PLETAL) 50 MG tablet Take 50 mg by mouth daily.     fenofibrate (TRICOR) 48 MG tablet Take 48 mg by mouth daily.  gabapentin (NEURONTIN) 100 MG capsule Take by mouth.     hydrochlorothiazide (HYDRODIURIL) 25 MG tablet Take 25 mg by mouth daily.     HYDROcodone-acetaminophen (NORCO) 10-325 MG tablet Take 1 tablet by mouth every 4 (four) hours as needed for moderate pain.     latanoprost (XALATAN) 0.005 % ophthalmic solution Place 1 drop into both eyes every other day. At night     meloxicam (MOBIC) 15 MG tablet Take 15 mg by mouth daily.     olmesartan (BENICAR) 40 MG tablet Take 40 mg by mouth daily.     omeprazole (PRILOSEC) 40 MG capsule Take 40 mg by mouth daily.     phenytoin (DILANTIN) 100 MG ER capsule Take 300 mg by mouth daily.     potassium chloride SA (KLOR-CON M) 20 MEQ tablet Take 2 tablets (40 mEq total) by mouth daily. 60 tablet 1    predniSONE (DELTASONE) 5 MG tablet TAKE 1 TABLET ONE TIME DAILY WITH BREAKFAST 90 tablet 3   verapamil (VERELAN PM) 360 MG 24 hr capsule Take 360 mg by mouth daily.     No current facility-administered medications for this visit.   Facility-Administered Medications Ordered in Other Visits  Medication Dose Route Frequency Provider Last Rate Last Admin   cyanocobalamin (VITAMIN B12) injection 1,000 mcg  1,000 mcg Intramuscular Once Joseph Gunnoe, MD        REVIEW OF SYSTEMS:   Constitutional: ( - ) fevers, ( - )  chills , ( - ) night sweats Eyes: ( - ) blurriness of vision, ( - ) double vision, ( - ) watery eyes Ears, nose, mouth, throat, and face: ( - ) mucositis, ( - ) sore throat Respiratory: ( - ) cough, ( - ) dyspnea, ( - ) wheezes Cardiovascular: ( - ) palpitation, ( - ) chest discomfort, ( - ) lower extremity swelling Gastrointestinal:  ( - ) nausea, ( - ) heartburn, ( - ) change in bowel habits Skin: ( - ) abnormal skin rashes Lymphatics: ( - ) new lymphadenopathy, ( - ) easy bruising Neurological: ( - ) numbness, ( - ) tingling, ( - ) new weaknesses Behavioral/Psych: ( - ) mood change, ( - ) new changes  All other systems were reviewed with the patient and are negative.  PHYSICAL EXAMINATION: ECOG PERFORMANCE STATUS: 2 - Symptomatic, <50% confined to bed  There were no vitals filed for this visit.    There were no vitals filed for this visit.   Physical Exam Constitutional:      General: He is not in acute distress.    Appearance: Normal appearance.     Comments: Came in wheel chair  HENT:     Head: Normocephalic and atraumatic.  Cardiovascular:     Rate and Rhythm: Normal rate and regular rhythm.     Pulses: Normal pulses.     Heart sounds: Normal heart sounds.  Pulmonary:     Effort: Pulmonary effort is normal.     Breath sounds: Normal breath sounds. No wheezing.  Musculoskeletal:        General: No swelling or tenderness.  Neurological:     General: No  focal deficit present.     Mental Status: He is alert.  Psychiatric:        Mood and Affect: Mood normal.      LABORATORY DATA:  I have reviewed the data as listed    Latest Ref Rng & Units 12/03/2022    2:55 PM 11/05/2022  1:19 PM 10/29/2022    2:45 PM  CBC  WBC 4.0 - 10.5 K/uL 8.9  7.9  8.2   Hemoglobin 13.0 - 17.0 g/dL 9.7  8.2  8.3   Hematocrit 39.0 - 52.0 % 30.0  25.3  25.6   Platelets 150 - 400 K/uL 269  270  303        Latest Ref Rng & Units 12/03/2022    3:25 PM 10/29/2022    2:45 PM 10/01/2022    2:33 PM  CMP  Glucose 70 - 99 mg/dL 163  128  94   BUN 8 - 23 mg/dL 13  16  13    Creatinine 0.61 - 1.24 mg/dL 1.32  1.02  0.98   Sodium 135 - 145 mmol/L 138  137  140   Potassium 3.5 - 5.1 mmol/L 3.7  4.0  3.7   Chloride 98 - 111 mmol/L 106  107  108   CO2 22 - 32 mmol/L 22  22  23    Calcium 8.9 - 10.3 mg/dL 8.7  9.0  9.1   Total Protein 6.5 - 8.1 g/dL 7.4  7.0  7.1   Total Bilirubin 0.3 - 1.2 mg/dL 0.3  0.3  0.3   Alkaline Phos 38 - 126 U/L 164  140  107   AST 15 - 41 U/L 9  10  12    ALT 0 - 44 U/L 5  5  5     RADIOGRAPHIC STUDIES: No results found.  ASSESSMENT & PLAN Taydem Lapp 77 y.o. male with medical history significant for metastatic prostate cancer who presents for a follow up visit.  PMH significant for prostate adenocarcinoma Gleason 3+4, no LN involvement s.p robotic prostatectomy in 2013, presented with peri aortic left iliac and peri rectal LN, with biopsy confirming metastatic prostate adenoca now on zytiga and lupron presents here for FU on his metastatic prostate cancer. Baseline PSA of 207.   # Metastatic Castrate Sensitive Prostate Cancer -- continue on Zytiga 1000mg  PO daily with prednisone -- He will receive lupron today, due every 3 months. Last dose on 10/29/2022. -- PSA from today is pending, last PSA without any concerns for recurrence. ---He should continue on Zytiga with prednisone and RTC in 8 weeks for FU -- He did not have any bone  metastasis hence he is not on bisphosphonate therapy.  # Normocytic normochromic anemia,  Received iron infusion, ferritin 300 times 3. He is feeling more energetic, less dizzy since this. Labs from today are pending  # B12 deficiency, B12 inj ordered. He needs weekly for 3 more doses including today.  Then he will get it once a month for a whole year.  I have also ordered repeat labs today to evaluate for ongoing B12 deficiency.   No orders of the defined types were placed in this encounter.   All questions were answered. The patient knows to call the clinic with any problems, questions or concerns.  A total of more than 20 minutes were spent on this encounter with face-to-face time and non-face-to-face time, including preparing to see the patient, ordering tests and/or medications, counseling the patient and coordination of care as outlined above.   Benay Pike MD   12/24/2022 4:44 PM

## 2022-12-27 ENCOUNTER — Encounter: Payer: Self-pay | Admitting: Hematology and Oncology

## 2022-12-27 NOTE — Progress Notes (Signed)
[facility] [phone]   [phone]  PROGRESS NOTE  Patient Care Team: [name], MD as PCP - General (Family Medicine)  Hematological/Oncological History  # Metastatic Prostate Cancer w/ Spread to Intraabdominal Lymph Nodes --previously followed by Dr. Al Pimple. Currently on abiraterone therapy 06/18/2021: PSA <0.01  Interval History:   Joseph Rangel 77 y.o. male with medical history significant for metastatic prostate cancer who presents for a follow up visit.   Joseph Rangel is here for follow-up.  He forgot his appointment and came a week early He denies any complaints today. His energy levels are definitely better he says. Rest of the pertinent 10 point ROS reviewed and negative.  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    SURGICAL HISTORY: Past Surgical History:  Procedure Laterality Date   BRAIN SURGERY     BLOOD CLOT REMOVED 1980   MASS EXCISION  02/24/2012   Procedure: MINOR EXCISION OF MASS;  Surgeon: Valetta Fuller, MD;  Location: [facility];  Service: Urology;;  excision of neck lesion   ROBOT ASSISTED LAPAROSCOPIC RADICAL PROSTATECTOMY  02/24/2012   Procedure: ROBOTIC ASSISTED LAPAROSCOPIC RADICAL PROSTATECTOMY;  Surgeon: Valetta Fuller, MD;  Location: [facility];  Service: Urology;  Laterality: N/A;           SOCIAL HISTORY: Social History   Socioeconomic History   Marital status: Married    Spouse name: Not on file   Number of children: Not on file   Years of education: Not on file   Highest education level: Not on file  Occupational History   Not on file  Tobacco Use   Smoking status: Every Day    Types: Cigarettes   Smokeless tobacco: Never   Tobacco comments:    2 cigarettes per day  Vaping Use   Vaping Use: Never  used  Substance and Sexual Activity   Alcohol use: No   Drug use: No   Sexual activity: Not on file  Other Topics Concern   Not on file  Social History Narrative   Not on file   Social Determinants of Health   Financial Resource Strain: Not on file  Food Insecurity: Not on file  Transportation Needs: Not on file  Physical Activity: Not on file  Stress: Not on file  Social Connections: Not on file  Intimate Partner Violence: Not on file    FAMILY HISTORY: No family history on file.  ALLERGIES:  has No Known Allergies.  MEDICATIONS:  Current Outpatient Medications  Medication Sig Dispense Refill   abiraterone acetate (ZYTIGA) 250 MG tablet TAKE 4 TABLETS (1,000 MG TOTAL) BY MOUTH DAILY. TAKE ON AN EMPTY STOMACH 1 HOUR BEFORE OR 2 HOURS AFTER A MEAL 120 tablet 2   atenolol (TENORMIN) 25 MG tablet Take 25 mg by mouth daily.     buprenorphine (BUTRANS) 7.5 MCG/HR  (Patient not taking: Reported on 08/20/2021)     cilostazol (PLETAL) 50 MG tablet Take 50 mg by mouth daily.     fenofibrate (TRICOR) 48 MG tablet Take 48 mg by mouth daily.     gabapentin (NEURONTIN) 100 MG capsule Take by mouth.     hydrochlorothiazide (HYDRODIURIL) 25 MG tablet  Take 25 mg by mouth daily.     HYDROcodone-acetaminophen (NORCO) 10-325 MG tablet Take 1 tablet by mouth every 4 (four) hours as needed for moderate pain.     latanoprost (XALATAN) 0.005 % ophthalmic solution Place 1 drop into both eyes every other day. At night     meloxicam (MOBIC) 15 MG tablet Take 15 mg by mouth daily.     olmesartan (BENICAR) 40 MG tablet Take 40 mg by mouth daily.     omeprazole (PRILOSEC) 40 MG capsule Take 40 mg by mouth daily.     phenytoin (DILANTIN) 100 MG ER capsule Take 300 mg by mouth daily.     potassium chloride SA (KLOR-CON M) 20 MEQ tablet Take 2 tablets (40 mEq total) by mouth daily. 60 tablet 1   predniSONE (DELTASONE) 5 MG tablet TAKE 1 TABLET ONE TIME DAILY WITH BREAKFAST 90 tablet 3   verapamil  (VERELAN PM) 360 MG 24 hr capsule Take 360 mg by mouth daily.     No current facility-administered medications for this visit.    REVIEW OF SYSTEMS:   Constitutional: ( - ) fevers, ( - )  chills , ( - ) night sweats Eyes: ( - ) blurriness of vision, ( - ) double vision, ( - ) watery eyes Ears, nose, mouth, throat, and face: ( - ) mucositis, ( - ) sore throat Respiratory: ( - ) cough, ( - ) dyspnea, ( - ) wheezes Cardiovascular: ( - ) palpitation, ( - ) chest discomfort, ( - ) lower extremity swelling Gastrointestinal:  ( - ) nausea, ( - ) heartburn, ( - ) change in bowel habits Skin: ( - ) abnormal skin rashes Lymphatics: ( - ) new lymphadenopathy, ( - ) easy bruising Neurological: ( - ) numbness, ( - ) tingling, ( - ) new weaknesses Behavioral/Psych: ( - ) mood change, ( - ) new changes  All other systems were reviewed with the patient and are negative.  PHYSICAL EXAMINATION: ECOG PERFORMANCE STATUS: 2 - Symptomatic, <50% confined to bed  There were no vitals filed for this visit.    There were no vitals filed for this visit.   Physical Exam Constitutional:      General: He is not in acute distress.    Appearance: Normal appearance.     Comments: Came in wheel chair  HENT:     Head: Normocephalic and atraumatic.  Cardiovascular:     Rate and Rhythm: Normal rate and regular rhythm.     Pulses: Normal pulses.     Heart sounds: Normal heart sounds.  Pulmonary:     Effort: Pulmonary effort is normal.     Breath sounds: Normal breath sounds. No wheezing.  Musculoskeletal:        General: No swelling or tenderness.  Neurological:     General: No focal deficit present.     Mental Status: He is alert.  Psychiatric:        Mood and Affect: Mood normal.      LABORATORY DATA:  I have reviewed the data as listed    Latest Ref Rng & Units 12/03/2022    2:55 PM 11/05/2022    1:19 PM 10/29/2022    2:45 PM  CBC  WBC 4.0 - 10.5 K/uL 8.9  7.9  8.2   Hemoglobin 13.0 - 17.0  g/dL 9.7  8.2  8.3   Hematocrit 39.0 - 52.0 % 30.0  25.3  25.6   Platelets 150 - 400 K/uL 269  270  303        Latest Ref Rng & Units 12/03/2022    3:25 PM 10/29/2022    2:45 PM 10/01/2022    2:33 PM  CMP  Glucose 70 - 99 mg/dL 562163  130128  94   BUN 8 - 23 mg/dL 13  16  13    Creatinine 0.61 - 1.24 mg/dL 8.651.32  7.841.02  6.960.98   Sodium 135 - 145 mmol/L 138  137  140   Potassium 3.5 - 5.1 mmol/L 3.7  4.0  3.7   Chloride 98 - 111 mmol/L 106  107  108   CO2 22 - 32 mmol/L 22  22  23    Calcium 8.9 - 10.3 mg/dL 8.7  9.0  9.1   Total Protein 6.5 - 8.1 g/dL 7.4  7.0  7.1   Total Bilirubin 0.3 - 1.2 mg/dL 0.3  0.3  0.3   Alkaline Phos 38 - 126 U/L 164  140  107   AST 15 - 41 U/L 9  10  12    ALT 0 - 44 U/L 5  5  5     RADIOGRAPHIC STUDIES: No results found.  ASSESSMENT & PLAN Jenkins RougeGattis Hammond 77 y.o. male with medical history significant for metastatic prostate cancer who presents for a follow up visit.  PMH significant for prostate adenocarcinoma Gleason 3+4, no LN involvement s.p robotic prostatectomy in 2013, presented with peri aortic left iliac and peri rectal LN, with biopsy confirming metastatic prostate adenoca now on zytiga and lupron presents here for FU on his metastatic prostate cancer. Baseline PSA of 207.   # Metastatic Castrate Sensitive Prostate Cancer -- continue on Zytiga 1000mg  PO daily with prednisone -- Lupron every 3 months, last dose 10/29/2022 -- PSA from today is pending, last PSA without any concerns for recurrence. ---He should continue on Zytiga with prednisone and RTC in 8 weeks for FU -- He did not have any bone metastasis hence he is not on bisphosphonate therapy.  # Normocytic normochromic anemia,  Received iron infusion, ferritin 300 times 3. He feels better overall.  # B12 deficiency, B12 inj ordered. B12 shot today and then monthly  No orders of the defined types were placed in this encounter.   All questions were answered. The patient knows to call the clinic  with any problems, questions or concerns.  A total of more than 20 minutes were spent on this encounter with face-to-face time and non-face-to-face time, including preparing to see the patient, ordering tests and/or medications, counseling the patient and coordination of care as outlined above.   Rachel MouldsPraveena Elowyn Raupp MD   12/27/2022 2:19 PM

## 2022-12-31 ENCOUNTER — Other Ambulatory Visit: Payer: Self-pay | Admitting: *Deleted

## 2022-12-31 ENCOUNTER — Inpatient Hospital Stay: Payer: Medicare PPO | Admitting: Hematology and Oncology

## 2022-12-31 MED ORDER — ABIRATERONE ACETATE 250 MG PO TABS: 1000.0000 mg | ORAL_TABLET | Freq: Every day | ORAL | 2 refills | Status: DC

## 2023-01-14 ENCOUNTER — Inpatient Hospital Stay: Payer: Medicare PPO

## 2023-01-14 DIAGNOSIS — C61 Malignant neoplasm of prostate: Secondary | ICD-10-CM

## 2023-01-14 MED ORDER — CYANOCOBALAMIN 1000 MCG/ML IJ SOLN
1000.0000 ug | Freq: Once | INTRAMUSCULAR | Status: AC
  Administered 2023-01-14: 1000 ug via INTRAMUSCULAR
  Filled 2023-01-14: qty 1

## 2023-01-14 NOTE — Patient Instructions (Signed)
Vitamin B12 Deficiency Vitamin B12 deficiency occurs when the body does not have enough of this important vitamin. The body needs this vitamin: To make red blood cells. To make DNA. This is the genetic material inside cells. To help the nerves work properly so they can carry messages from the brain to the body. Vitamin B12 deficiency can cause health problems, such as not having enough red blood cells in the blood (anemia). This can lead to nerve damage if untreated. What are the causes? This condition may be caused by: Not eating enough foods that contain vitamin B12. Not having enough stomach acid and digestive fluids to properly absorb vitamin B12 from the food that you eat. Having certain diseases that make it hard to absorb vitamin B12. These diseases include Crohn's disease, chronic pancreatitis, and cystic fibrosis. An autoimmune disorder in which the body does not make enough of a protein (intrinsic factor) within the stomach, resulting in not enough absorption of vitamin B12. Having a surgery in which part of the stomach or small intestine is removed. Taking certain medicines that make it hard for the body to absorb vitamin B12. These include: Heartburn medicines, such as antacids and proton pump inhibitors. Some medicines that are used to treat diabetes. What increases the risk? The following factors may make you more likely to develop a vitamin B12 deficiency: Being an older adult. Eating a vegetarian or vegan diet that does not include any foods that come from animals. Eating a poor diet while you are pregnant. Taking certain medicines. Having alcoholism. What are the signs or symptoms? In some cases, there are no symptoms of this condition. If the condition leads to anemia or nerve damage, various symptoms may occur, such as: Weakness. Tiredness (fatigue). Loss of appetite. Numbness or tingling in your hands and feet. Redness and burning of the tongue. Depression,  confusion, or memory problems. Trouble walking. If anemia is severe, symptoms can include: Shortness of breath. Dizziness. Rapid heart rate. How is this diagnosed? This condition may be diagnosed with a blood test to measure the level of vitamin B12 in your blood. You may also have other tests, including: A group of tests that measure certain characteristics of blood cells (complete blood count, CBC). A blood test to measure intrinsic factor. A procedure where a thin tube with a camera on the end is used to look into your stomach or intestines (endoscopy). Other tests may be needed to discover the cause of the deficiency. How is this treated? Treatment for this condition depends on the cause. This condition may be treated by: Changing your eating and drinking habits, such as: Eating more foods that contain vitamin B12. Drinking less alcohol or no alcohol. Getting vitamin B12 injections. Taking vitamin B12 supplements by mouth (orally). Your health care provider will tell you which dose is best for you. Follow these instructions at home: Eating and drinking  Include foods in your diet that come from animals and contain a lot of vitamin B12. These include: Meats and poultry. This includes beef, pork, chicken, turkey, and organ meats, such as liver. Seafood. This includes clams, rainbow trout, salmon, tuna, and haddock. Eggs. Dairy foods such as milk, yogurt, and cheese. Eat foods that have vitamin B12 added to them (are fortified), such as ready-to-eat breakfast cereals. Check the label on the package to see if a food is fortified. The items listed above may not be a complete list of foods and beverages you can eat and drink. Contact a dietitian for   more information. Alcohol use Do not drink alcohol if: Your health care provider tells you not to drink. You are pregnant, may be pregnant, or are planning to become pregnant. If you drink alcohol: Limit how much you have to: 0-1 drink a  day for women. 0-2 drinks a day for men. Know how much alcohol is in your drink. In the [place] one drink equals one 12 oz bottle of beer (355 mL), one 5 oz glass of wine (148 mL), or one 1 oz glass of hard liquor (44 mL). General instructions Get vitamin B12 injections if told to by your health care provider. Take supplements only as told by your health care provider. Follow the directions carefully. Keep all follow-up visits. This is important. Contact a health care provider if: Your symptoms come back. Your symptoms get worse or do not improve with treatment. Get help right away: You develop shortness of breath. You have a rapid heart rate. You have chest pain. You become dizzy or you faint. These symptoms may be an emergency. Get help right away. Call 911. Do not wait to see if the symptoms will go away. Do not drive yourself to the hospital. Summary Vitamin B12 deficiency occurs when the body does not have enough of this important vitamin. Common causes include not eating enough foods that contain vitamin B12, not being able to absorb vitamin B12 from the food that you eat, having a surgery in which part of the stomach or small intestine is removed, or taking certain medicines. Eat foods that have vitamin B12 in them. Treatment may include making a change in the way you eat and drink, getting vitamin B12 injections, or taking vitamin B12 supplements. This information is not intended to replace advice given to you by your health care provider. Make sure you discuss any questions you have with your health care provider. Document Revised: 05/02/2021 Document Reviewed: 05/02/2021 Elsevier Patient Education  2023 [employer]  

## 2023-01-26 ENCOUNTER — Telehealth: Payer: Self-pay | Admitting: Hematology and Oncology

## 2023-02-22 ENCOUNTER — Telehealth: Payer: Self-pay

## 2023-02-22 NOTE — Telephone Encounter (Signed)
Returned Pts call asking to come in for appt. Pt reports only availability for the next week is 02/25/23 at 1430 in new Pt slot. Appt made, will inform MD.

## 2023-02-25 ENCOUNTER — Inpatient Hospital Stay: Payer: Medicare PPO

## 2023-02-25 ENCOUNTER — Encounter: Payer: Self-pay | Admitting: Hematology and Oncology

## 2023-02-25 ENCOUNTER — Inpatient Hospital Stay: Payer: Medicare PPO | Attending: Hematology and Oncology | Admitting: Hematology and Oncology

## 2023-02-25 VITALS — BP 159/90 | HR 79 | Temp 97.7°F | Resp 17 | Wt 161.3 lb

## 2023-02-25 DIAGNOSIS — Z7902 Long term (current) use of antithrombotics/antiplatelets: Secondary | ICD-10-CM | POA: Diagnosis not present

## 2023-02-25 DIAGNOSIS — I10 Essential (primary) hypertension: Secondary | ICD-10-CM | POA: Insufficient documentation

## 2023-02-25 DIAGNOSIS — F17210 Nicotine dependence, cigarettes, uncomplicated: Secondary | ICD-10-CM | POA: Diagnosis not present

## 2023-02-25 DIAGNOSIS — Z7952 Long term (current) use of systemic steroids: Secondary | ICD-10-CM | POA: Diagnosis not present

## 2023-02-25 DIAGNOSIS — M25561 Pain in right knee: Secondary | ICD-10-CM | POA: Insufficient documentation

## 2023-02-25 DIAGNOSIS — C61 Malignant neoplasm of prostate: Secondary | ICD-10-CM | POA: Insufficient documentation

## 2023-02-25 DIAGNOSIS — Z79818 Long term (current) use of other agents affecting estrogen receptors and estrogen levels: Secondary | ICD-10-CM | POA: Diagnosis not present

## 2023-02-25 DIAGNOSIS — Z79899 Other long term (current) drug therapy: Secondary | ICD-10-CM | POA: Insufficient documentation

## 2023-02-25 DIAGNOSIS — E876 Hypokalemia: Secondary | ICD-10-CM | POA: Insufficient documentation

## 2023-02-25 DIAGNOSIS — E538 Deficiency of other specified B group vitamins: Secondary | ICD-10-CM | POA: Insufficient documentation

## 2023-02-25 DIAGNOSIS — Z791 Long term (current) use of non-steroidal anti-inflammatories (NSAID): Secondary | ICD-10-CM | POA: Diagnosis not present

## 2023-02-25 DIAGNOSIS — C772 Secondary and unspecified malignant neoplasm of intra-abdominal lymph nodes: Secondary | ICD-10-CM | POA: Diagnosis not present

## 2023-02-25 LAB — COMPREHENSIVE METABOLIC PANEL
ALT: 5 U/L (ref 0–44)
AST: 11 U/L — ABNORMAL LOW (ref 15–41)
Albumin: 4.2 g/dL (ref 3.5–5.0)
Alkaline Phosphatase: 166 U/L — ABNORMAL HIGH (ref 38–126)
Anion gap: 9 (ref 5–15)
BUN: 13 mg/dL (ref 8–23)
CO2: 29 mmol/L (ref 22–32)
Calcium: 9.1 mg/dL (ref 8.9–10.3)
Chloride: 104 mmol/L (ref 98–111)
Creatinine, Ser: 1.17 mg/dL (ref 0.61–1.24)
GFR, Estimated: >60 mL/min (ref >60)
Glucose, Bld: 120 mg/dL — ABNORMAL HIGH (ref 70–99)
Potassium: 2.9 mmol/L — ABNORMAL LOW (ref 3.5–5.1)
Sodium: 142 mmol/L (ref 135–145)
Total Bilirubin: 0.4 mg/dL (ref 0.3–1.2)
Total Protein: 7.6 g/dL (ref 6.5–8.1)

## 2023-02-25 LAB — CBC WITH DIFFERENTIAL/PLATELET
Abs Immature Granulocytes: 0.06 10*3/uL (ref 0.00–0.07)
Basophils Absolute: 0.1 10*3/uL (ref 0.0–0.1)
Basophils Relative: 1 %
Eosinophils Absolute: 0.2 10*3/uL (ref 0.0–0.5)
Eosinophils Relative: 1 %
HCT: 31.3 % — ABNORMAL LOW (ref 39.0–52.0)
Hemoglobin: 10.6 g/dL — ABNORMAL LOW (ref 13.0–17.0)
Immature Granulocytes: 1 %
Lymphocytes Relative: 17 %
Lymphs Abs: 2 10*3/uL (ref 0.7–4.0)
MCH: 29.9 pg (ref 26.0–34.0)
MCHC: 33.9 g/dL (ref 30.0–36.0)
MCV: 88.2 fL (ref 80.0–100.0)
Monocytes Absolute: 0.7 10*3/uL (ref 0.1–1.0)
Monocytes Relative: 6 %
Neutro Abs: 8.5 10*3/uL — ABNORMAL HIGH (ref 1.7–7.7)
Neutrophils Relative %: 74 %
Platelets: 265 10*3/uL (ref 150–400)
RBC: 3.55 MIL/uL — ABNORMAL LOW (ref 4.22–5.81)
RDW: 13.4 % (ref 11.5–15.5)
WBC: 11.4 10*3/uL — ABNORMAL HIGH (ref 4.0–10.5)
nRBC: 0 % (ref 0.0–0.2)

## 2023-02-25 MED ORDER — CYANOCOBALAMIN 1000 MCG/ML IJ SOLN
1000.0000 ug | Freq: Once | INTRAMUSCULAR | Status: AC
  Administered 2023-02-25: 1000 ug via INTRAMUSCULAR
  Filled 2023-02-25: qty 1

## 2023-02-25 NOTE — Patient Instructions (Signed)
Vitamin B12 Deficiency Vitamin B12 deficiency means that your body does not have enough vitamin B12. The body needs this important vitamin: To make red blood cells. To make genes (DNA). To help the nerves work. If you do not have enough vitamin B12 in your body, you can have health problems, such as not having enough red blood cells in the blood (anemia). What are the causes? Not eating enough foods that contain vitamin B12. Not being able to take in (absorb) vitamin B12 from the food that you eat. Certain diseases. A condition in which the body does not make enough of a certain protein. This results in your body not taking in enough vitamin B12. Having a surgery in which part of the stomach or small intestine is taken out. Taking medicines that make it hard for the body to take in vitamin B12. These include: Heartburn medicines. Some medicines that are used to treat diabetes. What increases the risk? Being an older adult. Eating a vegetarian or vegan diet that does not include any foods that come from animals. Not eating enough foods that contain vitamin B12 while you are pregnant. Taking certain medicines. Having alcoholism. What are the signs or symptoms? In some cases, there are no symptoms. If the condition leads to too few blood cells or nerve damage, symptoms can occur, such as: Feeling weak or tired. Not being hungry. Losing feeling (numbness) or tingling in your hands and feet. Redness and burning of the tongue. Feeling sad (depressed). Confusion or memory problems. Trouble walking. If anemia is very bad, symptoms can include: Being short of breath. Being dizzy. Having a very fast heartbeat. How is this treated? Changing the way you eat and drink, such as: Eating more foods that contain vitamin B12. Drinking little or no alcohol. Getting vitamin B12 shots. Taking vitamin B12 supplements by mouth (orally). Your doctor will tell you the dose that is best for you. Follow  these instructions at home: Eating and drinking  Eat foods that come from animals and have a lot of vitamin B12 in them. These include: Meats and poultry. This includes beef, pork, chicken, turkey, and organ meats, such as liver. Seafood, such as clams, rainbow trout, salmon, tuna, and haddock. Eggs. Dairy foods such as milk, yogurt, and cheese. Eat breakfast cereals that have vitamin B12 added to them (are fortified). Check the label. The items listed above may not be a complete list of foods and beverages you can eat and drink. Contact a dietitian for more information. Alcohol use Do not drink alcohol if: Your doctor tells you not to drink. You are pregnant, may be pregnant, or are planning to become pregnant. If you drink alcohol: Limit how much you have to: 0-1 drink a day for women. 0-2 drinks a day for men. Know how much alcohol is in your drink. In the [place] one drink equals one 12 oz bottle of beer (355 mL), one 5 oz glass of wine (148 mL), or one 1 oz glass of hard liquor (44 mL). General instructions Get any vitamin B12 shots if told by your doctor. Take supplements only as told by your doctor. Follow the directions. Keep all follow-up visits. Contact a doctor if: Your symptoms come back. Your symptoms get worse or do not get better with treatment. Get help right away if: You have trouble breathing. You have a very fast heartbeat. You have chest pain. You get dizzy. You faint. These symptoms may be an emergency. Get help right away. Call 911.   Do not wait to see if the symptoms will go away. Do not drive yourself to the hospital. Summary Vitamin B12 deficiency means that your body is not getting enough of the vitamin. In some cases, there are no symptoms of this condition. Treatment may include making a change in the way you eat and drink, getting shots, or taking supplements. Eat foods that have vitamin B12 in them. This information is not intended to replace advice  given to you by your health care provider. Make sure you discuss any questions you have with your health care provider. Document Revised: 05/02/2021 Document Reviewed: 05/02/2021 Elsevier Patient Education  2024 [employer]  

## 2023-02-25 NOTE — Progress Notes (Signed)
[facility] [phone]   [phone]  PROGRESS NOTE  Patient Care Team: [name], MD as PCP - General (Family Medicine)  Hematological/Oncological History  # Metastatic Prostate Cancer w/ Spread to Intraabdominal Lymph Nodes --previously followed by Dr. Al Pimple. Currently on abiraterone therapy 06/18/2021: PSA <0.01  Interval History:   Joseph Rangel 77 y.o. male with medical history significant for metastatic prostate cancer who presents for a follow up visit.   Joseph Rangel is here for follow-up. He is here for follow up. He has ongoing right knee pain, He can walk on it, but it keeps giving out. He is not really wanting to go to orthopedics. He says he does not want to do the Eligard and B12 injection today, he does not have a ride hence he wants to try and do this next week.  He is otherwise taking his Zytiga and prednisone as recommended.  No adverse effects reported with these medications.  No new bone pains except for the right knee which flares up from time to time, no change in urinary habits otherwise. Rest of the pertinent 10 point ROS reviewed and negative.  MEDICAL HISTORY:  Past Medical History:  Diagnosis Date   Arthritis    Cancer [facility])    prostate    Frequency of urination    Hypertension    Seizures (HCC)    after brain surg - none in past 20 yrs   Speech disorder    due to brain surg    SURGICAL HISTORY: Past Surgical History:  Procedure Laterality Date   BRAIN SURGERY     BLOOD CLOT REMOVED 1980   MASS EXCISION  02/24/2012   Procedure: MINOR EXCISION OF MASS;  Surgeon: Valetta Fuller, MD;  Location: [facility];  Service: Urology;;  excision of neck lesion   ROBOT ASSISTED LAPAROSCOPIC RADICAL PROSTATECTOMY  02/24/2012   Procedure: ROBOTIC ASSISTED LAPAROSCOPIC RADICAL PROSTATECTOMY;  Surgeon: Valetta Fuller, MD;  Location: [facility];  Service: Urology;  Laterality: N/A;           SOCIAL HISTORY: Social History    Socioeconomic History   Marital status: Married    Spouse name: Not on file   Number of children: Not on file   Years of education: Not on file   Highest education level: Not on file  Occupational History   Not on file  Tobacco Use   Smoking status: Every Day    Types: Cigarettes   Smokeless tobacco: Never   Tobacco comments:    2 cigarettes per day  Vaping Use   Vaping Use: Never used  Substance and Sexual Activity   Alcohol use: No   Drug use: No   Sexual activity: Not on file  Other Topics Concern   Not on file  Social History Narrative   Not on file   Social Determinants of Health   Financial Resource Strain: Not on file  Food Insecurity: Not on file  Transportation Needs: Not on file  Physical Activity: Not on file  Stress: Not on file  Social Connections: Not on file  Intimate Partner Violence: Not on file    FAMILY HISTORY: History reviewed. No pertinent family history.  ALLERGIES:  has No Known Allergies.  MEDICATIONS:  Current Outpatient Medications  Medication Sig Dispense Refill   abiraterone acetate (ZYTIGA) 250 MG tablet TAKE 4 TABLETS (1,000 MG TOTAL) BY MOUTH DAILY. TAKE ON AN EMPTY STOMACH 1 HOUR BEFORE OR 2 HOURS AFTER A MEAL 120  tablet 2   atenolol (TENORMIN) 25 MG tablet Take 25 mg by mouth daily.     buprenorphine (BUTRANS) 7.5 MCG/HR  (Patient not taking: Reported on 08/20/2021)     cilostazol (PLETAL) 50 MG tablet Take 50 mg by mouth daily.     fenofibrate (TRICOR) 48 MG tablet Take 48 mg by mouth daily.     gabapentin (NEURONTIN) 100 MG capsule Take by mouth.     hydrochlorothiazide (HYDRODIURIL) 25 MG tablet Take 25 mg by mouth daily.     HYDROcodone-acetaminophen (NORCO) 10-325 MG tablet Take 1 tablet by mouth every 4 (four) hours as needed for moderate pain.     latanoprost (XALATAN) 0.005 % ophthalmic solution Place 1 drop into both eyes every other day. At night     meloxicam (MOBIC) 15 MG tablet Take 15 mg by mouth daily.      olmesartan (BENICAR) 40 MG tablet Take 40 mg by mouth daily.     omeprazole (PRILOSEC) 40 MG capsule Take 40 mg by mouth daily.     phenytoin (DILANTIN) 100 MG ER capsule Take 300 mg by mouth daily.     potassium chloride SA (KLOR-CON M) 20 MEQ tablet Take 2 tablets (40 mEq total) by mouth daily. 60 tablet 1   predniSONE (DELTASONE) 5 MG tablet TAKE 1 TABLET ONE TIME DAILY WITH BREAKFAST 90 tablet 3   verapamil (VERELAN PM) 360 MG 24 hr capsule Take 360 mg by mouth daily.     No current facility-administered medications for this visit.    REVIEW OF SYSTEMS:   Constitutional: ( - ) fevers, ( - )  chills , ( - ) night sweats Eyes: ( - ) blurriness of vision, ( - ) double vision, ( - ) watery eyes Ears, nose, mouth, throat, and face: ( - ) mucositis, ( - ) sore throat Respiratory: ( - ) cough, ( - ) dyspnea, ( - ) wheezes Cardiovascular: ( - ) palpitation, ( - ) chest discomfort, ( - ) lower extremity swelling Gastrointestinal:  ( - ) nausea, ( - ) heartburn, ( - ) change in bowel habits Skin: ( - ) abnormal skin rashes Lymphatics: ( - ) new lymphadenopathy, ( - ) easy bruising Neurological: ( - ) numbness, ( - ) tingling, ( - ) new weaknesses Behavioral/Psych: ( - ) mood change, ( - ) new changes  All other systems were reviewed with the patient and are negative.  PHYSICAL EXAMINATION: ECOG PERFORMANCE STATUS: 2 - Symptomatic, <50% confined to bed  Vitals:   02/25/23 1501  BP: (!) 159/90  Pulse: 79  Resp: 17  Temp: 97.7 F (36.5 C)  SpO2: 100%      Filed Weights   02/25/23 1501  Weight: 161 lb 4.8 oz (73.2 kg)     Physical Exam Constitutional:      General: He is not in acute distress.    Appearance: Normal appearance.     Comments: Came with a walker   HENT:     Head: Normocephalic and atraumatic.  Cardiovascular:     Rate and Rhythm: Normal rate and regular rhythm.     Pulses: Normal pulses.     Heart sounds: Normal heart sounds.  Pulmonary:     Effort:  Pulmonary effort is normal.     Breath sounds: Normal breath sounds. No wheezing.  Musculoskeletal:        General: No swelling or tenderness.  Neurological:     General: No focal deficit present.  Mental Status: He is alert.  Psychiatric:        Mood and Affect: Mood normal.      LABORATORY DATA:  I have reviewed the data as listed    Latest Ref Rng & Units 02/25/2023    3:33 PM 12/03/2022    2:55 PM 11/05/2022    1:19 PM  CBC  WBC 4.0 - 10.5 K/uL 11.4  8.9  7.9   Hemoglobin 13.0 - 17.0 g/dL 60.4  9.7  8.2   Hematocrit 39.0 - 52.0 % 31.3  30.0  25.3   Platelets 150 - 400 K/uL 265  269  270        Latest Ref Rng & Units 02/25/2023    3:33 PM 12/03/2022    3:25 PM 10/29/2022    2:45 PM  CMP  Glucose 70 - 99 mg/dL 540  981  191   BUN 8 - 23 mg/dL 13  13  16    Creatinine 0.61 - 1.24 mg/dL 4.78  2.95  6.21   Sodium 135 - 145 mmol/L 142  138  137   Potassium 3.5 - 5.1 mmol/L 2.9  3.7  4.0   Chloride 98 - 111 mmol/L 104  106  107   CO2 22 - 32 mmol/L 29  22  22    Calcium 8.9 - 10.3 mg/dL 9.1  8.7  9.0   Total Protein 6.5 - 8.1 g/dL 7.6  7.4  7.0   Total Bilirubin 0.3 - 1.2 mg/dL 0.4  0.3  0.3   Alkaline Phos 38 - 126 U/L 166  164  140   AST 15 - 41 U/L 11  9  10    ALT 0 - 44 U/L 5  5  5     RADIOGRAPHIC STUDIES: No results found.  ASSESSMENT & PLAN Joseph Rangel 77 y.o. male with medical history significant for metastatic prostate cancer who presents for a follow up visit.  PMH significant for prostate adenocarcinoma Gleason 3+4, no LN involvement s.p robotic prostatectomy in 2013, presented with peri aortic left iliac and peri rectal LN, with biopsy confirming metastatic prostate adenoca now on zytiga and lupron presents here for FU on his metastatic prostate cancer. Baseline PSA of 207.  # Metastatic Castrate Sensitive Prostate Cancer -- continue on Zytiga 1000mg  PO daily with prednisone -- Lupron every 3 months, last dose 10/29/2022, he is overdue for this, did not want  to do the injection today will schedule this for next week. -- PSA from today is pending, last PSA without any concerns for recurrence. ---He should continue on Zytiga with prednisone and RTC in 8 weeks for FU -- He did not have any bone metastasis hence he is not on bisphosphonate therapy.    # Normocytic normochromic anemia,  Received iron infusion, ferritin 300 times 3. He feels better overall.  # B12 deficiency, B12 inj ordered. B12 shot today and then monthly  # Hypokalemia potassium of 2.9 encouraged him to continue potassium 20 mill equivalents daily and try potassium rich foods.  Orders Placed This Encounter  Procedures   CBC with Differential/Platelet    Standing Status:   Standing    Number of Occurrences:   22    Standing Expiration Date:   02/25/2024   Comprehensive metabolic panel    Standing Status:   Standing    Number of Occurrences:   33    Standing Expiration Date:   02/25/2024   PSA, total and free    Standing Status:  Future    Number of Occurrences:   1    Standing Expiration Date:   02/25/2024    All questions were answered. The patient knows to call the clinic with any problems, questions or concerns.  A total of more than 20 minutes were spent on this encounter with face-to-face time and non-face-to-face time, including preparing to see the patient, ordering tests and/or medications, counseling the patient and coordination of care as outlined above.   Rachel Moulds MD   02/25/2023 4:47 PM

## 2023-02-26 ENCOUNTER — Telehealth: Payer: Self-pay | Admitting: *Deleted

## 2023-02-26 LAB — PSA, TOTAL AND FREE
PSA, Free Pct: UNDETERMINED %
PSA, Free: <0.02 ng/mL
Prostate Specific Ag, Serum: <0.1 ng/mL (ref 0.0–4.0)

## 2023-02-26 MED ORDER — POTASSIUM CHLORIDE CRYS ER 20 MEQ PO TBCR: 40.0000 meq | EXTENDED_RELEASE_TABLET | Freq: Every day | ORAL | 6 refills | Status: DC

## 2023-02-26 NOTE — Telephone Encounter (Signed)
This RN called pt per noted decrease in his potassium level - and inquired about his klor con- including does he need a refill.  Joseph Rangel states he does need a refill.  Pharmacy verified and prescription refill sent x 6.  This RN reiterated to pt need to stay on this medication and what could happen if he does not.  Joseph Rangel stated he will take it.

## 2023-03-04 ENCOUNTER — Other Ambulatory Visit: Payer: Self-pay

## 2023-03-04 ENCOUNTER — Inpatient Hospital Stay: Payer: Medicare PPO

## 2023-03-04 VITALS — BP 150/94 | HR 78 | Temp 99.0°F | Resp 16

## 2023-03-04 DIAGNOSIS — C61 Malignant neoplasm of prostate: Secondary | ICD-10-CM | POA: Diagnosis not present

## 2023-03-04 MED ORDER — CYANOCOBALAMIN 1000 MCG/ML IJ SOLN
1000.0000 ug | Freq: Once | INTRAMUSCULAR | Status: AC
  Administered 2023-03-04: 1000 ug via INTRAMUSCULAR
  Filled 2023-03-04: qty 1

## 2023-03-04 MED ORDER — LEUPROLIDE ACETATE (3 MONTH) 22.5 MG ~~LOC~~ KIT
22.5000 mg | PACK | Freq: Once | SUBCUTANEOUS | Status: AC
  Administered 2023-03-04: 22.5 mg via SUBCUTANEOUS
  Filled 2023-03-04: qty 22.5

## 2023-03-05 ENCOUNTER — Other Ambulatory Visit: Payer: Self-pay

## 2023-03-05 MED ORDER — ABIRATERONE ACETATE 250 MG PO TABS: 1000.0000 mg | ORAL_TABLET | Freq: Every day | ORAL | 2 refills | Status: DC

## 2023-03-05 NOTE — Telephone Encounter (Signed)
Pt called stating he needs refill on Zytiga. Rx refilled per most recent MD note. Pt verbalized understanding.

## 2023-05-29 ENCOUNTER — Other Ambulatory Visit: Payer: Self-pay | Admitting: Hematology and Oncology

## 2023-05-29 DIAGNOSIS — C61 Malignant neoplasm of prostate: Secondary | ICD-10-CM

## 2023-05-29 DIAGNOSIS — C772 Secondary and unspecified malignant neoplasm of intra-abdominal lymph nodes: Secondary | ICD-10-CM

## 2023-05-31 ENCOUNTER — Encounter: Payer: Self-pay | Admitting: Hematology and Oncology

## 2023-06-08 ENCOUNTER — Inpatient Hospital Stay: Payer: Medicare PPO

## 2023-06-08 ENCOUNTER — Inpatient Hospital Stay: Payer: Medicare PPO | Admitting: Hematology and Oncology

## 2023-06-16 ENCOUNTER — Inpatient Hospital Stay (HOSPITAL_COMMUNITY)
Admission: EM | Admit: 2023-06-16 | Discharge: 2023-06-22 | DRG: 841 | Disposition: A | Discharge status: Skilled Nursing Facility | Payer: Medicare PPO | Attending: Internal Medicine | Admitting: Internal Medicine

## 2023-06-16 ENCOUNTER — Other Ambulatory Visit: Payer: Self-pay

## 2023-06-16 ENCOUNTER — Emergency Department (HOSPITAL_COMMUNITY): Payer: Medicare PPO

## 2023-06-16 ENCOUNTER — Encounter (HOSPITAL_COMMUNITY): Payer: Self-pay

## 2023-06-16 DIAGNOSIS — C61 Malignant neoplasm of prostate: Secondary | ICD-10-CM | POA: Diagnosis present

## 2023-06-16 DIAGNOSIS — W19XXXA Unspecified fall, initial encounter: Secondary | ICD-10-CM | POA: Diagnosis present

## 2023-06-16 DIAGNOSIS — E876 Hypokalemia: Principal | ICD-10-CM | POA: Diagnosis present

## 2023-06-16 DIAGNOSIS — R5381 Other malaise: Secondary | ICD-10-CM | POA: Diagnosis present

## 2023-06-16 DIAGNOSIS — I10 Essential (primary) hypertension: Secondary | ICD-10-CM | POA: Diagnosis present

## 2023-06-16 DIAGNOSIS — R3589 Other polyuria: Secondary | ICD-10-CM | POA: Diagnosis present

## 2023-06-16 DIAGNOSIS — Y92009 Unspecified place in unspecified non-institutional (private) residence as the place of occurrence of the external cause: Secondary | ICD-10-CM

## 2023-06-16 DIAGNOSIS — M7989 Other specified soft tissue disorders: Secondary | ICD-10-CM | POA: Diagnosis present

## 2023-06-16 DIAGNOSIS — K219 Gastro-esophageal reflux disease without esophagitis: Secondary | ICD-10-CM | POA: Diagnosis present

## 2023-06-16 DIAGNOSIS — R531 Weakness: Secondary | ICD-10-CM

## 2023-06-16 DIAGNOSIS — R2689 Other abnormalities of gait and mobility: Secondary | ICD-10-CM | POA: Diagnosis present

## 2023-06-16 DIAGNOSIS — G40909 Epilepsy, unspecified, not intractable, without status epilepticus: Secondary | ICD-10-CM | POA: Diagnosis present

## 2023-06-16 DIAGNOSIS — C772 Secondary and unspecified malignant neoplasm of intra-abdominal lymph nodes: Secondary | ICD-10-CM | POA: Diagnosis not present

## 2023-06-16 DIAGNOSIS — R9431 Abnormal electrocardiogram [ECG] [EKG]: Secondary | ICD-10-CM | POA: Diagnosis not present

## 2023-06-16 DIAGNOSIS — Z7902 Long term (current) use of antithrombotics/antiplatelets: Secondary | ICD-10-CM

## 2023-06-16 DIAGNOSIS — E538 Deficiency of other specified B group vitamins: Secondary | ICD-10-CM | POA: Diagnosis present

## 2023-06-16 DIAGNOSIS — E871 Hypo-osmolality and hyponatremia: Secondary | ICD-10-CM | POA: Diagnosis present

## 2023-06-16 DIAGNOSIS — R296 Repeated falls: Secondary | ICD-10-CM | POA: Diagnosis present

## 2023-06-16 DIAGNOSIS — Z791 Long term (current) use of non-steroidal anti-inflammatories (NSAID): Secondary | ICD-10-CM

## 2023-06-16 DIAGNOSIS — E669 Obesity, unspecified: Secondary | ICD-10-CM | POA: Diagnosis present

## 2023-06-16 DIAGNOSIS — F17210 Nicotine dependence, cigarettes, uncomplicated: Secondary | ICD-10-CM | POA: Diagnosis present

## 2023-06-16 DIAGNOSIS — D630 Anemia in neoplastic disease: Secondary | ICD-10-CM | POA: Diagnosis present

## 2023-06-16 DIAGNOSIS — D649 Anemia, unspecified: Secondary | ICD-10-CM | POA: Diagnosis present

## 2023-06-16 DIAGNOSIS — E785 Hyperlipidemia, unspecified: Secondary | ICD-10-CM | POA: Diagnosis present

## 2023-06-16 DIAGNOSIS — Z79899 Other long term (current) drug therapy: Secondary | ICD-10-CM

## 2023-06-16 DIAGNOSIS — L03114 Cellulitis of left upper limb: Secondary | ICD-10-CM | POA: Diagnosis present

## 2023-06-16 LAB — CBC
HCT: 30.7 % — ABNORMAL LOW (ref 39.0–52.0)
Hemoglobin: 10 g/dL — ABNORMAL LOW (ref 13.0–17.0)
MCH: 29.3 pg (ref 26.0–34.0)
MCHC: 32.6 g/dL (ref 30.0–36.0)
MCV: 90 fL (ref 80.0–100.0)
Platelets: 141 10*3/uL — ABNORMAL LOW (ref 150–400)
RBC: 3.41 MIL/uL — ABNORMAL LOW (ref 4.22–5.81)
RDW: 13 % (ref 11.5–15.5)
WBC: 10.3 10*3/uL (ref 4.0–10.5)
nRBC: 0 % (ref 0.0–0.2)

## 2023-06-16 LAB — TROPONIN I (HIGH SENSITIVITY)
Troponin I (High Sensitivity): 20 ng/L — ABNORMAL HIGH (ref <18)
Troponin I (High Sensitivity): 24 ng/L — ABNORMAL HIGH (ref <18)

## 2023-06-16 LAB — BASIC METABOLIC PANEL
Anion gap: 14 (ref 5–15)
BUN: 14 mg/dL (ref 8–23)
CO2: 25 mmol/L (ref 22–32)
Calcium: 8.5 mg/dL — ABNORMAL LOW (ref 8.9–10.3)
Chloride: 94 mmol/L — ABNORMAL LOW (ref 98–111)
Creatinine, Ser: 0.88 mg/dL (ref 0.61–1.24)
GFR, Estimated: >60 mL/min (ref >60)
Glucose, Bld: 104 mg/dL — ABNORMAL HIGH (ref 70–99)
Potassium: 2.2 mmol/L — CL (ref 3.5–5.1)
Sodium: 133 mmol/L — ABNORMAL LOW (ref 135–145)

## 2023-06-16 LAB — URINALYSIS, ROUTINE W REFLEX MICROSCOPIC
Bacteria, UA: NONE SEEN
Bilirubin Urine: NEGATIVE
Glucose, UA: NEGATIVE mg/dL
Ketones, ur: 5 mg/dL — AB
Leukocytes,Ua: NEGATIVE
Nitrite: NEGATIVE
Protein, ur: 30 mg/dL — AB
Specific Gravity, Urine: 1.016 (ref 1.005–1.030)
pH: 6 (ref 5.0–8.0)

## 2023-06-16 LAB — MAGNESIUM: Magnesium: 2 mg/dL (ref 1.7–2.4)

## 2023-06-16 MED ORDER — MECLIZINE HCL 25 MG PO TABS
12.5000 mg | ORAL_TABLET | Freq: Once | ORAL | Status: AC
  Administered 2023-06-16: 12.5 mg via ORAL
  Filled 2023-06-16: qty 1

## 2023-06-16 MED ORDER — GABAPENTIN 100 MG PO CAPS: 100.0000 mg | ORAL_CAPSULE | Freq: Every day | ORAL | Status: DC

## 2023-06-16 MED ORDER — POTASSIUM CHLORIDE 20 MEQ PO PACK
20.0000 meq | PACK | Freq: Once | ORAL | Status: AC
  Administered 2023-06-17: 20 meq via ORAL
  Filled 2023-06-16: qty 1

## 2023-06-16 MED ORDER — POTASSIUM CHLORIDE CRYS ER 20 MEQ PO TBCR: 20.0000 meq | EXTENDED_RELEASE_TABLET | Freq: Once | ORAL | Status: DC

## 2023-06-16 MED ORDER — PHENYTOIN SODIUM EXTENDED 100 MG PO CAPS
300.0000 mg | ORAL_CAPSULE | Freq: Every day | ORAL | Status: DC
  Administered 2023-06-17 – 2023-06-22 (×6): 300 mg via ORAL
  Filled 2023-06-16 (×6): qty 3

## 2023-06-16 MED ORDER — ACETAMINOPHEN 325 MG PO TABS
650.0000 mg | ORAL_TABLET | Freq: Four times a day (QID) | ORAL | Status: DC | PRN
  Administered 2023-06-17 – 2023-06-21 (×4): 650 mg via ORAL
  Filled 2023-06-16 (×5): qty 2

## 2023-06-16 MED ORDER — ACETAMINOPHEN 650 MG RE SUPP: 650.0000 mg | Freq: Four times a day (QID) | RECTAL | Status: DC | PRN

## 2023-06-16 MED ORDER — CEFAZOLIN SODIUM-DEXTROSE 1-4 GM/50ML-% IV SOLN
1.0000 g | Freq: Three times a day (TID) | INTRAVENOUS | Status: DC
  Administered 2023-06-17 – 2023-06-19 (×7): 1 g via INTRAVENOUS
  Filled 2023-06-16 (×8): qty 50

## 2023-06-16 MED ORDER — POTASSIUM CHLORIDE 10 MEQ/100ML IV SOLN
10.0000 meq | INTRAVENOUS | Status: AC
  Administered 2023-06-16 – 2023-06-17 (×4): 10 meq via INTRAVENOUS
  Filled 2023-06-16 (×3): qty 100

## 2023-06-16 MED ORDER — SODIUM CHLORIDE 0.9 % IV SOLN: INTRAVENOUS | Status: AC

## 2023-06-16 MED ORDER — SENNOSIDES-DOCUSATE SODIUM 8.6-50 MG PO TABS
1.0000 | ORAL_TABLET | Freq: Every evening | ORAL | Status: DC | PRN
  Administered 2023-06-20: 1 via ORAL
  Filled 2023-06-16: qty 1

## 2023-06-16 MED ORDER — POTASSIUM CHLORIDE CRYS ER 20 MEQ PO TBCR
40.0000 meq | EXTENDED_RELEASE_TABLET | Freq: Once | ORAL | Status: DC
  Filled 2023-06-16: qty 2

## 2023-06-16 MED ORDER — POTASSIUM CHLORIDE 20 MEQ PO PACK
40.0000 meq | PACK | Freq: Once | ORAL | Status: AC
  Administered 2023-06-16: 40 meq via ORAL
  Filled 2023-06-16: qty 2

## 2023-06-16 MED ORDER — MELOXICAM 15 MG PO TABS: 15.0000 mg | ORAL_TABLET | Freq: Every day | ORAL | Status: DC

## 2023-06-16 MED ORDER — ENOXAPARIN SODIUM 40 MG/0.4ML IJ SOSY
40.0000 mg | PREFILLED_SYRINGE | INTRAMUSCULAR | Status: DC
  Administered 2023-06-17 – 2023-06-22 (×6): 40 mg via SUBCUTANEOUS
  Filled 2023-06-16 (×6): qty 0.4

## 2023-06-16 MED ORDER — HYDROCODONE-ACETAMINOPHEN 10-325 MG PO TABS
1.0000 | ORAL_TABLET | ORAL | Status: DC | PRN
  Administered 2023-06-17 – 2023-06-21 (×7): 1 via ORAL
  Filled 2023-06-16 (×8): qty 1

## 2023-06-16 MED ORDER — PREDNISONE 5 MG PO TABS
5.0000 mg | ORAL_TABLET | Freq: Every day | ORAL | Status: DC
  Administered 2023-06-17 – 2023-06-21 (×5): 5 mg via ORAL
  Filled 2023-06-16 (×5): qty 1

## 2023-06-16 MED ORDER — PANTOPRAZOLE SODIUM 40 MG PO TBEC
40.0000 mg | DELAYED_RELEASE_TABLET | Freq: Every day | ORAL | Status: DC
  Administered 2023-06-17 – 2023-06-22 (×6): 40 mg via ORAL
  Filled 2023-06-16 (×6): qty 1

## 2023-06-16 MED ORDER — IRBESARTAN 300 MG PO TABS
300.0000 mg | ORAL_TABLET | Freq: Every day | ORAL | Status: DC
  Administered 2023-06-17 – 2023-06-22 (×6): 300 mg via ORAL
  Filled 2023-06-16 (×7): qty 1

## 2023-06-16 NOTE — ED Notes (Signed)
Per pt wife, pt cannot swallow large pills, okay received from Dr. Rhae Hammock to change potassium PO to drink mix form.

## 2023-06-16 NOTE — H&P (Signed)
History and Physical    Harwood D[place]oor FAO:[id number] DOB: 29-Apr-1946 DOA: 06/16/2023  PCP: [name], MD   Patient coming from:Home  Chief Complaint  Patient presents with   Weakness   Fall   Arm Injury      HPI:77 year old male with history of hypertension, hyperlipidemia, GERD seizure disorder, metastatic prostate cancer with spread to intra-abdominal lymph nodes on daily Zytiga, prednisone and Lupron every 3 months, B12 deficiency, chronic hypokalemia on oral potassium, poor historian coming to the ED due to dizziness, falling episodes at home.  Patient reported that he normally uses a cane and a walker at home at baseline, has been having issues with gait instability for past week and had fallen x 2 at home and recently fell yesterday on his left side and having pain around the left elbow.  He has been having difficulty mobilizing despite using cane and walker at home.Patient endorses polyuria but denied any dysuria cough congestion chest pain, nausea and vomiting In the ED: Vitals stable afebrile although mild tachycardia, not hypoxic.  Labs mostly showing mild hyponatremia, severe hypokalemia 2.2 normal mag troponin 20> 24 flat, chronic normocytic anemia 10 g. EKG showed prolonged Qtc. Left elbow x-ray diffuse soft tissue swelling no fracture or dislocation. MRI brain>> no acute finding, postoperative changes from prior right-sided craniotomy, is related atrophy Due to severe hypokalemia admission was requested for further management    Assessment/Plan Principal Problem:   Hypokalemia  Generalized weakness Debility/deconditioning Falls at home: MRI brain without acute stroke, no obvious fracture.  Likely in the setting of hypokalemia.  Check TSH, B12 level, Dilantin level.  Replace potassium.  Obtain PT OT evaluation  Hypokalemia: S/P 40 p.o. +40 IV in the ED. recheck potassium later tonight and in the morning.  Does have history of hypokalemia  EKG- prolonged  qtc: Monitoring telemetry. Repeat EKG in am.  Left elbow pain with erythema/tenderness: Xray with swelling no fracture, ?cellulitis- add antibiotics for now and monitor. Cbc in am  Hypertension Hyperlipidemia: Resume his atenolol, Benicar. Hold verapami. HCTZ and med list but med rec not done yet, will need to discontinue HCTZ if he is on it 2/2 hypokalemia.  GERD: Resume PPI  Seizure disorder-history of craniotomy: Continue his Dilantin, Neurontin, check Dilantin level  Metastatic prostate cancer with spread to intra-abdominal lymph nodes:  on daily Zytiga, prednisone and Lupron every 3 months.  Followed by Dr.Iruku, last seen in 02/2023 Severity of Illness: The appropriate patient status for this patient is OBSERVATION. Observation status is judged to be reasonable and necessary in order to provide the required intensity of service to ensure the patient's safety. The patient's presenting symptoms, physical exam findings, and initial radiographic and laboratory data in the context of their medical condition is felt to place them at decreased risk for further clinical deterioration. Furthermore, it is anticipated that the patient will be medically stable for discharge from the hospital within 2 midnights of admission.    DVT prophylaxis: enoxaparin (LOVENOX) injection 40 mg Start: 06/16/23 2215 SCDs Start: 06/16/23 2201 Code Status:   Code Status: Full Code  Family Communication: Admission, patients condition and plan of care including tests being ordered have been discussed with the patient (wife has left- not at bedside) who indicate understanding and agree with the plan and Code Status.  Consults called:  none  Review of Systems: All systems were reviewed and were negative except as mentioned in HPI above. Negative for fever Negative for chest pain Negative for shortness of breath  Past Medical History:  Diagnosis Date   Arthritis    Cancer [facility])    prostate    Frequency of  urination    Hypertension    Seizures (HCC)    after brain surg - none in past 20 yrs   Speech disorder    due to brain surg    Past Surgical History:  Procedure Laterality Date   BRAIN SURGERY     BLOOD CLOT REMOVED 1980   MASS EXCISION  02/24/2012   Procedure: MINOR EXCISION OF MASS;  Surgeon: Valetta Fuller, MD;  Location: [facility];  Service: Urology;;  excision of neck lesion   ROBOT ASSISTED LAPAROSCOPIC RADICAL PROSTATECTOMY  02/24/2012   Procedure: ROBOTIC ASSISTED LAPAROSCOPIC RADICAL PROSTATECTOMY;  Surgeon: Valetta Fuller, MD;  Location: [facility];  Service: Urology;  Laterality: N/A;            reports that he has been smoking cigarettes. He has never used smokeless tobacco. He reports that he does not drink alcohol and does not use drugs.  Not on File  History reviewed. No pertinent family history.   Prior to Admission medications   Medication Sig Start Date End Date Taking? Authorizing Provider  abiraterone acetate (ZYTIGA) 250 MG tablet TAKE 4 TABLETS (1000MG ) BY MOUTH DAILY ON AN EMPTY STOMACH, 1 HR BEFORE OR 2 HRS AFTER A MEAL 05/31/23   Iruku, Burnice Logan, MD  atenolol (TENORMIN) 25 MG tablet Take 25 mg by mouth daily.    [provider]  buprenorphine Lavera Guise) 7.5 MCG/HR  07/07/21   [provider]  cilostazol (PLETAL) 50 MG tablet Take 50 mg by mouth daily. 05/25/20   [provider]  fenofibrate (TRICOR) 48 MG tablet Take 48 mg by mouth daily.    [provider]  gabapentin (NEURONTIN) 100 MG capsule Take by mouth. 04/21/21   [provider]  hydrochlorothiazide (HYDRODIURIL) 25 MG tablet Take 25 mg by mouth daily. 06/24/20   [provider]  HYDROcodone-acetaminophen (NORCO) 10-325 MG tablet Take 1 tablet by mouth every 4 (four) hours as needed for moderate pain.    [provider]  latanoprost (XALATAN) 0.005 % ophthalmic solution Place 1 drop into both eyes every other day. At night 05/25/20   [provider]  meloxicam (MOBIC) 15 MG tablet Take 15 mg by mouth daily.    [provider]  olmesartan (BENICAR) 40 MG tablet Take 40 mg by mouth daily. 06/26/20   [provider]  omeprazole (PRILOSEC) 40 MG capsule Take 40 mg by mouth daily.    [provider]  phenytoin (DILANTIN) 100 MG ER capsule Take 300 mg by mouth daily.    [provider]  potassium chloride SA (KLOR-CON M) 20 MEQ tablet Take 2 tablets (40 mEq total) by mouth daily. 02/26/23   Rachel Moulds, MD  predniSONE (DELTASONE) 5 MG tablet TAKE 1 TABLET ONE TIME DAILY WITH BREAKFAST 11/11/22   Rachel Moulds, MD  verapamil (VERELAN PM) 360 MG 24 hr capsule Take 360 mg by mouth daily. 04/01/20   [provider]    Physical Exam: Vitals:   06/16/23 1859 06/16/23 1913 06/16/23 2018 06/16/23 2024  BP: (!) 155/93  (!) 175/88   Pulse: 94  98 (!) 104  Resp: 20   20  Temp:  99.7 F (37.6 C)    TempSrc:  Oral    SpO2: 99%  100% 100%  Weight:      Height:  General exam: AAOx3, weak HEENT:Oral mucosa moist, Ear/Nose WNL grossly, dentition normal. Respiratory system: bilaterally clear,no wheezing or crackles,no use of accessory muscle Cardiovascular system: S1 & S2 +, No JVD,. Gastrointestinal system: Abdomen soft, NT,ND, BS+ Nervous System:Alert, awake, moving extremities and grossly nonfocal Extremities: No edema, distal peripheral pulses palpable. , left elbow erythematous swollen and tender Skin: No rashes,no icterus. MSK: Normal muscle bulk,tone, power   Labs on Admission: I have personally reviewed following labs and imaging studies  CBC: Recent Labs  Lab 06/16/23 1553  WBC 10.3  HGB 10.0*  HCT 30.7*  MCV 90.0  PLT 141*   Basic Metabolic Panel: Recent Labs  Lab 06/16/23 1553 06/16/23 1806  NA 133*  --   K 2.2*  --   CL 94*  --   CO2 25  --   GLUCOSE 104*  --   BUN 14  --   CREATININE 0.88  --   CALCIUM 8.5*  --   MG  --  2.0   Recent Labs     06/16/23 1553 06/16/23 1806  TROPONINIHS 20* 24*    Radiological Exams on Admission: MR BRAIN WO CONTRAST  Result Date: 06/16/2023 CLINICAL DATA:  Initial evaluation for neuro deficit, stroke suspected. EXAM: MRI HEAD WITHOUT CONTRAST TECHNIQUE: Multiplanar, multiecho pulse sequences of the brain and surrounding structures were obtained without intravenous contrast. COMPARISON:  None Available. FINDINGS: Brain: Diffuse prominence of the CSF containing spaces compatible generalized cerebral atrophy. Mild chronic microvascular ischemic disease with a few small remote lacunar infarcts about the bilateral basal ganglia. Postoperative changes from prior right sided craniotomy. Chronic cystic encephalomalacia of with gliosis present within the underlying right frontotemporal region. Mild scattered chronic hemosiderin staining present within this location. No abnormal foci of restricted diffusion to suggest acute or subacute ischemia. Gray-white matter differentiation otherwise maintained. No other acute or chronic intracranial blood products. No mass lesion, midline shift or mass effect. Ventricular prominence related to global parenchymal volume loss without hydrocephalus. No extra-axial fluid collection. Pituitary gland suprasellar region within normal limits. Vascular: Major intracranial vascular flow voids are grossly maintained. Skull and upper cervical spine: Degenerative osteoarthritic changes noted about the skull base. Bone marrow signal intensity within normal limits. No acute scalp soft tissue abnormality. Sinuses/Orbits: Globes and orbital soft tissues within normal limits. Prior bilateral ocular lens replacement. Mild scattered mucosal thickening present about the ethmoidal air cells. Paranasal sinuses are otherwise largely clear. No mastoid effusion. Other: None. IMPRESSION: 1. No acute intracranial abnormality. 2. Postoperative changes from prior right sided craniotomy with underlying chronic cystic  encephalomalacia and gliosis within the right frontotemporal region. 3. Underlying age-related cerebral atrophy with mild chronic small vessel ischemic disease. Electronically Signed   By: Rise Mu M.D.   On: 06/16/2023 20:28   DG Elbow Complete Left  Result Date: 06/16/2023 CLINICAL DATA:  Recurrent falls with left arm pain and swelling EXAM: LEFT ELBOW - COMPLETE 4 VIEW COMPARISON:  None Available. FINDINGS: There is no evidence of fracture, dislocation, or joint effusion. Well corticated ovoid ossification seen only on oblique view, likely degenerative. Diffuse degenerative changes of the elbow. Diffuse soft tissue swelling. IMPRESSION: 1. No acute fracture or dislocation. 2. Diffuse soft tissue swelling. Electronically Signed   By: Agustin Cree M.D.   On: 06/16/2023 17:50    Lanae Boast MD [facility]  If 7PM-7AM, please contact night-coverage [email or web address]  06/16/2023, 10:05 PM

## 2023-06-16 NOTE — ED Notes (Signed)
Unable to urine sample.

## 2023-06-16 NOTE — ED Notes (Signed)
Patient to MRI.

## 2023-06-16 NOTE — ED Notes (Signed)
Pt back in room.

## 2023-06-16 NOTE — Hospital Course (Addendum)
77 year old male with history of hypertension, hyperlipidemia, GERD seizure disorder, metastatic prostate cancer with spread to intra-abdominal lymph nodes on daily Zytiga, prednisone and Lupron every 3 months, B12 deficiency, chronic hypokalemia on oral potassium, poor historian coming to the ED due to dizziness, falling episodes at home.  Patient reported that he normally uses a cane and a walker at home at baseline, has been having issues with gait instability for past week and had fallen x 2 at home and recently fell yesterday on his left side and having pain around the left elbow.  He has been having difficulty mobilizing despite using cane and walker at home.Patient endorses polyuria but denied any dysuria cough congestion chest pain, nausea and vomiting In the ED: Vitals stable afebrile although mild tachycardia, not hypoxic.  Labs mostly showing mild hyponatremia, severe hypokalemia 2.2 normal mag troponin 20> 24 flat, chronic normocytic anemia 10 g. EKG showed prolonged Qtc. Left elbow x-ray diffuse soft tissue swelling no fracture or dislocation. MRI brain>> no acute finding, postoperative changes from prior right-sided craniotomy, is related atrophy Due to severe hypokalemia admission was requested for further management

## 2023-06-16 NOTE — ED Notes (Signed)
Pt Getting imaging done.

## 2023-06-16 NOTE — ED Triage Notes (Signed)
Pt BIBA for increasing falls and unable to ambulate with device today due to increasing weakness.  Pt has recurrent left arm pain and swelling. Pt is A&Ox4 Pt denies hitting head  Pt denies losing consciousness Pt denies taking blood thinners Pt has elevated blood pressure 180/97, denies taking medication today

## 2023-06-16 NOTE — ED Provider Notes (Signed)
[place] EMERGENCY DEPARTMENT AT [facility] Provider Note   CSN: [id number] Arrival date & time: 06/16/23  1457     History  Chief Complaint  Patient presents with   Weakness   Fall   Arm Injury    Joseph Rangel is a 77 y.o. male.  77 year old male with past medical history of hypertension, seizures, and prostate cancer with unknown cancer status the patient is a poor historian presenting to the emergency department today with dizziness.  The patient states he has been having issues with gait instability now for the past week.  He states he is fallen twice.  He states he fell yesterday on his left arm is having some pain around the left elbow.  He reports that he normally uses a cane and walker at home at baseline and is very difficult doing this over the past few days.  He initially presented denies any weakness for the motor questioning he thinks that his left side may be a little weaker for the past week.  He denies any known history of strokes.  He denies any chest pain or lightheadedness.  He came to the emergency department today for further evaluation.  He does report some polyuria but denies any dysuria.  Denies any cough or congestion.   Weakness Associated symptoms: dizziness and frequency   Fall  Arm Injury      Home Medications Prior to Admission medications   Medication Sig Start Date End Date Taking? Authorizing Provider  abiraterone acetate (ZYTIGA) 250 MG tablet TAKE 4 TABLETS (1000MG ) BY MOUTH DAILY ON AN EMPTY STOMACH, 1 HR BEFORE OR 2 HRS AFTER A MEAL 05/31/23   Iruku, Burnice Logan, MD  atenolol (TENORMIN) 25 MG tablet Take 25 mg by mouth daily.    [provider]  buprenorphine Lavera Guise) 7.5 MCG/HR  07/07/21   [provider]  cilostazol (PLETAL) 50 MG tablet Take 50 mg by mouth daily. 05/25/20   [provider]  fenofibrate (TRICOR) 48 MG tablet Take 48 mg by mouth daily.    [provider]  gabapentin (NEURONTIN)  100 MG capsule Take by mouth. 04/21/21   [provider]  hydrochlorothiazide (HYDRODIURIL) 25 MG tablet Take 25 mg by mouth daily. 06/24/20   [provider]  HYDROcodone-acetaminophen (NORCO) 10-325 MG tablet Take 1 tablet by mouth every 4 (four) hours as needed for moderate pain.    [provider]  latanoprost (XALATAN) 0.005 % ophthalmic solution Place 1 drop into both eyes every other day. At night 05/25/20   [provider]  meloxicam (MOBIC) 15 MG tablet Take 15 mg by mouth daily.    [provider]  olmesartan (BENICAR) 40 MG tablet Take 40 mg by mouth daily. 06/26/20   [provider]  omeprazole (PRILOSEC) 40 MG capsule Take 40 mg by mouth daily.    [provider]  phenytoin (DILANTIN) 100 MG ER capsule Take 300 mg by mouth daily.    [provider]  potassium chloride SA (KLOR-CON M) 20 MEQ tablet Take 2 tablets (40 mEq total) by mouth daily. 02/26/23   Rachel Moulds, MD  predniSONE (DELTASONE) 5 MG tablet TAKE 1 TABLET ONE TIME DAILY WITH BREAKFAST 11/11/22   Rachel Moulds, MD  verapamil (VERELAN PM) 360 MG 24 hr capsule Take 360 mg by mouth daily. 04/01/20   [provider]      Allergies    Patient has no allergy information on record.    Review of Systems  Review of Systems  Genitourinary:  Positive for frequency.  Neurological:  Positive for dizziness and weakness.  All other systems reviewed and are negative.   Physical Exam Updated Vital Signs BP (!) 175/88   Pulse (!) 104   Temp 99.7 F (37.6 C) (Oral)   Resp 20   Ht 5\' 9"  (1.753 m)   Wt 72.1 kg   SpO2 100%   BMI 23.48 kg/m  Physical Exam Vitals and nursing note reviewed.   Gen: NAD Eyes: PERRL, EOMI HEENT: no oropharyngeal swelling Neck: trachea midline Resp: clear to auscultation bilaterally Card: RRR, no murmurs, rubs, or gallops Abd: nontender, nondistended Extremities: The patient is tender over the lateral aspect of his  left elbow with mild swelling noted with normal range of motion.  The remainder of the extremities are atraumatic.  Compartments are soft. Vascular: 2+ radial pulses bilaterally, 2+ DP pulses bilaterally Neuro: See NIH stroke scale Skin: no rashes Psyc: acting appropriately   ED Results / Procedures / Treatments   Labs (all labs ordered are listed, but only abnormal results are displayed) Labs Reviewed  BASIC METABOLIC PANEL - Abnormal; Notable for the following components:      Result Value   Sodium 133 (*)    Potassium 2.2 (*)    Chloride 94 (*)    Glucose, Bld 104 (*)    Calcium 8.5 (*)    All other components within normal limits  CBC - Abnormal; Notable for the following components:   RBC 3.41 (*)    Hemoglobin 10.0 (*)    HCT 30.7 (*)    Platelets 141 (*)    All other components within normal limits  TROPONIN I (HIGH SENSITIVITY) - Abnormal; Notable for the following components:   Troponin I (High Sensitivity) 20 (*)    All other components within normal limits  TROPONIN I (HIGH SENSITIVITY) - Abnormal; Notable for the following components:   Troponin I (High Sensitivity) 24 (*)    All other components within normal limits  MAGNESIUM  URINALYSIS, ROUTINE W REFLEX MICROSCOPIC    EKG EKG Interpretation Date/Time:  Wednesday June 16 2023 15:48:24 EDT Ventricular Rate:  92 PR Interval:  175 QRS Duration:  98 QT Interval:  428 QTC Calculation: 530 R Axis:   -43  Text Interpretation: Sinus rhythm Left anterior fascicular block Abnormal R-wave progression, late transition Borderline repol abnormality, diffuse leads Prolonged QT interval Confirmed by Beckey Downing [phone]) on 06/16/2023 5:54:58 PM  Radiology MR BRAIN WO CONTRAST  Result Date: 06/16/2023 CLINICAL DATA:  Initial evaluation for neuro deficit, stroke suspected. EXAM: MRI HEAD WITHOUT CONTRAST TECHNIQUE: Multiplanar, multiecho pulse sequences of the brain and surrounding structures were obtained without  intravenous contrast. COMPARISON:  None Available. FINDINGS: Brain: Diffuse prominence of the CSF containing spaces compatible generalized cerebral atrophy. Mild chronic microvascular ischemic disease with a few small remote lacunar infarcts about the bilateral basal ganglia. Postoperative changes from prior right sided craniotomy. Chronic cystic encephalomalacia of with gliosis present within the underlying right frontotemporal region. Mild scattered chronic hemosiderin staining present within this location. No abnormal foci of restricted diffusion to suggest acute or subacute ischemia. Gray-white matter differentiation otherwise maintained. No other acute or chronic intracranial blood products. No mass lesion, midline shift or mass effect. Ventricular prominence related to global parenchymal volume loss without hydrocephalus. No extra-axial fluid collection. Pituitary gland suprasellar region within normal limits. Vascular: Major intracranial vascular flow voids are grossly maintained. Skull and upper cervical spine: Degenerative osteoarthritic changes noted about  the skull base. Bone marrow signal intensity within normal limits. No acute scalp soft tissue abnormality. Sinuses/Orbits: Globes and orbital soft tissues within normal limits. Prior bilateral ocular lens replacement. Mild scattered mucosal thickening present about the ethmoidal air cells. Paranasal sinuses are otherwise largely clear. No mastoid effusion. Other: None. IMPRESSION: 1. No acute intracranial abnormality. 2. Postoperative changes from prior right sided craniotomy with underlying chronic cystic encephalomalacia and gliosis within the right frontotemporal region. 3. Underlying age-related cerebral atrophy with mild chronic small vessel ischemic disease. Electronically Signed   By: Rise Mu M.D.   On: 06/16/2023 20:28   DG Elbow Complete Left  Result Date: 06/16/2023 CLINICAL DATA:  Recurrent falls with left arm pain and  swelling EXAM: LEFT ELBOW - COMPLETE 4 VIEW COMPARISON:  None Available. FINDINGS: There is no evidence of fracture, dislocation, or joint effusion. Well corticated ovoid ossification seen only on oblique view, likely degenerative. Diffuse degenerative changes of the elbow. Diffuse soft tissue swelling. IMPRESSION: 1. No acute fracture or dislocation. 2. Diffuse soft tissue swelling. Electronically Signed   By: Agustin Cree M.D.   On: 06/16/2023 17:50    Procedures Procedures    Medications Ordered in ED Medications  potassium chloride 10 mEq in 100 mL IVPB (10 mEq Intravenous New Bag/Given 06/16/23 2031)  0.9 %  sodium chloride infusion ( Intravenous Infusion Verify 06/16/23 2017)  meclizine (ANTIVERT) tablet 12.5 mg (12.5 mg Oral Given 06/16/23 1659)  potassium chloride (KLOR-CON) packet 40 mEq (40 mEq Oral Given 06/16/23 2017)    ED Course/ Medical Decision Making/ A&P                   NIH Stroke Scale: 2              Medical Decision Making 77 year old male with past medical history of hypertension, seizures, and prostate cancer presented emergency department today with dizziness.  I will further evaluate patient here with basic labs to evaluate for electrolyte abnormalities in addition to an MRI of his brain to evaluate for possible CVA or central cause.  I will give patient meclizine in the event this due to vertigo.  Will keep the patient on the cardiac monitor here to evaluate for arrhythmia.  Will obtain an x-ray of his left arm to out for fracture or dislocation.  I will reevaluate for ultimate disposition.  The patient's potassium is low at 2.2.  The patient is ordered IV and oral potassium.  Magnesium level is ordered.  Patient's EKG does show some QT prolongation.  The patient's MRI does not show any acute findings but does show some microvascular changes and chronic findings.  Calls placed to hospitalist service for admission.  Critical care time 35 minutes including reassessments,  review of old records, treatment severe hypokalemia with IV and oral potassium, coordination care hospital service  Amount and/or Complexity of Data Reviewed Labs: ordered. Radiology: ordered.  Risk Prescription drug management. Decision regarding hospitalization.           Final Clinical Impression(s) / ED Diagnoses Final diagnoses:  Hypokalemia  QT prolongation    Rx / DC Orders ED Discharge Orders     None         Durwin Glaze, MD 06/16/23 2131

## 2023-06-17 DIAGNOSIS — C61 Malignant neoplasm of prostate: Secondary | ICD-10-CM | POA: Diagnosis present

## 2023-06-17 DIAGNOSIS — E538 Deficiency of other specified B group vitamins: Secondary | ICD-10-CM | POA: Diagnosis present

## 2023-06-17 DIAGNOSIS — R5381 Other malaise: Secondary | ICD-10-CM | POA: Diagnosis present

## 2023-06-17 DIAGNOSIS — E876 Hypokalemia: Secondary | ICD-10-CM | POA: Diagnosis present

## 2023-06-17 DIAGNOSIS — W19XXXA Unspecified fall, initial encounter: Secondary | ICD-10-CM

## 2023-06-17 DIAGNOSIS — I10 Essential (primary) hypertension: Secondary | ICD-10-CM | POA: Diagnosis present

## 2023-06-17 DIAGNOSIS — R296 Repeated falls: Secondary | ICD-10-CM | POA: Diagnosis present

## 2023-06-17 DIAGNOSIS — R3589 Other polyuria: Secondary | ICD-10-CM | POA: Diagnosis present

## 2023-06-17 DIAGNOSIS — L03114 Cellulitis of left upper limb: Secondary | ICD-10-CM | POA: Diagnosis present

## 2023-06-17 DIAGNOSIS — E871 Hypo-osmolality and hyponatremia: Secondary | ICD-10-CM

## 2023-06-17 DIAGNOSIS — E785 Hyperlipidemia, unspecified: Secondary | ICD-10-CM | POA: Diagnosis present

## 2023-06-17 DIAGNOSIS — Y92009 Unspecified place in unspecified non-institutional (private) residence as the place of occurrence of the external cause: Secondary | ICD-10-CM | POA: Diagnosis not present

## 2023-06-17 DIAGNOSIS — R531 Weakness: Secondary | ICD-10-CM

## 2023-06-17 DIAGNOSIS — Z791 Long term (current) use of non-steroidal anti-inflammatories (NSAID): Secondary | ICD-10-CM | POA: Diagnosis not present

## 2023-06-17 DIAGNOSIS — R9431 Abnormal electrocardiogram [ECG] [EKG]: Secondary | ICD-10-CM | POA: Diagnosis not present

## 2023-06-17 DIAGNOSIS — F17210 Nicotine dependence, cigarettes, uncomplicated: Secondary | ICD-10-CM | POA: Diagnosis present

## 2023-06-17 DIAGNOSIS — G40909 Epilepsy, unspecified, not intractable, without status epilepticus: Secondary | ICD-10-CM | POA: Diagnosis present

## 2023-06-17 DIAGNOSIS — C772 Secondary and unspecified malignant neoplasm of intra-abdominal lymph nodes: Secondary | ICD-10-CM | POA: Diagnosis present

## 2023-06-17 DIAGNOSIS — K219 Gastro-esophageal reflux disease without esophagitis: Secondary | ICD-10-CM | POA: Diagnosis present

## 2023-06-17 DIAGNOSIS — D630 Anemia in neoplastic disease: Secondary | ICD-10-CM | POA: Diagnosis present

## 2023-06-17 DIAGNOSIS — E669 Obesity, unspecified: Secondary | ICD-10-CM | POA: Diagnosis present

## 2023-06-17 DIAGNOSIS — Z7902 Long term (current) use of antithrombotics/antiplatelets: Secondary | ICD-10-CM | POA: Diagnosis not present

## 2023-06-17 DIAGNOSIS — Z79899 Other long term (current) drug therapy: Secondary | ICD-10-CM | POA: Diagnosis not present

## 2023-06-17 DIAGNOSIS — R2689 Other abnormalities of gait and mobility: Secondary | ICD-10-CM | POA: Diagnosis present

## 2023-06-17 DIAGNOSIS — M7989 Other specified soft tissue disorders: Secondary | ICD-10-CM | POA: Diagnosis present

## 2023-06-17 LAB — BASIC METABOLIC PANEL
Anion gap: 10 (ref 5–15)
Anion gap: 8 (ref 5–15)
BUN: 13 mg/dL (ref 8–23)
BUN: 15 mg/dL (ref 8–23)
CO2: 26 mmol/L (ref 22–32)
CO2: 26 mmol/L (ref 22–32)
Calcium: 8 mg/dL — ABNORMAL LOW (ref 8.9–10.3)
Calcium: 7.9 mg/dL — ABNORMAL LOW (ref 8.9–10.3)
Chloride: 98 mmol/L (ref 98–111)
Chloride: 97 mmol/L — ABNORMAL LOW (ref 98–111)
Creatinine, Ser: 0.92 mg/dL (ref 0.61–1.24)
Creatinine, Ser: 1.06 mg/dL (ref 0.61–1.24)
GFR, Estimated: >60 mL/min (ref >60)
GFR, Estimated: >60 mL/min (ref >60)
Glucose, Bld: 83 mg/dL (ref 70–99)
Glucose, Bld: 185 mg/dL — ABNORMAL HIGH (ref 70–99)
Potassium: 2.2 mmol/L — CL (ref 3.5–5.1)
Potassium: 2.3 mmol/L — CL (ref 3.5–5.1)
Sodium: 134 mmol/L — ABNORMAL LOW (ref 135–145)
Sodium: 131 mmol/L — ABNORMAL LOW (ref 135–145)

## 2023-06-17 LAB — CBC
HCT: 26.3 % — ABNORMAL LOW (ref 39.0–52.0)
Hemoglobin: 8.7 g/dL — ABNORMAL LOW (ref 13.0–17.0)
MCH: 29.6 pg (ref 26.0–34.0)
MCHC: 33.1 g/dL (ref 30.0–36.0)
MCV: 89.5 fL (ref 80.0–100.0)
Platelets: 178 10*3/uL (ref 150–400)
RBC: 2.94 MIL/uL — ABNORMAL LOW (ref 4.22–5.81)
RDW: 12.8 % (ref 11.5–15.5)
WBC: 9.6 10*3/uL (ref 4.0–10.5)
nRBC: 0 % (ref 0.0–0.2)

## 2023-06-17 LAB — PHOSPHORUS: Phosphorus: 2.7 mg/dL (ref 2.5–4.6)

## 2023-06-17 LAB — VITAMIN B12: Vitamin B-12: 272 pg/mL (ref 180–914)

## 2023-06-17 LAB — CREATININE, SERUM
Creatinine, Ser: 1.06 mg/dL (ref 0.61–1.24)
GFR, Estimated: >60 mL/min (ref >60)

## 2023-06-17 LAB — PHENYTOIN LEVEL, TOTAL: Phenytoin Lvl: 8.5 ug/mL — ABNORMAL LOW (ref 10.0–20.0)

## 2023-06-17 LAB — POTASSIUM
Potassium: 2.8 mmol/L — ABNORMAL LOW (ref 3.5–5.1)
Potassium: 3.8 mmol/L (ref 3.5–5.1)

## 2023-06-17 LAB — MAGNESIUM: Magnesium: 1.8 mg/dL (ref 1.7–2.4)

## 2023-06-17 LAB — TSH: TSH: 1.243 u[IU]/mL (ref 0.350–4.500)

## 2023-06-17 MED ORDER — POTASSIUM CHLORIDE CRYS ER 20 MEQ PO TBCR
40.0000 meq | EXTENDED_RELEASE_TABLET | ORAL | Status: AC
  Administered 2023-06-17 (×2): 40 meq via ORAL
  Filled 2023-06-17 (×2): qty 2

## 2023-06-17 MED ORDER — MAGNESIUM SULFATE 2 GM/50ML IV SOLN
2.0000 g | Freq: Once | INTRAVENOUS | Status: AC
  Administered 2023-06-17: 2 g via INTRAVENOUS
  Filled 2023-06-17: qty 50

## 2023-06-17 MED ORDER — ATENOLOL 25 MG PO TABS
25.0000 mg | ORAL_TABLET | Freq: Every day | ORAL | Status: DC
  Administered 2023-06-17 – 2023-06-22 (×6): 25 mg via ORAL
  Filled 2023-06-17 (×6): qty 1

## 2023-06-17 MED ORDER — POTASSIUM CHLORIDE 10 MEQ/100ML IV SOLN
10.0000 meq | INTRAVENOUS | Status: AC
  Administered 2023-06-17 (×4): 10 meq via INTRAVENOUS
  Filled 2023-06-17 (×4): qty 100

## 2023-06-17 MED ORDER — MAGNESIUM SULFATE 50 % IJ SOLN: 2.0000 g | Freq: Once | INTRAVENOUS | Status: DC

## 2023-06-17 NOTE — Progress Notes (Signed)
[facility]                                                                              Joseph Rangel, is a 77 y.o. male, DOB - 1946/03/28, ONG:[id number] Admit date - 06/16/2023    Outpatient Primary MD for the patient is [name], MD  LOS - 0  days  Chief Complaint  Patient presents with   Weakness   Fall   Arm Injury       Brief summary   Patient is a 77 year old male with HTN, HLP, GERD, seizure disorder, metastatic prostate CA with spread to intra-abdominal lymph nodes on daily Zytiga, prednisone, Lupron every 3 months, B12 deficiency, chronic hypokalemia on oral potassium presented with dizziness and falling episodes at home.  Per patient, he normally uses a cane and a walker at home at baseline however has been having issues with gait instability for the past week, fallen x 2 at home.  Recently fell a day before the admission on his left side with pain around his left elbow.  He also reported polyuria but no dysuria. In ED, mild tachycardia but no hypoxia.  Mild hyponatremia, severe hypokalemia 2.2.  EKG showed prolonged QTc. Left elbow x-ray diffuse soft tissue swelling, no fracture or dislocation MRI brain showed no acute findings, postop changes from prior right-sided craniotomy, age-related atrophy. Patient was admitted for further workup.   Assessment & Plan    Principal Problem: Severe hypokalemia -Potassium 2.2 on admission, was replaced however still 2.2 this morning, repeated 2.8.  Magnesium 1.8 -Replaced on aggressive potassium replacement p.o. 40 mEq every 4 hours x 2 doses, IV x 4, magnesium 2 g IV x 1 -Noted patient is on hydrochlorothiazide at home, will need to discontinue it at discharge   Active Problems: Prolonged QTc -QTc on admission 530, improved to  QTc 467 -Avoid QT prolonging drugs - keep K ~4, Mag ~2    Generalized weakness   Fall at home -MRI brain showed no acute stroke, likely in the setting of hyponatremia,  hypokalemia -B12 within normal limits, follow TSH.  Replace electrolytes -Phenytoin level pending -PT OT eval   Hyponatremia -Improving  Essential hypertension -Resume atenolol, continue irbesartan -HCTZ discontinued    Prostate cancer metastatic to intraabdominal lymph node (HCC) -On Zytiga, prednisone, Lupron every 3 months -Follows Dr. Al Pimple, last seen in 02/2023  Chronic normocytic normochromic anemia -Currently close to baseline, no obvious bleeding   Seizure disorder -Continue Dilantin, levels still pending  GERD Continue PPI  Left elbow pain -Status post fall, x-ray negative for any fracture or dislocation.  Estimated body mass index is 23.48 kg/m as calculated from the following:   Height as of this encounter: 5\' 9"  (1.753 m).   Weight as of this encounter: 72.1 kg.  Code Status: Full code DVT Prophylaxis:  enoxaparin (LOVENOX) injection 40 mg Start: 06/17/23 1000 SCDs Start: 06/16/23 2201   Level of Care: Level of care: Telemetry Family Communication: Updated patient Disposition Plan:      Remains inpatient appropriate: PT OT evaluation pending   Procedures:  None  Consultants:   None  Antimicrobials:   Anti-infectives (  From admission, onward)    Start     Dose/Rate Route Frequency Ordered Stop   06/16/23 2215  ceFAZolin (ANCEF) IVPB 1 g/50 mL premix        1 g 100 mL/hr over 30 Minutes Intravenous Every 8 hours 06/16/23 2204 06/23/23 2159          Medications  enoxaparin (LOVENOX) injection  40 mg Subcutaneous Q24H   gabapentin  100 mg Oral Q0600   irbesartan  300 mg Oral Daily   meloxicam  15 mg Oral Daily   pantoprazole  40 mg Oral Daily   phenytoin  300 mg Oral Daily   potassium chloride  40 mEq Oral Q4H   predniSONE  5 mg Oral Q breakfast      Subjective:   Joseph Rangel was seen and examined today.  Still with persistent hypokalemia, generalized weakness.  No acute nausea vomiting, chest pain, shortness of breath.  No  diarrhea. No acute events overnight.    Objective:   Vitals:   06/17/23 0900 06/17/23 0915 06/17/23 0930 06/17/23 0959  BP:    132/71  Pulse: (!) 106 (!) 103  (!) 105  Resp: (!) 24 (!) 24 (!) 26 20  Temp:    99.2 F (37.3 C)  TempSrc:    Oral  SpO2: 98% 97%  97%  Weight:      Height:        Intake/Output Summary (Last 24 hours) at 06/17/2023 1049 Last data filed at 06/17/2023 0725 Gross per 24 hour  Intake 1356.92 ml  Output --  Net 1356.92 ml     Wt Readings from Last 3 Encounters:  06/16/23 72.1 kg  02/25/23 73.2 kg  12/03/22 75.4 kg     Exam General: Alert and oriented x 3, NAD Cardiovascular: S1 S2 auscultated,  RRR Respiratory: Clear to auscultation bilaterally, no wheezing Gastrointestinal: Soft, nontender, nondistended, + bowel sounds Ext: no pedal edema bilaterally Neuro: no new deficit Skin: Left elbow tenderness Psych: Normal affect     Data Reviewed:  I have personally reviewed following labs    CBC Lab Results  Component Value Date   WBC 9.6 06/17/2023   RBC 2.94 (L) 06/17/2023   HGB 8.7 (L) 06/17/2023   HCT 26.3 (L) 06/17/2023   MCV 89.5 06/17/2023   MCH 29.6 06/17/2023   PLT 178 06/17/2023   MCHC 33.1 06/17/2023   RDW 12.8 06/17/2023   LYMPHSABS 2.0 02/25/2023   MONOABS 0.7 02/25/2023   EOSABS 0.2 02/25/2023   BASOSABS 0.1 02/25/2023     Last metabolic panel Lab Results  Component Value Date   NA 134 (L) 06/17/2023   K 2.8 (L) 06/17/2023   CL 98 06/17/2023   CO2 26 06/17/2023   BUN 13 06/17/2023   CREATININE 0.92 06/17/2023   GLUCOSE 83 06/17/2023   GFRNONAA >60 06/17/2023   GFRAA >90 02/25/2012   CALCIUM 8.0 (L) 06/17/2023   PHOS 2.7 06/17/2023   PROT 7.6 02/25/2023   ALBUMIN 4.2 02/25/2023   LABGLOB 3.4 08/09/2020   AGRATIO 1.1 08/09/2020   BILITOT 0.4 02/25/2023   ALKPHOS 166 (H) 02/25/2023   AST 11 (L) 02/25/2023   ALT 5 02/25/2023   ANIONGAP 10 06/17/2023    CBG (last 3)  No results for input(s): "GLUCAP" in  the last 72 hours.    Coagulation Profile: No results for input(s): "INR", "PROTIME" in the last 168 hours.   Radiology Studies: I have personally reviewed the imaging studies  MR BRAIN  WO CONTRAST  Result Date: 06/16/2023 CLINICAL DATA:  Initial evaluation for neuro deficit, stroke suspected. EXAM: MRI HEAD WITHOUT CONTRAST TECHNIQUE: Multiplanar, multiecho pulse sequences of the brain and surrounding structures were obtained without intravenous contrast. COMPARISON:  None Available. FINDINGS: Brain: Diffuse prominence of the CSF containing spaces compatible generalized cerebral atrophy. Mild chronic microvascular ischemic disease with a few small remote lacunar infarcts about the bilateral basal ganglia. Postoperative changes from prior right sided craniotomy. Chronic cystic encephalomalacia of with gliosis present within the underlying right frontotemporal region. Mild scattered chronic hemosiderin staining present within this location. No abnormal foci of restricted diffusion to suggest acute or subacute ischemia. Gray-white matter differentiation otherwise maintained. No other acute or chronic intracranial blood products. No mass lesion, midline shift or mass effect. Ventricular prominence related to global parenchymal volume loss without hydrocephalus. No extra-axial fluid collection. Pituitary gland suprasellar region within normal limits. Vascular: Major intracranial vascular flow voids are grossly maintained. Skull and upper cervical spine: Degenerative osteoarthritic changes noted about the skull base. Bone marrow signal intensity within normal limits. No acute scalp soft tissue abnormality. Sinuses/Orbits: Globes and orbital soft tissues within normal limits. Prior bilateral ocular lens replacement. Mild scattered mucosal thickening present about the ethmoidal air cells. Paranasal sinuses are otherwise largely clear. No mastoid effusion. Other: None. IMPRESSION: 1. No acute intracranial  abnormality. 2. Postoperative changes from prior right sided craniotomy with underlying chronic cystic encephalomalacia and gliosis within the right frontotemporal region. 3. Underlying age-related cerebral atrophy with mild chronic small vessel ischemic disease. Electronically Signed   By: Rise Mu M.D.   On: 06/16/2023 20:28   DG Elbow Complete Left  Result Date: 06/16/2023 CLINICAL DATA:  Recurrent falls with left arm pain and swelling EXAM: LEFT ELBOW - COMPLETE 4 VIEW COMPARISON:  None Available. FINDINGS: There is no evidence of fracture, dislocation, or joint effusion. Well corticated ovoid ossification seen only on oblique view, likely degenerative. Diffuse degenerative changes of the elbow. Diffuse soft tissue swelling. IMPRESSION: 1. No acute fracture or dislocation. 2. Diffuse soft tissue swelling. Electronically Signed   By: Agustin Cree M.D.   On: 06/16/2023 17:50       Decklyn Hornik M.D. [facility] 06/17/2023, 10:49 AM  Available via Epic secure chat 7am-7pm After 7 pm, please refer to night coverage provider listed on amion.

## 2023-06-17 NOTE — Plan of Care (Signed)
  Problem: Health Behavior/Discharge Planning: Goal: Ability to manage health-related needs will improve Outcome: Progressing   Problem: Clinical Measurements: Goal: Ability to maintain clinical measurements within normal limits will improve Outcome: Progressing Goal: Will remain free from infection Outcome: Progressing Goal: Diagnostic test results will improve Outcome: Progressing   

## 2023-06-17 NOTE — Progress Notes (Signed)
OT Cancellation Note  Patient Details Name: Cashus Wagy MRN: [id number] DOB: 22-May-1946   Cancelled Treatment:    Reason Eval/Treat Not Completed: Medical issues which prohibited therapy. Pt with critical potassium level of 2.3 Will hold today and follow-up at later date, as appropriate.   Reuben Likes, OTR/L 06/17/2023, 4:55 PM

## 2023-06-17 NOTE — TOC Initial Note (Signed)
Transition of Care [facility]) - Initial/Assessment Note    Patient Details  Name: Joseph Rangel MRN: [id number] Date of Birth: 11-Feb-1946  [facility] CM/SW Contact:    Lanier Clam, RN Phone Number: 06/17/2023, 3:28 PM  Clinical Narrative: PT/OT cons await recc.                  Expected Discharge Plan:  (TBD) Barriers to Discharge: Continued Medical Work up   Patient Goals and CMS Choice Patient states their goals for this hospitalization and ongoing recovery are:: Home CMS Medicare.gov Compare Post Acute Care list provided to:: Patient Represenative (must comment)   [place] ownership interest in [facility].provided to:: Patient    Expected Discharge Plan and Services                                              Prior Living Arrangements/Services                       Activities of Daily Living   ADL Screening (condition at time of admission) Does the patient have a NEW difficulty with bathing/dressing/toileting/self-feeding that is expected to last >3 days?: Yes (Initiates electronic notice to provider for possible OT consult) Does the patient have a NEW difficulty with getting in/out of bed, walking, or climbing stairs that is expected to last >3 days?: Yes (Initiates electronic notice to provider for possible PT consult) Does the patient have a NEW difficulty with communication that is expected to last >3 days?: No Is the patient deaf or have difficulty hearing?: No Does the patient have difficulty seeing, even when wearing glasses/contacts?: No Does the patient have difficulty concentrating, remembering, or making decisions?: Yes  Permission Sought/Granted                  Emotional Assessment              Admission diagnosis:  Hypokalemia [E87.6] QT prolongation [R94.31] Patient Active Problem List   Diagnosis Date Noted   Hyponatremia 06/17/2023   Generalized weakness 06/17/2023   Fall at home, initial  encounter 06/17/2023   Normocytic normochromic anemia 06/25/2021   Hypokalemia 03/07/2021   AKI (acute kidney injury) (HCC) 01/31/2021   Right leg pain 01/31/2021   Prostate cancer metastatic to intraabdominal lymph node (HCC) 10/02/2020   PCP:  [name], MD Pharmacy:   [facility] [phone] - [place], [place] - [address] AT [facility] [address] [facility] [address] [place] [address] Phone: [phone] Fax: [phone]  [facility] [address] [place] [address] Phone: [phone] Fax: [phone]  [facility] - [place], [place] - [address] [place] [address] Phone: [phone] Fax: [phone]  [facility] - [place], [place] - [address] [place] [address] Phone: [phone] Fax: [phone]     Social Determinants of Health (SDOH) Social History: SDOH Screenings   Food Insecurity: No Food Insecurity (06/17/2023)  Housing: Low Risk  (06/17/2023)  Transportation Needs: No Transportation Needs (06/17/2023)  Utilities: Not At Risk (06/17/2023)  Tobacco Use: High Risk (06/16/2023)   SDOH Interventions:     Readmission Risk Interventions     No data to display

## 2023-06-17 NOTE — ED Notes (Signed)
ED TO INPATIENT HANDOFF REPORT  ED Nurse Name and Phone #: Katherina Mires Name/Age/Gender Joseph Rangel 77 y.o. male Room/Bed: WA11/WA11  Code Status   Code Status: Full Code  Home/SNF/Other Home Patient oriented to: self, place, time, situation Is this baseline? Yes   Triage Complete: Triage complete  Chief Complaint Hypokalemia [E87.6]  Triage Note Pt BIBA for increasing falls and unable to ambulate with device today due to increasing weakness.  Pt has recurrent left arm pain and swelling. Pt is A&Ox4 Pt denies hitting head  Pt denies losing consciousness Pt denies taking blood thinners Pt has elevated blood pressure 180/97, denies taking medication today   Allergies Not on File  Level of Care/Admitting Diagnosis ED Disposition     ED Disposition  Admit   Condition  --   Comment  Hospital Area: [facility] [[id number]]  Level of Care: Telemetry [5]  Admit to tele based on following criteria: Monitor QTC interval  May place patient in observation at [facility] or [facility] if equivalent level of care is available:: No  Covid Evaluation: Asymptomatic - no recent exposure (last 10 days) testing not required  Diagnosis: Hypokalemia [172180]  Admitting Physician: Lanae Boast [[id number]]  Attending Physician: Lanae Boast [[id number]]          B Medical/Surgery History Past Medical History:  Diagnosis Date   Arthritis    Cancer (HCC)    prostate    Frequency of urination    Hypertension    Seizures (HCC)    after brain surg - none in past 20 yrs   Speech disorder    due to brain surg   Past Surgical History:  Procedure Laterality Date   BRAIN SURGERY     BLOOD CLOT REMOVED 1980   MASS EXCISION  02/24/2012   Procedure: MINOR EXCISION OF MASS;  Surgeon: Valetta Fuller, MD;  Location: [facility];  Service: Urology;;  excision of neck lesion   ROBOT ASSISTED LAPAROSCOPIC RADICAL PROSTATECTOMY  02/24/2012   Procedure: ROBOTIC ASSISTED LAPAROSCOPIC RADICAL  PROSTATECTOMY;  Surgeon: Valetta Fuller, MD;  Location: [facility];  Service: Urology;  Laterality: N/A;            A IV Location/Drains/Wounds Patient Lines/Drains/Airways Status     Active Line/Drains/Airways     Name Placement date Placement time Site Days   Peripheral IV 06/16/23 Anterior;Right Forearm 06/16/23  1806  Forearm  1            Intake/Output Last 24 hours  Intake/Output Summary (Last 24 hours) at 06/17/2023 0850 Last data filed at 06/17/2023 0725 Gross per 24 hour  Intake 1356.92 ml  Output --  Net 1356.92 ml    Labs/Imaging Results for orders placed or performed during the hospital encounter of 06/16/23 (from the past 48 hour(s))  Basic metabolic panel     Status: Abnormal   Collection Time: 06/16/23  3:53 PM  Result Value Ref Range   Sodium 133 (L) 135 - 145 mmol/L   Potassium 2.2 (LL) 3.5 - 5.1 mmol/L    Comment: CRITICAL RESULT CALLED TO, READ BACK BY AND VERIFIED WITH S.SHAW, RN AT 1729 ON 09.25.24 BY N.THOMPSON    Chloride 94 (L) 98 - 111 mmol/L   CO2 25 22 - 32 mmol/L   Glucose, Bld 104 (H) 70 - 99 mg/dL    Comment: Glucose reference range applies only to samples taken after fasting for at least 8 hours.   BUN 14 8 -  23 mg/dL   Creatinine, Ser 1.61 0.61 - 1.24 mg/dL   Calcium 8.5 (L) 8.9 - 10.3 mg/dL   GFR, Estimated >09 >60 mL/min    Comment: (NOTE) Calculated using the CKD-EPI Creatinine Equation (2021)    Anion gap 14 5 - 15    Comment: Performed at [facility], [address], [place], [place] [address]  CBC     Status: Abnormal   Collection Time: 06/16/23  3:53 PM  Result Value Ref Range   WBC 10.3 4.0 - 10.5 K/uL   RBC 3.41 (L) 4.22 - 5.81 MIL/uL   Hemoglobin 10.0 (L) 13.0 - 17.0 g/dL   HCT 81.1 (L) 91.4 - 78.2 %   MCV 90.0 80.0 - 100.0 fL   MCH 29.3 26.0 - 34.0 pg   MCHC 32.6 30.0 - 36.0 g/dL   RDW 95.6 21.3 - 08.6 %   Platelets 141 (L) 150 - 400 K/uL   nRBC 0.0 0.0 - 0.2 %    Comment: Performed at [facility], [address], [place], [place] [address]  Troponin I (High Sensitivity)     Status: Abnormal   Collection Time: 06/16/23  3:53 PM  Result Value Ref Range   Troponin I (High Sensitivity) 20 (H) <18 ng/L    Comment: (NOTE) Elevated high sensitivity troponin I (hsTnI) values and significant  changes across serial measurements may suggest ACS but many other  chronic and acute conditions are known to elevate hsTnI results.  Refer to the "Links" section for chest pain algorithms and additional  guidance. Performed at [facility], [address], [place], [place] [address]   Troponin I (High Sensitivity)     Status: Abnormal   Collection Time: 06/16/23  6:06 PM  Result Value Ref Range   Troponin I (High Sensitivity) 24 (H) <18 ng/L    Comment: (NOTE) Elevated high sensitivity troponin I (hsTnI) values and significant  changes across serial measurements may suggest ACS but many other  chronic and acute conditions are known to elevate hsTnI results.  Refer to the "Links" section for chest pain algorithms and additional  guidance. Performed at [facility], [address], [place], [place] [address]   Magnesium     Status: None   Collection Time: 06/16/23  6:06 PM  Result Value Ref Range   Magnesium 2.0 1.7 - 2.4 mg/dL    Comment: Performed at [facility], [address], [place], [place] [address]  Urinalysis, Routine w reflex microscopic -Urine, Clean Catch     Status: Abnormal   Collection Time: 06/16/23 11:08 PM  Result Value Ref Range   Color, Urine YELLOW YELLOW   APPearance CLEAR CLEAR   Specific Gravity, Urine 1.016 1.005 - 1.030   pH 6.0 5.0 - 8.0   Glucose, UA NEGATIVE NEGATIVE mg/dL   Hgb urine dipstick SMALL (A) NEGATIVE   Bilirubin Urine NEGATIVE NEGATIVE   Ketones, ur 5 (A) NEGATIVE mg/dL   Protein, ur 30 (A) NEGATIVE mg/dL   Nitrite NEGATIVE NEGATIVE   Leukocytes,Ua NEGATIVE  NEGATIVE   RBC / HPF 0-5 0 - 5 RBC/hpf   WBC, UA 0-5 0 - 5 WBC/hpf   Bacteria, UA NONE SEEN NONE SEEN   Squamous Epithelial / HPF 0-5 0 - 5 /HPF    Comment: Performed at [facility], [address] [place], [place] [address]  Phosphorus     Status: None   Collection Time: 06/17/23  6:38 AM  Result Value Ref Range   Phosphorus 2.7 2.5 - 4.6 mg/dL    Comment: Performed at [facility], [address] [place], [place] [address]  CBC     Status: Abnormal   Collection Time: 06/17/23  6:38 AM  Result Value Ref Range   WBC 9.6 4.0 - 10.5 K/uL   RBC 2.94 (L) 4.22 - 5.81 MIL/uL   Hemoglobin 8.7 (L) 13.0 - 17.0 g/dL   HCT 33.2 (L) 95.1 - 88.4 %   MCV 89.5 80.0 - 100.0 fL   MCH 29.6 26.0 - 34.0 pg   MCHC 33.1 30.0 - 36.0 g/dL   RDW 16.6 06.3 - 01.6 %   Platelets 178 150 - 400 K/uL   nRBC 0.0 0.0 - 0.2 %    Comment: Performed at [facility], [address], [place], [place] [address]  Basic metabolic panel     Status: Abnormal   Collection Time: 06/17/23  6:38 AM  Result Value Ref Range   Sodium 134 (L) 135 - 145 mmol/L   Potassium 2.2 (LL) 3.5 - 5.1 mmol/L    Comment: CRITICAL RESULT CALLED TO, READ BACK BY AND VERIFIED WITH  DOWD, P AT 2355 ON 06/17/23 BY F. ALIEFENDIC.    Chloride 98 98 - 111 mmol/L   CO2 26 22 - 32 mmol/L   Glucose, Bld 83 70 - 99 mg/dL    Comment: Glucose reference range applies only to samples taken after fasting for at least 8 hours.   BUN 13 8 - 23 mg/dL   Creatinine, Ser 7.32 0.61 - 1.24 mg/dL   Calcium 8.0 (L) 8.9 - 10.3 mg/dL   GFR, Estimated >20 >25 mL/min    Comment: (NOTE) Calculated using the CKD-EPI Creatinine Equation (2021)    Anion gap 10 5 - 15    Comment: Performed at [facility], [address] [place], [place] [address]  Magnesium     Status: None   Collection Time: 06/17/23  6:38 AM  Result Value Ref Range   Magnesium 1.8 1.7 - 2.4 mg/dL    Comment: Performed  at [facility], [address] [place], [place] [address]  Potassium     Status: Abnormal   Collection Time: 06/17/23  7:40 AM  Result Value Ref Range   Potassium 2.8 (L) 3.5 - 5.1 mmol/L    Comment: Performed at [facility], [address] [place], [place] [address]   MR BRAIN WO CONTRAST  Result Date: 06/16/2023 CLINICAL DATA:  Initial evaluation for neuro deficit, stroke suspected. EXAM: MRI HEAD WITHOUT CONTRAST TECHNIQUE: Multiplanar, multiecho pulse sequences of the brain and surrounding structures were obtained without intravenous contrast. COMPARISON:  None Available. FINDINGS: Brain: Diffuse prominence of the CSF containing spaces compatible generalized cerebral atrophy. Mild chronic microvascular ischemic disease with a few small remote lacunar infarcts about the bilateral basal ganglia. Postoperative changes from prior right sided craniotomy. Chronic cystic encephalomalacia of with gliosis present within the underlying right frontotemporal region. Mild scattered chronic hemosiderin staining present within this location. No abnormal foci of restricted diffusion to suggest acute or subacute ischemia. Gray-white matter differentiation otherwise maintained. No other acute or chronic intracranial blood products. No mass lesion, midline shift or mass effect. Ventricular prominence related to global parenchymal volume loss without hydrocephalus. No extra-axial fluid collection. Pituitary gland suprasellar region within normal limits. Vascular: Major intracranial vascular flow voids are grossly maintained. Skull and upper cervical spine:  Degenerative osteoarthritic changes noted about the skull base. Bone marrow signal intensity within normal limits. No acute scalp soft tissue abnormality. Sinuses/Orbits: Globes and orbital soft tissues within normal limits. Prior bilateral ocular lens replacement. Mild scattered mucosal thickening present about the ethmoidal air  cells. Paranasal sinuses are otherwise largely clear. No mastoid effusion. Other: None. IMPRESSION: 1. No acute intracranial abnormality. 2. Postoperative changes from prior right sided craniotomy with underlying chronic cystic encephalomalacia and gliosis within the right frontotemporal region. 3. Underlying age-related cerebral atrophy with mild chronic small vessel ischemic disease. Electronically Signed   By: Rise Mu M.D.   On: 06/16/2023 20:28   DG Elbow Complete Left  Result Date: 06/16/2023 CLINICAL DATA:  Recurrent falls with left arm pain and swelling EXAM: LEFT ELBOW - COMPLETE 4 VIEW COMPARISON:  None Available. FINDINGS: There is no evidence of fracture, dislocation, or joint effusion. Well corticated ovoid ossification seen only on oblique view, likely degenerative. Diffuse degenerative changes of the elbow. Diffuse soft tissue swelling. IMPRESSION: 1. No acute fracture or dislocation. 2. Diffuse soft tissue swelling. Electronically Signed   By: Agustin Cree M.D.   On: 06/16/2023 17:50    Pending Labs Unresulted Labs (From admission, onward)     Start     Ordered   06/23/23 0500  Creatinine, serum  (enoxaparin (LOVENOX)    CrCl >/= 30 ml/min)  Weekly,   R     Comments: while on enoxaparin therapy    06/16/23 2200   06/18/23 0500  Basic metabolic panel  Daily,   R      06/17/23 0613   06/18/23 0500  CBC  Daily,   R      06/17/23 0613   06/17/23 0130  Basic metabolic panel  Once,   R        06/16/23 2148   06/16/23 2201  Creatinine, serum  (enoxaparin (LOVENOX)    CrCl >/= 30 ml/min)  Once,   R       Comments: Baseline for enoxaparin therapy IF NOT ALREADY DRAWN.    06/16/23 2200   06/16/23 2148  Phenytoin level, total  Once,   R        06/16/23 2148   06/16/23 2147  TSH  Once,   R        06/16/23 2148   06/16/23 2147  Vitamin B12  Once,   R        06/16/23 2148            Vitals/Pain Today's Vitals   06/17/23 0430 06/17/23 0555 06/17/23 0700 06/17/23 0726   BP: (!) 146/89 130/81 (!) 141/82   Pulse:  98 96   Resp: 18 (!) 23 (!) 22   Temp:  99.2 F (37.3 C)    TempSrc:  Axillary    SpO2:  97% 100%   Weight:      Height:      PainSc:    Asleep    Isolation Precautions No active isolations  Medications Medications  0.9 %  sodium chloride infusion (0 mLs Intravenous Stopped 06/17/23 0326)  enoxaparin (LOVENOX) injection 40 mg (has no administration in time range)  acetaminophen (TYLENOL) tablet 650 mg (has no administration in time range)    Or  acetaminophen (TYLENOL) suppository 650 mg (has no administration in time range)  senna-docusate (Senokot-S) tablet 1 tablet (has no administration in time range)  meloxicam (MOBIC) tablet 15 mg (has no administration in time range)  irbesartan (AVAPRO) tablet 300 mg (  has no administration in time range)  pantoprazole (PROTONIX) EC tablet 40 mg (has no administration in time range)  phenytoin (DILANTIN) ER capsule 300 mg (has no administration in time range)  HYDROcodone-acetaminophen (NORCO) 10-325 MG per tablet 1 tablet (has no administration in time range)  gabapentin (NEURONTIN) capsule 100 mg (has no administration in time range)  predniSONE (DELTASONE) tablet 5 mg (has no administration in time range)  ceFAZolin (ANCEF) IVPB 1 g/50 mL premix (0 g Intravenous Stopped 06/17/23 0725)  meclizine (ANTIVERT) tablet 12.5 mg (12.5 mg Oral Given 06/16/23 1659)  potassium chloride 10 mEq in 100 mL IVPB (0 mEq Intravenous Stopped 06/17/23 0317)  potassium chloride (KLOR-CON) packet 40 mEq (40 mEq Oral Given 06/16/23 2017)  potassium chloride (KLOR-CON) packet 20 mEq (20 mEq Oral Given 06/17/23 1610)    Mobility walks     Focused Assessments    R Recommendations: See Admitting Provider Note  Report given to:   Additional Notes:

## 2023-06-17 NOTE — Progress Notes (Signed)
PT Cancellation Note  Patient Details Name: Joseph Rangel MRN: [id number] DOB: [birth year]   Cancelled Treatment:     PT order received but eval deferred this date with pt K+ at critical value 2.3.  Will follow in am.   Sherion Dooly 06/17/2023, 4:47 PM

## 2023-06-17 NOTE — Progress Notes (Signed)
IVT consult received for DBIV. Pt currently has a PIV, no CL access. Reported to the nurse IVT only does blood draws on CL or PIV starts. RN in agreement.

## 2023-06-18 DIAGNOSIS — E871 Hypo-osmolality and hyponatremia: Secondary | ICD-10-CM | POA: Diagnosis not present

## 2023-06-18 DIAGNOSIS — R531 Weakness: Secondary | ICD-10-CM | POA: Diagnosis not present

## 2023-06-18 DIAGNOSIS — E876 Hypokalemia: Secondary | ICD-10-CM | POA: Diagnosis not present

## 2023-06-18 DIAGNOSIS — R9431 Abnormal electrocardiogram [ECG] [EKG]: Secondary | ICD-10-CM | POA: Diagnosis not present

## 2023-06-18 DIAGNOSIS — D649 Anemia, unspecified: Secondary | ICD-10-CM

## 2023-06-18 LAB — BASIC METABOLIC PANEL
Anion gap: 6 (ref 5–15)
BUN: 16 mg/dL (ref 8–23)
CO2: 25 mmol/L (ref 22–32)
Calcium: 7.7 mg/dL — ABNORMAL LOW (ref 8.9–10.3)
Chloride: 101 mmol/L (ref 98–111)
Creatinine, Ser: 1.08 mg/dL (ref 0.61–1.24)
GFR, Estimated: >60 mL/min (ref >60)
Glucose, Bld: 111 mg/dL — ABNORMAL HIGH (ref 70–99)
Potassium: 3.1 mmol/L — ABNORMAL LOW (ref 3.5–5.1)
Sodium: 132 mmol/L — ABNORMAL LOW (ref 135–145)

## 2023-06-18 LAB — OSMOLALITY, URINE: Osmolality, Ur: 573 mosm/kg (ref 300–900)

## 2023-06-18 LAB — CBC
HCT: 25.2 % — ABNORMAL LOW (ref 39.0–52.0)
Hemoglobin: 8.2 g/dL — ABNORMAL LOW (ref 13.0–17.0)
MCH: 29.7 pg (ref 26.0–34.0)
MCHC: 32.5 g/dL (ref 30.0–36.0)
MCV: 91.3 fL (ref 80.0–100.0)
Platelets: 177 10*3/uL (ref 150–400)
RBC: 2.76 MIL/uL — ABNORMAL LOW (ref 4.22–5.81)
RDW: 13 % (ref 11.5–15.5)
WBC: 8 10*3/uL (ref 4.0–10.5)
nRBC: 0 % (ref 0.0–0.2)

## 2023-06-18 LAB — OSMOLALITY: Osmolality: 291 mosm/kg (ref 275–295)

## 2023-06-18 LAB — SODIUM, URINE, RANDOM: Sodium, Ur: 39 mmol/L

## 2023-06-18 MED ORDER — POTASSIUM CHLORIDE CRYS ER 20 MEQ PO TBCR
40.0000 meq | EXTENDED_RELEASE_TABLET | Freq: Once | ORAL | Status: AC
  Administered 2023-06-18: 40 meq via ORAL
  Filled 2023-06-18: qty 2

## 2023-06-18 NOTE — Plan of Care (Signed)
  Problem: Safety: Goal: Ability to remain free from injury will improve Outcome: Progressing   

## 2023-06-18 NOTE — Evaluation (Signed)
Physical Therapy Evaluation Patient Details Name: Joseph Rangel MRN: [id number] DOB: 1945/10/28 Today's Date: 06/18/2023  History of Present Illness  77 year old male with HTN, HLP, GERD, seizure disorder, metastatic prostate CA with spread to intra-abdominal lymph nodes on daily Zytiga, prednisone, Lupron every 3 months, B12 deficiency, chronic hypokalemia on oral potassium presented with dizziness and falling episodes at home.  Pt admitted 06/16/23 for severe hypokalemia.  Per patient, he normally uses a cane and a walker at home at baseline however has been having issues with gait instability for the past week, fallen x 2 at home.  Left elbow x-ray negative for any fracture or dislocation  Clinical Impression  Pt admitted with above diagnosis.  Pt currently with functional limitations due to the deficits listed below (see PT Problem List). Pt will benefit from acute skilled PT to increase their independence and safety with mobility to allow discharge.   Pt a little reluctant to mobilize but more agreeable with RN into room.  Pt assisted OOB to recliner and required mod assist for safety and due to reported right thigh pain.  Pt with multiple falls prior to admission.  Pt reports living with spouse but having numerous children that could assist him upon d/c.  If family cannot provide physical assist, patient may benefit from continued inpatient follow up therapy, <3 hours/day.          If plan is discharge home, recommend the following: A lot of help with walking and/or transfers;A lot of help with bathing/dressing/bathroom;Help with stairs or ramp for entrance;Assistance with cooking/housework   Can travel by private vehicle   No    Equipment Recommendations Other (comment) (may need w/c if home, pending progress)  Recommendations for Other Services       Functional Status Assessment Patient has had a recent decline in their functional status and demonstrates the ability to make significant  improvements in function in a reasonable and predictable amount of time.     Precautions / Restrictions Precautions Precautions: Fall Restrictions Weight Bearing Restrictions: No      Mobility  Bed Mobility Overal bed mobility: Needs Assistance Bed Mobility: Supine to Sit     Supine to sit: Mod assist     General bed mobility comments: assist for trunk upright and scooting to EOB, pt very tender with Left elbow but able to move UE to self assist (left arm also more swollen)    Transfers Overall transfer level: Needs assistance Equipment used: Rolling walker (2 wheels) Transfers: Sit to/from Stand, Bed to chair/wheelchair/BSC Sit to Stand: Mod assist, +2 safety/equipment Stand pivot transfers: Mod assist, +2 safety/equipment         General transfer comment: pt required cues for hand placement and assist to rise and steady, pt reports 10/10 right thigh pain and not able to take steps or weight bear well through R leg so cues provided to pivot    Ambulation/Gait                  Stairs            Wheelchair Mobility     Tilt Bed    Modified Rankin (Stroke Patients Only)       Balance Overall balance assessment: Needs assistance         Standing balance support: Bilateral upper extremity supported, During functional activity, Reliant on assistive device for balance Standing balance-Leahy Scale: Poor  Pertinent Vitals/Pain Pain Assessment Pain Assessment: 0-10 Pain Score: 10-Worst pain ever Pain Location: right thigh Pain Descriptors / Indicators: Sore, Tender, Guarding Pain Intervention(s): Repositioned, Monitored during session, RN gave pain meds during session (RN aware of pain, pain meds given end of session)    Home Living Family/patient expects to be discharged to:: Private residence Living Arrangements: Spouse/significant other Available Help at Discharge: Family;Available 24 hours/day            Home Layout: One level Home Equipment: Agricultural consultant (2 wheels);Cane - single point Additional Comments: pt appeared reluctant to answer questions but stated above with RN present in room, RN also reports multiple family member present yesterday - pt states his has numerous children and they could assist him at home if needed    Prior Function Prior Level of Function : Independent/Modified Independent             Mobility Comments: uses SPC in home, RW for community       Extremity/Trunk Assessment        Lower Extremity Assessment Lower Extremity Assessment: Generalized weakness;RLE deficits/detail RLE Deficits / Details: reports pain in right thigh with movement and weight bearing, RN present and aware (pt with multiple falls prior to admission)       Communication   Communication Communication: No apparent difficulties  Cognition Arousal: Alert Behavior During Therapy: Flat affect Overall Cognitive Status: Within Functional Limits for tasks assessed                                          General Comments      Exercises     Assessment/Plan    PT Assessment Patient needs continued PT services  PT Problem List Decreased mobility;Decreased activity tolerance;Decreased strength;Pain;Decreased knowledge of use of DME;Decreased balance       PT Treatment Interventions DME instruction;Gait training;Balance training;Functional mobility training;Therapeutic activities;Therapeutic exercise;Patient/family education    PT Goals (Current goals can be found in the Care Plan section)  Acute Rehab PT Goals PT Goal Formulation: With patient Time For Goal Achievement: 07/02/23 Potential to Achieve Goals: Good    Frequency Min 1X/week     Co-evaluation               AM-PAC PT "6 Clicks" Mobility  Outcome Measure Help needed turning from your back to your side while in a flat bed without using bedrails?: A Lot Help needed moving from  lying on your back to sitting on the side of a flat bed without using bedrails?: A Lot Help needed moving to and from a bed to a chair (including a wheelchair)?: A Lot Help needed standing up from a chair using your arms (e.g., wheelchair or bedside chair)?: A Lot Help needed to walk in hospital room?: A Lot Help needed climbing 3-5 steps with a railing? : A Lot 6 Click Score: 12    End of Session Equipment Utilized During Treatment: Gait belt Activity Tolerance: Patient limited by pain Patient left: with call bell/phone within reach;in chair;with chair alarm set Nurse Communication: Mobility status PT Visit Diagnosis: Difficulty in walking, not elsewhere classified (R26.2)    Time: 4401-0272 PT Time Calculation (min) (ACUTE ONLY): 17 min   Charges:   PT Evaluation $PT Eval Low Complexity: 1 Low   PT General Charges $$ ACUTE PT VISIT: 1 Visit       Thomasene Mohair PT, DPT  Physical Therapist Acute Rehabilitation Services Office: [phone]   Janan Halter [place] 06/18/2023, 1:21 PM

## 2023-06-18 NOTE — Progress Notes (Signed)
[facility]                                                                              Joseph Rangel, is a 77 y.o. male, DOB - 04-07-1946, [id number] Admit date - 06/16/2023    Outpatient Primary MD for the patient is Joseph Mires, MD  LOS - 1  days  Chief Complaint  Patient presents with   Weakness   Fall   Arm Injury       Brief summary   Patient is a 77 year old male with HTN, HLP, GERD, seizure disorder, metastatic prostate CA with spread to intra-abdominal lymph nodes on daily Zytiga, prednisone, Lupron every 3 months, B12 deficiency, chronic hypokalemia on oral potassium presented with dizziness and falling episodes at home.  Per patient, he normally uses a cane and a walker at home at baseline however has been having issues with gait instability for the past week, fallen x 2 at home.  Recently fell a day before the admission on his left side with pain around his left elbow.  He also reported polyuria but no dysuria. In ED, mild tachycardia but no hypoxia.  Mild hyponatremia, severe hypokalemia 2.2.  EKG showed prolonged QTc. Left elbow x-ray diffuse soft tissue swelling, no fracture or dislocation MRI brain showed no acute findings, postop changes from prior right-sided craniotomy, age-related atrophy. Patient was admitted for further workup.   Assessment & Plan    Principal Problem: Severe hypokalemia -Potassium 2.2 on admission, aggressively replaced IV and PO  -Noted patient is on hydrochlorothiazide at home, will need to discontinue it at discharge - K 3.1, improving, replaced 40 meq p.o. x 1   Active Problems: Prolonged QTc -QTc on admission 530, improved to  QTc 467 -Avoid QT prolonging drugs - keep K ~4, Mag ~2    Generalized weakness   Fall at home -MRI brain showed no acute stroke, likely in the setting of hyponatremia, hypokalemia -B12 within normal limits, TSH 1.2, replace electrolytes.  -PT evaluation  pending  Hyponatremia -Stable, asymptomatic - Na improving, 132  Essential hypertension -Continue atenolol, irbesartan -Discontinue HCTZ at discharge    Prostate cancer metastatic to intraabdominal lymph node (HCC) -On Zytiga, prednisone, Lupron every 3 months -Follows Dr. Al Rangel, last seen in 02/2023  Chronic normocytic normochromic anemia -Currently close to baseline, no obvious bleeding   Seizure disorder -Continue Dilantin  GERD Continue PPI  Left elbow pain -Status post fall, x-ray negative for any fracture or dislocation.  Estimated body mass index is 23.48 kg/m as calculated from the following:   Height as of this encounter: 5\' 9"  (1.753 m).   Weight as of this encounter: 72.1 kg.  Code Status: Full code DVT Prophylaxis:  enoxaparin (LOVENOX) injection 40 mg Start: 06/17/23 1000 SCDs Start: 06/16/23 2201   Level of Care: Level of care: Telemetry Family Communication: Updated patient's daughter Joseph Rangel on the phone Disposition Plan:      Remains inpatient appropriate: PT OT evaluation pending, possible DC tomorrow   Procedures:  None  Consultants:   None  Antimicrobials:   Anti-infectives (From admission, onward)    Start  Dose/Rate Route Frequency Ordered Stop   06/16/23 2215  ceFAZolin (ANCEF) IVPB 1 g/50 mL premix        1 g 100 mL/hr over 30 Minutes Intravenous Every 8 hours 06/16/23 2204 06/23/23 2159          Medications  atenolol  25 mg Oral Daily   enoxaparin (LOVENOX) injection  40 mg Subcutaneous Q24H   irbesartan  300 mg Oral Daily   pantoprazole  40 mg Oral Daily   phenytoin  300 mg Oral Daily   predniSONE  5 mg Oral Q breakfast      Subjective:   Joseph Rangel was seen and examined today.  No acute complaints, eating breakfast at time of my encounter.  Generalized weakness but no nausea vomiting or diarrhea.  Feeling better.  Objective:   Vitals:   06/17/23 1401 06/17/23 1800 06/17/23 2102 06/18/23 0514  BP: (!)  93/54 (!) 134/90 137/83 (!) 148/75  Pulse: 75 80 89 87  Resp: 20  20 (!) 22  Temp: 97.8 F (36.6 C) 98.7 F (37.1 C) 99.6 F (37.6 C) 98.7 F (37.1 C)  TempSrc: Oral Oral Oral Oral  SpO2: 100% 100% 100% 94%  Weight:      Height:        Intake/Output Summary (Last 24 hours) at 06/18/2023 1249 Last data filed at 06/18/2023 8657 Gross per 24 hour  Intake 416.7 ml  Output 650 ml  Net -233.3 ml     Wt Readings from Last 3 Encounters:  06/16/23 72.1 kg  02/25/23 73.2 kg  12/03/22 75.4 kg   Physical Exam General: Alert and oriented x 3, NAD Cardiovascular: S1 S2 clear, RRR.  Respiratory: CTAB, no wheezing Gastrointestinal: Soft, nontender, nondistended, NBS Ext: no pedal edema bilaterally Neuro: no new deficits Psych: Normal affect     Data Reviewed:  I have personally reviewed following labs    CBC Lab Results  Component Value Date   WBC 8.0 06/18/2023   RBC 2.76 (L) 06/18/2023   HGB 8.2 (L) 06/18/2023   HCT 25.2 (L) 06/18/2023   MCV 91.3 06/18/2023   MCH 29.7 06/18/2023   PLT 177 06/18/2023   MCHC 32.5 06/18/2023   RDW 13.0 06/18/2023   LYMPHSABS 2.0 02/25/2023   MONOABS 0.7 02/25/2023   EOSABS 0.2 02/25/2023   BASOSABS 0.1 02/25/2023     Last metabolic panel Lab Results  Component Value Date   NA 132 (L) 06/18/2023   K 3.1 (L) 06/18/2023   CL 101 06/18/2023   CO2 25 06/18/2023   BUN 16 06/18/2023   CREATININE 1.08 06/18/2023   GLUCOSE 111 (H) 06/18/2023   GFRNONAA >60 06/18/2023   GFRAA >90 02/25/2012   CALCIUM 7.7 (L) 06/18/2023   PHOS 2.7 06/17/2023   PROT 7.6 02/25/2023   ALBUMIN 4.2 02/25/2023   LABGLOB 3.4 08/09/2020   AGRATIO 1.1 08/09/2020   BILITOT 0.4 02/25/2023   ALKPHOS 166 (H) 02/25/2023   AST 11 (L) 02/25/2023   ALT 5 02/25/2023   ANIONGAP 6 06/18/2023    CBG (last 3)  No results for input(s): "GLUCAP" in the last 72 hours.    Coagulation Profile: No results for input(s): "INR", "PROTIME" in the last 168  hours.   Radiology Studies: I have personally reviewed the imaging studies  MR BRAIN WO CONTRAST  Result Date: 06/16/2023 CLINICAL DATA:  Initial evaluation for neuro deficit, stroke suspected. EXAM: MRI HEAD WITHOUT CONTRAST TECHNIQUE: Multiplanar, multiecho pulse sequences of the brain and surrounding structures  were obtained without intravenous contrast. COMPARISON:  None Available. FINDINGS: Brain: Diffuse prominence of the CSF containing spaces compatible generalized cerebral atrophy. Mild chronic microvascular ischemic disease with a few small remote lacunar infarcts about the bilateral basal ganglia. Postoperative changes from prior right sided craniotomy. Chronic cystic encephalomalacia of with gliosis present within the underlying right frontotemporal region. Mild scattered chronic hemosiderin staining present within this location. No abnormal foci of restricted diffusion to suggest acute or subacute ischemia. Gray-white matter differentiation otherwise maintained. No other acute or chronic intracranial blood products. No mass lesion, midline shift or mass effect. Ventricular prominence related to global parenchymal volume loss without hydrocephalus. No extra-axial fluid collection. Pituitary gland suprasellar region within normal limits. Vascular: Major intracranial vascular flow voids are grossly maintained. Skull and upper cervical spine: Degenerative osteoarthritic changes noted about the skull base. Bone marrow signal intensity within normal limits. No acute scalp soft tissue abnormality. Sinuses/Orbits: Globes and orbital soft tissues within normal limits. Prior bilateral ocular lens replacement. Mild scattered mucosal thickening present about the ethmoidal air cells. Paranasal sinuses are otherwise largely clear. No mastoid effusion. Other: None. IMPRESSION: 1. No acute intracranial abnormality. 2. Postoperative changes from prior right sided craniotomy with underlying chronic cystic  encephalomalacia and gliosis within the right frontotemporal region. 3. Underlying age-related cerebral atrophy with mild chronic small vessel ischemic disease. Electronically Signed   By: Rise Mu M.D.   On: 06/16/2023 20:28   DG Elbow Complete Left  Result Date: 06/16/2023 CLINICAL DATA:  Recurrent falls with left arm pain and swelling EXAM: LEFT ELBOW - COMPLETE 4 VIEW COMPARISON:  None Available. FINDINGS: There is no evidence of fracture, dislocation, or joint effusion. Well corticated ovoid ossification seen only on oblique view, likely degenerative. Diffuse degenerative changes of the elbow. Diffuse soft tissue swelling. IMPRESSION: 1. No acute fracture or dislocation. 2. Diffuse soft tissue swelling. Electronically Signed   By: Agustin Cree M.D.   On: 06/16/2023 17:50       Desha Bitner M.D. [facility] 06/18/2023, 12:49 PM  Available via Epic secure chat 7am-7pm After 7 pm, please refer to night coverage provider listed on amion.

## 2023-06-18 NOTE — Evaluation (Signed)
Occupational Therapy Evaluation Patient Details Name: Joseph Rangel MRN: [id number] DOB: August 23, 1946 Today's Date: 06/18/2023   History of Present Illness 77 year old male with HTN, HLP, GERD, seizure disorder, metastatic prostate CA with spread to intra-abdominal lymph nodes on daily Zytiga, prednisone, Lupron every 3 months, B12 deficiency, chronic hypokalemia on oral potassium presented with dizziness and falling episodes at home.  Pt admitted 06/16/23 for severe hypokalemia.  Per patient, he normally uses a cane and a walker at home at baseline however has been having issues with gait instability for the past week, fallen x 2 at home.  Left elbow x-ray negative for any fracture or dislocation   Clinical Impression   Pt is currently limitd by the below listed deficits which compromise his ADL performance and functional independence (see OT problem list). At current, he requires increased assist for bed mobility, dressing, and simulated toileting. Today, he presented with decreased activity tolerance, given reports of significant R thigh and LUE pain (LUE edema noted).  He also appeared to be with general deconditioning and weakness. He will benefit from further OT services to maximize his ADL performance and to decrease the risk for further weakness and deconditioning. He expressed a desire to return home with home therapy at discharge, rather than receiving short term SNF rehab.       If plan is discharge home, recommend the following: A lot of help with walking and/or transfers;A lot of help with bathing/dressing/bathroom;Assistance with cooking/housework;Assist for transportation;Direct supervision/assist for medications management    Functional Status Assessment  Patient has had a recent decline in their functional status and demonstrates the ability to make significant improvements in function in a reasonable and predictable amount of time.  Equipment Recommendations  Other (comment)     Recommendations for Other Services       Precautions / Restrictions Precautions Precautions: Fall Restrictions Weight Bearing Restrictions: No      Mobility Bed Mobility Overal bed mobility: Needs Assistance Bed Mobility: Rolling Rolling: Mod assist, Max assist         General bed mobility comments: pt presented with decreased ability to perform full rolls left & right, given reports of R thigh and LUE pain            ADL either performed or assessed with clinical judgement   ADL Overall ADL's : Needs assistance/impaired Eating/Feeding: Set up;Bed level   Grooming: Minimal assistance;Bed level           Upper Body Dressing : Moderate assistance;Bed level   Lower Body Dressing: Maximal assistance;Bed level       Toileting- Clothing Manipulation and Hygiene: Maximal assistance;Bed level Toileting - Clothing Manipulation Details (indicate cue type and reason): based on clinical judgement                          Pertinent Vitals/Pain Pain Assessment Pain Assessment: Faces Pain Score: 6  Pain Location: R thigh and LUE Pain Intervention(s): Limited activity within patient's tolerance, Monitored during session, Repositioned     Extremity/Trunk Assessment Upper Extremity Assessment Upper Extremity Assessment: LUE deficits/detail;RUE deficits/detail RUE Deficits / Details: pt reported acute soreness. functional grip strength. pt unable to perform full AROM for shoulder flexion LUE Deficits / Details: edema noted. pt reported acute pain with difficulty performing elbow ROM. functional grip strength   Lower Extremity Assessment Lower Extremity Assessment: LLE deficits/detail RLE Deficits / Details: reports pain in right thigh with movement and weight bearing, RN present and aware (pt  with multiple falls prior to admission) (per PT eval) LLE Deficits / Details: AROM WFL       Communication Communication Communication: No apparent difficulties    Cognition Arousal: Alert Behavior During Therapy: Flat affect Overall Cognitive Status: Within Functional Limits for tasks assessed          General Comments: oriented to person, place, and year; disoriented to month. able to follow 1 step commands consistently                Home Living Family/patient expects to be discharged to:: Private residence Living Arrangements: Spouse/significant other and niece Available Help at Discharge: Family Type of Home: House             Bathroom Shower/Tub: Tub/shower unit         Home Equipment: Agricultural consultant (2 wheels);Cane - single point          Prior Functioning/Environment               Mobility Comments:  (he used a cane inside and RW outside) ADLs Comments: he reported needing occasional assist from his spouse for bathing and dressing; his family managed cooking and cleaning.        OT Problem List: Decreased strength;Decreased range of motion;Decreased activity tolerance;Impaired balance (sitting and/or standing);Impaired UE functional use;Pain;Increased edema      OT Treatment/Interventions: Self-care/ADL training;Therapeutic exercise;Energy conservation;DME and/or AE instruction;Therapeutic activities;Balance training;Patient/family education    OT Goals(Current goals can be found in the care plan section) Acute Rehab OT Goals Patient Stated Goal: to get better OT Goal Formulation: With patient Time For Goal Achievement: 07/02/23 Potential to Achieve Goals: Good ADL Goals Pt Will Perform Grooming: with set-up;sitting Pt Will Perform Upper Body Dressing: with set-up;sitting Pt Will Perform Lower Body Dressing: with contact guard assist;sit to/from stand Pt Will Transfer to Toilet: with contact guard assist;ambulating Pt Will Perform Toileting - Clothing Manipulation and hygiene: with contact guard assist;sit to/from stand  OT Frequency: Min 1X/week       AM-PAC OT "6 Clicks" Daily Activity      Outcome Measure Help from another person eating meals?: A Little Help from another person taking care of personal grooming?: A Little Help from another person toileting, which includes using toliet, bedpan, or urinal?: A Lot Help from another person bathing (including washing, rinsing, drying)?: A Lot Help from another person to put on and taking off regular upper body clothing?: A Lot Help from another person to put on and taking off regular lower body clothing?: A Lot 6 Click Score: 14   End of Session Equipment Utilized During Treatment: Other (comment) (N/A) Nurse Communication: Mobility status  Activity Tolerance: Patient limited by pain Patient left: in bed;with call bell/phone within reach;with chair alarm set  OT Visit Diagnosis: Pain;Muscle weakness (generalized) (M62.81);History of falling (Z91.81) Pain - Right/Left: Right (thigh and LUE)                Time: 1610-9604 OT Time Calculation (min): 20 min Charges:  OT General Charges $OT Visit: 1 Visit OT Evaluation $OT Eval Moderate Complexity: 1 Mod    Allyson Tineo L Sharnay Cashion, OTR/L 06/18/2023, 5:27 PM

## 2023-06-19 ENCOUNTER — Inpatient Hospital Stay (HOSPITAL_COMMUNITY): Payer: Medicare PPO

## 2023-06-19 DIAGNOSIS — M7989 Other specified soft tissue disorders: Secondary | ICD-10-CM | POA: Diagnosis not present

## 2023-06-19 DIAGNOSIS — E876 Hypokalemia: Secondary | ICD-10-CM | POA: Diagnosis not present

## 2023-06-19 LAB — CBC
HCT: 26.5 % — ABNORMAL LOW (ref 39.0–52.0)
Hemoglobin: 8.4 g/dL — ABNORMAL LOW (ref 13.0–17.0)
MCH: 29.8 pg (ref 26.0–34.0)
MCHC: 31.7 g/dL (ref 30.0–36.0)
MCV: 94 fL (ref 80.0–100.0)
Platelets: 210 10*3/uL (ref 150–400)
RBC: 2.82 MIL/uL — ABNORMAL LOW (ref 4.22–5.81)
RDW: 12.9 % (ref 11.5–15.5)
WBC: 7.5 10*3/uL (ref 4.0–10.5)
nRBC: 0 % (ref 0.0–0.2)

## 2023-06-19 LAB — CK: Total CK: 118 U/L (ref 49–397)

## 2023-06-19 LAB — BASIC METABOLIC PANEL
Anion gap: 9 (ref 5–15)
BUN: 15 mg/dL (ref 8–23)
CO2: 23 mmol/L (ref 22–32)
Calcium: 7.9 mg/dL — ABNORMAL LOW (ref 8.9–10.3)
Chloride: 101 mmol/L (ref 98–111)
Creatinine, Ser: 0.98 mg/dL (ref 0.61–1.24)
GFR, Estimated: >60 mL/min (ref >60)
Glucose, Bld: 100 mg/dL — ABNORMAL HIGH (ref 70–99)
Potassium: 3.2 mmol/L — ABNORMAL LOW (ref 3.5–5.1)
Sodium: 133 mmol/L — ABNORMAL LOW (ref 135–145)

## 2023-06-19 LAB — MAGNESIUM: Magnesium: 2 mg/dL (ref 1.7–2.4)

## 2023-06-19 MED ORDER — VANCOMYCIN HCL 1500 MG/300ML IV SOLN
1500.0000 mg | INTRAVENOUS | Status: DC
  Administered 2023-06-20 – 2023-06-21 (×2): 1500 mg via INTRAVENOUS
  Filled 2023-06-19 (×3): qty 300

## 2023-06-19 MED ORDER — POTASSIUM CHLORIDE CRYS ER 20 MEQ PO TBCR
40.0000 meq | EXTENDED_RELEASE_TABLET | Freq: Once | ORAL | Status: AC
  Administered 2023-06-19: 40 meq via ORAL
  Filled 2023-06-19: qty 2

## 2023-06-19 MED ORDER — SODIUM CHLORIDE 0.9 % IV SOLN
2.0000 g | Freq: Once | INTRAVENOUS | Status: AC
  Administered 2023-06-19: 2 g via INTRAVENOUS
  Filled 2023-06-19: qty 12.5

## 2023-06-19 MED ORDER — SODIUM CHLORIDE 0.9 % IV SOLN
2.0000 g | Freq: Three times a day (TID) | INTRAVENOUS | Status: DC
  Administered 2023-06-19 – 2023-06-22 (×7): 2 g via INTRAVENOUS
  Filled 2023-06-19 (×8): qty 12.5

## 2023-06-19 MED ORDER — HYDRALAZINE HCL 20 MG/ML IJ SOLN
5.0000 mg | Freq: Once | INTRAMUSCULAR | Status: AC
  Administered 2023-06-19: 5 mg via INTRAVENOUS
  Filled 2023-06-19: qty 1

## 2023-06-19 MED ORDER — KETOROLAC TROMETHAMINE 15 MG/ML IJ SOLN
15.0000 mg | Freq: Once | INTRAMUSCULAR | Status: AC
  Administered 2023-06-19: 15 mg via INTRAVENOUS
  Filled 2023-06-19: qty 1

## 2023-06-19 MED ORDER — VANCOMYCIN HCL 1500 MG/300ML IV SOLN
1500.0000 mg | Freq: Once | INTRAVENOUS | Status: AC
  Administered 2023-06-19: 1500 mg via INTRAVENOUS
  Filled 2023-06-19: qty 300

## 2023-06-19 MED ORDER — VITAMIN B-12 1000 MCG PO TABS
1000.0000 ug | ORAL_TABLET | Freq: Every day | ORAL | Status: DC
  Administered 2023-06-19 – 2023-06-22 (×4): 1000 ug via ORAL
  Filled 2023-06-19 (×4): qty 1

## 2023-06-19 NOTE — Progress Notes (Addendum)
[facility]                                                                              Joseph Rangel, is a 77 y.o. male, DOB - 1946/03/12, [id number]:[id number] Admit date - 06/16/2023    Outpatient Primary MD for the patient is [name], MD  LOS - 2  days  Chief Complaint  Patient presents with   Weakness   Fall   Arm Injury       Brief summary   Patient is a 77 year old male with HTN, HLP, GERD, seizure disorder, metastatic prostate CA with spread to intra-abdominal lymph nodes on daily Zytiga, prednisone, Lupron every 3 months, B12 deficiency, chronic hypokalemia on oral potassium presented with dizziness and falling episodes at home.  Per patient, he normally uses a cane and a walker at home at baseline however has been having issues with gait instability for the past week, fallen x 2 at home.  Recently fell a day before the admission on his left side with pain around his left elbow.  Left elbow x-ray diffuse soft tissue swelling, no fracture or dislocation but diffuse swelling MRI brain showed no acute findings, postop changes from prior right-sided craniotomy, age-related atrophy.    Assessment & Plan    Severe hypokalemia -Potassium 2.2 on admission, aggressively replaced IV and PO  -Noted patient is on hydrochlorothiazide at home, will need to discontinue it at discharge - replete again this AM  Prolonged QTc -QTc on admission 530, improved to  QTc 467 -Avoid QT prolonging drugs - keep K ~4, Mag ~2    Generalized weakness   Fall at home -MRI brain showed no acute stroke, likely in the setting of hyponatremia, hypokalemia -B12 lower limits of normal, TSH 1.2, replace electrolytes.  -PT evaluation-- SNF-- TOC consulted  Hyponatremia -Stable, asymptomatic   Essential hypertension -Continue atenolol, irbesartan -Discontinue HCTZ at discharge    Prostate cancer metastatic to intraabdominal lymph node (HCC) -On Zytiga, prednisone, Lupron every 3  months -Follows Dr. Al Pimple, last seen in 02/2023  Chronic normocytic normochromic anemia -Currently close to baseline, no obvious bleeding  Seizure disorder -Continue Dilantin  GERD Continue PPI  Left elbow pain/? cellulitis -Status post fall, x-ray negative for any fracture or dislocation. -swelling -will check duplex -was started on ancef upon admission -RN To measure size of arm  obesity Estimated body mass index is 23.48 kg/m as calculated from the following:   Height as of this encounter: 5\' 9"  (1.753 m).   Weight as of this encounter: 72.1 kg.   Code Status: Full code DVT Prophylaxis:  enoxaparin (LOVENOX) injection 40 mg Start: 06/17/23 1000 SCDs Start: 06/16/23 2201 updated wife on phone  Level of Care: Level of care: Telemetry Disposition Plan:      PT recommends SNF      Medications  atenolol  25 mg Oral Daily   vitamin B-12  1,000 mcg Oral Daily   enoxaparin (LOVENOX) injection  40 mg Subcutaneous Q24H   irbesartan  300 mg Oral Daily   pantoprazole  40 mg Oral Daily   phenytoin  300 mg Oral Daily   predniSONE  5 mg Oral Q breakfast      Subjective:   Continues to complain of pain in left elbow  Objective:   Vitals:   06/18/23 0514 06/18/23 1251 06/18/23 2012 06/19/23 0400  BP: (!) 148/75 119/63 (!) 158/89 (!) 175/88  Pulse: 87 87 89 90  Resp: (!) 22 (!) 24 18 20   Temp: 98.7 F (37.1 C) 98.7 F (37.1 C) 99.2 F (37.3 C) 99.5 F (37.5 C)  TempSrc: Oral Oral Oral Oral  SpO2: 94% 99% 99% 97%  Weight:      Height:        Intake/Output Summary (Last 24 hours) at 06/19/2023 0865 Last data filed at 06/19/2023 0500 Gross per 24 hour  Intake 43.49 ml  Output 600 ml  Net -556.51 ml     Wt Readings from Last 3 Encounters:  06/16/23 72.1 kg  02/25/23 73.2 kg  12/03/22 75.4 kg   Physical Exam  General: Appearance:    Well developed, well nourished male in no acute distress   Left elbow swollen and warm  Lungs:     respirations  unlabored  Heart:    Normal heart rate.    MS:   All extremities are intact.   Neurologic:   Awake, alert      Data Reviewed:  I have personally reviewed following labs    CBC Lab Results  Component Value Date   WBC 7.5 06/19/2023   RBC 2.82 (L) 06/19/2023   HGB 8.4 (L) 06/19/2023   HCT 26.5 (L) 06/19/2023   MCV 94.0 06/19/2023   MCH 29.8 06/19/2023   PLT 210 06/19/2023   MCHC 31.7 06/19/2023   RDW 12.9 06/19/2023   LYMPHSABS 2.0 02/25/2023   MONOABS 0.7 02/25/2023   EOSABS 0.2 02/25/2023   BASOSABS 0.1 02/25/2023     Last metabolic panel Lab Results  Component Value Date   NA 133 (L) 06/19/2023   K 3.2 (L) 06/19/2023   CL 101 06/19/2023   CO2 23 06/19/2023   BUN 15 06/19/2023   CREATININE 0.98 06/19/2023   GLUCOSE 100 (H) 06/19/2023   GFRNONAA >60 06/19/2023   GFRAA >90 02/25/2012   CALCIUM 7.9 (L) 06/19/2023   PHOS 2.7 06/17/2023   PROT 7.6 02/25/2023   ALBUMIN 4.2 02/25/2023   LABGLOB 3.4 08/09/2020   AGRATIO 1.1 08/09/2020   BILITOT 0.4 02/25/2023   ALKPHOS 166 (H) 02/25/2023   AST 11 (L) 02/25/2023   ALT 5 02/25/2023   ANIONGAP 9 06/19/2023    CBG (last 3)  No results for input(s): "GLUCAP" in the last 72 hours.    Coagulation Profile: No results for input(s): "INR", "PROTIME" in the last 168 hours.   Radiology Studies: I have personally reviewed the imaging studies  No results found.     Joseph Art DO [facility] 06/19/2023, 8:37 AM  Available via Epic secure chat 7am-7pm After 7 pm, please refer to night coverage provider listed on amion.

## 2023-06-19 NOTE — Plan of Care (Signed)
  Problem: Pain Managment: Goal: General experience of comfort will improve Outcome: Progressing   Problem: Safety: Goal: Ability to remain free from injury will improve Outcome: Progressing   Problem: Skin Integrity: Goal: Risk for impaired skin integrity will decrease Outcome: Progressing   

## 2023-06-19 NOTE — Progress Notes (Signed)
Upper extremity venous duplex completed. Please see CV Procedures for preliminary results.  Shona Simpson, RVT 06/19/23 10:13 AM

## 2023-06-19 NOTE — Progress Notes (Signed)
Occupational Therapy Treatment Patient Details Name: Joseph Rangel MRN: [id number] DOB: 1945-12-25 Today's Date: 06/19/2023   History of present illness 77 year old male with HTN, HLP, GERD, seizure disorder, metastatic prostate CA with spread to intra-abdominal lymph nodes on daily Zytiga, prednisone, Lupron every 3 months, B12 deficiency, chronic hypokalemia on oral potassium presented with dizziness and falling episodes at home.  Pt admitted 06/16/23 for severe hypokalemia.  Per patient, he normally uses a cane and a walker at home at baseline however has been having issues with gait instability for the past week, fallen x 2 at home.  Left elbow x-ray negative for any fracture or dislocation   OT comments  Nurse sent message reporting patient was having increased difficulty with self feeding asking therapy for any assist. Patient was provided with red foam handles and patient was able to scoop and spear mashed potatoes and get a few bites to mouth with education provided to family to let patient try to maintain independence and then offer help with feeding when he fatigued. Patient and family verbalized understanding. Patient was +2 for transfer with mod A and RW to increase independence in ADLs. Patient will benefit from continued inpatient follow up therapy, <3 hours/day.        If plan is discharge home, recommend the following:  A lot of help with walking and/or transfers;A lot of help with bathing/dressing/bathroom;Assistance with cooking/housework;Assist for transportation;Direct supervision/assist for medications management   Equipment Recommendations  None recommended by OT       Precautions / Restrictions Precautions Precautions: Fall Restrictions Weight Bearing Restrictions: No       Mobility Bed Mobility Overal bed mobility: Needs Assistance Bed Mobility: Rolling     Supine to sit: Mod assist     General bed mobility comments: patient required mod A to advacne to EOB with  HOB raised and cues to attempt movement prior to asking for help             ADL either performed or assessed with clinical judgement   ADL Overall ADL's : Needs assistance/impaired Eating/Feeding: Minimal assistance;Sitting Eating/Feeding Details (indicate cue type and reason): with education on being up out of bed to have better movement of BUE. patient noted to have IV placed on R anterior elbow. patient reprotign pain with elbow flexion to attempt to get to mouth. patients family was present in room reporting they would help patient feed at first sign of difficulty. patient and family were educated on importance of patient attempting to eat prior to providing assistance. patient reported difficulty of movement and noted to have drool on L side of mouth with family reporting this was normal for patient. red foam handles was provided to patient to use.     Toilet Transfer: +2 for physical assistance;+2 for safety/equipment;Moderate assistance;Stand-pivot;Rolling walker (2 wheels) Toilet Transfer Details (indicate cue type and reason): to transfer to recliner with increased time.                  Cognition Arousal: Alert Behavior During Therapy: Flat affect Overall Cognitive Status: Within Functional Limits for tasks assessed                  General Comments patients family was present in room reporting increased displeasure with hosptial that "MD had not seen patient since he got here" patient and family were educated that MD has to see patient every day and that sometimes patients forget that they have interacted with MD. patients wife reported " he  is with it enough to know if MD has been here or not". education was provided on hosptial confusion eing in same room for hours ect. nurse made aware. patient's family educated on writing number and questions for MD on board in room. patients family verbalized understanding.    Pertinent Vitals/ Pain       Pain Assessment Pain  Assessment: Faces Faces Pain Scale: Hurts little more Pain Location: LUE and L side Pain Descriptors / Indicators: Sore, Tender, Guarding Pain Intervention(s): Limited activity within patient's tolerance, Monitored during session, Repositioned         Frequency  Min 1X/week        Progress Toward Goals  OT Goals(current goals can now be found in the care plan section)  Progress towards OT goals: Progressing toward goals     Plan         AM-PAC OT "6 Clicks" Daily Activity     Outcome Measure   Help from another person eating meals?: A Little Help from another person taking care of personal grooming?: A Little Help from another person toileting, which includes using toliet, bedpan, or urinal?: A Lot Help from another person bathing (including washing, rinsing, drying)?: A Lot Help from another person to put on and taking off regular upper body clothing?: A Lot Help from another person to put on and taking off regular lower body clothing?: A Lot 6 Click Score: 14    End of Session Equipment Utilized During Treatment: Gait belt;Rolling walker (2 wheels)  OT Visit Diagnosis: Pain;Muscle weakness (generalized) (M62.81);History of falling (Z91.81) Pain - Right/Left: Left Pain - part of body: Arm   Activity Tolerance Patient limited by pain   Patient Left in chair;with call bell/phone within reach;with chair alarm set   Nurse Communication Mobility status        Time: 5621-3086 OT Time Calculation (min): 31 min  Charges: OT General Charges $OT Visit: 1 Visit OT Treatments $Self Care/Home Management : 23-37 mins  Rosalio Loud, MS Acute Rehabilitation Department Office# [phone]   Selinda Flavin 06/19/2023, 3:11 PM

## 2023-06-19 NOTE — Progress Notes (Signed)
Pharmacy Antibiotic Note  Joseph Rangel is a 77 y.o. male admitted on 06/16/2023 with cellulitis.  Pharmacy has been consulted for Vancomycin and Cefepime dosing.  Plan: Vancomycin 1500mg  IV q24h Vancomycin levels at steady state, as indicated Cefepime 2g IV q8h Monitor renal function, cultures, clinical course  Height: 5\' 9"  (175.3 cm) Weight: 72.1 kg (159 lb) IBW/kg (Calculated) : 70.7  Temp (24hrs), Avg:98.8 F (37.1 C), Min:97.6 F (36.4 C), Max:99.5 F (37.5 C)  Recent Labs  Lab 06/16/23 1553 06/17/23 0638 06/17/23 1046 06/17/23 1048 06/18/23 0511 06/19/23 0540  WBC 10.3 9.6  --   --  8.0 7.5  CREATININE 0.88 0.92 1.06 1.06 1.08 0.98    Estimated Creatinine Clearance: 63.1 mL/min (by C-G formula based on SCr of 0.98 mg/dL).    No Known Allergies  Antimicrobials this admission: 9/26 Cefazolin >> 9/28 9/28 Vancomycin >> 9/28 Cefepime >>  Dose adjustments this admission: --  Microbiology results: none  Thank you for allowing pharmacy to be a part of this patient's care.   Greer Pickerel, PharmD, BCPS Clinical Pharmacist 06/19/2023 1:09 PM

## 2023-06-20 DIAGNOSIS — E876 Hypokalemia: Secondary | ICD-10-CM | POA: Diagnosis not present

## 2023-06-20 LAB — URIC ACID: Uric Acid, Serum: 3.4 mg/dL — ABNORMAL LOW (ref 3.7–8.6)

## 2023-06-20 LAB — CBC
HCT: 24.6 % — ABNORMAL LOW (ref 39.0–52.0)
Hemoglobin: 7.9 g/dL — ABNORMAL LOW (ref 13.0–17.0)
MCH: 29.4 pg (ref 26.0–34.0)
MCHC: 32.1 g/dL (ref 30.0–36.0)
MCV: 91.4 fL (ref 80.0–100.0)
Platelets: 217 10*3/uL (ref 150–400)
RBC: 2.69 MIL/uL — ABNORMAL LOW (ref 4.22–5.81)
RDW: 12.8 % (ref 11.5–15.5)
WBC: 6.9 10*3/uL (ref 4.0–10.5)
nRBC: 0 % (ref 0.0–0.2)

## 2023-06-20 LAB — BASIC METABOLIC PANEL
Anion gap: 10 (ref 5–15)
BUN: 17 mg/dL (ref 8–23)
CO2: 21 mmol/L — ABNORMAL LOW (ref 22–32)
Calcium: 8 mg/dL — ABNORMAL LOW (ref 8.9–10.3)
Chloride: 102 mmol/L (ref 98–111)
Creatinine, Ser: 0.86 mg/dL (ref 0.61–1.24)
GFR, Estimated: >60 mL/min (ref >60)
Glucose, Bld: 95 mg/dL (ref 70–99)
Potassium: 3.3 mmol/L — ABNORMAL LOW (ref 3.5–5.1)
Sodium: 133 mmol/L — ABNORMAL LOW (ref 135–145)

## 2023-06-20 MED ORDER — SODIUM CHLORIDE 0.9% FLUSH: 10.0000 mL | INTRAVENOUS | Status: DC | PRN

## 2023-06-20 MED ORDER — ORAL CARE MOUTH RINSE: 15.0000 mL | OROMUCOSAL | Status: DC | PRN

## 2023-06-20 MED ORDER — POTASSIUM CHLORIDE CRYS ER 20 MEQ PO TBCR
40.0000 meq | EXTENDED_RELEASE_TABLET | Freq: Once | ORAL | Status: AC
  Administered 2023-06-20: 40 meq via ORAL
  Filled 2023-06-20: qty 2

## 2023-06-20 NOTE — Progress Notes (Addendum)
IV site catheter displaced in right AC. IV removed. RN attempted and unsuccessful. IV team consulted was placed and awaiting arrival to administer antibiotics Cefepime and vancomycin 1700.

## 2023-06-20 NOTE — Plan of Care (Signed)
  Problem: Activity: Goal: Risk for activity intolerance will decrease Outcome: Progressing   Problem: Coping: Goal: Level of anxiety will decrease Outcome: Progressing   Problem: Pain Managment: Goal: General experience of comfort will improve Outcome: Progressing   Problem: Safety: Goal: Ability to remain free from injury will improve Outcome: Progressing   Problem: Skin Integrity: Goal: Risk for impaired skin integrity will decrease Outcome: Progressing   

## 2023-06-20 NOTE — Progress Notes (Signed)
[facility]                                                                              Izick Gasbarro, is a 77 y.o. male, DOB - 1946-08-19, VWU:[id number] Admit date - 06/16/2023    Outpatient Primary MD for the patient is [name], MD  LOS - 3  days  Chief Complaint  Patient presents with   Weakness   Fall   Arm Injury       Brief summary   Patient is a 77 year old male with HTN, HLP, GERD, seizure disorder, metastatic prostate CA with spread to intra-abdominal lymph nodes on daily Zytiga, prednisone, Lupron every 3 months, B12 deficiency, chronic hypokalemia on oral potassium presented with dizziness and falling episodes at home.  Per patient, he normally uses a cane and a walker at home at baseline however has been having issues with gait instability for the past week, fallen x 2 at home.  Recently fell a day before the admission on his left side with pain around his left elbow.  Left elbow x-ray diffuse soft tissue swelling, no fracture or dislocation but diffuse swelling MRI brain showed no acute findings, postop changes from prior right-sided craniotomy, age-related atrophy.    Assessment & Plan    Severe hypokalemia -Potassium 2.2 on admission, aggressively replaced -Noted patient is on hydrochlorothiazide at home, will need to discontinue it at discharge  Prolonged QTc -QTc on admission 530, improved to  QTc 467 -Avoid QT prolonging drugs - keep K ~4, Mag ~2    Generalized weakness   Fall at home -MRI brain showed no acute stroke, likely in the setting of hyponatremia, hypokalemia -B12 lower limits of normal, TSH 1.2, replace electrolytes.  -PT evaluation-- SNF-- TOC consulted  Hyponatremia -Stable, asymptomatic   Essential hypertension -Continue atenolol, irbesartan -Discontinue HCTZ at discharge    Prostate cancer metastatic to intraabdominal lymph node (HCC) -On Zytiga, prednisone, Lupron every 3 months -Follows Dr. Al Pimple, last seen  in 02/2023  Chronic normocytic normochromic anemia -Currently close to baseline, no obvious bleeding- denies dark stools but wife says he has been c/o stomach pain for weeks- check stools for blood  Seizure disorder -Continue Dilantin  GERD Continue PPI  Left elbow pain/? cellulitis -Status post fall, x-ray negative for any fracture or dislocation. -+ swelling but duplex negative -was started on ancef upon admission- added vanc and changed to maxipime with improvement in swelling -check uric acid - doubt gout but pain out of proportion to exam and now in multiple joints -- consider further imaging if not better  obesity Estimated body mass index is 23.48 kg/m as calculated from the following:   Height as of this encounter: 5\' 9"  (1.753 m).   Weight as of this encounter: 72.1 kg.   Code Status: Full code DVT Prophylaxis:  enoxaparin (LOVENOX) injection 40 mg Start: 06/17/23 1000 SCDs Start: 06/16/23 2201 updated wife on phone 9/28  Level of Care: Level of care: Telemetry Disposition Plan:      PT recommends SNF -- toc consulted     Medications  atenolol  25 mg Oral Daily   vitamin B-12  1,000 mcg Oral Daily   enoxaparin (LOVENOX) injection  40 mg Subcutaneous Q24H   irbesartan  300 mg Oral Daily   pantoprazole  40 mg Oral Daily   phenytoin  300 mg Oral Daily   potassium chloride  40 mEq Oral Once   predniSONE  5 mg Oral Q breakfast      Subjective:   Pain in right elbow now too Also with pain in stomach for "months"-- denies dark stools  Objective:   Vitals:   06/19/23 2008 06/19/23 2100 06/19/23 2301 06/20/23 0502  BP: (!) 171/88 (!) 173/83 (!) 158/75 (!) 155/79  Pulse: 90 83 (!) 42 82  Resp: 17   15  Temp: (!) 100.4 F (38 C) 98.8 F (37.1 C)  98.9 F (37.2 C)  TempSrc: Oral Oral  Oral  SpO2: 99%   99%  Weight:      Height:        Intake/Output Summary (Last 24 hours) at 06/20/2023 1003 Last data filed at 06/20/2023 0502 Gross per 24 hour  Intake  0 ml  Output 300 ml  Net -300 ml     Wt Readings from Last 3 Encounters:  06/16/23 72.1 kg  02/25/23 73.2 kg  12/03/22 75.4 kg   Physical Exam  General: Appearance:    elderly male in no acute distress   Left elbow swollen and warm but improved from yesterday  Lungs:     respirations unlabored  Heart:    Normal heart rate.    MS:   All extremities are intact.   Neurologic:   Awake, alert    Data Reviewed:  I have personally reviewed following labs    CBC Lab Results  Component Value Date   WBC 6.9 06/20/2023   RBC 2.69 (L) 06/20/2023   HGB 7.9 (L) 06/20/2023   HCT 24.6 (L) 06/20/2023   MCV 91.4 06/20/2023   MCH 29.4 06/20/2023   PLT 217 06/20/2023   MCHC 32.1 06/20/2023   RDW 12.8 06/20/2023   LYMPHSABS 2.0 02/25/2023   MONOABS 0.7 02/25/2023   EOSABS 0.2 02/25/2023   BASOSABS 0.1 02/25/2023     Last metabolic panel Lab Results  Component Value Date   NA 133 (L) 06/20/2023   K 3.3 (L) 06/20/2023   CL 102 06/20/2023   CO2 21 (L) 06/20/2023   BUN 17 06/20/2023   CREATININE 0.86 06/20/2023   GLUCOSE 95 06/20/2023   GFRNONAA >60 06/20/2023   GFRAA >90 02/25/2012   CALCIUM 8.0 (L) 06/20/2023   PHOS 2.7 06/17/2023   PROT 7.6 02/25/2023   ALBUMIN 4.2 02/25/2023   LABGLOB 3.4 08/09/2020   AGRATIO 1.1 08/09/2020   BILITOT 0.4 02/25/2023   ALKPHOS 166 (H) 02/25/2023   AST 11 (L) 02/25/2023   ALT 5 02/25/2023   ANIONGAP 10 06/20/2023    CBG (last 3)  No results for input(s): "GLUCAP" in the last 72 hours.    Coagulation Profile: No results for input(s): "INR", "PROTIME" in the last 168 hours.   Radiology Studies: I have personally reviewed the imaging studies  VAS [place] UPPER EXTREMITY VENOUS DUPLEX  Result Date: 06/19/2023 UPPER VENOUS STUDY  Patient Name:  BENN TARVER  Date of Exam:   06/19/2023 Medical Rec #: [id number]     Accession #:    [id number] Date of Birth: Dec 27, 1945      Patient Gender: M Patient Age:   89 years Exam Location:  [facility] Procedure:      VAS  [facility]                                                                              Izick Gasbarro, is a 77 y.o. male, DOB - 1946-08-19, VWU:[id number] Admit date - 06/16/2023    Outpatient Primary MD for the patient is [name], MD  LOS - 3  days  Chief Complaint  Patient presents with   Weakness   Fall   Arm Injury       Brief summary   Patient is a 77 year old male with HTN, HLP, GERD, seizure disorder, metastatic prostate CA with spread to intra-abdominal lymph nodes on daily Zytiga, prednisone, Lupron every 3 months, B12 deficiency, chronic hypokalemia on oral potassium presented with dizziness and falling episodes at home.  Per patient, he normally uses a cane and a walker at home at baseline however has been having issues with gait instability for the past week, fallen x 2 at home.  Recently fell a day before the admission on his left side with pain around his left elbow.  Left elbow x-ray diffuse soft tissue swelling, no fracture or dislocation but diffuse swelling MRI brain showed no acute findings, postop changes from prior right-sided craniotomy, age-related atrophy.    Assessment & Plan    Severe hypokalemia -Potassium 2.2 on admission, aggressively replaced -Noted patient is on hydrochlorothiazide at home, will need to discontinue it at discharge  Prolonged QTc -QTc on admission 530, improved to  QTc 467 -Avoid QT prolonging drugs - keep K ~4, Mag ~2    Generalized weakness   Fall at home -MRI brain showed no acute stroke, likely in the setting of hyponatremia, hypokalemia -B12 lower limits of normal, TSH 1.2, replace electrolytes.  -PT evaluation-- SNF-- TOC consulted  Hyponatremia -Stable, asymptomatic   Essential hypertension -Continue atenolol, irbesartan -Discontinue HCTZ at discharge    Prostate cancer metastatic to intraabdominal lymph node (HCC) -On Zytiga, prednisone, Lupron every 3 months -Follows Dr. Al Pimple, last seen  in 02/2023  Chronic normocytic normochromic anemia -Currently close to baseline, no obvious bleeding- denies dark stools but wife says he has been c/o stomach pain for weeks- check stools for blood  Seizure disorder -Continue Dilantin  GERD Continue PPI  Left elbow pain/? cellulitis -Status post fall, x-ray negative for any fracture or dislocation. -+ swelling but duplex negative -was started on ancef upon admission- added vanc and changed to maxipime with improvement in swelling -check uric acid - doubt gout but pain out of proportion to exam and now in multiple joints -- consider further imaging if not better  obesity Estimated body mass index is 23.48 kg/m as calculated from the following:   Height as of this encounter: 5\' 9"  (1.753 m).   Weight as of this encounter: 72.1 kg.   Code Status: Full code DVT Prophylaxis:  enoxaparin (LOVENOX) injection 40 mg Start: 06/17/23 1000 SCDs Start: 06/16/23 2201 updated wife on phone 9/28  Level of Care: Level of care: Telemetry Disposition Plan:      PT recommends SNF -- toc consulted     Medications  atenolol  25 mg Oral Daily   vitamin B-12

## 2023-06-21 DIAGNOSIS — C772 Secondary and unspecified malignant neoplasm of intra-abdominal lymph nodes: Secondary | ICD-10-CM

## 2023-06-21 DIAGNOSIS — R9431 Abnormal electrocardiogram [ECG] [EKG]: Secondary | ICD-10-CM | POA: Diagnosis not present

## 2023-06-21 DIAGNOSIS — E871 Hypo-osmolality and hyponatremia: Secondary | ICD-10-CM | POA: Diagnosis not present

## 2023-06-21 DIAGNOSIS — C61 Malignant neoplasm of prostate: Secondary | ICD-10-CM

## 2023-06-21 DIAGNOSIS — E876 Hypokalemia: Secondary | ICD-10-CM | POA: Diagnosis not present

## 2023-06-21 DIAGNOSIS — R531 Weakness: Secondary | ICD-10-CM | POA: Diagnosis not present

## 2023-06-21 LAB — FERRITIN: Ferritin: 200 ng/mL (ref 24–336)

## 2023-06-21 LAB — CBC
HCT: 23.5 % — ABNORMAL LOW (ref 39.0–52.0)
Hemoglobin: 7.6 g/dL — ABNORMAL LOW (ref 13.0–17.0)
MCH: 29.9 pg (ref 26.0–34.0)
MCHC: 32.3 g/dL (ref 30.0–36.0)
MCV: 92.5 fL (ref 80.0–100.0)
Platelets: 221 10*3/uL (ref 150–400)
RBC: 2.54 MIL/uL — ABNORMAL LOW (ref 4.22–5.81)
RDW: 12.9 % (ref 11.5–15.5)
WBC: 6.1 10*3/uL (ref 4.0–10.5)
nRBC: 0 % (ref 0.0–0.2)

## 2023-06-21 LAB — IRON AND TIBC
Iron: 25 ug/dL — ABNORMAL LOW (ref 45–182)
Saturation Ratios: 16 % — ABNORMAL LOW (ref 17.9–39.5)
TIBC: 155 ug/dL — ABNORMAL LOW (ref 250–450)
UIBC: 130 ug/dL

## 2023-06-21 LAB — BASIC METABOLIC PANEL
Anion gap: 8 (ref 5–15)
BUN: 19 mg/dL (ref 8–23)
CO2: 21 mmol/L — ABNORMAL LOW (ref 22–32)
Calcium: 7.8 mg/dL — ABNORMAL LOW (ref 8.9–10.3)
Chloride: 104 mmol/L (ref 98–111)
Creatinine, Ser: 1.02 mg/dL (ref 0.61–1.24)
GFR, Estimated: >60 mL/min (ref >60)
Glucose, Bld: 109 mg/dL — ABNORMAL HIGH (ref 70–99)
Potassium: 3.5 mmol/L (ref 3.5–5.1)
Sodium: 133 mmol/L — ABNORMAL LOW (ref 135–145)

## 2023-06-21 LAB — FOLATE: Folate: 8.2 ng/mL (ref >5.9)

## 2023-06-21 LAB — SEDIMENTATION RATE: Sed Rate: 75 mm/h — ABNORMAL HIGH (ref 0–16)

## 2023-06-21 LAB — RETICULOCYTES
Immature Retic Fract: 5.7 % (ref 2.3–15.9)
RBC.: 2.74 MIL/uL — ABNORMAL LOW (ref 4.22–5.81)
Retic Count, Absolute: 17.3 10*3/uL — ABNORMAL LOW (ref 19.0–186.0)
Retic Ct Pct: 0.6 % (ref 0.4–3.1)

## 2023-06-21 LAB — C-REACTIVE PROTEIN: CRP: 21.6 mg/dL — ABNORMAL HIGH (ref <1.0)

## 2023-06-21 LAB — VITAMIN B12: Vitamin B-12: 602 pg/mL (ref 180–914)

## 2023-06-21 MED ORDER — PREDNISONE 20 MG PO TABS
40.0000 mg | ORAL_TABLET | Freq: Every day | ORAL | Status: DC
  Administered 2023-06-21 – 2023-06-22 (×2): 40 mg via ORAL
  Filled 2023-06-21 (×2): qty 2

## 2023-06-21 MED ORDER — ABIRATERONE ACETATE 250 MG PO TABS
1000.0000 mg | ORAL_TABLET | Freq: Every day | ORAL | Status: DC
  Administered 2023-06-22: 1000 mg via ORAL
  Filled 2023-06-21: qty 4

## 2023-06-21 MED ORDER — LEUPROLIDE ACETATE 7.5 MG IM KIT
7.5000 mg | PACK | Freq: Once | INTRAMUSCULAR | Status: AC
  Administered 2023-06-21: 7.5 mg via INTRAMUSCULAR
  Filled 2023-06-21: qty 7.5

## 2023-06-21 NOTE — TOC Progression Note (Signed)
Transition of Care [facility]) - Progression Note    Patient Details  Name: Joseph Rangel MRN: [id number] Date of Birth: 17-Oct-1945  [facility] CM/SW Contact  Darleene Cleaver, [place] Phone Number: 06/21/2023, 9:31 AM  Clinical Narrative:     Patient has been faxed out awaiting bed offers.  Expected Discharge Plan:  (TBD) Barriers to Discharge: Continued Medical Work up  Expected Discharge Plan and Services    SNF for short term rehab.                                           Social Determinants of Health (SDOH) Interventions SDOH Screenings   Food Insecurity: No Food Insecurity (06/17/2023)  Housing: Low Risk  (06/17/2023)  Transportation Needs: No Transportation Needs (06/17/2023)  Utilities: Not At Risk (06/17/2023)  Tobacco Use: High Risk (06/16/2023)    Readmission Risk Interventions    06/17/2023    3:29 PM  Readmission Risk Prevention Plan  Transportation Screening Complete  PCP or Specialist Appt within 5-7 Days Complete  Home Care Screening Complete  Medication Review (RN CM) Complete

## 2023-06-21 NOTE — Progress Notes (Signed)
Physical Therapy Treatment Patient Details Name: Joseph Rangel MRN: [id number] DOB: 11-10-1945 Today's Date: 06/21/2023   History of Present Illness 77 year old male with HTN, HLP, GERD, seizure disorder, metastatic prostate CA with spread to intra-abdominal lymph nodes on daily Zytiga, prednisone, Lupron every 3 months, B12 deficiency, chronic hypokalemia on oral potassium presented with dizziness and falling episodes at home.  Pt admitted 06/16/23 for severe hypokalemia.  Per patient, he normally uses a cane and a walker at home at baseline however has been having issues with gait instability for the past week, fallen x 2 at home.  Left elbow x-ray negative for any fracture or dislocation    PT Comments  General Comments: AxO x 2 required MAX encouragement c/o feeling "bad"   After walking he stated, "that wasnt that bad". Assisted OOB required increased time and effort.  Mod assist for B LE to come off bed then Max Assist for upper body as well as Total Assist to complete scooting to EOB using bed pad.   General transfer comment: pt required cues for hand placement and assist to rise and steady, MAX encouragement.   General Gait Details: limited amb distance due to weakness/effort.  Unstable with decreased weight shift to right and difficulty grasping walker with left hand (edema)  Required + 2 asisst for safety.  Returned to room in recliner.   Pt will need ST Rehab at SNF to address mobility and functional decline prior to safely returning home.    If plan is discharge home, recommend the following: A lot of help with walking and/or transfers;A lot of help with bathing/dressing/bathroom;Help with stairs or ramp for entrance;Assistance with cooking/housework   Can travel by private vehicle     No  Equipment Recommendations  None recommended by PT    Recommendations for Other Services       Precautions / Restrictions Precautions Precautions: Fall Restrictions Weight Bearing Restrictions:  No     Mobility  Bed Mobility      Mod assist for B LE to come off bed then Max Assist for upper body as well as Total Assist to complete scooting to EOB using bed pad.                Transfers Overall transfer level: Needs assistance Equipment used: Rolling walker (2 wheels) Transfers: Sit to/from Stand Sit to Stand: Mod assist, +2 safety/equipment           General transfer comment: pt required cues for hand placement and assist to rise and steady, MAX encouragement.    Ambulation/Gait Ambulation/Gait assistance: Mod assist, +2 physical assistance, +2 safety/equipment Gait Distance (Feet): 14 Feet Assistive device: Rolling walker (2 wheels) Gait Pattern/deviations: Step-through pattern, Decreased stride length, Trunk flexed Gait velocity: decreased     General Gait Details: limited amb distance due to weakness/effort.  Unstable with decreased weight shift to right and difficulty grasping walker with left hand (edema)  Required + 2 asisst for safety.   Stairs             Wheelchair Mobility     Tilt Bed    Modified Rankin (Stroke Patients Only)       Balance                                            Cognition Arousal: Alert Behavior During Therapy: Flat affect  General Comments: AxO x 2 required MAX encouragement c/o feeling "bad"   After walking he stated, "that wasnt that bad".        Exercises      General Comments        Pertinent Vitals/Pain Pain Assessment Faces Pain Scale: Hurts a little bit Pain Location: R side/hip but able to fully WB and c/o L UE/elbow pain present with edema Pain Descriptors / Indicators: Sore, Tender, Guarding    Home Living                          Prior Function            PT Goals (current goals can now be found in the care plan section) Progress towards PT goals: Progressing toward goals    Frequency    Min  1X/week      PT Plan      Co-evaluation              AM-PAC PT "6 Clicks" Mobility   Outcome Measure  Help needed turning from your back to your side while in a flat bed without using bedrails?: A Lot Help needed moving from lying on your back to sitting on the side of a flat bed without using bedrails?: A Lot Help needed moving to and from a bed to a chair (including a wheelchair)?: A Lot Help needed standing up from a chair using your arms (e.g., wheelchair or bedside chair)?: A Lot Help needed to walk in hospital room?: A Lot Help needed climbing 3-5 steps with a railing? : Total 6 Click Score: 11    End of Session Equipment Utilized During Treatment: Gait belt Activity Tolerance: Patient limited by fatigue Patient left: with call bell/phone within reach;in chair;with chair alarm set Nurse Communication: Mobility status PT Visit Diagnosis: Difficulty in walking, not elsewhere classified (R26.2)     Time: 4034-7425 PT Time Calculation (min) (ACUTE ONLY): 25 min  Charges:    $Gait Training: 8-22 mins $Therapeutic Activity: 8-22 mins PT General Charges $$ ACUTE PT VISIT: 1 Visit                     {[name]  PTA Acute  Rehabilitation Services Office M-F          [phone]

## 2023-06-21 NOTE — [place] FL2 (Signed)
[place] MEDICAID FL2 LEVEL OF CARE FORM     IDENTIFICATION  Patient Name: Joseph Rangel Birthdate: 30-May-1946 Sex: male Admission Date (Current Location): 06/16/2023  [facility] and [place] Number:  Producer, television/film/video and Address:  [facility],  [address] [place], [place] [address]      Provider Number: [id number]  Attending Physician Name and Address:  Cathren Harsh, MD  Relative Name and Phone Number:  Roary, Curb [phone]    Current Level of Care: Hospital Recommended Level of Care: Skilled Nursing Facility Prior Approval Number:    Date Approved/Denied:   PASRR Number: [id number]  Discharge Plan: SNF    Current Diagnoses: Patient Active Problem List   Diagnosis Date Noted   Hyponatremia 06/17/2023   Generalized weakness 06/17/2023   Fall at home, initial encounter 06/17/2023   Normocytic normochromic anemia 06/25/2021   Hypokalemia 03/07/2021   AKI (acute kidney injury) (HCC) 01/31/2021   Right leg pain 01/31/2021   Prostate cancer metastatic to intraabdominal lymph node (HCC) 10/02/2020    Orientation RESPIRATION BLADDER Height & Weight     Time, Situation, Self, Place  Normal Incontinent Weight: 159 lb (72.1 kg) Height:  5\' 9"  (175.3 cm)  BEHAVIORAL SYMPTOMS/MOOD NEUROLOGICAL BOWEL NUTRITION STATUS      Continent Diet  AMBULATORY STATUS COMMUNICATION OF NEEDS Skin   Limited Assist Verbally Normal                       Personal Care Assistance Level of Assistance  Bathing, Dressing, Feeding Bathing Assistance: Limited assistance Feeding assistance: Independent Dressing Assistance: Limited assistance     Functional Limitations Info  Sight, Speech, Hearing Sight Info: Adequate Hearing Info: Adequate Speech Info: Adequate    SPECIAL CARE FACTORS FREQUENCY  PT (By licensed PT), OT (By licensed OT)     PT Frequency: Min 5x  a week OT Frequency: Min 5x a week            Contractures Contractures Info: Not  present    Additional Factors Info  Code Status, Allergies Code Status Info: Full Code Allergies Info: NKA           Current Medications (06/21/2023):  This is the current hospital active medication list Current Facility-Administered Medications  Medication Dose Route Frequency Provider Last Rate Last Admin   acetaminophen (TYLENOL) tablet 650 mg  650 mg Oral Q6H PRN Kc, Ramesh, MD   650 mg at 06/20/23 1033   Or   acetaminophen (TYLENOL) suppository 650 mg  650 mg Rectal Q6H PRN Kc, Ramesh, MD       atenolol (TENORMIN) tablet 25 mg  25 mg Oral Daily Rai, Ripudeep K, MD   25 mg at 06/20/23 1035   ceFEPIme (MAXIPIME) 2 g in sodium chloride 0.9 % 100 mL IVPB  2 g Intravenous Q8H Jamse Mead, RPH 200 mL/hr at 06/21/23 0335 2 g at 06/21/23 0335   cyanocobalamin (VITAMIN B12) tablet 1,000 mcg  1,000 mcg Oral Daily Marlin Canary U, DO   1,000 mcg at 06/20/23 1034   enoxaparin (LOVENOX) injection 40 mg  40 mg Subcutaneous Q24H Kc, Ramesh, MD   40 mg at 06/20/23 1034   HYDROcodone-acetaminophen (NORCO) 10-325 MG per tablet 1 tablet  1 tablet Oral Q4H PRN Lanae Boast, MD   1 tablet at 06/21/23 0339   irbesartan (AVAPRO) tablet 300 mg  300 mg Oral Daily Kc, Dayna Barker, MD   300 mg at 06/20/23 1033   Oral  care mouth rinse  15 mL Mouth Rinse PRN Benjamine Mola, Jessica U, DO       pantoprazole (PROTONIX) EC tablet 40 mg  40 mg Oral Daily Kc, Ramesh, MD   40 mg at 06/20/23 1033   phenytoin (DILANTIN) ER capsule 300 mg  300 mg Oral Daily Kc, Ramesh, MD   300 mg at 06/20/23 1034   predniSONE (DELTASONE) tablet 5 mg  5 mg Oral Q breakfast Kc, Ramesh, MD   5 mg at 06/21/23 0847   senna-docusate (Senokot-S) tablet 1 tablet  1 tablet Oral QHS PRN Lanae Boast, MD   1 tablet at 06/20/23 1034   sodium chloride flush (NS) 0.9 % injection 10-40 mL  10-40 mL Intracatheter PRN Joseph Art, DO       vancomycin (VANCOREADY) IVPB 1500 mg/300 mL  1,500 mg Intravenous Q24H Jamse Mead, RPH 150 mL/hr at 06/20/23 2049 1,500  mg at 06/20/23 2049     Discharge Medications: Please see discharge summary for a list of discharge medications.  Relevant Imaging Results:  Relevant Lab Results:   Additional Information SSN [id number]  Darleene Cleaver, LCSW

## 2023-06-21 NOTE — Progress Notes (Signed)
[facility] Telephone:([phone]) [phone]   Fax:([phone]) [phone]  PROGRESS NOTE  Patient Care Team: [name], MD as PCP - General (Family Medicine)  Hematological/Oncological History  # Metastatic Prostate Cancer w/ Spread to Intraabdominal Lymph Nodes --previously followed by Dr. Al Pimple. Currently on abiraterone therapy 06/18/2021: PSA <0.01  Interval History:   Joseph Rangel 77 y.o. male with medical history significant for metastatic prostate cancer who is admitted with chief complaint of dizziness and falling.  He has some baseline issues with his knee and apparently had some gait instability for the past week and fell on the day of admission.  He tells me that he has been taking his Zytiga and prednisone as prescribed.  He missed his last appointment.  He had x-ray of the left elbow which showed soft tissue swelling.  MRI brain was negative.  He was found to have severe hypokalemia and this was aggressively replaced.  He was recommended p.o. supplementation several times but he remains noncompliant with it.  He apparently will be discharged to a skilled nursing facility which she is not quite sure about but hematology was made aware of this patient's admission since he is overdue for his Lupron. He tells me that he is feeling okay today.  I have known him for a few years now and he appears to be at baseline. Rest of the pertinent 10 point ROS reviewed and negative.  MEDICAL HISTORY:  Past Medical History:  Diagnosis Date   Arthritis    Cancer [facility])    prostate    Frequency of urination    Hypertension    Seizures (HCC)    after brain surg - none in past 20 yrs   Speech disorder    due to brain surg    SURGICAL HISTORY: Past Surgical History:  Procedure Laterality Date   BRAIN SURGERY     BLOOD CLOT REMOVED 1980   MASS EXCISION  02/24/2012   Procedure: MINOR EXCISION OF MASS;  Surgeon: Valetta Fuller, MD;  Location: [facility];  Service: Urology;;  excision of neck  lesion   ROBOT ASSISTED LAPAROSCOPIC RADICAL PROSTATECTOMY  02/24/2012   Procedure: ROBOTIC ASSISTED LAPAROSCOPIC RADICAL PROSTATECTOMY;  Surgeon: Valetta Fuller, MD;  Location: [facility];  Service: Urology;  Laterality: N/A;           SOCIAL HISTORY: Social History   Socioeconomic History   Marital status: Married    Spouse name: Not on file   Number of children: Not on file   Years of education: Not on file   Highest education level: Not on file  Occupational History   Not on file  Tobacco Use   Smoking status: Every Day    Types: Cigarettes   Smokeless tobacco: Never   Tobacco comments:    2 cigarettes per day  Vaping Use   Vaping status: Never Used  Substance and Sexual Activity   Alcohol use: No   Drug use: No   Sexual activity: Not on file  Other Topics Concern   Not on file  Social History Narrative   Not on file   Social Determinants of Health   Financial Resource Strain: Not on file  Food Insecurity: No Food Insecurity (06/17/2023)   Hunger Vital Sign    Worried About Running Out of Food in the Last Year: Never true    Ran Out of Food in the Last Year: Never true  Transportation Needs: No Transportation Needs (06/17/2023)   PRAPARE - Transportation  Lack of Transportation (Medical): No    Lack of Transportation (Non-Medical): No  Physical Activity: Not on file  Stress: Not on file  Social Connections: Not on file  Intimate Partner Violence: Not At Risk (06/17/2023)   Humiliation, Afraid, Rape, and Kick questionnaire    Fear of Current or Ex-Partner: No    Emotionally Abused: No    Physically Abused: No    Sexually Abused: No    FAMILY HISTORY: History reviewed. No pertinent family history.  ALLERGIES:  has No Known Allergies.  MEDICATIONS:  Current Facility-Administered Medications  Medication Dose Route Frequency Provider Last Rate Last Admin   acetaminophen (TYLENOL) tablet 650 mg  650 mg Oral Q6H PRN Lanae Boast, MD   650 mg at 06/21/23 1424    Or   acetaminophen (TYLENOL) suppository 650 mg  650 mg Rectal Q6H PRN Kc, Dayna Barker, MD       atenolol (TENORMIN) tablet 25 mg  25 mg Oral Daily Rai, Ripudeep K, MD   25 mg at 06/21/23 1011   ceFEPIme (MAXIPIME) 2 g in sodium chloride 0.9 % 100 mL IVPB  2 g Intravenous Q8H Jamse Mead, RPH 200 mL/hr at 06/21/23 1223 2 g at 06/21/23 1223   cyanocobalamin (VITAMIN B12) tablet 1,000 mcg  1,000 mcg Oral Daily Marlin Canary U, DO   1,000 mcg at 06/21/23 1012   enoxaparin (LOVENOX) injection 40 mg  40 mg Subcutaneous Q24H Kc, Ramesh, MD   40 mg at 06/21/23 1011   HYDROcodone-acetaminophen (NORCO) 10-325 MG per tablet 1 tablet  1 tablet Oral Q4H PRN Lanae Boast, MD   1 tablet at 06/21/23 0339   irbesartan (AVAPRO) tablet 300 mg  300 mg Oral Daily Kc, Dayna Barker, MD   300 mg at 06/21/23 1012   Oral care mouth rinse  15 mL Mouth Rinse PRN Vann, Jessica U, DO       pantoprazole (PROTONIX) EC tablet 40 mg  40 mg Oral Daily Kc, Ramesh, MD   40 mg at 06/21/23 1012   phenytoin (DILANTIN) ER capsule 300 mg  300 mg Oral Daily Kc, Ramesh, MD   300 mg at 06/21/23 1011   predniSONE (DELTASONE) tablet 40 mg  40 mg Oral QAC breakfast Rai, Ripudeep K, MD       senna-docusate (Senokot-S) tablet 1 tablet  1 tablet Oral QHS PRN Kc, Dayna Barker, MD   1 tablet at 06/20/23 1034   sodium chloride flush (NS) 0.9 % injection 10-40 mL  10-40 mL Intracatheter PRN Joseph Art, DO       vancomycin (VANCOREADY) IVPB 1500 mg/300 mL  1,500 mg Intravenous Q24H Jamse Mead, RPH 150 mL/hr at 06/20/23 2049 1,500 mg at 06/20/23 2049   PHYSICAL EXAMINATION: ECOG PERFORMANCE STATUS: 2 - Symptomatic, <50% confined to bed  Vitals:   06/21/23 1011 06/21/23 1335  BP: (!) 142/85 (!) 120/96  Pulse: 90 64  Resp:  16  Temp:  98.2 F (36.8 C)  SpO2:  100%      Filed Weights   06/16/23 1517  Weight: 159 lb (72.1 kg)     Physical Exam Constitutional:      General: He is not in acute distress.    Appearance: Normal appearance.   HENT:     Head: Normocephalic and atraumatic.  Cardiovascular:     Rate and Rhythm: Normal rate and regular rhythm.     Pulses: Normal pulses.     Heart sounds: Normal heart sounds.  Pulmonary:  Effort: Pulmonary effort is normal.     Breath sounds: Normal breath sounds. No wheezing.  Musculoskeletal:        General: Swelling (left elbow) present. No tenderness.  Neurological:     General: No focal deficit present.     Mental Status: He is alert.  Psychiatric:        Mood and Affect: Mood normal.      LABORATORY DATA:  I have reviewed the data as listed    Latest Ref Rng & Units 06/21/2023    2:53 AM 06/20/2023    5:36 AM 06/19/2023    5:40 AM  CBC  WBC 4.0 - 10.5 K/uL 6.1  6.9  7.5   Hemoglobin 13.0 - 17.0 g/dL 7.6  7.9  8.4   Hematocrit 39.0 - 52.0 % 23.5  24.6  26.5   Platelets 150 - 400 K/uL 221  217  210        Latest Ref Rng & Units 06/21/2023    2:53 AM 06/20/2023    5:36 AM 06/19/2023    5:40 AM  CMP  Glucose 70 - 99 mg/dL 478  95  295   BUN 8 - 23 mg/dL 19  17  15    Creatinine 0.61 - 1.24 mg/dL 6.21  3.08  6.57   Sodium 135 - 145 mmol/L 133  133  133   Potassium 3.5 - 5.1 mmol/L 3.5  3.3  3.2   Chloride 98 - 111 mmol/L 104  102  101   CO2 22 - 32 mmol/L 21  21  23    Calcium 8.9 - 10.3 mg/dL 7.8  8.0  7.9    RADIOGRAPHIC STUDIES: VAS [place] UPPER EXTREMITY VENOUS DUPLEX  Result Date: 06/19/2023 UPPER VENOUS STUDY  Patient Name:  Joseph Rangel  Date of Exam:   06/19/2023 Medical Rec #: [id number]     Accession #:    [id number] Date of Birth: Nov 18, 1945      Patient Gender: M Patient Age:   7 years Exam Location:  [facility] Procedure:      VAS [place] UPPER EXTREMITY VENOUS DUPLEX Referring Phys: Marlin Canary --------------------------------------------------------------------------------  Indications: Swelling Risk Factors: None identified. Comparison Study: No prior study Performing Technologist: Shona Simpson  Examination Guidelines: A complete evaluation  includes B-mode imaging, spectral Doppler, color Doppler, and power Doppler as needed of all accessible portions of each vessel. Bilateral testing is considered an integral part of a complete examination. Limited examinations for reoccurring indications may be performed as noted.  Right Findings: +----------+------------+---------+-----------+----------+-------+ RIGHT     CompressiblePhasicitySpontaneousPropertiesSummary +----------+------------+---------+-----------+----------+-------+ Subclavian    Full       Yes       Yes                      +----------+------------+---------+-----------+----------+-------+  Left Findings: +----------+------------+---------+-----------+----------+-------+ LEFT      CompressiblePhasicitySpontaneousPropertiesSummary +----------+------------+---------+-----------+----------+-------+ IJV           Full       Yes       Yes                      +----------+------------+---------+-----------+----------+-------+ Subclavian    Full       Yes       Yes                      +----------+------------+---------+-----------+----------+-------+ Axillary      Full       Yes  Yes                      +----------+------------+---------+-----------+----------+-------+ Brachial      Full       Yes       Yes                      +----------+------------+---------+-----------+----------+-------+ Radial        Full       Yes       Yes                      +----------+------------+---------+-----------+----------+-------+ Ulnar         Full       Yes       Yes                      +----------+------------+---------+-----------+----------+-------+ Cephalic                                  retracted         +----------+------------+---------+-----------+----------+-------+ Cephalic vein appears compressed by the passive pressures of the swelling  Summary:  Right: No evidence of thrombosis in the subclavian.  Left: No evidence of  deep vein thrombosis in the upper extremity. No evidence of superficial vein thrombosis in the upper extremity.  *See table(s) above for measurements and observations.  Diagnosing physician: Gerarda Fraction Electronically signed by Gerarda Fraction on 06/19/2023 at 1:38:19 PM.    Final    MR BRAIN WO CONTRAST  Result Date: 06/16/2023 CLINICAL DATA:  Initial evaluation for neuro deficit, stroke suspected. EXAM: MRI HEAD WITHOUT CONTRAST TECHNIQUE: Multiplanar, multiecho pulse sequences of the brain and surrounding structures were obtained without intravenous contrast. COMPARISON:  None Available. FINDINGS: Brain: Diffuse prominence of the CSF containing spaces compatible generalized cerebral atrophy. Mild chronic microvascular ischemic disease with a few small remote lacunar infarcts about the bilateral basal ganglia. Postoperative changes from prior right sided craniotomy. Chronic cystic encephalomalacia of with gliosis present within the underlying right frontotemporal region. Mild scattered chronic hemosiderin staining present within this location. No abnormal foci of restricted diffusion to suggest acute or subacute ischemia. Gray-white matter differentiation otherwise maintained. No other acute or chronic intracranial blood products. No mass lesion, midline shift or mass effect. Ventricular prominence related to global parenchymal volume loss without hydrocephalus. No extra-axial fluid collection. Pituitary gland suprasellar region within normal limits. Vascular: Major intracranial vascular flow voids are grossly maintained. Skull and upper cervical spine: Degenerative osteoarthritic changes noted about the skull base. Bone marrow signal intensity within normal limits. No acute scalp soft tissue abnormality. Sinuses/Orbits: Globes and orbital soft tissues within normal limits. Prior bilateral ocular lens replacement. Mild scattered mucosal thickening present about the ethmoidal air cells. Paranasal sinuses are  otherwise largely clear. No mastoid effusion. Other: None. IMPRESSION: 1. No acute intracranial abnormality. 2. Postoperative changes from prior right sided craniotomy with underlying chronic cystic encephalomalacia and gliosis within the right frontotemporal region. 3. Underlying age-related cerebral atrophy with mild chronic small vessel ischemic disease. Electronically Signed   By: Rise Mu M.D.   On: 06/16/2023 20:28   DG Elbow Complete Left  Result Date: 06/16/2023 CLINICAL DATA:  Recurrent falls with left arm pain and swelling EXAM: LEFT ELBOW - COMPLETE 4 VIEW COMPARISON:  None Available. FINDINGS: There is no evidence of fracture, dislocation, or joint effusion. Well corticated  ovoid ossification seen only on oblique view, likely degenerative. Diffuse degenerative changes of the elbow. Diffuse soft tissue swelling. IMPRESSION: 1. No acute fracture or dislocation. 2. Diffuse soft tissue swelling. Electronically Signed   By: Agustin Cree M.D.   On: 06/16/2023 17:50    ASSESSMENT & PLAN Joseph Rangel 77 y.o. male with medical history significant for metastatic prostate cancer admitted with chief complaint of dizziness and falls.  He was found to have a soft tissue swelling of the elbow, no fractures.  He was also found to be severely hypokalemic which was aggressively replaced and he feels better.  It is anticipated that he will likely go to a SNF for couple weeks.  He is overdue for his Lupron hence we have discussed about the administering Lupron 7.5 mg dose today and follow-up in the outpatient clinic in 28 days to resume his outpatient treatment.    He should also continue on Zytiga 1000mg  PO daily with prednisone.  I have clearly asked him to ask his family members to bring the medication Zytiga to the clinic as well as a skilled nursing facility so patient can resume his own medication. Lupron every 3 months, he has missed a couple doses.  He is noncompliant, frequently no-shows to the  appointment and forgets about his appointments.  Glad we were able to give him the 7.5 mg dose today.  I hope we can resume his Lupron in about 4 weeks from now. He did not have any bone metastasis hence he is not on bisphosphonate therapy.    # Normocytic normochromic anemia,  Hemoglobin low at 7.6 g/dL, can be managed outpatient.  No indication for blood transfusion at this time.  He received IV iron in the past.  # Hypokalemia potassium replaced and at normal limit today.  He was previously counseled on taking potassium supplements on multiple occasions but he fails to.    All questions were answered. The patient knows to call the clinic with any problems, questions or concerns.  A total of more than 25 minutes were spent on this encounter with face-to-face time and non-face-to-face time, including preparing to see the patient, ordering tests and/or medications, counseling the patient and coordination of care as outlined above.       06/21/2023 4:58 PM

## 2023-06-21 NOTE — Progress Notes (Signed)
[facility]                                                                              Joseph Rangel, is a 77 y.o. male, DOB - October 08, 1945, XBM:[id number] Admit date - 06/16/2023    Outpatient Primary MD for the patient is Joseph Mires, MD  LOS - 4  days  Chief Complaint  Patient presents with   Weakness   Fall   Arm Injury       Brief summary   Patient is a 77 year old male with HTN, HLP, GERD, seizure disorder, metastatic prostate CA with spread to intra-abdominal lymph nodes on daily Zytiga, prednisone, Lupron every 3 months, B12 deficiency, chronic hypokalemia on oral potassium presented with dizziness and falling episodes at home.  Per patient, he normally uses a cane and a walker at home at baseline however has been having issues with gait instability for the past week, fallen x 2 at home.  Recently fell a day before the admission on his left side with pain around his left elbow.  He also reported polyuria but no dysuria. In ED, mild tachycardia but no hypoxia.  Mild hyponatremia, severe hypokalemia 2.2.  EKG showed prolonged QTc. Left elbow x-ray diffuse soft tissue swelling, no fracture or dislocation MRI brain showed no acute findings, postop changes from prior right-sided craniotomy, age-related atrophy. Patient was admitted for further workup.   Assessment & Plan    Principal Problem: Severe hypokalemia -Potassium 2.2 on admission, aggressively replaced IV and PO  -Noted patient is on hydrochlorothiazide at home, will need to discontinue it at discharge -Aggressively replaced, potassium now stable 3.5   Active Problems:  Left elbow pain/cellulitis, swelling -Status post fall, x-ray negative for any fracture or dislocation -venous duplex negative for SVT or DVT -Patient was placed on IV Ancef upon admission, currently on IV vancomycin and cefepime -ESR 75, CRP 21.6 however uric acid level 3.4 -Pain and swelling currently improving, try prednisone  40 mg daily for 3 days (hold 5 mg prednisone maintenance dose)    Prolonged QTc -QTc on admission 530, improved to  QTc 467 -Avoid QT prolonging drugs - keep K ~4, Mag ~2    Generalized weakness   Fall at home -MRI brain showed no acute stroke, likely in the setting of hyponatremia, hypokalemia -B12 within normal limits, TSH 1.2, replace electrolytes.  -PT evaluation recommended SNF   Hyponatremia -Stable, asymptomatic -Improving  Essential hypertension -Continue atenolol, irbesartan -Discontinue HCTZ at discharge    Prostate cancer metastatic to intraabdominal lymph node (HCC) -On Zytiga, prednisone, Lupron every 3 months -Follows Dr. Al Rangel, last seen in 02/2023 -Plan for Lupron dose today per Dr Joseph Rangel recommendations -Currently on prednisone 5 mg daily, will hold while patient completing prednisone 40 mg daily for 3 doses, then resume maintenance dose  Chronic normocytic normochromic anemia -Hemoglobin 7.6, baseline 8-9 -Will check anemia panel  Seizure disorder -Continue Dilantin  GERD Continue PPI  Estimated body mass index is 23.48 kg/m as calculated from the following:   Height as of this encounter: 5\' 9"  (1.753 m).   Weight as of this encounter: 72.1 kg.  Code Status:  Full code DVT Prophylaxis:  enoxaparin (LOVENOX) injection 40 mg Start: 06/17/23 1000 SCDs Start: 06/16/23 2201   Level of Care: Level of care: Telemetry Family Communication: Updated patient's daughter Joseph Rangel on the phone Disposition Plan:      Remains inpatient appropriate: Possible DC to SNF in a.m. Procedures:  None  Consultants:   None  Antimicrobials:   Anti-infectives (From admission, onward)    Start     Dose/Rate Route Frequency Ordered Stop   06/20/23 1600  vancomycin (VANCOREADY) IVPB 1500 mg/300 mL        1,500 mg 150 mL/hr over 120 Minutes Intravenous Every 24 hours 06/19/23 1356 06/25/23 1959   06/19/23 2200  ceFEPIme (MAXIPIME) 2 g in sodium chloride 0.9 %  100 mL IVPB        2 g 200 mL/hr over 30 Minutes Intravenous Every 8 hours 06/19/23 1309 06/26/23 1159   06/19/23 1345  vancomycin (VANCOREADY) IVPB 1500 mg/300 mL        1,500 mg 150 mL/hr over 120 Minutes Intravenous  Once 06/19/23 1254 06/19/23 1912   06/19/23 1315  ceFEPIme (MAXIPIME) 2 g in sodium chloride 0.9 % 100 mL IVPB        2 g 200 mL/hr over 30 Minutes Intravenous  Once 06/19/23 1254 06/19/23 1557   06/16/23 2215  ceFAZolin (ANCEF) IVPB 1 g/50 mL premix  Status:  Discontinued        1 g 100 mL/hr over 30 Minutes Intravenous Every 8 hours 06/16/23 2204 06/19/23 1254          Medications  atenolol  25 mg Oral Daily   vitamin B-12  1,000 mcg Oral Daily   enoxaparin (LOVENOX) injection  40 mg Subcutaneous Q24H   irbesartan  300 mg Oral Daily   pantoprazole  40 mg Oral Daily   phenytoin  300 mg Oral Daily   predniSONE  5 mg Oral Q breakfast      Subjective:   Joseph Rangel was seen and examined today.  Overall improving, left-sided arm swelling, pain is improving.  No nausea vomiting, chest pain or shortness of breath.  Objective:   Vitals:   06/20/23 2139 06/21/23 0621 06/21/23 1011 06/21/23 1335  BP: (!) 153/88 (!) 165/76 (!) 142/85 (!) 120/96  Pulse: 80 80 90 64  Resp: 16   16  Temp: 98.6 F (37 C) 98.4 F (36.9 C)  98.2 F (36.8 C)  TempSrc: Oral Oral  Oral  SpO2: 100% 99%  100%  Weight:      Height:        Intake/Output Summary (Last 24 hours) at 06/21/2023 1515 Last data filed at 06/21/2023 1000 Gross per 24 hour  Intake 520 ml  Output 650 ml  Net -130 ml     Wt Readings from Last 3 Encounters:  06/16/23 72.1 kg  02/25/23 73.2 kg  12/03/22 75.4 kg     Physical Exam General: Alert and oriented x 3, NAD, ill-appearing Cardiovascular: S1 S2 clear, RRR.  Respiratory: CTAB Gastrointestinal: Soft, nontender, nondistended, NBS Ext: no lower extremity edema bilaterally Neuro: no new deficits Skin: Left elbow/arm swollen but improving from  yesterday. Psych: Normal affect     Data Reviewed:  I have personally reviewed following labs    CBC Lab Results  Component Value Date   WBC 6.1 06/21/2023   RBC 2.54 (L) 06/21/2023   HGB 7.6 (L) 06/21/2023   HCT 23.5 (L) 06/21/2023   MCV 92.5 06/21/2023   MCH 29.9  06/21/2023   PLT 221 06/21/2023   MCHC 32.3 06/21/2023   RDW 12.9 06/21/2023   LYMPHSABS 2.0 02/25/2023   MONOABS 0.7 02/25/2023   EOSABS 0.2 02/25/2023   BASOSABS 0.1 02/25/2023     Last metabolic panel Lab Results  Component Value Date   NA 133 (L) 06/21/2023   K 3.5 06/21/2023   CL 104 06/21/2023   CO2 21 (L) 06/21/2023   BUN 19 06/21/2023   CREATININE 1.02 06/21/2023   GLUCOSE 109 (H) 06/21/2023   GFRNONAA >60 06/21/2023   GFRAA >90 02/25/2012   CALCIUM 7.8 (L) 06/21/2023   PHOS 2.7 06/17/2023   PROT 7.6 02/25/2023   ALBUMIN 4.2 02/25/2023   LABGLOB 3.4 08/09/2020   AGRATIO 1.1 08/09/2020   BILITOT 0.4 02/25/2023   ALKPHOS 166 (H) 02/25/2023   AST 11 (L) 02/25/2023   ALT 5 02/25/2023   ANIONGAP 8 06/21/2023    CBG (last 3)  No results for input(s): "GLUCAP" in the last 72 hours.    Coagulation Profile: No results for input(s): "INR", "PROTIME" in the last 168 hours.   Radiology Studies: I have personally reviewed the imaging studies  No results found.     Thad Ranger M.D. [facility] 06/21/2023, 3:15 PM  Available via Epic secure chat 7am-7pm After 7 pm, please refer to night coverage provider listed on amion.

## 2023-06-21 NOTE — TOC Progression Note (Addendum)
Transition of Care [facility]) - Progression Note    Patient Details  Name: Joseph Rangel MRN: [id number] Date of Birth: Jan 04, 1946  Transition of [facility]) CM/SW Contact  Joseph Rangel, [place] Phone Number: 06/21/2023, 2:39 PM  Clinical Narrative:     CSW spoke to patient regarding SNF recommendations.  Patient requested that CSW speak to his wife to discuss SNF options.  CSW spoke to patient's wife, and she requested [facility].  CSW contacted [facility], and they can accept, CSW to start insurance auth.  2:55pm CSW received phone call from patient's daughter Joseph Rangel, [phone], who stated that she spoke to patient's wife, and they have decided [name] instead.  CSW contacted [facility] to make sure [facility] can accept patient, [facility] will review and confirm.  CSW to start insurance authorization once confirmation of SNF acceptance has been received.  Per patient's daughter, he is normally able to ambulate, dress, use the bathroom, and feed himself prior to hospitalization.  Patient lives with his wife, but his daughter assists patient and his wife as needed.  3;15pm [facility] confirmed they can accept patient tomorrow if he is medically ready and insurance authorization has been received.  CSW to submit for insurance authorization.  5:25pm CSW submitted for insurance authorization.  Patient has been approved with a start date of 06/22/2023, reference number [id number], next review 06/24/2023.  CSW updated [facility] at [facility], bedside nurse, and attending physician.  Per [facility], patient needs to receive the Lupron prior to discharge.  Attending physician, pharmacist, and oncologist agreed to provide a 1 month dose to patient prior to discharge.  Per Oncology, patient will have to return to the clinic in 4 weeks to get next scheduled injection.  CSW requested to make sure the follow up appointment is added to patient's AVS prior to him discharging to SNF.     Expected Discharge Plan:   (TBD) Barriers to Discharge: Continued Medical Work up  Expected Discharge Plan and Services    SNF for short term rehab.                                           Social Determinants of Health (SDOH) Interventions SDOH Screenings   Food Insecurity: No Food Insecurity (06/17/2023)  Housing: Low Risk  (06/17/2023)  Transportation Needs: No Transportation Needs (06/17/2023)  Utilities: Not At Risk (06/17/2023)  Tobacco Use: High Risk (06/16/2023)    Readmission Risk Interventions    06/17/2023    3:29 PM  Readmission Risk Prevention Plan  Transportation Screening Complete  PCP or Specialist Appt within 5-7 Days Complete  Home Care Screening Complete  Medication Review (RN CM) Complete

## 2023-06-21 NOTE — Plan of Care (Signed)

## 2023-06-21 NOTE — Plan of Care (Signed)

## 2023-06-22 DIAGNOSIS — R531 Weakness: Secondary | ICD-10-CM | POA: Diagnosis not present

## 2023-06-22 DIAGNOSIS — E876 Hypokalemia: Secondary | ICD-10-CM | POA: Diagnosis not present

## 2023-06-22 DIAGNOSIS — R9431 Abnormal electrocardiogram [ECG] [EKG]: Secondary | ICD-10-CM | POA: Diagnosis not present

## 2023-06-22 DIAGNOSIS — E871 Hypo-osmolality and hyponatremia: Secondary | ICD-10-CM | POA: Diagnosis not present

## 2023-06-22 LAB — HEPATIC FUNCTION PANEL
ALT: 7 U/L (ref 0–44)
AST: 16 U/L (ref 15–41)
Albumin: 2.5 g/dL — ABNORMAL LOW (ref 3.5–5.0)
Alkaline Phosphatase: 81 U/L (ref 38–126)
Bilirubin, Direct: <0.1 mg/dL (ref 0.0–0.2)
Total Bilirubin: 0.4 mg/dL (ref 0.3–1.2)
Total Protein: 6.1 g/dL — ABNORMAL LOW (ref 6.5–8.1)

## 2023-06-22 LAB — BASIC METABOLIC PANEL
Anion gap: 8 (ref 5–15)
BUN: 19 mg/dL (ref 8–23)
CO2: 19 mmol/L — ABNORMAL LOW (ref 22–32)
Calcium: 7.9 mg/dL — ABNORMAL LOW (ref 8.9–10.3)
Chloride: 105 mmol/L (ref 98–111)
Creatinine, Ser: 1.22 mg/dL (ref 0.61–1.24)
GFR, Estimated: >60 mL/min (ref >60)
Glucose, Bld: 122 mg/dL — ABNORMAL HIGH (ref 70–99)
Potassium: 3.5 mmol/L (ref 3.5–5.1)
Sodium: 132 mmol/L — ABNORMAL LOW (ref 135–145)

## 2023-06-22 LAB — CBC
HCT: 25.2 % — ABNORMAL LOW (ref 39.0–52.0)
Hemoglobin: 8 g/dL — ABNORMAL LOW (ref 13.0–17.0)
MCH: 29.5 pg (ref 26.0–34.0)
MCHC: 31.7 g/dL (ref 30.0–36.0)
MCV: 93 fL (ref 80.0–100.0)
Platelets: 242 10*3/uL (ref 150–400)
RBC: 2.71 MIL/uL — ABNORMAL LOW (ref 4.22–5.81)
RDW: 12.9 % (ref 11.5–15.5)
WBC: 6 10*3/uL (ref 4.0–10.5)
nRBC: 0 % (ref 0.0–0.2)

## 2023-06-22 LAB — OCCULT BLOOD X 1 CARD TO LAB, STOOL: Fecal Occult Bld: POSITIVE — AB

## 2023-06-22 MED ORDER — PREDNISONE 20 MG PO TABS: 40.0000 mg | ORAL_TABLET | Freq: Every day | ORAL | Status: DC

## 2023-06-22 MED ORDER — POTASSIUM CHLORIDE CRYS ER 20 MEQ PO TBCR: 20.0000 meq | EXTENDED_RELEASE_TABLET | Freq: Every day | ORAL | Status: DC

## 2023-06-22 MED ORDER — ACETAMINOPHEN 325 MG PO TABS: 650.0000 mg | ORAL_TABLET | Freq: Four times a day (QID) | ORAL | Status: AC | PRN

## 2023-06-22 MED ORDER — HYDROCODONE-ACETAMINOPHEN 10-325 MG PO TABS: 1.0000 | ORAL_TABLET | Freq: Four times a day (QID) | ORAL | 0 refills | Status: DC | PRN

## 2023-06-22 MED ORDER — PREDNISONE 20 MG PO TABS: 40.0000 mg | ORAL_TABLET | Freq: Every day | ORAL | Status: AC

## 2023-06-22 MED ORDER — CYANOCOBALAMIN 1000 MCG PO TABS: 1000.0000 ug | ORAL_TABLET | Freq: Every day | ORAL | Status: AC

## 2023-06-22 MED ORDER — SENNOSIDES-DOCUSATE SODIUM 8.6-50 MG PO TABS: 1.0000 | ORAL_TABLET | Freq: Every evening | ORAL | Status: AC | PRN

## 2023-06-22 MED ORDER — DOXYCYCLINE HYCLATE 100 MG PO CAPS: 100.0000 mg | ORAL_CAPSULE | Freq: Two times a day (BID) | ORAL | Status: AC

## 2023-06-22 MED ORDER — SODIUM CHLORIDE 0.9 % IV SOLN: 2.0000 g | Freq: Two times a day (BID) | INTRAVENOUS | Status: DC

## 2023-06-22 MED ORDER — PREDNISONE 5 MG PO TABS: 5.0000 mg | ORAL_TABLET | Freq: Every day | ORAL | Status: DC

## 2023-06-22 MED ORDER — VANCOMYCIN HCL 1250 MG/250ML IV SOLN
1250.0000 mg | INTRAVENOUS | Status: DC
  Filled 2023-06-22: qty 250

## 2023-06-22 NOTE — Plan of Care (Signed)

## 2023-06-22 NOTE — Discharge Summary (Signed)
tablet (5 mg total) by mouth daily with breakfast. Start taking on: June 24, 2023 What changed:  See the new instructions. These instructions start on June 24, 2023. If you are unsure what to do until then, ask your doctor or other care provider.   senna-docusate 8.6-50 MG tablet Commonly known as: Senokot-S Take 1 tablet by mouth at bedtime as needed for mild constipation.   verapamil 360 MG 24 hr capsule Commonly known as: VERELAN Take 360 mg by mouth daily.        Follow-up Information     [name], MD. Schedule an appointment as soon as possible for a visit in 2 week(s).   Specialty: Family Medicine Why: for hospital follow-up Contact information: [address] [place] [address] [phone]         Rachel Moulds, MD Follow up on 07/13/2023.   Specialty: Hematology  and Oncology Why: at 2:30 PM, for hospital follow-up and Lupron injection Contact information: [address] [place] [address] [phone]                Discharge Exam: Ceasar Mons Weights   06/16/23 1517  Weight: 72.1 kg   S: Sitting in the chair, eating breakfast without any difficulty.  Looking forward to discharge today.  He received Lupron injection yesterday, no acute issues today.  Left elbow swelling improving, no pain.  BP (!) 165/78   Pulse 69   Temp 97.6 F (36.4 C) (Oral)   Resp 20   Ht 5\' 9"  (1.753 m)   Wt 72.1 kg   SpO2 100%   BMI 23.48 kg/m   Physical Exam General: Alert and oriented x 3, NAD Cardiovascular: S1 S2 clear, RRR.  Respiratory: CTAB, no wheezing, rales or rhonchi Gastrointestinal: Soft, nontender, nondistended, NBS Ext: no pedal edema bilaterally Neuro: no new deficits Skin: Left elbow swelling improving, no tenderness Psych: Normal affect    Condition at discharge: fair  The results of significant diagnostics from this hospitalization (including imaging, microbiology, ancillary and laboratory) are listed below for reference.   Imaging Studies: VAS [place] UPPER EXTREMITY VENOUS DUPLEX  Result Date: 06/19/2023 UPPER VENOUS STUDY  Patient Name:  Joseph Rangel  Date of Exam:   06/19/2023 Medical Rec #: [id number]     Accession #:    [id number] Date of Birth: September 12, 1946      Patient Gender: M Patient Age:   77 years Exam Location:  [facility] Procedure:      VAS [place] UPPER EXTREMITY VENOUS DUPLEX Referring Phys: Marlin Canary --------------------------------------------------------------------------------  Indications: Swelling Risk Factors: None identified. Comparison Study: No prior study Performing Technologist: Shona Simpson  Examination Guidelines: A complete evaluation includes B-mode imaging, spectral Doppler, color Doppler, and power Doppler as needed of all accessible portions of each vessel. Bilateral testing is considered an integral  part of a complete examination. Limited examinations for reoccurring indications may be performed as noted.  Right Findings: +----------+------------+---------+-----------+----------+-------+ RIGHT     CompressiblePhasicitySpontaneousPropertiesSummary +----------+------------+---------+-----------+----------+-------+ Subclavian    Full       Yes       Yes                      +----------+------------+---------+-----------+----------+-------+  Left Findings: +----------+------------+---------+-----------+----------+-------+ LEFT      CompressiblePhasicitySpontaneousPropertiesSummary +----------+------------+---------+-----------+----------+-------+ IJV           Full       Yes       Yes                      +----------+------------+---------+-----------+----------+-------+  tablet (5 mg total) by mouth daily with breakfast. Start taking on: June 24, 2023 What changed:  See the new instructions. These instructions start on June 24, 2023. If you are unsure what to do until then, ask your doctor or other care provider.   senna-docusate 8.6-50 MG tablet Commonly known as: Senokot-S Take 1 tablet by mouth at bedtime as needed for mild constipation.   verapamil 360 MG 24 hr capsule Commonly known as: VERELAN Take 360 mg by mouth daily.        Follow-up Information     [name], MD. Schedule an appointment as soon as possible for a visit in 2 week(s).   Specialty: Family Medicine Why: for hospital follow-up Contact information: [address] [place] [address] [phone]         Rachel Moulds, MD Follow up on 07/13/2023.   Specialty: Hematology  and Oncology Why: at 2:30 PM, for hospital follow-up and Lupron injection Contact information: [address] [place] [address] [phone]                Discharge Exam: Ceasar Mons Weights   06/16/23 1517  Weight: 72.1 kg   S: Sitting in the chair, eating breakfast without any difficulty.  Looking forward to discharge today.  He received Lupron injection yesterday, no acute issues today.  Left elbow swelling improving, no pain.  BP (!) 165/78   Pulse 69   Temp 97.6 F (36.4 C) (Oral)   Resp 20   Ht 5\' 9"  (1.753 m)   Wt 72.1 kg   SpO2 100%   BMI 23.48 kg/m   Physical Exam General: Alert and oriented x 3, NAD Cardiovascular: S1 S2 clear, RRR.  Respiratory: CTAB, no wheezing, rales or rhonchi Gastrointestinal: Soft, nontender, nondistended, NBS Ext: no pedal edema bilaterally Neuro: no new deficits Skin: Left elbow swelling improving, no tenderness Psych: Normal affect    Condition at discharge: fair  The results of significant diagnostics from this hospitalization (including imaging, microbiology, ancillary and laboratory) are listed below for reference.   Imaging Studies: VAS [place] UPPER EXTREMITY VENOUS DUPLEX  Result Date: 06/19/2023 UPPER VENOUS STUDY  Patient Name:  Joseph Rangel  Date of Exam:   06/19/2023 Medical Rec #: [id number]     Accession #:    [id number] Date of Birth: September 12, 1946      Patient Gender: M Patient Age:   77 years Exam Location:  [facility] Procedure:      VAS [place] UPPER EXTREMITY VENOUS DUPLEX Referring Phys: Marlin Canary --------------------------------------------------------------------------------  Indications: Swelling Risk Factors: None identified. Comparison Study: No prior study Performing Technologist: Shona Simpson  Examination Guidelines: A complete evaluation includes B-mode imaging, spectral Doppler, color Doppler, and power Doppler as needed of all accessible portions of each vessel. Bilateral testing is considered an integral  part of a complete examination. Limited examinations for reoccurring indications may be performed as noted.  Right Findings: +----------+------------+---------+-----------+----------+-------+ RIGHT     CompressiblePhasicitySpontaneousPropertiesSummary +----------+------------+---------+-----------+----------+-------+ Subclavian    Full       Yes       Yes                      +----------+------------+---------+-----------+----------+-------+  Left Findings: +----------+------------+---------+-----------+----------+-------+ LEFT      CompressiblePhasicitySpontaneousPropertiesSummary +----------+------------+---------+-----------+----------+-------+ IJV           Full       Yes       Yes                      +----------+------------+---------+-----------+----------+-------+  Physician Discharge Summary   Patient: Joseph Rangel MRN: [id number] DOB: April 14, 1946  Admit date:     06/16/2023  Discharge date: 06/22/23  Discharge Physician: Thad Ranger, MD    PCP: [name], MD   Recommendations at discharge:   Continue doxycycline 100 mg twice daily for 4 more days Continue prednisone 40 mg daily for 1 more day, then resume daily maintenance dose of prednisone 5 mg daily starting 06/24/2023 Patient received Lupron injection on 06/21/2023 while inpatient.  He will need to resume his Lupron in about 4 weeks from now, he has an appointment on 07/13/2023 with his oncologist, Dr. Al Pimple.  Please check BMET in 1 week. Patient had severe hypokalemia on admission, HCTZ has been discontinued  Continue K-Dur 20 meq p.o. daily  Discharge Diagnoses:    Severe hypokalemia   Prostate cancer metastatic to intraabdominal lymph node (HCC)   Normocytic normochromic anemia   Hyponatremia   Generalized weakness   Fall at home, initial encounter Left elbow cellulitis   Hospital Course:  Patient is a 77 year old male with HTN, HLP, GERD, seizure disorder, metastatic prostate CA with spread to intra-abdominal lymph nodes on daily Zytiga, prednisone, Lupron every 3 months, B12 deficiency, chronic hypokalemia on oral potassium presented with dizziness and falling episodes at home.  Per patient, he normally uses a cane and a walker at home at baseline however has been having issues with gait instability for the past week, fallen x 2 at home.  Recently fell a day before the admission on his left side with pain around his left elbow.  He also reported polyuria but no dysuria. In ED, mild tachycardia but no hypoxia.  Mild hyponatremia, severe hypokalemia 2.2.  EKG showed prolonged QTc. Left elbow x-ray diffuse soft tissue swelling, no fracture or dislocation MRI brain showed no acute findings, postop changes from prior right-sided craniotomy, age-related atrophy. Patient was admitted for  further workup.   Assessment and Plan:  Severe hypokalemia -Potassium 2.2 on admission, aggressively replaced IV and PO  -Noted patient is on hydrochlorothiazide at home, discontinued it at discharge -Aggressively replaced, potassium now stable 3.5 at discharge -Please recheck bmet in 1 week.  Continue potassium 20 meq p.o. daily    Left elbow pain/cellulitis, swelling -Status post fall, x-ray negative for any fracture or dislocation -venous duplex negative for SVT or DVT -Patient was placed on IV Ancef upon admission, subsequently on IV vancomycin and cefepime -ESR 75, CRP 21.6 however uric acid level 3.4 -Pain and swelling currently improving, continue prednisone 40 mg daily for 1 more day and then resume a maintenance dose of prednisone 5 mg daily on 06/24/2023  -Continue doxycycline 100 mg p.o. twice daily for 4 more days to complete full course   Prolonged QTc -QTc on admission 530, improved to  QTc 467 -Avoid QT prolonging drugs - keep K ~4, Mag ~2     Generalized weakness   Fall at home -MRI brain showed no acute stroke, likely in the setting of hyponatremia, hypokalemia -B12 within normal limits, TSH 1.2 -PT evaluation recommended SNF     Hyponatremia -Stable, asymptomatic -Improving   Essential hypertension -Continue atenolol, irbesartan, verapamil -Discontinued HCTZ at discharge     Prostate cancer metastatic to intraabdominal lymph node (HCC) -On Zytiga, prednisone, Lupron every 3 months -Received Lupron 7.5 mg dose on 06/21/2023  -Seen by his oncologist, Dr. Al Pimple on 9/30, recommended to continue Zytiga daily with prednisone.  Recommended his family members to bring the medication to the clinic as  Physician Discharge Summary   Patient: Joseph Rangel MRN: [id number] DOB: April 14, 1946  Admit date:     06/16/2023  Discharge date: 06/22/23  Discharge Physician: Thad Ranger, MD    PCP: [name], MD   Recommendations at discharge:   Continue doxycycline 100 mg twice daily for 4 more days Continue prednisone 40 mg daily for 1 more day, then resume daily maintenance dose of prednisone 5 mg daily starting 06/24/2023 Patient received Lupron injection on 06/21/2023 while inpatient.  He will need to resume his Lupron in about 4 weeks from now, he has an appointment on 07/13/2023 with his oncologist, Dr. Al Pimple.  Please check BMET in 1 week. Patient had severe hypokalemia on admission, HCTZ has been discontinued  Continue K-Dur 20 meq p.o. daily  Discharge Diagnoses:    Severe hypokalemia   Prostate cancer metastatic to intraabdominal lymph node (HCC)   Normocytic normochromic anemia   Hyponatremia   Generalized weakness   Fall at home, initial encounter Left elbow cellulitis   Hospital Course:  Patient is a 77 year old male with HTN, HLP, GERD, seizure disorder, metastatic prostate CA with spread to intra-abdominal lymph nodes on daily Zytiga, prednisone, Lupron every 3 months, B12 deficiency, chronic hypokalemia on oral potassium presented with dizziness and falling episodes at home.  Per patient, he normally uses a cane and a walker at home at baseline however has been having issues with gait instability for the past week, fallen x 2 at home.  Recently fell a day before the admission on his left side with pain around his left elbow.  He also reported polyuria but no dysuria. In ED, mild tachycardia but no hypoxia.  Mild hyponatremia, severe hypokalemia 2.2.  EKG showed prolonged QTc. Left elbow x-ray diffuse soft tissue swelling, no fracture or dislocation MRI brain showed no acute findings, postop changes from prior right-sided craniotomy, age-related atrophy. Patient was admitted for  further workup.   Assessment and Plan:  Severe hypokalemia -Potassium 2.2 on admission, aggressively replaced IV and PO  -Noted patient is on hydrochlorothiazide at home, discontinued it at discharge -Aggressively replaced, potassium now stable 3.5 at discharge -Please recheck bmet in 1 week.  Continue potassium 20 meq p.o. daily    Left elbow pain/cellulitis, swelling -Status post fall, x-ray negative for any fracture or dislocation -venous duplex negative for SVT or DVT -Patient was placed on IV Ancef upon admission, subsequently on IV vancomycin and cefepime -ESR 75, CRP 21.6 however uric acid level 3.4 -Pain and swelling currently improving, continue prednisone 40 mg daily for 1 more day and then resume a maintenance dose of prednisone 5 mg daily on 06/24/2023  -Continue doxycycline 100 mg p.o. twice daily for 4 more days to complete full course   Prolonged QTc -QTc on admission 530, improved to  QTc 467 -Avoid QT prolonging drugs - keep K ~4, Mag ~2     Generalized weakness   Fall at home -MRI brain showed no acute stroke, likely in the setting of hyponatremia, hypokalemia -B12 within normal limits, TSH 1.2 -PT evaluation recommended SNF     Hyponatremia -Stable, asymptomatic -Improving   Essential hypertension -Continue atenolol, irbesartan, verapamil -Discontinued HCTZ at discharge     Prostate cancer metastatic to intraabdominal lymph node (HCC) -On Zytiga, prednisone, Lupron every 3 months -Received Lupron 7.5 mg dose on 06/21/2023  -Seen by his oncologist, Dr. Al Pimple on 9/30, recommended to continue Zytiga daily with prednisone.  Recommended his family members to bring the medication to the clinic as  tablet (5 mg total) by mouth daily with breakfast. Start taking on: June 24, 2023 What changed:  See the new instructions. These instructions start on June 24, 2023. If you are unsure what to do until then, ask your doctor or other care provider.   senna-docusate 8.6-50 MG tablet Commonly known as: Senokot-S Take 1 tablet by mouth at bedtime as needed for mild constipation.   verapamil 360 MG 24 hr capsule Commonly known as: VERELAN Take 360 mg by mouth daily.        Follow-up Information     [name], MD. Schedule an appointment as soon as possible for a visit in 2 week(s).   Specialty: Family Medicine Why: for hospital follow-up Contact information: [address] [place] [address] [phone]         Rachel Moulds, MD Follow up on 07/13/2023.   Specialty: Hematology  and Oncology Why: at 2:30 PM, for hospital follow-up and Lupron injection Contact information: [address] [place] [address] [phone]                Discharge Exam: Ceasar Mons Weights   06/16/23 1517  Weight: 72.1 kg   S: Sitting in the chair, eating breakfast without any difficulty.  Looking forward to discharge today.  He received Lupron injection yesterday, no acute issues today.  Left elbow swelling improving, no pain.  BP (!) 165/78   Pulse 69   Temp 97.6 F (36.4 C) (Oral)   Resp 20   Ht 5\' 9"  (1.753 m)   Wt 72.1 kg   SpO2 100%   BMI 23.48 kg/m   Physical Exam General: Alert and oriented x 3, NAD Cardiovascular: S1 S2 clear, RRR.  Respiratory: CTAB, no wheezing, rales or rhonchi Gastrointestinal: Soft, nontender, nondistended, NBS Ext: no pedal edema bilaterally Neuro: no new deficits Skin: Left elbow swelling improving, no tenderness Psych: Normal affect    Condition at discharge: fair  The results of significant diagnostics from this hospitalization (including imaging, microbiology, ancillary and laboratory) are listed below for reference.   Imaging Studies: VAS [place] UPPER EXTREMITY VENOUS DUPLEX  Result Date: 06/19/2023 UPPER VENOUS STUDY  Patient Name:  Joseph Rangel  Date of Exam:   06/19/2023 Medical Rec #: [id number]     Accession #:    [id number] Date of Birth: September 12, 1946      Patient Gender: M Patient Age:   77 years Exam Location:  [facility] Procedure:      VAS [place] UPPER EXTREMITY VENOUS DUPLEX Referring Phys: Marlin Canary --------------------------------------------------------------------------------  Indications: Swelling Risk Factors: None identified. Comparison Study: No prior study Performing Technologist: Shona Simpson  Examination Guidelines: A complete evaluation includes B-mode imaging, spectral Doppler, color Doppler, and power Doppler as needed of all accessible portions of each vessel. Bilateral testing is considered an integral  part of a complete examination. Limited examinations for reoccurring indications may be performed as noted.  Right Findings: +----------+------------+---------+-----------+----------+-------+ RIGHT     CompressiblePhasicitySpontaneousPropertiesSummary +----------+------------+---------+-----------+----------+-------+ Subclavian    Full       Yes       Yes                      +----------+------------+---------+-----------+----------+-------+  Left Findings: +----------+------------+---------+-----------+----------+-------+ LEFT      CompressiblePhasicitySpontaneousPropertiesSummary +----------+------------+---------+-----------+----------+-------+ IJV           Full       Yes       Yes                      +----------+------------+---------+-----------+----------+-------+

## 2023-06-22 NOTE — TOC Transition Note (Signed)
Transition of Care [facility]) - CM/SW Discharge Note   Patient Details  Name: Joseph Rangel MRN: [id number] Date of Birth: [birth year]  [facility] CM/SW Contact:  Lanier Clam, RN Phone Number: 06/22/2023, 11:47 AM   Clinical Narrative:   Iline Oven per prior TOC ntoe. Confirmed accepted by [facility] rep Kia-aware of d/c summary. Going to rm#[id number], report tel#[phone].[facility] called. No further CM needs.    Final next level of care: Skilled Nursing Facility Barriers to Discharge: No Barriers Identified   Patient Goals and CMS Choice CMS Medicare.gov Compare Post Acute Care list provided to:: Patient Represenative (must comment)    Discharge Placement PASRR number recieved: 06/21/23 PASRR number recieved: 06/21/23            Patient chooses bed at: [facility] Patient to be transferred to facility by: [facility] Name of family member notified: Shanda/Latisha (daughters) Patient and family notified of of transfer: 06/22/23  Discharge Plan and Services Additional resources added to the After Visit Summary for                                       Social Determinants of Health (SDOH) Interventions SDOH Screenings   Food Insecurity: No Food Insecurity (06/17/2023)  Housing: Low Risk  (06/17/2023)  Transportation Needs: No Transportation Needs (06/17/2023)  Utilities: Not At Risk (06/17/2023)  Tobacco Use: High Risk (06/16/2023)     Readmission Risk Interventions    06/17/2023    3:29 PM  Readmission Risk Prevention Plan  Transportation Screening Complete  PCP or Specialist Appt within 5-7 Days Complete  Home Care Screening Complete  Medication Review (RN CM) Complete

## 2023-06-22 NOTE — Progress Notes (Signed)
Pharmacy Antibiotic Note  Joseph Rangel is a 77 y.o. male admitted on 06/16/2023 with cellulitis.  Pharmacy has been consulted for Vancomycin and Cefepime dosing.  Today is day #4 of escalated abx.  Afebrile, WBC wnl, SCr increased to 1.22.   Plan: Decrease to Cefepime 2g IV q12h  Decreased to Vancomycin 1250 mg IV q24h  Measure Vanc levels as needed.  Goal AUC = 400 - 550 Follow up renal function, culture results, and clinical course.   Height: 5\' 9"  (175.3 cm) Weight: 72.1 kg (159 lb) IBW/kg (Calculated) : 70.7  Temp (24hrs), Avg:97.9 F (36.6 C), Min:97.6 F (36.4 C), Max:98.2 F (36.8 C)  Recent Labs  Lab 06/18/23 0511 06/19/23 0540 06/20/23 0536 06/21/23 0253 06/22/23 0532  WBC 8.0 7.5 6.9 6.1 6.0  CREATININE 1.08 0.98 0.86 1.02 1.22    Estimated Creatinine Clearance: 50.7 mL/min (by C-G formula based on SCr of 1.22 mg/dL).    No Known Allergies  Antimicrobials this admission: 9/26 Cefazolin >> 9/28 9/28 Vancomycin >> (10/4) 9/28 Cefepime >> (10/4)  Dose adjustments this admission: 10/1 SCr increased, doses empirically reduced for renal function  Microbiology results: none  Thank you for allowing pharmacy to be a part of this patient's care.   Lynann Beaver PharmD, BCPS [facility] main pharmacy [phone] 06/22/2023 7:59 AM

## 2023-06-22 NOTE — Progress Notes (Signed)
Patient discharged to SNF via ambulance. Patient alert and oriented, denies any pain or distress. No pressure injury noted. Patient spoke to wife when Sharin Mons was picking him up. Patient home medication returned to patient, given to PT[facility] driver for patient rehab facility.

## 2023-06-22 NOTE — Care Management Important Message (Signed)
Important Message  Patient Details IM Letter given. Name: Joseph Rangel MRN: [id number] Date of Birth: [birth year]   Important Message Given:  Yes - Medicare IM     Caren Macadam 06/22/2023, 10:49 AM

## 2023-06-22 NOTE — Progress Notes (Signed)
Report called to Joseph Rangel- LPN at [facility]- SNF. Waiting on [facility] for transportation.

## 2023-07-13 ENCOUNTER — Inpatient Hospital Stay: Payer: Medicare PPO | Attending: Hematology and Oncology | Admitting: Hematology and Oncology

## 2023-07-13 ENCOUNTER — Inpatient Hospital Stay: Payer: Medicare PPO

## 2023-07-13 DIAGNOSIS — E538 Deficiency of other specified B group vitamins: Secondary | ICD-10-CM | POA: Insufficient documentation

## 2023-07-13 DIAGNOSIS — Z7902 Long term (current) use of antithrombotics/antiplatelets: Secondary | ICD-10-CM | POA: Insufficient documentation

## 2023-07-13 DIAGNOSIS — Z79899 Other long term (current) drug therapy: Secondary | ICD-10-CM | POA: Insufficient documentation

## 2023-07-13 DIAGNOSIS — R569 Unspecified convulsions: Secondary | ICD-10-CM | POA: Insufficient documentation

## 2023-07-13 DIAGNOSIS — Z7952 Long term (current) use of systemic steroids: Secondary | ICD-10-CM | POA: Insufficient documentation

## 2023-07-13 DIAGNOSIS — R7989 Other specified abnormal findings of blood chemistry: Secondary | ICD-10-CM | POA: Insufficient documentation

## 2023-07-13 DIAGNOSIS — F17210 Nicotine dependence, cigarettes, uncomplicated: Secondary | ICD-10-CM | POA: Insufficient documentation

## 2023-07-13 DIAGNOSIS — R630 Anorexia: Secondary | ICD-10-CM | POA: Insufficient documentation

## 2023-07-13 DIAGNOSIS — I10 Essential (primary) hypertension: Secondary | ICD-10-CM | POA: Insufficient documentation

## 2023-07-13 DIAGNOSIS — D649 Anemia, unspecified: Secondary | ICD-10-CM | POA: Insufficient documentation

## 2023-07-13 DIAGNOSIS — C61 Malignant neoplasm of prostate: Secondary | ICD-10-CM | POA: Insufficient documentation

## 2023-07-13 DIAGNOSIS — R748 Abnormal levels of other serum enzymes: Secondary | ICD-10-CM | POA: Insufficient documentation

## 2023-07-19 ENCOUNTER — Inpatient Hospital Stay: Payer: Medicare PPO

## 2023-07-19 ENCOUNTER — Other Ambulatory Visit: Payer: Self-pay

## 2023-07-19 ENCOUNTER — Ambulatory Visit: Payer: Medicare PPO

## 2023-07-19 ENCOUNTER — Inpatient Hospital Stay (HOSPITAL_BASED_OUTPATIENT_CLINIC_OR_DEPARTMENT_OTHER): Payer: Medicare PPO | Admitting: Hematology and Oncology

## 2023-07-19 VITALS — BP 124/79 | HR 81 | Temp 97.2°F | Resp 17 | Wt 157.1 lb

## 2023-07-19 DIAGNOSIS — C772 Secondary and unspecified malignant neoplasm of intra-abdominal lymph nodes: Secondary | ICD-10-CM

## 2023-07-19 DIAGNOSIS — R7989 Other specified abnormal findings of blood chemistry: Secondary | ICD-10-CM | POA: Diagnosis not present

## 2023-07-19 DIAGNOSIS — E538 Deficiency of other specified B group vitamins: Secondary | ICD-10-CM | POA: Diagnosis not present

## 2023-07-19 DIAGNOSIS — C61 Malignant neoplasm of prostate: Secondary | ICD-10-CM | POA: Diagnosis present

## 2023-07-19 DIAGNOSIS — Z79899 Other long term (current) drug therapy: Secondary | ICD-10-CM | POA: Diagnosis not present

## 2023-07-19 DIAGNOSIS — Z7902 Long term (current) use of antithrombotics/antiplatelets: Secondary | ICD-10-CM | POA: Diagnosis not present

## 2023-07-19 DIAGNOSIS — D649 Anemia, unspecified: Secondary | ICD-10-CM | POA: Diagnosis not present

## 2023-07-19 DIAGNOSIS — R569 Unspecified convulsions: Secondary | ICD-10-CM | POA: Diagnosis not present

## 2023-07-19 DIAGNOSIS — R748 Abnormal levels of other serum enzymes: Secondary | ICD-10-CM | POA: Diagnosis not present

## 2023-07-19 DIAGNOSIS — I10 Essential (primary) hypertension: Secondary | ICD-10-CM | POA: Diagnosis not present

## 2023-07-19 DIAGNOSIS — F17210 Nicotine dependence, cigarettes, uncomplicated: Secondary | ICD-10-CM | POA: Diagnosis not present

## 2023-07-19 DIAGNOSIS — Z7952 Long term (current) use of systemic steroids: Secondary | ICD-10-CM | POA: Diagnosis not present

## 2023-07-19 DIAGNOSIS — R630 Anorexia: Secondary | ICD-10-CM | POA: Diagnosis not present

## 2023-07-19 LAB — CBC WITH DIFFERENTIAL/PLATELET
Abs Immature Granulocytes: 0.08 10*3/uL — ABNORMAL HIGH (ref 0.00–0.07)
Basophils Absolute: 0.1 10*3/uL (ref 0.0–0.1)
Basophils Relative: 1 %
Eosinophils Absolute: 0.3 10*3/uL (ref 0.0–0.5)
Eosinophils Relative: 3 %
HCT: 30.1 % — ABNORMAL LOW (ref 39.0–52.0)
Hemoglobin: 9.8 g/dL — ABNORMAL LOW (ref 13.0–17.0)
Immature Granulocytes: 1 %
Lymphocytes Relative: 20 %
Lymphs Abs: 2.1 10*3/uL (ref 0.7–4.0)
MCH: 29.5 pg (ref 26.0–34.0)
MCHC: 32.6 g/dL (ref 30.0–36.0)
MCV: 90.7 fL (ref 80.0–100.0)
Monocytes Absolute: 0.8 10*3/uL (ref 0.1–1.0)
Monocytes Relative: 8 %
Neutro Abs: 7.2 10*3/uL (ref 1.7–7.7)
Neutrophils Relative %: 67 %
Platelets: 182 10*3/uL (ref 150–400)
RBC: 3.32 MIL/uL — ABNORMAL LOW (ref 4.22–5.81)
RDW: 14.6 % (ref 11.5–15.5)
WBC: 10.5 10*3/uL (ref 4.0–10.5)
nRBC: 0 % (ref 0.0–0.2)

## 2023-07-19 LAB — COMPREHENSIVE METABOLIC PANEL
ALT: 7 U/L (ref 0–44)
AST: 10 U/L — ABNORMAL LOW (ref 15–41)
Albumin: 4.2 g/dL (ref 3.5–5.0)
Alkaline Phosphatase: 129 U/L — ABNORMAL HIGH (ref 38–126)
Anion gap: 10 (ref 5–15)
BUN: 19 mg/dL (ref 8–23)
CO2: 25 mmol/L (ref 22–32)
Calcium: 9.3 mg/dL (ref 8.9–10.3)
Chloride: 103 mmol/L (ref 98–111)
Creatinine, Ser: 1.46 mg/dL — ABNORMAL HIGH (ref 0.61–1.24)
GFR, Estimated: 49 mL/min — ABNORMAL LOW (ref >60)
Glucose, Bld: 154 mg/dL — ABNORMAL HIGH (ref 70–99)
Potassium: 3.7 mmol/L (ref 3.5–5.1)
Sodium: 138 mmol/L (ref 135–145)
Total Bilirubin: 0.3 mg/dL (ref 0.3–1.2)
Total Protein: 7.7 g/dL (ref 6.5–8.1)

## 2023-07-19 MED ORDER — LEUPROLIDE ACETATE (3 MONTH) 22.5 MG ~~LOC~~ KIT
22.5000 mg | PACK | Freq: Once | SUBCUTANEOUS | Status: AC
  Administered 2023-07-19: 22.5 mg via SUBCUTANEOUS
  Filled 2023-07-19: qty 22.5

## 2023-07-19 NOTE — Progress Notes (Signed)
[facility] Telephone:(336) [phone]   Fax:([phone]  PROGRESS NOTE  Patient Care Team: [name], MD as PCP - General (Family Medicine)  Hematological/Oncological History  # Metastatic Prostate Cancer w/ Spread to Intraabdominal Lymph Nodes --previously followed by Dr. Al Pimple. Currently on abiraterone therapy 06/18/2021: PSA <0.01  Interval History:   Joseph Rangel 77 y.o. male with medical history significant for metastatic prostate cancer who presents for a follow up visit.   Mr. Fudala is here for follow-up. Mr. Mcgugan had a fall since his last visit here and went to the nursing home and completed some physical therapy and rehabilitation.  He arrived today to the appointment in a wheelchair.  He once again tells me that he has no appetite.  He tells me he is taking all the medications but he is not sure of which medications.  Besides a lack of appetite, some knee issues, he denies any major complaints today.  He tells me he does not want to do the shot because it hurts but he is willing to do it if needed.  MEDICAL HISTORY:  Past Medical History:  Diagnosis Date   Arthritis    Cancer [facility])    prostate    Frequency of urination    Hypertension    Seizures (HCC)    after brain surg - none in past 20 yrs   Speech disorder    due to brain surg    SURGICAL HISTORY: Past Surgical History:  Procedure Laterality Date   BRAIN SURGERY     BLOOD CLOT REMOVED 1980   MASS EXCISION  02/24/2012   Procedure: MINOR EXCISION OF MASS;  Surgeon: Valetta Fuller, MD;  Location: [facility];  Service: Urology;;  excision of neck lesion   ROBOT ASSISTED LAPAROSCOPIC RADICAL PROSTATECTOMY  02/24/2012   Procedure: ROBOTIC ASSISTED LAPAROSCOPIC RADICAL PROSTATECTOMY;  Surgeon: Valetta Fuller, MD;  Location: [facility];  Service: Urology;  Laterality: N/A;           SOCIAL HISTORY: Social History   Socioeconomic History   Marital status: Married    Spouse name: Not on file    Number of children: Not on file   Years of education: Not on file   Highest education level: Not on file  Occupational History   Not on file  Tobacco Use   Smoking status: Every Day    Types: Cigarettes   Smokeless tobacco: Never   Tobacco comments:    2 cigarettes per day  Vaping Use   Vaping status: Never Used  Substance and Sexual Activity   Alcohol use: No   Drug use: No   Sexual activity: Not on file  Other Topics Concern   Not on file  Social History Narrative   Not on file   Social Determinants of Health   Financial Resource Strain: Not on file  Food Insecurity: No Food Insecurity (06/17/2023)   Hunger Vital Sign    Worried About Running Out of Food in the Last Year: Never true    Ran Out of Food in the Last Year: Never true  Transportation Needs: No Transportation Needs (06/17/2023)   PRAPARE - Administrator, Civil Service (Medical): No    Lack of Transportation (Non-Medical): No  Physical Activity: Not on file  Stress: Not on file  Social Connections: Not on file  Intimate Partner Violence: Not At Risk (06/17/2023)   Humiliation, Afraid, Rape, and Kick questionnaire    Fear of Current or Ex-Partner:  No    Emotionally Abused: No    Physically Abused: No    Sexually Abused: No    FAMILY HISTORY: No family history on file.  ALLERGIES:  has No Known Allergies.  MEDICATIONS:  Current Outpatient Medications  Medication Sig Dispense Refill   abiraterone acetate (ZYTIGA) 250 MG tablet TAKE 4 TABLETS (1000MG ) BY MOUTH DAILY ON AN EMPTY STOMACH, 1 HR BEFORE OR 2 HRS AFTER A MEAL 120 tablet 2   acetaminophen (TYLENOL) 325 MG tablet Take 2 tablets (650 mg total) by mouth every 6 (six) hours as needed for mild pain (or Fever >/= 101).     atenolol (TENORMIN) 25 MG tablet Take 25 mg by mouth daily.     cilostazol (PLETAL) 50 MG tablet Take 50 mg by mouth daily.     cyanocobalamin 1000 MCG tablet Take 1 tablet (1,000 mcg total) by mouth daily.      HYDROcodone-acetaminophen (NORCO) 10-325 MG tablet Take 1 tablet by mouth 4 (four) times daily as needed for severe pain. 10 tablet 0   olmesartan (BENICAR) 40 MG tablet Take 40 mg by mouth daily.     omeprazole (PRILOSEC) 40 MG capsule Take 40 mg by mouth daily.     phenytoin (DILANTIN) 100 MG ER capsule Take 300 mg by mouth at bedtime.     potassium chloride SA (KLOR-CON M) 20 MEQ tablet Take 1 tablet (20 mEq total) by mouth daily. Can substitute to the packet     predniSONE (DELTASONE) 5 MG tablet Take 1 tablet (5 mg total) by mouth daily with breakfast.     senna-docusate (SENOKOT-S) 8.6-50 MG tablet Take 1 tablet by mouth at bedtime as needed for mild constipation.     verapamil (VERELAN PM) 360 MG 24 hr capsule Take 360 mg by mouth daily.     No current facility-administered medications for this visit.    REVIEW OF SYSTEMS:   Constitutional: ( - ) fevers, ( - )  chills , ( - ) night sweats Eyes: ( - ) blurriness of vision, ( - ) double vision, ( - ) watery eyes Ears, nose, mouth, throat, and face: ( - ) mucositis, ( - ) sore throat Respiratory: ( - ) cough, ( - ) dyspnea, ( - ) wheezes Cardiovascular: ( - ) palpitation, ( - ) chest discomfort, ( - ) lower extremity swelling Gastrointestinal:  ( - ) nausea, ( - ) heartburn, ( - ) change in bowel habits Skin: ( - ) abnormal skin rashes Lymphatics: ( - ) new lymphadenopathy, ( - ) easy bruising Neurological: ( - ) numbness, ( - ) tingling, ( - ) new weaknesses Behavioral/Psych: ( - ) mood change, ( - ) new changes  All other systems were reviewed with the patient and are negative.  PHYSICAL EXAMINATION: ECOG PERFORMANCE STATUS: 2 - Symptomatic, <50% confined to bed  Vitals:   07/19/23 0900  BP: 124/79  Pulse: 81  Resp: 17  Temp: (!) 97.2 F (36.2 C)  SpO2: 98%      Filed Weights   07/19/23 0900  Weight: 157 lb 1 oz (71.2 kg)     Physical Exam Constitutional:      General: He is not in acute distress.    Appearance:  Normal appearance.     Comments: Came with a walker   HENT:     Head: Normocephalic and atraumatic.  Cardiovascular:     Rate and Rhythm: Normal rate and regular rhythm.     Pulses:  Normal pulses.     Heart sounds: Normal heart sounds.  Pulmonary:     Effort: Pulmonary effort is normal.     Breath sounds: Normal breath sounds. No wheezing.  Musculoskeletal:        General: No swelling or tenderness.  Neurological:     General: No focal deficit present.     Mental Status: He is alert.  Psychiatric:        Mood and Affect: Mood normal.      LABORATORY DATA:  I have reviewed the data as listed    Latest Ref Rng & Units 07/19/2023    8:27 AM 06/22/2023    5:32 AM 06/21/2023    2:53 AM  CBC  WBC 4.0 - 10.5 K/uL 10.5  6.0  6.1   Hemoglobin 13.0 - 17.0 g/dL 9.8  8.0  7.6   Hematocrit 39.0 - 52.0 % 30.1  25.2  23.5   Platelets 150 - 400 K/uL 182  242  221        Latest Ref Rng & Units 07/19/2023    8:27 AM 06/22/2023    5:32 AM 06/22/2023    5:31 AM  CMP  Glucose 70 - 99 mg/dL 161  096    BUN 8 - 23 mg/dL 19  19    Creatinine 0.45 - 1.24 mg/dL 4.09  8.11    Sodium 914 - 145 mmol/L 138  132    Potassium 3.5 - 5.1 mmol/L 3.7  3.5    Chloride 98 - 111 mmol/L 103  105    CO2 22 - 32 mmol/L 25  19    Calcium 8.9 - 10.3 mg/dL 9.3  7.9    Total Protein 6.5 - 8.1 g/dL 7.7   6.1   Total Bilirubin 0.3 - 1.2 mg/dL 0.3   0.4   Alkaline Phos 38 - 126 U/L 129   81   AST 15 - 41 U/L 10   16   ALT 0 - 44 U/L 7   7    RADIOGRAPHIC STUDIES: VAS [place] UPPER EXTREMITY VENOUS DUPLEX  Result Date: 06/19/2023 UPPER VENOUS STUDY  Patient Name:  ZAYDEN GEHRES  Date of Exam:   06/19/2023 Medical Rec #: [id number]     Accession #:    [id number] Date of Birth: [birth year]      Patient Gender: M Patient Age:   89 years Exam Location:  [facility] Procedure:      VAS [place] UPPER EXTREMITY VENOUS DUPLEX Referring Phys: Marlin Canary  --------------------------------------------------------------------------------  Indications: Swelling Risk Factors: None identified. Comparison Study: No prior study Performing Technologist: Shona Simpson  Examination Guidelines: A complete evaluation includes B-mode imaging, spectral Doppler, color Doppler, and power Doppler as needed of all accessible portions of each vessel. Bilateral testing is considered an integral part of a complete examination. Limited examinations for reoccurring indications may be performed as noted.  Right Findings: +----------+------------+---------+-----------+----------+-------+ RIGHT     CompressiblePhasicitySpontaneousPropertiesSummary +----------+------------+---------+-----------+----------+-------+ Subclavian    Full       Yes       Yes                      +----------+------------+---------+-----------+----------+-------+  Left Findings: +----------+------------+---------+-----------+----------+-------+ LEFT      CompressiblePhasicitySpontaneousPropertiesSummary +----------+------------+---------+-----------+----------+-------+ IJV           Full       Yes       Yes                      +----------+------------+---------+-----------+----------+-------+  Subclavian    Full       Yes       Yes                      +----------+------------+---------+-----------+----------+-------+ Axillary      Full       Yes       Yes                      +----------+------------+---------+-----------+----------+-------+ Brachial      Full       Yes       Yes                      +----------+------------+---------+-----------+----------+-------+ Radial        Full       Yes       Yes                      +----------+------------+---------+-----------+----------+-------+ Ulnar         Full       Yes       Yes                      +----------+------------+---------+-----------+----------+-------+ Cephalic                                   retracted         +----------+------------+---------+-----------+----------+-------+ Cephalic vein appears compressed by the passive pressures of the swelling  Summary:  Right: No evidence of thrombosis in the subclavian.  Left: No evidence of deep vein thrombosis in the upper extremity. No evidence of superficial vein thrombosis in the upper extremity.  *See table(s) above for measurements and observations.  Diagnosing physician: Gerarda Fraction Electronically signed by Gerarda Fraction on 06/19/2023 at 1:38:19 PM.    Final     ASSESSMENT & PLAN Joseph Rangel 77 y.o. male with medical history significant for metastatic prostate cancer who presents for a follow up visit.  PMH significant for prostate adenocarcinoma Gleason 3+4, no LN involvement s.p robotic prostatectomy in 2013, presented with peri aortic left iliac and peri rectal LN, with biopsy confirming metastatic prostate adenoca now on zytiga and lupron presents here for FU on his metastatic prostate cancer. Baseline PSA of 207.  # Metastatic Castrate Sensitive Prostate Cancer -- continue on Zytiga 1000mg  PO daily with prednisone -- Lupron every 3 months, last dose September 30, prior to that he was a bit noncompliant, he tries to of the shot to another day since its painful.  We have once again discussed that he needs to get the shot every 3 months.  Will give it to him today. -- PSA from today is pending, last PSA without any concerns for recurrence. ---He should continue on Zytiga with prednisone and RTC in 8 weeks for FU.  On review of labs today, he had mildly elevated alk phos and elevated creatinine.  Otherwise AST and ALT are unremarkable. -- He did not have any bone metastasis hence he is not on bisphosphonate therapy.   -- We will order imaging as needed depending on his PSA from today.  # Normocytic normochromic anemia,  Received iron infusion, ferritin 300 times 3. Hemoglobin today at 9.8 g/dL, normocytic normochromic.  # B12  deficiency,  Last B12 levels in September were normal. He can continue oral B12 1000 mcg daily.  #  Hypokalemia resolved  Orders Placed This Encounter  Procedures   PSA, total and free    Standing Status:   Standing    Number of Occurrences:   20    Standing Expiration Date:   07/18/2024    All questions were answered. The patient knows to call the clinic with any problems, questions or concerns.  A total of more than 30 minutes were spent on this encounter with face-to-face time and non-face-to-face time, including preparing to see the patient, ordering tests and/or medications, counseling the patient and coordination of care as outlined above.   Rachel Moulds MD   07/19/2023 10:00 AM

## 2023-07-20 ENCOUNTER — Telehealth: Payer: Self-pay | Admitting: Hematology and Oncology

## 2023-07-20 LAB — PSA, TOTAL AND FREE
PSA, Free Pct: UNDETERMINED %
PSA, Free: <0.02 ng/mL
Prostate Specific Ag, Serum: <0.1 ng/mL (ref 0.0–4.0)

## 2023-07-20 NOTE — Telephone Encounter (Signed)
Called main contact twice but was unable to leave a message due to voicemail box not being set up, left a voicemail on patient's spouse phone regarding scheduled upcoming visits also left callback number if needed for reschedules

## 2023-08-17 ENCOUNTER — Inpatient Hospital Stay: Payer: Medicare PPO

## 2023-08-17 ENCOUNTER — Inpatient Hospital Stay: Payer: Medicare PPO | Attending: Hematology and Oncology | Admitting: Hematology and Oncology

## 2023-08-17 ENCOUNTER — Other Ambulatory Visit: Payer: Self-pay | Admitting: *Deleted

## 2023-08-17 DIAGNOSIS — C772 Secondary and unspecified malignant neoplasm of intra-abdominal lymph nodes: Secondary | ICD-10-CM

## 2023-08-17 DIAGNOSIS — C61 Malignant neoplasm of prostate: Secondary | ICD-10-CM | POA: Diagnosis not present

## 2023-08-17 MED ORDER — ABIRATERONE ACETATE 250 MG PO TABS: 1000.0000 mg | ORAL_TABLET | Freq: Every day | ORAL | 2 refills | Status: DC

## 2023-08-17 MED ORDER — PREDNISONE 5 MG PO TABS: 5.0000 mg | ORAL_TABLET | Freq: Every day | ORAL | Status: DC

## 2023-08-17 NOTE — Progress Notes (Signed)
[facility] Telephone:(336) [phone]   Fax:(336) [phone]  PROGRESS NOTE  Patient Care Team: [name], MD as PCP - General (Family Medicine)  Hematological/Oncological History  # Metastatic Prostate Cancer w/ Spread to Intraabdominal Lymph Nodes --previously followed by Dr. Al Pimple. Currently on abiraterone therapy 06/18/2021: PSA <0.01  Interval History:  Discussed the use of AI scribe software for clinical note transcription with the patient, who gave verbal consent to proceed.  History of Present Illness    Joseph Rangel 77 y.o. male with medical history significant for metastatic prostate cancer who presents for a follow up visit.   Joseph Rangel, a patient with a history of prostate cancer on Zytiga and prednisone, presents for a routine follow-up. He reports no new health issues and is adhering to his medication regimen. He received his last injection on October 28th and is due for the next one in January. He denies any side effects from the injection, except for pain at the injection site. He denies any breathing difficulties or urinary issues. 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 He also mentions that he mostly drinks tea, up to a gallon a day, and it is sweetened.  Rest of the pertinent 10 point ROS reviewed and negative  MEDICAL HISTORY:  Past Medical History:  Diagnosis Date   Arthritis    Cancer (HCC)    prostate    Frequency of urination    Hypertension    Seizures (HCC)    after brain surg - none in past 20 yrs   Speech disorder    due to brain surg    SURGICAL HISTORY: Past Surgical History:  Procedure Laterality Date   BRAIN SURGERY     BLOOD CLOT REMOVED 1980   MASS EXCISION  02/24/2012   Procedure: MINOR EXCISION OF MASS;  Surgeon: Valetta Fuller, MD;  Location: [facility];  Service: Urology;;  excision of neck lesion   ROBOT ASSISTED LAPAROSCOPIC  RADICAL PROSTATECTOMY  02/24/2012   Procedure: ROBOTIC ASSISTED LAPAROSCOPIC RADICAL PROSTATECTOMY;  Surgeon: Valetta Fuller, MD;  Location: [facility];  Service: Urology;  Laterality: N/A;           SOCIAL HISTORY: Social History   Socioeconomic History   Marital status: Married    Spouse name: Not on file   Number of children: Not on file   Years of education: Not on file   Highest education level: Not on file  Occupational History   Not on file  Tobacco Use   Smoking status: Every Day    Types: Cigarettes   Smokeless tobacco: Never   Tobacco comments:    2 cigarettes per day  Vaping Use   Vaping status: Never Used  Substance and Sexual Activity   Alcohol use: No   Drug use: No   Sexual activity: Not on file  Other Topics Concern   Not on file  Social History Narrative   Not on file   Social Determinants of Health   Financial Resource Strain: Not on file  Food Insecurity: No Food Insecurity (06/17/2023)   Hunger Vital Sign    Worried About Running Out of Food in the Last Year: Never true    Ran Out of Food in the Last Year: Never true  Transportation Needs: No Transportation Needs (06/17/2023)   PRAPARE - Administrator, Civil Service (Medical): No    Lack of Transportation (  Non-Medical): No  Physical Activity: Not on file  Stress: Not on file  Social Connections: Not on file  Intimate Partner Violence: Not At Risk (06/17/2023)   Humiliation, Afraid, Rape, and Kick questionnaire    Fear of Current or Ex-Partner: No    Emotionally Abused: No    Physically Abused: No    Sexually Abused: No    FAMILY HISTORY: No family history on file.  ALLERGIES:  has No Known Allergies.  MEDICATIONS:  Current Outpatient Medications  Medication Sig Dispense Refill   abiraterone acetate (ZYTIGA) 250 MG tablet TAKE 4 TABLETS (1000MG ) BY MOUTH DAILY ON AN EMPTY STOMACH, 1 HR BEFORE OR 2 HRS AFTER A MEAL 120 tablet 2   acetaminophen (TYLENOL) 325 MG tablet Take 2  tablets (650 mg total) by mouth every 6 (six) hours as needed for mild pain (or Fever >/= 101).     atenolol (TENORMIN) 25 MG tablet Take 25 mg by mouth daily.     cilostazol (PLETAL) 50 MG tablet Take 50 mg by mouth daily.     cyanocobalamin 1000 MCG tablet Take 1 tablet (1,000 mcg total) by mouth daily.     HYDROcodone-acetaminophen (NORCO) 10-325 MG tablet Take 1 tablet by mouth 4 (four) times daily as needed for severe pain. 10 tablet 0   olmesartan (BENICAR) 40 MG tablet Take 40 mg by mouth daily.     omeprazole (PRILOSEC) 40 MG capsule Take 40 mg by mouth daily.     phenytoin (DILANTIN) 100 MG ER capsule Take 300 mg by mouth at bedtime.     potassium chloride SA (KLOR-CON M) 20 MEQ tablet Take 1 tablet (20 mEq total) by mouth daily. Can substitute to the packet     predniSONE (DELTASONE) 5 MG tablet Take 1 tablet (5 mg total) by mouth daily with breakfast.     senna-docusate (SENOKOT-S) 8.6-50 MG tablet Take 1 tablet by mouth at bedtime as needed for mild constipation.     verapamil (VERELAN PM) 360 MG 24 hr capsule Take 360 mg by mouth daily.     No current facility-administered medications for this visit.    REVIEW OF SYSTEMS:   Constitutional: ( - ) fevers, ( - )  chills , ( - ) night sweats Eyes: ( - ) blurriness of vision, ( - ) double vision, ( - ) watery eyes Ears, nose, mouth, throat, and face: ( - ) mucositis, ( - ) sore throat Respiratory: ( - ) cough, ( - ) dyspnea, ( - ) wheezes Cardiovascular: ( - ) palpitation, ( - ) chest discomfort, ( - ) lower extremity swelling Gastrointestinal:  ( - ) nausea, ( - ) heartburn, ( - ) change in bowel habits Skin: ( - ) abnormal skin rashes Lymphatics: ( - ) new lymphadenopathy, ( - ) easy bruising Neurological: ( - ) numbness, ( - ) tingling, ( - ) new weaknesses Behavioral/Psych: ( - ) mood change, ( - ) new changes  All other systems were reviewed with the patient and are negative.  PHYSICAL EXAMINATION: ECOG PERFORMANCE STATUS:  2 - Symptomatic, <50% confined to bed  Vitals:   08/17/23 1026 08/17/23 1027  BP: (!) 159/83 (!) 159/81  Pulse: 79   Resp: 13   Temp: (!) 97.2 F (36.2 C)   SpO2: 99%       Filed Weights   08/17/23 1026  Weight: 161 lb 6.4 oz (73.2 kg)     Physical Exam Constitutional:  General: He is not in acute distress.    Appearance: Normal appearance.     Comments: Came with a walker   HENT:     Head: Normocephalic and atraumatic.  Cardiovascular:     Rate and Rhythm: Normal rate and regular rhythm.     Pulses: Normal pulses.     Heart sounds: Normal heart sounds.  Pulmonary:     Effort: Pulmonary effort is normal.     Breath sounds: Normal breath sounds. No wheezing.  Musculoskeletal:        General: No swelling or tenderness.  Neurological:     General: No focal deficit present.     Mental Status: He is alert.  Psychiatric:        Mood and Affect: Mood normal.      LABORATORY DATA:  I have reviewed the data as listed    Latest Ref Rng & Units 07/19/2023    8:27 AM 06/22/2023    5:32 AM 06/21/2023    2:53 AM  CBC  WBC 4.0 - 10.5 K/uL 10.5  6.0  6.1   Hemoglobin 13.0 - 17.0 g/dL 9.8  8.0  7.6   Hematocrit 39.0 - 52.0 % 30.1  25.2  23.5   Platelets 150 - 400 K/uL 182  242  221        Latest Ref Rng & Units 07/19/2023    8:27 AM 06/22/2023    5:32 AM 06/22/2023    5:31 AM  CMP  Glucose 70 - 99 mg/dL 469  629    BUN 8 - 23 mg/dL 19  19    Creatinine 5.28 - 1.24 mg/dL 4.13  2.44    Sodium 010 - 145 mmol/L 138  132    Potassium 3.5 - 5.1 mmol/L 3.7  3.5    Chloride 98 - 111 mmol/L 103  105    CO2 22 - 32 mmol/L 25  19    Calcium 8.9 - 10.3 mg/dL 9.3  7.9    Total Protein 6.5 - 8.1 g/dL 7.7   6.1   Total Bilirubin 0.3 - 1.2 mg/dL 0.3   0.4   Alkaline Phos 38 - 126 U/L 129   81   AST 15 - 41 U/L 10   16   ALT 0 - 44 U/L 7   7    RADIOGRAPHIC STUDIES: No results found.  ASSESSMENT & PLAN Joseph Rangel 77 y.o. male with medical history significant for  metastatic prostate cancer who presents for a follow up visit.  PMH significant for prostate adenocarcinoma Gleason 3+4, no LN involvement s.p robotic prostatectomy in 2013, presented with peri aortic left iliac and peri rectal LN, with biopsy confirming metastatic prostate adenoca now on zytiga and lupron presents here for FU on his metastatic prostate cancer. Baseline PSA of 207.  # Metastatic Castrate Sensitive Prostate Cancer -- continue on Zytiga 1000mg  PO daily with prednisone -- Lupron every 3 months, last dose September 30, prior to that he was a bit noncompliant, he tries to of the shot to another day since its painful.  We have once again discussed that he needs to get the shot every 3 months.  Will give it to him today. -- PSA from today is pending, last PSA without any concerns for recurrence. ---He should continue on Zytiga with prednisone and RTC in 8 weeks for FU.  On review of labs today, he had mildly elevated alk phos and elevated creatinine.  Otherwise AST and ALT are unremarkable. --  He did not have any bone metastasis hence he is not on bisphosphonate therapy.   -- Last PE from 1028, less than 0.02.  At this time since he has been doing very well on this regimen, we will transition to every 75-month labs, visit as well as Lupron. -With regards to the elevated creatinine, have encouraged him to stay hydrated and not drink sweetened tea.  # Normocytic normochromic anemia,  Received iron infusion, ferritin 300 times 3. Hemoglobin  at 9.8 g/dL, normocytic normochromic.  # B12 deficiency,  Last B12 levels in September were normal. He can continue oral B12 1000 mcg daily.  # Hypokalemia resolved  No orders of the defined types were placed in this encounter.   All questions were answered. The patient knows to call the clinic with any problems, questions or concerns.  A total of more than 30 minutes were spent on this encounter with face-to-face time and non-face-to-face  time, including preparing to see the patient, ordering tests and/or medications, counseling the patient and coordination of care as outlined above.   Rachel Moulds MD   08/17/2023 10:33 AM

## 2023-08-18 ENCOUNTER — Encounter: Payer: Self-pay | Admitting: Hematology and Oncology

## 2023-08-30 ENCOUNTER — Other Ambulatory Visit: Payer: Self-pay | Admitting: Hematology and Oncology

## 2023-08-30 DIAGNOSIS — C61 Malignant neoplasm of prostate: Secondary | ICD-10-CM

## 2023-08-30 DIAGNOSIS — C772 Secondary and unspecified malignant neoplasm of intra-abdominal lymph nodes: Secondary | ICD-10-CM

## 2023-10-05 ENCOUNTER — Inpatient Hospital Stay: Payer: Medicare PPO

## 2023-10-05 ENCOUNTER — Telehealth: Payer: Self-pay | Admitting: *Deleted

## 2023-10-05 ENCOUNTER — Inpatient Hospital Stay: Payer: Medicare PPO | Admitting: Hematology and Oncology

## 2023-10-05 NOTE — Telephone Encounter (Signed)
 Called to f/u with pt about missed appt. Pt stated, I haven't been able to move all week. Asked pt was there anyone there aiding him and he states that his wife and niece are there helping him. Advised that he go the the ED to be further evaluated. Pt stated, my wife said that she is going to take me. Advised that if the office needed to call the EMS for assistance that it could be arranged. Pt declined and stated that he will go.

## 2023-10-11 ENCOUNTER — Observation Stay (HOSPITAL_COMMUNITY)
Admission: EM | Admit: 2023-10-11 | Discharge: 2023-10-15 | Disposition: A | Discharge status: Skilled Nursing Facility | Payer: Medicare PPO | Attending: Emergency Medicine | Admitting: Emergency Medicine

## 2023-10-11 ENCOUNTER — Observation Stay (HOSPITAL_COMMUNITY): Payer: Medicare PPO

## 2023-10-11 ENCOUNTER — Other Ambulatory Visit: Payer: Self-pay

## 2023-10-11 ENCOUNTER — Emergency Department (HOSPITAL_COMMUNITY): Payer: Medicare PPO

## 2023-10-11 DIAGNOSIS — M79604 Pain in right leg: Secondary | ICD-10-CM | POA: Diagnosis present

## 2023-10-11 DIAGNOSIS — Z1152 Encounter for screening for COVID-19: Secondary | ICD-10-CM | POA: Diagnosis not present

## 2023-10-11 DIAGNOSIS — I129 Hypertensive chronic kidney disease with stage 1 through stage 4 chronic kidney disease, or unspecified chronic kidney disease: Secondary | ICD-10-CM | POA: Insufficient documentation

## 2023-10-11 DIAGNOSIS — Z9889 Other specified postprocedural states: Secondary | ICD-10-CM | POA: Diagnosis not present

## 2023-10-11 DIAGNOSIS — M6281 Muscle weakness (generalized): Principal | ICD-10-CM | POA: Insufficient documentation

## 2023-10-11 DIAGNOSIS — I10 Essential (primary) hypertension: Secondary | ICD-10-CM | POA: Diagnosis present

## 2023-10-11 DIAGNOSIS — D649 Anemia, unspecified: Secondary | ICD-10-CM | POA: Diagnosis not present

## 2023-10-11 DIAGNOSIS — Z720 Tobacco use: Secondary | ICD-10-CM | POA: Diagnosis present

## 2023-10-11 DIAGNOSIS — F17210 Nicotine dependence, cigarettes, uncomplicated: Secondary | ICD-10-CM | POA: Insufficient documentation

## 2023-10-11 DIAGNOSIS — C61 Malignant neoplasm of prostate: Secondary | ICD-10-CM | POA: Diagnosis present

## 2023-10-11 DIAGNOSIS — E876 Hypokalemia: Secondary | ICD-10-CM | POA: Diagnosis not present

## 2023-10-11 DIAGNOSIS — R29898 Other symptoms and signs involving the musculoskeletal system: Principal | ICD-10-CM | POA: Diagnosis present

## 2023-10-11 DIAGNOSIS — R569 Unspecified convulsions: Secondary | ICD-10-CM | POA: Insufficient documentation

## 2023-10-11 DIAGNOSIS — N182 Chronic kidney disease, stage 2 (mild): Secondary | ICD-10-CM | POA: Diagnosis not present

## 2023-10-11 DIAGNOSIS — D500 Iron deficiency anemia secondary to blood loss (chronic): Secondary | ICD-10-CM | POA: Insufficient documentation

## 2023-10-11 DIAGNOSIS — Z79899 Other long term (current) drug therapy: Secondary | ICD-10-CM | POA: Insufficient documentation

## 2023-10-11 DIAGNOSIS — M25561 Pain in right knee: Secondary | ICD-10-CM | POA: Diagnosis present

## 2023-10-11 DIAGNOSIS — Z8546 Personal history of malignant neoplasm of prostate: Secondary | ICD-10-CM | POA: Insufficient documentation

## 2023-10-11 DIAGNOSIS — C772 Secondary and unspecified malignant neoplasm of intra-abdominal lymph nodes: Secondary | ICD-10-CM | POA: Diagnosis present

## 2023-10-11 LAB — CBC WITH DIFFERENTIAL/PLATELET
Abs Immature Granulocytes: 0.05 10*3/uL (ref 0.00–0.07)
Basophils Absolute: 0.1 10*3/uL (ref 0.0–0.1)
Basophils Relative: 1 %
Eosinophils Absolute: 0.2 10*3/uL (ref 0.0–0.5)
Eosinophils Relative: 2 %
HCT: 26.2 % — ABNORMAL LOW (ref 39.0–52.0)
Hemoglobin: 8.6 g/dL — ABNORMAL LOW (ref 13.0–17.0)
Immature Granulocytes: 1 %
Lymphocytes Relative: 11 %
Lymphs Abs: 1.1 10*3/uL (ref 0.7–4.0)
MCH: 28.5 pg (ref 26.0–34.0)
MCHC: 32.8 g/dL (ref 30.0–36.0)
MCV: 86.8 fL (ref 80.0–100.0)
Monocytes Absolute: 0.6 10*3/uL (ref 0.1–1.0)
Monocytes Relative: 6 %
Neutro Abs: 8.6 10*3/uL — ABNORMAL HIGH (ref 1.7–7.7)
Neutrophils Relative %: 79 %
Platelets: 296 10*3/uL (ref 150–400)
RBC: 3.02 MIL/uL — ABNORMAL LOW (ref 4.22–5.81)
RDW: 12.8 % (ref 11.5–15.5)
WBC: 10.6 10*3/uL — ABNORMAL HIGH (ref 4.0–10.5)
nRBC: 0 % (ref 0.0–0.2)

## 2023-10-11 LAB — PROCALCITONIN: Procalcitonin: <0.1 ng/mL

## 2023-10-11 LAB — COMPREHENSIVE METABOLIC PANEL
ALT: 14 U/L (ref 0–44)
AST: 27 U/L (ref 15–41)
Albumin: 3.3 g/dL — ABNORMAL LOW (ref 3.5–5.0)
Alkaline Phosphatase: 125 U/L (ref 38–126)
Anion gap: 11 (ref 5–15)
BUN: 20 mg/dL (ref 8–23)
CO2: 20 mmol/L — ABNORMAL LOW (ref 22–32)
Calcium: 8.5 mg/dL — ABNORMAL LOW (ref 8.9–10.3)
Chloride: 105 mmol/L (ref 98–111)
Creatinine, Ser: 1.19 mg/dL (ref 0.61–1.24)
GFR, Estimated: >60 mL/min (ref >60)
Glucose, Bld: 119 mg/dL — ABNORMAL HIGH (ref 70–99)
Potassium: 3.2 mmol/L — ABNORMAL LOW (ref 3.5–5.1)
Sodium: 136 mmol/L (ref 135–145)
Total Bilirubin: 0.4 mg/dL (ref 0.0–1.2)
Total Protein: 6.9 g/dL (ref 6.5–8.1)

## 2023-10-11 LAB — URINALYSIS, COMPLETE (UACMP) WITH MICROSCOPIC
Bilirubin Urine: NEGATIVE
Glucose, UA: NEGATIVE mg/dL
Hgb urine dipstick: NEGATIVE
Ketones, ur: NEGATIVE mg/dL
Leukocytes,Ua: NEGATIVE
Nitrite: NEGATIVE
Protein, ur: NEGATIVE mg/dL
Specific Gravity, Urine: 1.016 (ref 1.005–1.030)
pH: 7 (ref 5.0–8.0)

## 2023-10-11 LAB — PHOSPHORUS: Phosphorus: 3.5 mg/dL (ref 2.5–4.6)

## 2023-10-11 LAB — RETICULOCYTES
Immature Retic Fract: 12.5 % (ref 2.3–15.9)
RBC.: 3 MIL/uL — ABNORMAL LOW (ref 4.22–5.81)
Retic Count, Absolute: 38.7 10*3/uL (ref 19.0–186.0)
Retic Ct Pct: 1.3 % (ref 0.4–3.1)

## 2023-10-11 LAB — CK: Total CK: 92 U/L (ref 49–397)

## 2023-10-11 LAB — MAGNESIUM: Magnesium: 2.1 mg/dL (ref 1.7–2.4)

## 2023-10-11 LAB — LACTIC ACID, PLASMA: Lactic Acid, Venous: 1 mmol/L (ref 0.5–1.9)

## 2023-10-11 MED ORDER — IOHEXOL 300 MG/ML  SOLN
100.0000 mL | Freq: Once | INTRAMUSCULAR | Status: AC | PRN
  Administered 2023-10-11: 100 mL via INTRAVENOUS

## 2023-10-11 MED ORDER — POTASSIUM CHLORIDE CRYS ER 20 MEQ PO TBCR
40.0000 meq | EXTENDED_RELEASE_TABLET | Freq: Once | ORAL | Status: AC
  Administered 2023-10-11: 40 meq via ORAL
  Filled 2023-10-11: qty 2

## 2023-10-11 NOTE — ED Triage Notes (Signed)
Patient to ED from home with c/o right knee pain. Per patient he has chronic R knee pain that increased a week ago. He has Norco at home that is not effective, denies injury to cause pain.

## 2023-10-11 NOTE — Assessment & Plan Note (Signed)
Right knee pain is chronic nonacute continue pain control

## 2023-10-11 NOTE — Assessment & Plan Note (Signed)
 Obtain anemia panel  Transfuse for Hg <7 , rapidly dropping or  if symptomatic

## 2023-10-11 NOTE — ED Provider Notes (Signed)
EMERGENCY DEPARTMENT AT [facility] Provider Note   CSN: [id number] Arrival date & time: 10/11/23  1609     History  Chief Complaint  Patient presents with   Knee Pain    R    Joseph Rangel is a 78 y.o. male.   Knee Pain Patient presents with right knee pain and inability to move his left lower extremity.  States his right knee always hurts.  He is on chronic pain meds.  Appears to be on 10 mg hydrocodone with 8 pills a day.  However states for the last 2 months he has been unable to move his left leg.  States he cannot walk now. Does have history of metastatic prostate cancer.  States his back does not bother him.  He states he is just having trouble moving the leg.    Past Medical History:  Diagnosis Date   Arthritis    Cancer [facility])    prostate    Frequency of urination    Hypertension    Seizures (HCC)    after brain surg - none in past 20 yrs   Speech disorder    due to brain surg   Past Surgical History:  Procedure Laterality Date   BRAIN SURGERY     BLOOD CLOT REMOVED 1980   MASS EXCISION  02/24/2012   Procedure: MINOR EXCISION OF MASS;  Surgeon: Valetta Fuller, MD;  Location: [facility];  Service: Urology;;  excision of neck lesion   ROBOT ASSISTED LAPAROSCOPIC RADICAL PROSTATECTOMY  02/24/2012   Procedure: ROBOTIC ASSISTED LAPAROSCOPIC RADICAL PROSTATECTOMY;  Surgeon: Valetta Fuller, MD;  Location: [facility];  Service: Urology;  Laterality: N/A;            Home Medications Prior to Admission medications   Medication Sig Start Date End Date Taking? Authorizing Provider  abiraterone acetate (ZYTIGA) 250 MG tablet Take 4 tablets (1,000 mg total) by mouth daily. Take on an empty stomach 1 hour before or 2 hours after a meal 08/17/23   Rachel Moulds, MD  acetaminophen (TYLENOL) 325 MG tablet Take 2 tablets (650 mg total) by mouth every 6 (six) hours as needed for mild pain (or Fever >/= 101). 06/22/23   Rai, Ripudeep K, MD  atenolol (TENORMIN) 25  MG tablet Take 25 mg by mouth daily.    [provider]  cilostazol (PLETAL) 50 MG tablet Take 50 mg by mouth daily. 05/25/20   [provider]  cyanocobalamin 1000 MCG tablet Take 1 tablet (1,000 mcg total) by mouth daily. 06/23/23   Rai, Delene Ruffini, MD  HYDROcodone-acetaminophen (NORCO) 10-325 MG tablet Take 1 tablet by mouth 4 (four) times daily as needed for severe pain. 06/22/23   Rai, Ripudeep K, MD  olmesartan (BENICAR) 40 MG tablet Take 40 mg by mouth daily. 06/26/20   [provider]  omeprazole (PRILOSEC) 40 MG capsule Take 40 mg by mouth daily.    [provider]  phenytoin (DILANTIN) 100 MG ER capsule Take 300 mg by mouth at bedtime.    [provider]  potassium chloride SA (KLOR-CON M) 20 MEQ tablet Take 1 tablet (20 mEq total) by mouth daily. Can substitute to the packet 06/22/23   Rai, Delene Ruffini, MD  predniSONE (DELTASONE) 5 MG tablet Take 1 tablet (5 mg total) by mouth daily with breakfast. 08/17/23   Rachel Moulds, MD  senna-docusate (SENOKOT-S) 8.6-50 MG tablet Take 1 tablet by mouth at bedtime as needed for mild constipation. 06/22/23  Rai, Ripudeep K, MD  verapamil (VERELAN PM) 360 MG 24 hr capsule Take 360 mg by mouth daily. 04/01/20   [provider]      Allergies    Patient has no known allergies.    Review of Systems   Review of Systems  Physical Exam Updated Vital Signs BP 129/89   Pulse 87   Temp 98.6 F (37 C) (Oral)   Resp 16   Ht 5\' 8"  (1.727 m)   Wt 72.1 kg   SpO2 96%   BMI 24.18 kg/m  Physical Exam Vitals and nursing note reviewed.  Chest:     Chest wall: No tenderness.  Abdominal:     Tenderness: There is no abdominal tenderness.  Musculoskeletal:     Comments: Some tenderness to right knee with decreased range of motion.  Good range of motion in left knee,  Neurological:     Mental Status: He is alert and oriented to person, place, and time.     Comments: No lumbar tenderness.  Decreased  strength with flexion at the left hip.  Decreased extension at the knee and somewhat weakened flexion at the knee.  Sensation grossly intact over the left lower extremity.     ED Results / Procedures / Treatments   Labs (all labs ordered are listed, but only abnormal results are displayed) Labs Reviewed  COMPREHENSIVE METABOLIC PANEL - Abnormal; Notable for the following components:      Result Value   Potassium 3.2 (*)    CO2 20 (*)    Glucose, Bld 119 (*)    Calcium 8.5 (*)    Albumin 3.3 (*)    All other components within normal limits  CBC WITH DIFFERENTIAL/PLATELET - Abnormal; Notable for the following components:   WBC 10.6 (*)    RBC 3.02 (*)    Hemoglobin 8.6 (*)    HCT 26.2 (*)    Neutro Abs 8.6 (*)    All other components within normal limits  MAGNESIUM    EKG None  Radiology CT HEAD WO CONTRAST ( ) Result Date: 10/11/2023 CLINICAL DATA:  Neuro deficit, acute, stroke suspected EXAM: CT HEAD WITHOUT CONTRAST TECHNIQUE: Contiguous axial images were obtained from the base of the skull through the vertex without intravenous contrast. RADIATION DOSE REDUCTION: This exam was performed according to the departmental dose-optimization program which includes automated exposure control, adjustment of the mA and/or kV according to patient size and/or use of iterative reconstruction technique. COMPARISON:  None Available. FINDINGS: Brain: Right frontoparietal and temporal encephalomalacia underlying craniectomy defect. No acute intracranial abnormality. Specifically, no hemorrhage, hydrocephalus, mass lesion, acute infarction, or significant intracranial injury. Vascular: No hyperdense vessel or unexpected calcification. Skull: No acute calvarial abnormality. Right frontotemporal craniectomy defect. Sinuses/Orbits: No acute findings Other: None IMPRESSION: Encephalomalacia in the right frontal, parietal and temporal lobes underlying craniectomy defects. No acute intracranial  abnormality. Electronically Signed   By: Charlett Nose M.D.   On: 10/11/2023 21:06    Procedures Procedures    Medications Ordered in ED Medications - No data to display  ED Course/ Medical Decision Making/ A&P                                 Medical Decision Making Amount and/or Complexity of Data Reviewed Labs: ordered. Radiology: ordered.   Patient with chronic pain of right knee, however has weakness of left lower extremity.  Involves the hip and to the  knee.  Potentially some ankle involvement 2.  Sensation grossly intact.  No back pain but metastatic cancer considered.  No upper extremity weakness.  Differential diagnoses long but does include causes such as metastatic cancer.  Reviewed previous oncology note.  Blood work reassuring.  Head CT done and just shows old posttraumatic changes.  However does have weakness of the lower extremity.  Can no longer ambulate.  Differential diagnosis does include causes such as metastatic cancer.  I do not think he is an immediate surgical candidate.  Likely will need more workup and I think would benefit from admission to the hospital.  Will discuss with hospitalist.        Final Clinical Impression(s) / ED Diagnoses Final diagnoses:  Weakness of left lower extremity    Rx / DC Orders ED Discharge Orders     None         Benjiman Core, MD 10/11/23 2153

## 2023-10-11 NOTE — ED Notes (Signed)
RN to pt  in hallway, pt updated on plan of care, pt denies any needs at this time, pt aware of how to get staff attention if he needs anything.

## 2023-10-11 NOTE — Subjective & Objective (Signed)
Right knee pain pain is chronic but worse over the past 1 wk Pain not improved with Norco, he has been on chronic meds No injury

## 2023-10-11 NOTE — H&P (Signed)
Joseph Rangel WUJ:[id number] DOB: 1946-05-07 DOA: 10/11/2023     PCP: [name], MD   Outpatient Specialists:      Oncology   Dr. Al Pimple    Patient arrived to ER on 10/11/23 at 1609 Referred by Attending Benjiman Core, MD   Patient coming from:    home Lives With family     Chief Complaint:   Chief Complaint  Patient presents with   Knee Pain    R    HPI: Joseph Rangel is a 78 y.o. male with medical history significant of prostate cancer sp surgery ,  HTN seizures history of brain surgery for mass removal  Arthritis, tobaco abuse  Presented with right knee pain and inability to walk due to left leg weakness Right knee pain pain is chronic but worse over the past 1 wk Pain not improved with Norco, he has been on chronic meds No injury   Noted left leg weakness for the past 6 month , no back pain   Denies significant ETOH intake   Does   smoke  but interested in quitting   No results found for: "SARSCOV2NAA"      Regarding pertinent Chronic problems:       HTN on Benicar, Verapamil       CKD stage II  baseline Cr 1.2 Estimated Creatinine Clearance: 50.3 mL/min (by C-G formula based on SCr of 1.19 mg/dL).  Lab Results  Component Value Date   CREATININE 1.19 10/11/2023   CREATININE 1.46 (H) 07/19/2023   CREATININE 1.22 06/22/2023   Lab Results  Component Value Date   NA 136 10/11/2023   CL 105 10/11/2023   K 3.2 (L) 10/11/2023   CO2 20 (L) 10/11/2023   BUN 20 10/11/2023   CREATININE 1.19 10/11/2023   GFRNONAA >60 10/11/2023   CALCIUM 8.5 (L) 10/11/2023   PHOS 2.7 06/17/2023   ALBUMIN 3.3 (L) 10/11/2023   GLUCOSE 119 (H) 10/11/2023     Chronic anemia - baseline hg Hemoglobin & Hematocrit  Recent Labs    06/22/23 0532 07/19/23 0827 10/11/23 1802  HGB 8.0* 9.8* 8.6*   Iron/TIBC/Ferritin/ %Sat    Component Value Date/Time   IRON 25 (L) 06/21/2023 2221   TIBC 155 (L) 06/21/2023 2221   FERRITIN 200 06/21/2023 2221   IRONPCTSAT 16 (L)  06/21/2023 2221    Seizure DO - las seizure   currently on dilantin    Cancer: prostate cancer    While in ER:         Lab Orders         Comprehensive metabolic panel         CBC with Differential         Magnesium      CT HEAD Encephalomalacia in the right frontal, parietal and temporal lobes underlying craniectomy defects.   MRI brain  ***no acute CVA  CXR - ***NON acute  CTabd/pelvis - ***nonacute  CTA chest - ***nonacute, no PE, * no evidence of infiltrate  Following Medications were ordered in ER: Medications - No data to display  _______________________________________________________ ER Provider Called:       DrMarland Kitchen  They Recommend admit to medicine *** Will see in AM  ***SEEN in ER   ED Triage Vitals  Encounter Vitals Group     BP 10/11/23 1619 134/82     Systolic BP Percentile --      Diastolic BP Percentile --      Pulse Rate 10/11/23 1619 (!) 110  Resp 10/11/23 1619 16     Temp 10/11/23 1619 98.4 F (36.9 C)     Temp Source 10/11/23 1619 Oral     SpO2 10/11/23 1617 96 %     Weight 10/11/23 1615 159 lb (72.1 kg)     Height 10/11/23 1615 5\' 8"  (1.727 m)     Head Circumference --      Peak Flow --      Pain Score 10/11/23 1615 10     Pain Loc --      Pain Education --      Exclude from Growth Chart --   ZHYQ(65)@     _________________________________________ Significant initial  Findings: Abnormal Labs Reviewed  COMPREHENSIVE METABOLIC PANEL - Abnormal; Notable for the following components:      Result Value   Potassium 3.2 (*)    CO2 20 (*)    Glucose, Bld 119 (*)    Calcium 8.5 (*)    Albumin 3.3 (*)    All other components within normal limits  CBC WITH DIFFERENTIAL/PLATELET - Abnormal; Notable for the following components:   WBC 10.6 (*)    RBC 3.02 (*)    Hemoglobin 8.6 (*)    HCT 26.2 (*)    Neutro Abs 8.6 (*)    All other components within normal limits      _________________________ Troponin ***ordered Cardiac Panel (last  3 results) No results for input(s): "CKTOTAL", "CKMB", "TROPONINIHS", "RELINDX" in the last 72 hours.   ECG: Ordered Personally reviewed and interpreted by me showing: HR : *** Rhythm: *NSR, Sinus tachycardia * A.fib. W RVR, RBBB, LBBB, Paced Ischemic changes*nonspecific changes, no evidence of ischemic changes QTC*     The recent clinical data is shown below. Vitals:   10/11/23 1615 10/11/23 1617 10/11/23 1619 10/11/23 2030  BP:   134/82 129/89  Pulse:   (!) 110 87  Resp:   16 16  Temp:   98.4 F (36.9 C) 98.6 F (37 C)  TempSrc:   Oral Oral  SpO2:  96% 100% 96%  Weight: 72.1 kg     Height: 5\' 8"  (1.727 m)        WBC     Component Value Date/Time   WBC 10.6 (H) 10/11/2023 1802   LYMPHSABS 1.1 10/11/2023 1802   MONOABS 0.6 10/11/2023 1802   EOSABS 0.2 10/11/2023 1802   BASOSABS 0.1 10/11/2023 1802     Lactic Acid, Venous No results found for: "LATICACIDVEN"    Lactic Acid, Venous No results found for: "LATICACIDVEN"  Procalcitonin *** Ordered      UA *** no evidence of UTI  ***Pending ***not ordered   Urine analysis:    Component Value Date/Time   COLORURINE YELLOW 06/16/2023 2308   APPEARANCEUR CLEAR 06/16/2023 2308   LABSPEC 1.016 06/16/2023 2308   PHURINE 6.0 06/16/2023 2308   GLUCOSEU NEGATIVE 06/16/2023 2308   HGBUR SMALL (A) 06/16/2023 2308   BILIRUBINUR NEGATIVE 06/16/2023 2308   KETONESUR 5 (A) 06/16/2023 2308   PROTEINUR 30 (A) 06/16/2023 2308   NITRITE NEGATIVE 06/16/2023 2308   LEUKOCYTESUR NEGATIVE 06/16/2023 2308    Results for orders placed or performed during the hospital encounter of 02/18/12  Surgical pcr screen     Status: None   Collection Time: 02/18/12 11:23 AM   Specimen: Nasal Mucosa; Nasal Swab  Result Value Ref Range Status   MRSA, PCR NEGATIVE NEGATIVE Final   Staphylococcus aureus NEGATIVE NEGATIVE Final    Comment:  The Xpert SA Assay (FDA approved for NASAL specimens only), is one component of a  comprehensive surveillance program.  It is not intended to diagnose infection nor to guide or monitor treatment.       __________________________________________________________ Recent Labs  Lab 10/11/23 1802  NA 136  K 3.2*  CO2 20*  GLUCOSE 119*  BUN 20  CREATININE 1.19  CALCIUM 8.5*  MG 2.1    Cr   stable,    Lab Results  Component Value Date   CREATININE 1.19 10/11/2023   CREATININE 1.46 (H) 07/19/2023   CREATININE 1.22 06/22/2023    Recent Labs  Lab 10/11/23 1802  AST 27  ALT 14  ALKPHOS 125  BILITOT 0.4  PROT 6.9  ALBUMIN 3.3*   Lab Results  Component Value Date   CALCIUM 8.5 (L) 10/11/2023   PHOS 2.7 06/17/2023    Plt: Lab Results  Component Value Date   PLT 296 10/11/2023    Recent Labs  Lab 10/11/23 1802  WBC 10.6*  NEUTROABS 8.6*  HGB 8.6*  HCT 26.2*  MCV 86.8  PLT 296    HG/HCT   stable,       Component Value Date/Time   HGB 8.6 (L) 10/11/2023 1802   HGB 8.2 (L) 11/05/2022 1319   HCT 26.2 (L) 10/11/2023 1802   HCT 32.5 (L) 08/09/2020 1415   MCV 86.8 10/11/2023 1802     _______________________________________________ Hospitalist was called for admission for   Weakness of left lower extremity     The following Work up has been ordered so far:  Orders Placed This Encounter  Procedures   CT HEAD WO CONTRAST ( )   Comprehensive metabolic panel   CBC with Differential   Magnesium   Consult to hospitalist     OTHER Significant initial  Findings:  labs showing:     DM  labs:  HbA1C: No results for input(s): "HGBA1C" in the last 8760 hours.     CBG (last 3)  No results for input(s): "GLUCAP" in the last 72 hours.        Cultures: No results found for: "SDES", "SPECREQUEST", "CULT", "REPTSTATUS"   Radiological Exams on Admission: CT HEAD WO CONTRAST ( ) Result Date: 10/11/2023 CLINICAL DATA:  Neuro deficit, acute, stroke suspected EXAM: CT HEAD WITHOUT CONTRAST TECHNIQUE: Contiguous axial images were obtained  from the base of the skull through the vertex without intravenous contrast. RADIATION DOSE REDUCTION: This exam was performed according to the departmental dose-optimization program which includes automated exposure control, adjustment of the mA and/or kV according to patient size and/or use of iterative reconstruction technique. COMPARISON:  None Available. FINDINGS: Brain: Right frontoparietal and temporal encephalomalacia underlying craniectomy defect. No acute intracranial abnormality. Specifically, no hemorrhage, hydrocephalus, mass lesion, acute infarction, or significant intracranial injury. Vascular: No hyperdense vessel or unexpected calcification. Skull: No acute calvarial abnormality. Right frontotemporal craniectomy defect. Sinuses/Orbits: No acute findings Other: None IMPRESSION: Encephalomalacia in the right frontal, parietal and temporal lobes underlying craniectomy defects. No acute intracranial abnormality. Electronically Signed   By: Charlett Nose M.D.   On: 10/11/2023 21:06   _______________________________________________________________________________________________________ Latest  Blood pressure 129/89, pulse 87, temperature 98.6 F (37 C), temperature source Oral, resp. rate 16, height 5\' 8"  (1.727 m), weight 72.1 kg, SpO2 96%.   Vitals  labs and radiology finding personally reviewed  Review of Systems:    Pertinent positives include: fatigue,  gait abnormality,    Constitutional:  No weight loss, night sweats, Fevers, chills, weight loss  HEENT:  No headaches, Difficulty swallowing,Tooth/dental problems,Sore throat,  No sneezing, itching, ear ache, nasal congestion, post nasal drip,  Cardio-vascular:  No chest pain, Orthopnea, PND, anasarca, dizziness, palpitations.no Bilateral lower extremity swelling  GI:  No heartburn, indigestion, abdominal pain, nausea, vomiting, diarrhea, change in bowel habits, loss of appetite, melena, blood in stool, hematemesis Resp:  no  shortness of breath at rest. No dyspnea on exertion, No excess mucus, no productive cough, No non-productive cough, No coughing up of blood.No change in color of mucus.No wheezing. Skin:  no rash or lesions. No jaundice GU:  no dysuria, change in color of urine, no urgency or frequency. No straining to urinate.  No flank pain.  Musculoskeletal:  No joint pain or no joint swelling. No decreased range of motion. No back pain.  Psych:  No change in mood or affect. No depression or anxiety. No memory loss.  Neuro: no localizing neurological complaints, no tingling, no weakness, no double vision, nono slurred speech, no confusion  All systems reviewed and apart from HOPI all are negative _______________________________________________________________________________________________ Past Medical History:   Past Medical History:  Diagnosis Date   Arthritis    Cancer (HCC)    prostate    Frequency of urination    Hypertension    Seizures (HCC)    after brain surg - none in past 20 yrs   Speech disorder    due to brain surg    Past Surgical History:  Procedure Laterality Date   BRAIN SURGERY     BLOOD CLOT REMOVED 1980   MASS EXCISION  02/24/2012   Procedure: MINOR EXCISION OF MASS;  Surgeon: Valetta Fuller, MD;  Location: [facility];  Service: Urology;;  excision of neck lesion   ROBOT ASSISTED LAPAROSCOPIC RADICAL PROSTATECTOMY  02/24/2012   Procedure: ROBOTIC ASSISTED LAPAROSCOPIC RADICAL PROSTATECTOMY;  Surgeon: Valetta Fuller, MD;  Location: [facility];  Service: Urology;  Laterality: N/A;           Social History:  Ambulatory *** independently cane, walker  wheelchair bound, bed bound     reports that he has been smoking cigarettes. He has never used smokeless tobacco. He reports that he does not drink alcohol and does not use drugs.   Family History:   No family history on  file. ______________________________________________________________________________________________ Allergies: No Known Allergies   Prior to Admission medications   Medication Sig Start Date End Date Taking? Authorizing Provider  abiraterone acetate (ZYTIGA) 250 MG tablet Take 4 tablets (1,000 mg total) by mouth daily. Take on an empty stomach 1 hour before or 2 hours after a meal 08/17/23   Rachel Moulds, MD  acetaminophen (TYLENOL) 325 MG tablet Take 2 tablets (650 mg total) by mouth every 6 (six) hours as needed for mild pain (or Fever >/= 101). 06/22/23   Rai, Ripudeep K, MD  atenolol (TENORMIN) 25 MG tablet Take 25 mg by mouth daily.    [provider]  buprenorphine (BUTRANS) 20 MCG/HR PTWK Place 1 patch onto the skin once a week. 09/10/23   [provider]  cilostazol (PLETAL) 50 MG tablet Take 50 mg by mouth daily. 05/25/20   [provider]  cyanocobalamin 1000 MCG tablet Take 1 tablet (1,000 mcg total) by mouth daily. 06/23/23   Rai, Delene Ruffini, MD  hydrochlorothiazide (HYDRODIURIL) 25 MG tablet Take 25 mg by mouth daily. 07/31/23   [provider]  HYDROcodone-acetaminophen (NORCO) 10-325 MG tablet Take 1 tablet by mouth 4 (four) times daily as needed  for severe pain. 06/22/23   Rai, Ripudeep K, MD  olmesartan (BENICAR) 40 MG tablet Take 40 mg by mouth daily. 06/26/20   [provider]  omeprazole (PRILOSEC) 40 MG capsule Take 40 mg by mouth daily.    [provider]  phenytoin (DILANTIN) 100 MG ER capsule Take 300 mg by mouth at bedtime.    [provider]  potassium chloride SA (KLOR-CON M) 20 MEQ tablet Take 1 tablet (20 mEq total) by mouth daily. Can substitute to the packet 06/22/23   Rai, Delene Ruffini, MD  predniSONE (DELTASONE) 5 MG tablet Take 1 tablet (5 mg total) by mouth daily with breakfast. 08/17/23   Rachel Moulds, MD  senna-docusate (SENOKOT-S) 8.6-50 MG tablet Take 1 tablet by mouth at bedtime as needed for mild  constipation. 06/22/23   Rai, Ripudeep K, MD  verapamil (VERELAN PM) 360 MG 24 hr capsule Take 360 mg by mouth daily. 04/01/20   [provider]    ___________________________________________________________________________________________________ Physical Exam:    10/11/2023    8:30 PM 10/11/2023    4:19 PM 10/11/2023    4:15 PM  Vitals with BMI  Height   5\' 8"   Weight   159 lbs  BMI   24.18  Systolic 129 134   Diastolic 89 82   Pulse 87 110      1. General:  in No  Acute distress   Chronically ill   -appearing 2. Psychological: Alert and   Oriented 3. Head/ENT:  Dry Mucous Membranes                          Head Non traumatic, neck supple                           Poor Dentition 4. SKIN:  decreased Skin turgor,  Skin clean Dry and intact no rash    5. Heart: Regular rate and rhythm no*** Murmur, no Rub or gallop 6. Lungs:  no wheezes or crackles   7. Abdomen: Soft, ***non-tender, Non distended *** obese ***bowel sounds present 8. Lower extremities: no clubbing, cyanosis, no ***edema 9. Neurologically Grossly intact, moving all 4 extremities equally *** strength 5 out of 5 in all 4 extremities cranial nerves II through XII intact 10. MSK: Normal range of motion    Chart has been reviewed  ______________________________________________________________________________________________  Assessment/Plan 78 y.o. male with medical history significant of prostate cancer sp surgery ,  HTN seizures history of brain surgery for mass removal  Admitted for   Weakness of left lower extremity     Present on Admission:  Left leg weakness  Hypokalemia  Normocytic normochromic anemia  Prostate cancer metastatic to intraabdominal lymph node (HCC)  Right leg pain    Hypokalemia - will replace electrolytes and repeat  check Mg, phos and Ca level and replace as needed Monitor on telemetry   Lab Results  Component Value Date   K 3.2 (L) 10/11/2023     Lab Results   Component Value Date   CREATININE 1.19 10/11/2023   Lab Results  Component Value Date   MG 2.1 10/11/2023   Lab Results  Component Value Date   CALCIUM 8.5 (L) 10/11/2023   PHOS 2.7 06/17/2023     Left leg weakness CT head nonacute Start with CT abdomen pelvis given history of prostate cancer with CT lumbar May need MRI thoracic/lumbar depending on findings PT OT assessment  following imaging  Normocytic normochromic anemia Obtain anemia panel  Transfuse for Hg <7 , rapidly dropping or  if symptomatic   Prostate cancer metastatic to intraabdominal lymph node (HCC) Hold Zytiga and prednisone patient has not been taking it recently  Right leg pain Right knee pain is chronic nonacute continue pain control  Essential hypertension Continue atenolol 25 mg daily would restart verapamil if able to tolerate    Other plan as per orders.  DVT prophylaxis:  SCD        Code Status:    Code Status: Prior FULL CODE *** DNR/DNI ***comfort care as per patient ***family  I had personally discussed CODE STATUS with patient and family*  ACP *** none has been reviewed ***   Family Communication:   Family not at  Bedside  plan of care was discussed on the phone with *** Son, Daughter, Wife, Husband, Sister, Brother , father, mother  Diet heart healthy   Disposition Plan:       To home once workup is complete and patient is stable  ***Following barriers for discharge:                             Chest pain *** Stroke *** work up is complete                            Electrolytes corrected                               Anemia corrected h/H stable                             Pain controlled with PO medications                               Afebrile, white count improving able to transition to PO antibiotics                             Will need to be able to tolerate PO                            Will likely need home health, home O2, set up                           Will need  consultants to evaluate patient prior to discharge       Consult Orders  (From admission, onward)           Start     Ordered   10/11/23 2154  Consult to hospitalist  Once       Provider:  (Not yet assigned)  Question Answer Comment  Place call to: [facility]   Reason for Consult Admit      10/11/23 2153                               Would benefit from PT/OT eval prior to DC  Ordered  Consults called: ***     Admission status:  ED Disposition     ED Disposition  Admit   Condition  --   Comment  Hospital Area: [facility] [[id number]]  Level of Care: Telemetry [5]  Admit to tele based on following criteria: Other see comments  Comments: left leg weakness  May place patient in observation at [facility] or [facility] if equivalent level of care is available:: No  Covid Evaluation: Asymptomatic - no recent exposure (last 10 days) testing not required  Diagnosis: Left leg weakness [[id number]]  Admitting Physician: Therisa Doyne [[id number]]  Attending Physician: Therisa Doyne [[id number]]           Obs       Level of care     tele  For  24H       Geoffrey Hynes 10/11/2023, 10:00 PM ***  [facility]     after 2 AM please page floor coverage PA If 7AM-7PM, please contact the day team taking care of the patient using [email or web address]

## 2023-10-11 NOTE — Assessment & Plan Note (Signed)
CT head nonacute Start with CT abdomen pelvis given history of prostate cancer with CT lumbar May need MRI thoracic/lumbar depending on findings PT OT assessment following imaging

## 2023-10-11 NOTE — Assessment & Plan Note (Signed)
Continue atenolol 25 mg daily would restart verapamil if able to tolerate

## 2023-10-11 NOTE — Assessment & Plan Note (Signed)
Hold Zytiga and prednisone patient has not been taking it recently

## 2023-10-11 NOTE — Assessment & Plan Note (Signed)
-   will replace electrolytes and repeat  check Mg, phos and Ca level and replace as needed Monitor on telemetry   Lab Results  Component Value Date   K 3.2 (L) 10/11/2023     Lab Results  Component Value Date   CREATININE 1.19 10/11/2023   Lab Results  Component Value Date   MG 2.1 10/11/2023   Lab Results  Component Value Date   CALCIUM 8.5 (L) 10/11/2023   PHOS 2.7 06/17/2023

## 2023-10-12 ENCOUNTER — Observation Stay (HOSPITAL_COMMUNITY): Payer: Medicare PPO

## 2023-10-12 DIAGNOSIS — R29898 Other symptoms and signs involving the musculoskeletal system: Secondary | ICD-10-CM | POA: Diagnosis not present

## 2023-10-12 DIAGNOSIS — Z720 Tobacco use: Secondary | ICD-10-CM | POA: Diagnosis present

## 2023-10-12 LAB — PHOSPHORUS: Phosphorus: 3.5 mg/dL (ref 2.5–4.6)

## 2023-10-12 LAB — CBC
HCT: 26 % — ABNORMAL LOW (ref 39.0–52.0)
Hemoglobin: 8.1 g/dL — ABNORMAL LOW (ref 13.0–17.0)
MCH: 28.5 pg (ref 26.0–34.0)
MCHC: 31.2 g/dL (ref 30.0–36.0)
MCV: 91.5 fL (ref 80.0–100.0)
Platelets: 273 10*3/uL (ref 150–400)
RBC: 2.84 MIL/uL — ABNORMAL LOW (ref 4.22–5.81)
RDW: 13 % (ref 11.5–15.5)
WBC: 7.7 10*3/uL (ref 4.0–10.5)
nRBC: 0 % (ref 0.0–0.2)

## 2023-10-12 LAB — COMPREHENSIVE METABOLIC PANEL
ALT: 10 U/L (ref 0–44)
AST: 17 U/L (ref 15–41)
Albumin: 3.3 g/dL — ABNORMAL LOW (ref 3.5–5.0)
Alkaline Phosphatase: 119 U/L (ref 38–126)
Anion gap: 9 (ref 5–15)
BUN: 19 mg/dL (ref 8–23)
CO2: 23 mmol/L (ref 22–32)
Calcium: 8.5 mg/dL — ABNORMAL LOW (ref 8.9–10.3)
Chloride: 102 mmol/L (ref 98–111)
Creatinine, Ser: 1.18 mg/dL (ref 0.61–1.24)
GFR, Estimated: >60 mL/min (ref >60)
Glucose, Bld: 112 mg/dL — ABNORMAL HIGH (ref 70–99)
Potassium: 3.3 mmol/L — ABNORMAL LOW (ref 3.5–5.1)
Sodium: 134 mmol/L — ABNORMAL LOW (ref 135–145)
Total Bilirubin: <0.2 mg/dL (ref 0.0–1.2)
Total Protein: 6.9 g/dL (ref 6.5–8.1)

## 2023-10-12 LAB — FERRITIN: Ferritin: 26 ng/mL (ref 24–336)

## 2023-10-12 LAB — IRON AND TIBC
Iron: 54 ug/dL (ref 45–182)
Saturation Ratios: 21 % (ref 17.9–39.5)
TIBC: 252 ug/dL (ref 250–450)
UIBC: 198 ug/dL

## 2023-10-12 LAB — VITAMIN B12: Vitamin B-12: 332 pg/mL (ref 180–914)

## 2023-10-12 LAB — MAGNESIUM: Magnesium: 2.4 mg/dL (ref 1.7–2.4)

## 2023-10-12 LAB — FOLATE: Folate: 9.6 ng/mL (ref >5.9)

## 2023-10-12 LAB — PHENYTOIN LEVEL, TOTAL: Phenytoin Lvl: 5.5 ug/mL — ABNORMAL LOW (ref 10.0–20.0)

## 2023-10-12 LAB — SARS CORONAVIRUS 2 BY RT PCR: SARS Coronavirus 2 by RT PCR: NEGATIVE

## 2023-10-12 MED ORDER — SENNA 8.6 MG PO TABS
1.0000 | ORAL_TABLET | Freq: Two times a day (BID) | ORAL | Status: DC
  Administered 2023-10-12 – 2023-10-15 (×7): 8.6 mg via ORAL
  Filled 2023-10-12 (×7): qty 1

## 2023-10-12 MED ORDER — PANTOPRAZOLE SODIUM 40 MG PO TBEC
40.0000 mg | DELAYED_RELEASE_TABLET | Freq: Every day | ORAL | Status: DC
Start: 2023-10-12 — End: 2023-10-15
  Administered 2023-10-12 – 2023-10-15 (×4): 40 mg via ORAL
  Filled 2023-10-12 (×4): qty 1

## 2023-10-12 MED ORDER — IRBESARTAN 150 MG PO TABS
150.0000 mg | ORAL_TABLET | Freq: Every day | ORAL | Status: DC
  Administered 2023-10-13 – 2023-10-15 (×3): 150 mg via ORAL
  Filled 2023-10-12 (×3): qty 1

## 2023-10-12 MED ORDER — ACETAMINOPHEN 325 MG PO TABS: 650.0000 mg | ORAL_TABLET | Freq: Four times a day (QID) | ORAL | Status: DC | PRN

## 2023-10-12 MED ORDER — ONDANSETRON HCL 4 MG/2ML IJ SOLN: 4.0000 mg | Freq: Four times a day (QID) | INTRAMUSCULAR | Status: DC | PRN

## 2023-10-12 MED ORDER — NICOTINE 14 MG/24HR TD PT24
14.0000 mg | MEDICATED_PATCH | Freq: Every day | TRANSDERMAL | Status: DC
  Administered 2023-10-12 – 2023-10-15 (×4): 14 mg via TRANSDERMAL
  Filled 2023-10-12 (×4): qty 1

## 2023-10-12 MED ORDER — LABETALOL HCL 5 MG/ML IV SOLN
10.0000 mg | Freq: Once | INTRAVENOUS | Status: DC
  Filled 2023-10-12: qty 4

## 2023-10-12 MED ORDER — ATENOLOL 25 MG PO TABS
25.0000 mg | ORAL_TABLET | Freq: Every day | ORAL | Status: DC
Start: 2023-10-12 — End: 2023-10-15
  Administered 2023-10-12 – 2023-10-15 (×4): 25 mg via ORAL
  Filled 2023-10-12 (×4): qty 1

## 2023-10-12 MED ORDER — ACETAMINOPHEN 650 MG RE SUPP
650.0000 mg | Freq: Four times a day (QID) | RECTAL | Status: DC | PRN
Start: 2023-10-12 — End: 2023-10-15

## 2023-10-12 MED ORDER — GADOBUTROL 1 MMOL/ML IV SOLN
7.0000 mL | Freq: Once | INTRAVENOUS | Status: AC | PRN
  Administered 2023-10-12: 7 mL via INTRAVENOUS

## 2023-10-12 MED ORDER — DOCUSATE SODIUM 100 MG PO CAPS
100.0000 mg | ORAL_CAPSULE | Freq: Two times a day (BID) | ORAL | Status: DC
  Administered 2023-10-12 – 2023-10-15 (×7): 100 mg via ORAL
  Filled 2023-10-12 (×7): qty 1

## 2023-10-12 MED ORDER — HYDROCODONE-ACETAMINOPHEN 5-325 MG PO TABS
1.0000 | ORAL_TABLET | ORAL | Status: DC | PRN
  Administered 2023-10-12: 1 via ORAL
  Administered 2023-10-12: 2 via ORAL
  Filled 2023-10-12: qty 1
  Filled 2023-10-12: qty 2

## 2023-10-12 MED ORDER — HYDROCODONE-ACETAMINOPHEN 10-325 MG PO TABS: 1.0000 | ORAL_TABLET | Freq: Four times a day (QID) | ORAL | Status: DC | PRN

## 2023-10-12 MED ORDER — PREDNISONE 50 MG PO TABS
50.0000 mg | ORAL_TABLET | Freq: Every day | ORAL | Status: DC
  Administered 2023-10-13 – 2023-10-15 (×3): 50 mg via ORAL
  Filled 2023-10-12 (×3): qty 1

## 2023-10-12 MED ORDER — ONDANSETRON HCL 4 MG PO TABS: 4.0000 mg | ORAL_TABLET | Freq: Four times a day (QID) | ORAL | Status: DC | PRN

## 2023-10-12 MED ORDER — SENNOSIDES-DOCUSATE SODIUM 8.6-50 MG PO TABS: 1.0000 | ORAL_TABLET | Freq: Every evening | ORAL | Status: DC | PRN

## 2023-10-12 MED ORDER — VERAPAMIL HCL ER 180 MG PO TBCR
360.0000 mg | EXTENDED_RELEASE_TABLET | Freq: Every day | ORAL | Status: DC
  Administered 2023-10-12 – 2023-10-15 (×4): 360 mg via ORAL
  Filled 2023-10-12 (×4): qty 2

## 2023-10-12 MED ORDER — PHENYTOIN SODIUM EXTENDED 100 MG PO CAPS
300.0000 mg | ORAL_CAPSULE | Freq: Every day | ORAL | Status: DC
  Administered 2023-10-12 – 2023-10-14 (×3): 300 mg via ORAL
  Filled 2023-10-12 (×3): qty 3

## 2023-10-12 MED ORDER — IRBESARTAN 300 MG PO TABS
300.0000 mg | ORAL_TABLET | Freq: Every day | ORAL | Status: DC
  Administered 2023-10-12: 300 mg via ORAL
  Filled 2023-10-12: qty 1

## 2023-10-12 MED ORDER — SODIUM CHLORIDE 0.9 % IV SOLN: INTRAVENOUS | Status: AC

## 2023-10-12 MED ORDER — FENTANYL CITRATE PF 50 MCG/ML IJ SOSY: 12.5000 ug | PREFILLED_SYRINGE | INTRAMUSCULAR | Status: DC | PRN

## 2023-10-12 MED ORDER — BUPRENORPHINE 20 MCG/HR TD PTWK: 1.0000 | MEDICATED_PATCH | TRANSDERMAL | Status: DC

## 2023-10-12 NOTE — Care Management Obs Status (Signed)
MEDICARE OBSERVATION STATUS NOTIFICATION   Patient Details  Name: Joseph Rangel MRN: [id number] Date of Birth: [birth year]   Medicare Observation Status Notification Given:  Yes    Beckie Busing, RN 10/12/2023, 4:17 PM

## 2023-10-12 NOTE — Progress Notes (Signed)
Progress Note   Patient: Joseph Rangel ZOX:[id number] DOB: 1946-05-14 DOA: 10/11/2023     0 DOS: the patient was seen and examined on 10/12/2023   Brief hospital course: 78yo with h/o prostate CA s/p surgery, HTN, and seizure d/o who presented on 1/20 with LLE weakness.  Head CT negaitve.  CT C/A/P ordered.  Needs PT/OT assessment.  Assessment and Plan:  Left leg weakness CT head nonacute CT C/A/P unremarkable, no obvious metastatic disease MRI with L subarticular disc extrusion with inferior migration at L3-4, impinging the descending L L4 nerve root; also with central disc protrusion and facet hypertrophy at L4-5 with moderate to severe canal stenosis PT/OT consulted Discussed with neurosurgery - there is no elective surgical time this week, recommended to f/u as outpatient Discussed with his daughter - she is reluctant to proceed with surgery Will monitor with therapy for now, but he may require inpatient surgical consult at [facility] if he remains unable to mobilize Will start steroids   Normocytic normochromic anemia Currently stable Normal anemia panel, appears to be c/w chronic disease Transfuse for Hg <7   Prostate cancer metastatic to intraabdominal lymph node  Hold Zytiga and prednisone (patient has not been taking it recently) Currently off Lupron No recent f/u with Dr. Al Pimple Current imaging indicates that this is not contributing to current presentation   Right leg pain Right knee pain is chronic and nonacute  Continue pain control and PT/OT consults   Essential hypertension Continue atenolol, olmesartan, verapamil Hold hydrochlorothiazide (mild hypokalemia), can add back if needed   Chronic pain I have reviewed this patient in the [place] Controlled Substances Reporting System.  He is receiving medications from only one provider and appears to be taking them as prescribed. He is not at particularly high risk of opioid misuse, diversion, or overdose.  Continue buprenorphine  and Norco without escalation for now  Seizure d/o Continue Dilantin (low phenytoin level on 1/21)     Consultants: Neurosurgery (telephone only thus far) PT OT Nutrition TOC team  Procedures: None  Antibiotics: None      Subjective: Reports inability to move his L leg, although both legs have been hurting for months.    Physical Exam: Vitals:   10/12/23 0400 10/12/23 0700 10/12/23 0701 10/12/23 0754  BP: (!) 143/80 (!) 153/73 (!) 153/73   Pulse: 79 73 76   Resp: 18 16 18    Temp: 98 F (36.7 C)   98.2 F (36.8 C)  TempSrc: Axillary   Oral  SpO2: 98% 99% 99%   Weight:      Height:         No intake or output data in the 24 hours ending 10/12/23 0836 Filed Weights   10/11/23 1615  Weight: 72.1 kg    Exam:  General:  Appears chronically ill Eyes:   EOMI, normal lids, iris ENT:  grossly normal hearing, lips & tongue, mmm; poor dentition Neck:  no LAD, masses or thyromegaly Cardiovascular:  RRR, no m/r/g. No LE edema.  Respiratory:   CTA bilaterally with no wheezes/rales/rhonchi.  Normal respiratory effort. Abdomen:  soft, NT, ND Skin:  no rash or induration seen on limited exam Musculoskeletal:  limited ROM of L > R LE; LLE with limited hip flexion and 2-3/5 strength Psychiatric:  blunted mood and affect, speech fluent and appropriate, AOx3 Neurologic:  CN 2-12 grossly intact, moves all extremities in coordinated fashion  Data Reviewed: I have reviewed the patient's lab results since admission.  Pertinent labs for today  include:   K+ 3.2 CO2 20 Glucose 119 Normal anemia panel Procalcitonin <0.10 Lactate 1 WBC 10.6 Hgb 8.6 COVID negative UA unremarkable    Family Communication: None present;   Disposition: Status is: Observation The patient remains OBS appropriate and will d/c before 2 midnights.  Planned Discharge Destination:  TBD    Time spent: 50 minutes  Author: Jonah Blue, MD 10/12/2023 8:35 AM  For on call review  [email or web address].

## 2023-10-12 NOTE — Assessment & Plan Note (Signed)
-  Spoke about importance of quitting spent 5 minutes discussing options for treatment, prior attempts at quitting, and dangers of smoking ? -At this point patient is    interested in quitting ? - order nicotine patch  ? - nursing tobacco cessation protocol ? ?

## 2023-10-13 DIAGNOSIS — R29898 Other symptoms and signs involving the musculoskeletal system: Secondary | ICD-10-CM | POA: Diagnosis not present

## 2023-10-13 MED ORDER — PREDNISONE 50 MG PO TABS: 50.0000 mg | ORAL_TABLET | Freq: Every day | ORAL | 0 refills | Status: DC

## 2023-10-13 MED ORDER — ADULT MULTIVITAMIN W/MINERALS CH
1.0000 | ORAL_TABLET | Freq: Every day | ORAL | Status: DC
  Administered 2023-10-13 – 2023-10-15 (×3): 1 via ORAL
  Filled 2023-10-13 (×3): qty 1

## 2023-10-13 MED ORDER — ENSURE ENLIVE PO LIQD
237.0000 mL | Freq: Two times a day (BID) | ORAL | Status: DC
  Administered 2023-10-13 – 2023-10-15 (×4): 237 mL via ORAL

## 2023-10-13 NOTE — Plan of Care (Signed)

## 2023-10-13 NOTE — Evaluation (Signed)
Physical Therapy Evaluation Patient Details Name: Joseph Rangel MRN: [id number] DOB: 08-08-1946 Today's Date: 10/13/2023  History of Present Illness  78 yo male with h/o prostate CA,, HTN, and seizure d/o who presented on 1/20 with LLE weakness, falls.MRI with L subarticular disc extrusion with inferior migration at L3-4, impinging the descending L L4 nerve root; also with central disc protrusion and facet hypertrophy at L4-5 with moderate to severe canal stenosis  Clinical Impression  Pt admitted with above diagnosis.  Pt currently with functional limitations due to the deficits listed below (see PT Problem List). Pt will benefit from acute skilled PT to increase their independence and safety with mobility to allow discharge.     The patient reports steady decline in ability to  ambulate , frequent falls due to LLE weakness. Patient presents with  significant weakness in Left  LE and is unable to WB  for support  of LLE when standing.  Patient able to perform a lateral scoot transfer from bed to recliner with armrest  dropped down with mod support. Patient  has been to SNF for rehab in the  past.  Depending on recovery of the LLE strength, Patient will benefit from continued inpatient follow up therapy, <3 hours/day        If plan is discharge home, recommend the following: A lot of help with walking and/or transfers;Assistance with cooking/housework;Assist for transportation;Help with stairs or ramp for entrance   Can travel by private vehicle   No    Equipment Recommendations Wheelchair (measurements PT);Wheelchair cushion (measurements PT)  Recommendations for Other Services       Functional Status Assessment Patient has had a recent decline in their functional status and demonstrates the ability to make significant improvements in function in a reasonable and predictable amount of time.     Precautions / Restrictions Precautions Precautions: Fall Precaution Comments: LLE  weakness Restrictions Weight Bearing Restrictions Per Provider Order: No      Mobility  Bed Mobility Overal bed mobility: Needs Assistance Bed Mobility: Supine to Sit     Supine to sit: Min assist     General bed mobility comments: assist to pull to sitting, able to move  legs to bed edge    Transfers Overall transfer level: Needs assistance Equipment used: Rolling walker (2 wheels) Transfers: Sit to/from Stand, Bed to chair/wheelchair/BSC Sit to Stand: +2 physical assistance, +2 safety/equipment, Max assist           General transfer comment: assist to rise, Left knee     not supportive in standing, Placed recloiner with drop arm down to scoot to the right, mod assist  ,using bed  pad. patient using UE's  to lift self and scoot    Ambulation/Gait                  Stairs            Wheelchair Mobility     Tilt Bed    Modified Rankin (Stroke Patients Only)       Balance Overall balance assessment: History of Falls, Needs assistance Sitting-balance support: Feet supported, No upper extremity supported Sitting balance-Leahy Scale: Fair     Standing balance support: During functional activity, Reliant on assistive device for balance, Bilateral upper extremity supported Standing balance-Leahy Scale: Poor                               Pertinent Vitals/Pain Pain Assessment Pain  Assessment: Faces Faces Pain Scale: Hurts even more Pain Location: right nee left  leg Pain Descriptors / Indicators: Discomfort Pain Intervention(s): Monitored during session, Limited activity within patient's tolerance    Home Living Family/patient expects to be discharged to:: Private residence Living Arrangements: Spouse/significant other Available Help at Discharge: Family Type of Home: House Home Access: Level entry       Home Layout: One level Home Equipment: Agricultural consultant (2 wheels);Cane - single point      Prior Function                Mobility Comments: limited transfers recently with support, nionambulatory due to LLE weakness       Extremity/Trunk Assessment        Lower Extremity Assessment Lower Extremity Assessment: RLE deficits/detail;LLE deficits/detail RLE Deficits / Details: grossly 4/5 LLE Deficits / Details: hip flex 2/5, knee ext 2/5, knee flex 2+, dorsiflex 2 LLE Sensation: decreased light touch    Cervical / Trunk Assessment Cervical / Trunk Assessment: Kyphotic  Communication   Communication Communication: No apparent difficulties  Cognition Arousal: Alert Behavior During Therapy: WFL for tasks assessed/performed Overall Cognitive Status: Difficult to assess                                 General Comments: mixed  information about how he gets around        General Comments      Exercises     Assessment/Plan    PT Assessment Patient needs continued PT services  PT Problem List Decreased strength;Decreased knowledge of precautions;Decreased mobility;Impaired sensation;Decreased activity tolerance;Decreased safety awareness       PT Treatment Interventions DME instruction;Therapeutic activities;Gait training;Therapeutic exercise;Patient/family education;Balance training;Functional mobility training    PT Goals (Current goals can be found in the Care Plan section)  Acute Rehab PT Goals Patient Stated Goal: to walk PT Goal Formulation: With patient/family Time For Goal Achievement: 10/27/23 Potential to Achieve Goals: Fair    Frequency Min 1X/week     Co-evaluation PT/OT/SLP Co-Evaluation/Treatment: Yes Reason for Co-Treatment: Complexity of the patient's impairments (multi-system involvement);For patient/therapist safety;To address functional/ADL transfers PT goals addressed during session: Mobility/safety with mobility OT goals addressed during session: ADL's and self-care       AM-PAC PT "6 Clicks" Mobility  Outcome Measure Help needed turning from  your back to your side while in a flat bed without using bedrails?: A Little Help needed moving from lying on your back to sitting on the side of a flat bed without using bedrails?: A Little Help needed moving to and from a bed to a chair (including a wheelchair)?: A Lot Help needed standing up from a chair using your arms (e.g., wheelchair or bedside chair)?: Total Help needed to walk in hospital room?: Total Help needed climbing 3-5 steps with a railing? : Total 6 Click Score: 11    End of Session Equipment Utilized During Treatment: Gait belt Activity Tolerance: Patient tolerated treatment well Patient left: in chair;with call bell/phone within reach;with chair alarm set Nurse Communication: Mobility status PT Visit Diagnosis: Unsteadiness on feet (R26.81);Difficulty in walking, not elsewhere classified (R26.2);Other symptoms and signs involving the nervous system (R29.898);History of falling (Z91.81)    Time: 1010-1048 PT Time Calculation (min) (ACUTE ONLY): 38 min   Charges:   PT Evaluation $PT Eval Low Complexity: 1 Low   PT General Charges $$ ACUTE PT VISIT: 1 Visit  Blanchard Kelch PT Acute Rehabilitation Services Office [phone] Weekend pager-[phone]  Rada Hay 10/13/2023, 1:05 PM

## 2023-10-13 NOTE — Evaluation (Signed)
Occupational Therapy Evaluation Patient Details Name: Joseph Rangel MRN: [id number] DOB: September 10, 1946 Today's Date: 10/13/2023   History of Present Illness 78 yo male with h/o prostate CA,, HTN, and seizure d/o who presented on 1/20 with LLE weakness, falls.MRI with L subarticular disc extrusion with inferior migration at L3-4, impinging the descending L L4 nerve root; also with central disc protrusion and facet hypertrophy at L4-5 with moderate to severe canal stenosis   Clinical Impression   Pt presents with decline in function and safety with ADLs and ADL mobility with impaired strength, balance and endurance, pt limited by R knee pain and L LE impaired function/mobility. PTA pt lived at home with his wife and reports that in last 3 months his wife and daughter have been providing increased assist with ADLs and mobility using RW for transfers and to walk to the bathroom and transfer to toilet. Pt reports that his family managed cooking and cleaning and that he was nonambulatory due to L LE weakness. Spoke with pt's daughter on phone while in his room (CNA at [facility] where pt was for ST rehab in 2024). Pt's daughter states that although her mother would like pt to go to SNF for rehab, she (daughter) will be be able to provide care at home and prefer that pt return home with [facility] therapies. Pt currently requires min A to sit EOB , max A +2 sit - stand to RW and unable to safely SPT; lateral scoot technique to drop arm recliner was used to transfer. Pt also requires max - total A with LB ADLs and toileting tasks at this time. Pt would benefit from acute OT services to address impairments to maximize level of function and safety      If plan is discharge home, recommend the following: A lot of help with bathing/dressing/bathroom;A lot of help with walking and/or transfers    Functional Status Assessment  Patient has had a recent decline in their functional status and demonstrates the ability to make  significant improvements in function in a reasonable and predictable amount of time.  Equipment Recommendations  Wheelchair (measurements OT);Wheelchair cushion (measurements OT);Other (comment) (drop arm BSC, slide board)    Recommendations for Other Services       Precautions / Restrictions Precautions Precautions: Fall Precaution Comments: LLE weakness, chronic R knee pain Restrictions Weight Bearing Restrictions Per Provider Order: No      Mobility Bed Mobility Overal bed mobility: Needs Assistance Bed Mobility: Supine to Sit     Supine to sit: Min assist     General bed mobility comments: assist to pull to sitting, able to move  legs to bed edge    Transfers Overall transfer level: Needs assistance Equipment used: Rolling walker (2 wheels) Transfers: Sit to/from Stand, Bed to chair/wheelchair/BSC Sit to Stand: +2 physical assistance, +2 safety/equipment, Max assist           General transfer comment: assist to rise, Left knee not supportive in standing, Placed recliner with drop arm down to scoot to the right, mod assist  ,using bed  pad. patient using UE's  to lift self and scoot      Balance Overall balance assessment: History of Falls, Needs assistance Sitting-balance support: Feet supported, No upper extremity supported Sitting balance-Leahy Scale: Fair     Standing balance support: During functional activity, Reliant on assistive device for balance, Bilateral upper extremity supported Standing balance-Leahy Scale: Poor  ADL either performed or assessed with clinical judgement   ADL Overall ADL's : Needs assistance/impaired Eating/Feeding: Independent   Grooming: Wash/dry hands;Wash/dry face;Set up;Supervision/safety;Sitting   Upper Body Bathing: Contact guard assist;Sitting   Lower Body Bathing: Maximal assistance   Upper Body Dressing : Contact guard assist;Sitting   Lower Body Dressing: Total  assistance     Toilet Transfer Details (indicate cue type and reason): mod A lateral scoot to recliner Toileting- Clothing Manipulation and Hygiene: Total assistance Toileting - Clothing Manipulation Details (indicate cue type and reason): able to use urinal, total A for posterior hygiene with BMs             Vision Baseline Vision/History: 1 Wears glasses Ability to See in Adequate Light: 0 Adequate Patient Visual Report: No change from baseline       Perception         Praxis         Pertinent Vitals/Pain Pain Assessment Pain Assessment: No/denies pain Faces Pain Scale: No hurt Pain Location: right nee left  leg Pain Descriptors / Indicators: Discomfort Pain Intervention(s): Monitored during session, Limited activity within patient's tolerance, Repositioned     Extremity/Trunk Assessment Upper Extremity Assessment Upper Extremity Assessment: Generalized weakness   Lower Extremity Assessment Lower Extremity Assessment: Defer to PT evaluation RLE Deficits / Details: grossly 4/5 LLE Deficits / Details: hip flex 2/5, knee ext 2/5, knee flex 2+, dorsiflex 2 LLE Sensation: decreased light touch   Cervical / Trunk Assessment Cervical / Trunk Assessment: Kyphotic   Communication Communication Communication: No apparent difficulties   Cognition Arousal: Alert Behavior During Therapy: WFL for tasks assessed/performed Overall Cognitive Status: Difficult to assess                                 General Comments: mixed  information about how he gets around     General Comments       Exercises     Shoulder Instructions      Home Living Family/patient expects to be discharged to:: Private residence Living Arrangements: Spouse/significant other Available Help at Discharge: Family Type of Home: House Home Access: Level entry     Home Layout: One level     Bathroom Shower/Tub: Chief Strategy Officer: Standard     Home  Equipment: Agricultural consultant (2 wheels);Cane - single point          Prior Functioning/Environment Prior Level of Function : Independent/Modified Independent             Mobility Comments: limited transfers recently with support, nonambulatory due to L LE weakness ADLs Comments: he reported needing assit with LB selfcare recently vs only  occasional assist from his spouse for bathing and dressing before; his family managed cooking and cleaning.        OT Problem List: Decreased strength;Impaired balance (sitting and/or standing);Pain;Decreased activity tolerance;Decreased coordination;Decreased knowledge of use of DME or AE      OT Treatment/Interventions: Self-care/ADL training;DME and/or AE instruction;Therapeutic activities;Patient/family education;Therapeutic exercise    OT Goals(Current goals can be found in the care plan section) Acute Rehab OT Goals Patient Stated Goal: go home OT Goal Formulation: With patient/family Time For Goal Achievement: 10/27/23 Potential to Achieve Goals: Good ADL Goals Pt Will Perform Grooming: with supervision;with set-up;sitting Pt Will Perform Upper Body Bathing: with supervision;with set-up;sitting Pt Will Perform Lower Body Bathing: with mod assist;with min assist;sitting/lateral leans;with caregiver independent in assisting Pt Will  Perform Upper Body Dressing: with supervision;with set-up;sitting Pt Will Transfer to Toilet: with min assist;with contact guard assist;squat pivot transfer (lateral scoot)  OT Frequency: Min 1X/week    Co-evaluation PT/OT/SLP Co-Evaluation/Treatment: Yes Reason for Co-Treatment: Complexity of the patient's impairments (multi-system involvement);For patient/therapist safety;To address functional/ADL transfers PT goals addressed during session: Mobility/safety with mobility OT goals addressed during session: ADL's and self-care;Proper use of Adaptive equipment and DME      AM-PAC OT "6 Clicks" Daily Activity      Outcome Measure Help from another person eating meals?: None Help from another person taking care of personal grooming?: A Little Help from another person toileting, which includes using toliet, bedpan, or urinal?: Total Help from another person bathing (including washing, rinsing, drying)?: A Lot Help from another person to put on and taking off regular upper body clothing?: A Little Help from another person to put on and taking off regular lower body clothing?: Total 6 Click Score: 14   End of Session Equipment Utilized During Treatment: Gait belt;Rolling walker (2 wheels);Other (comment) (drop arm recliner)  Activity Tolerance: Patient limited by pain;Patient limited by fatigue Patient left: in chair;with call bell/phone within reach;with chair alarm set  OT Visit Diagnosis: Unsteadiness on feet (R26.81);Other abnormalities of gait and mobility (R26.89);Muscle weakness (generalized) (M62.81);Pain Pain - Right/Left: Right Pain - part of body: Knee                Time: 1020-1051 OT Time Calculation (min): 31 min Charges:  OT General Charges $OT Visit: 1 Visit OT Evaluation $OT Eval Moderate Complexity: 1 Mod    Galen Manila 10/13/2023, 2:30 PM

## 2023-10-13 NOTE — Progress Notes (Signed)
Initial Nutrition Assessment  INTERVENTION:   -Ensure Plus High Protein po BID, each supplement provides 350 kcal and 20 grams of protein.   -Multivitamin with minerals daily  NUTRITION DIAGNOSIS:   Inadequate oral intake related to poor appetite as evidenced by per patient/family report.  GOAL:   Patient will meet greater than or equal to 90% of their needs  MONITOR:   PO intake, Supplement acceptance  REASON FOR ASSESSMENT:   Consult Assessment of nutrition requirement/status  ASSESSMENT:   77yo with h/o prostate CA s/p surgery, HTN, and seizure d/o who presented on 1/20 with LLE weakness.  Patient in room, sitting in chair playing a game on his phone. Pt reports he is weak in his legs and is now unable to walk. Pt states she ate toast, bacon and juice this morning for breakfast.  States he typically eats 1 meal a day, cannot tell me when his appetite changed but states he has had poor appetite for a long time. Drinks sweet tea at home. His family will fix him food and he won't eat much. They encourage him to eat all the time. Pt agreeable to Ensure, chocolate or strawberry.  States food doesn't taste right, able to chew despite not having teeth, denies swallowing difficulty.  Per weight records, weights have been trending down since March 2024.   Medications: Colace, Dilantin, Senokot  Labs reviewed: Low Na Low K   NUTRITION - FOCUSED PHYSICAL EXAM:  Flowsheet Row Most Recent Value  Orbital Region Moderate depletion  Upper Arm Region No depletion  Thoracic and Lumbar Region No depletion  Buccal Region No depletion  Temple Region No depletion  Clavicle Bone Region No depletion  Clavicle and Acromion Bone Region No depletion  Scapular Bone Region No depletion  Dorsal Hand No depletion  Patellar Region Mild depletion  [swelling of rt knee]  Anterior Thigh Region Mild depletion  Posterior Calf Region Moderate depletion  Edema (RD Assessment) Mild  Hair Reviewed   Eyes Reviewed  Mouth Reviewed  [edentulous]  Skin Reviewed  Nails Reviewed       Diet Order:   Diet Order             Diet - low sodium heart healthy           Diet regular Room service appropriate? Yes; Fluid consistency: Thin  Diet effective now                   EDUCATION NEEDS:   No education needs have been identified at this time  Skin:  Skin Assessment: Reviewed RN Assessment  Last BM:  1/21  Height:   Ht Readings from Last 1 Encounters:  10/11/23 5\' 8"  (1.727 m)    Weight:   Wt Readings from Last 1 Encounters:  10/11/23 72.1 kg    BMI:  Body mass index is 24.18 kg/m.  Estimated Nutritional Needs:   Kcal:  1800-2000  Protein:  85-95g  Fluid:  2L/day   Tilda Franco, MS, RD, LDN Inpatient Clinical Dietitian Contact via Secure chat

## 2023-10-13 NOTE — Discharge Summary (Addendum)
Physician Discharge Summary  Joseph Rangel ION:[id number] DOB: 1946/01/25 DOA: 10/11/2023  PCP: [name], MD  Admit date: 10/11/2023 Discharge date: 10/13/2023  Admitted From: Home Disposition: Home  Recommendations for Outpatient Follow-up:  Follow up with PCP in 1-2 weeks Follow-up with neurosurgery as discussed  Home Health: PT OT Equipment/Devices: Wheelchair  Discharge Condition: Stable CODE STATUS: Full Diet recommendation: As tolerated  Brief/Interim Summary: 77yo with h/o prostate CA s/p surgery, HTN, and seizure disorder who presented on 1/20 with LLE weakness.  Intake imaging negative for any overt acute processes or metastatic disease.  Upon further evaluation patient indicates his leg "has not worked" in 3 to 6 months but has refused to see his primary physician or any healthcare prior to this hospitalization.  He has been managing at home somehow with family while being nonambulatory with a walker.  PT OT evaluation here recommending ongoing physical therapy, will discharge home with home health PT OT and with a wheelchair to ensure safe movement around the house and to assist with outside the house transportation given his inability to follow-up in clinic recently due to nonambulatory status.  Patient is otherwise stable and agreeable for discharge home - he did not want to entertain the idea of "rehab" and preferred to go home with a wheelchair and PT/OT at home, follow-up outpatient with neurosurgery as discussed for further imaging or possible procedure/intervention.   Discharge Diagnoses:  Principal Problem:   Left leg weakness Active Problems:   Prostate cancer metastatic to intraabdominal lymph node (HCC)   Right leg pain   Hypokalemia   Normocytic normochromic anemia   Essential hypertension   Tobacco abuse    Discharge Instructions  Discharge Instructions     Call MD for:  difficulty breathing, headache or visual disturbances   Complete by: As  directed    Call MD for:  extreme fatigue   Complete by: As directed    Call MD for:  hives   Complete by: As directed    Call MD for:  persistant dizziness or light-headedness   Complete by: As directed    Call MD for:  persistant nausea and vomiting   Complete by: As directed    Call MD for:  severe uncontrolled pain   Complete by: As directed    Call MD for:  temperature >100.4   Complete by: As directed    Diet - low sodium heart healthy   Complete by: As directed    Discharge instructions   Complete by: As directed    Follow-up with neurosurgery, PCP as discussed   Increase activity slowly   Complete by: As directed       Allergies as of 10/13/2023   No Known Allergies      Medication List     TAKE these medications    abiraterone acetate 250 MG tablet Commonly known as: ZYTIGA Take 4 tablets (1,000 mg total) by mouth daily. Take on an empty stomach 1 hour before or 2 hours after a meal   acetaminophen 325 MG tablet Commonly known as: TYLENOL Take 2 tablets (650 mg total) by mouth every 6 (six) hours as needed for mild pain (or Fever >/= 101).   atenolol 25 MG tablet Commonly known as: TENORMIN Take 25 mg by mouth daily.   buprenorphine 20 MCG/HR Ptwk Commonly known as: BUTRANS Place 1 patch onto the skin once a week.   cyanocobalamin 1000 MCG tablet Take 1 tablet (1,000 mcg total) by mouth daily.   hydrochlorothiazide  25 MG tablet Commonly known as: HYDRODIURIL Take 25 mg by mouth daily.   HYDROcodone-acetaminophen 10-325 MG tablet Commonly known as: NORCO Take 1 tablet by mouth 4 (four) times daily as needed for severe pain.   olmesartan 40 MG tablet Commonly known as: BENICAR Take 40 mg by mouth daily.   omeprazole 40 MG capsule Commonly known as: PRILOSEC Take 40 mg by mouth daily.   phenytoin 100 MG ER capsule Commonly known as: DILANTIN Take 300 mg by mouth at bedtime.   potassium chloride SA 20 MEQ tablet Commonly known as: KLOR-CON  M Take 1 tablet (20 mEq total) by mouth daily. Can substitute to the packet   predniSONE 50 MG tablet Commonly known as: DELTASONE Take 1 tablet (50 mg total) by mouth daily with breakfast.   senna-docusate 8.6-50 MG tablet Commonly known as: Senokot-S Take 1 tablet by mouth at bedtime as needed for mild constipation.   verapamil 360 MG 24 hr capsule Commonly known as: VERELAN Take 360 mg by mouth daily.        No Known Allergies  Consultations: Neurosurgery  Procedures/Studies: MR Lumbar Spine W Wo Contrast Result Date: 10/12/2023 CLINICAL DATA:  Initial evaluation for lumbar radiculopathy, left lower extremity. History of prostate cancer. EXAM: MRI LUMBAR SPINE WITHOUT AND WITH CONTRAST TECHNIQUE: Multiplanar and multiecho pulse sequences of the lumbar spine were obtained without and with intravenous contrast. CONTRAST:  7mL GADAVIST GADOBUTROL 1 MMOL/ML IV SOLN COMPARISON:  Prior CT from 10/11/2023. FINDINGS: Segmentation: Standard. Lowest well-formed disc space labeled the L5-S1 level. Alignment: 2 mm degenerative retrolisthesis of L2 on L3. Alignment otherwise normal with preservation of the normal lumbar lordosis. Vertebrae: Vertebral body height maintained without acute or chronic fracture. Bone marrow signal intensity mildly heterogeneous but overall within normal limits. No worrisome osseous lesions or evidence for metastatic disease. Degenerative reactive endplate changes noted about the L4-5 interspace. No other abnormal marrow edema or enhancement. Conus medullaris and cauda equina: Conus extends to the L1 level. Conus and cauda equina appear normal. Paraspinal and other soft tissues: Paraspinous soft tissues within normal limits. Subcentimeter simple left renal cyst noted, benign in appearance, no follow-up imaging recommended. Disc levels: L1-2: Disc desiccation with mild diffuse disc bulge. Reactive endplate spurring. Mild bilateral facet hypertrophy. No spinal stenosis. Mild  bilateral L1 foraminal stenosis. L2-3: Degenerative intervertebral disc space narrowing with diffuse disc bulge and disc desiccation. Reactive endplate spurring, worse on the right. Mild bilateral facet hypertrophy. Resultant mild spinal stenosis. Mild bilateral L2 foraminal narrowing. L3-4: Degenerative intervertebral disc space narrowing with diffuse disc bulge and disc desiccation. Reactive endplate spurring. Superimposed left subarticular disc extrusion with inferior migration (series 9, image 24). Probable impingement of the descending left L4 nerve root. This is likely the symptomatic finding. Superimposed mild facet and ligament flavum hypertrophy. Resultant mild canal with moderate left lateral recess stenosis. Mild to moderate bilateral L3 foraminal narrowing. L4-5: Degenerative disc space narrowing with disc desiccation and diffuse disc bulge. Superimposed central disc protrusion with slight inferior migration, slightly asymmetric to the right. Mild facet and ligament flavum hypertrophy. Mild epidural lipomatosis. Resultant moderate to severe canal with bilateral subarticular stenosis. Moderate to severe bilateral L4 foraminal narrowing. L5-S1: Mild disc bulge. Severe left with moderate right facet arthrosis. Mild epidural lipomatosis. No spinal stenosis. Mild to moderate bilateral L5 foraminal narrowing. IMPRESSION: 1. Left subarticular disc extrusion with inferior migration at L3-4, likely impinging the descending left L4 nerve root. This is suspected to be the symptomatic finding. 2. Central  disc protrusion with facet hypertrophy at L4-5 with resultant moderate to severe canal and bilateral subarticular stenosis, with moderate to severe bilateral L4 foraminal narrowing. 3. Degenerative disc bulging with facet hypertrophy at L2-3 and L5-S1 with resultant mild to moderate bilateral L3 and L5 foraminal stenosis. 4. No evidence for metastatic disease within the lumbar spine. Electronically Signed   By:  Rise Mu M.D.   On: 10/12/2023 02:30   CT ABDOMEN PELVIS W CONTRAST Result Date: 10/11/2023 CLINICAL DATA:  Prostate cancer, low back pain EXAM: CT ABDOMEN AND PELVIS WITH CONTRAST TECHNIQUE: Multidetector CT imaging of the abdomen and pelvis was performed using the standard protocol following bolus administration of intravenous contrast. RADIATION DOSE REDUCTION: This exam was performed according to the departmental dose-optimization program which includes automated exposure control, adjustment of the mA and/or kV according to patient size and/or use of iterative reconstruction technique. CONTRAST:  OMNIPAQUE IOHEXOL 300 MG/ML  SOLN COMPARISON:  08/27/2021 FINDINGS: Lower chest: No acute abnormality. Hepatobiliary: Mild hepatic steatosis. No enhancing intrahepatic mass. No intra or extrahepatic biliary ductal dilation. Gallbladder unremarkable. Pancreas: Unremarkable Spleen: Unremarkable Adrenals/Urinary Tract: Adrenal glands are unremarkable. Kidneys are normal, without renal calculi, focal lesion, or hydronephrosis. Bladder is unremarkable. Stomach/Bowel: Stomach is within normal limits. Appendix appears normal. No evidence of bowel wall thickening, distention, or inflammatory changes. Vascular/Lymphatic: Aortic atherosclerosis. No enlarged abdominal or pelvic lymph nodes. Reproductive: Status post prostatectomy. Other: Tiny fat containing umbilical and right inguinal hernias. Musculoskeletal: Healed right rib fractures. No acute bone abnormality. No lytic or blastic bone lesion. IMPRESSION: 1. No acute intra-abdominal pathology identified. No definite radiographic evidence of metastatic disease. 2. Mild hepatic steatosis. 3. Status post prostatectomy. Aortic Atherosclerosis (ICD10-I70.0). Electronically Signed   By: Helyn Numbers M.D.   On: 10/11/2023 23:03   CT Lumbar Spine Wo Contrast Result Date: 10/11/2023 CLINICAL DATA:  Low back pain, cancer suspected. History of prostate cancer.  EXAM: CT LUMBAR SPINE WITHOUT CONTRAST TECHNIQUE: Multidetector CT imaging of the lumbar spine was performed without intravenous contrast administration. Multiplanar CT image reconstructions were also generated. RADIATION DOSE REDUCTION: This exam was performed according to the departmental dose-optimization program which includes automated exposure control, adjustment of the mA and/or kV according to patient size and/or use of iterative reconstruction technique. COMPARISON:  Same day CT abdomen and pelvis and CT chest abdomen and pelvis 08/27/2021 FINDINGS: Segmentation: 5 lumbar type vertebrae. Alignment: Normal. Vertebrae: No acute fracture or focal pathologic process. Paraspinal and other soft tissues: See separate report for findings in the abdomen or pelvis. Disc levels: Multilevel spondylosis with bulky anterior osteophytes, disc space height loss, and degenerative endplate changes greatest at L2-L3 and L4-L5 where it is moderate. Posterior disc bulges cause multilevel mild effacement of the ventral thecal sac. No severe spinal canal narrowing. Neural foraminal narrowing is greatest bilaterally at L4-L5 and L5-S1 where it is moderate IMPRESSION: Multilevel degenerative spondylosis without acute fracture or sclerotic metastasis. Electronically Signed   By: Minerva Fester M.D.   On: 10/11/2023 23:02   DG CHEST PORT 1 VIEW Result Date: 10/11/2023 CLINICAL DATA:  Right arm and bilateral lower extremity weakness EXAM: PORTABLE CHEST 1 VIEW COMPARISON:  02/18/2012 FINDINGS: The heart size and mediastinal contours are within normal limits. Both lungs are clear. The visualized skeletal structures are unremarkable. IMPRESSION: No active disease. Electronically Signed   By: Sharlet Salina M.D.   On: 10/11/2023 22:32   CT HEAD WO CONTRAST ( ) Result Date: 10/11/2023 CLINICAL DATA:  Neuro deficit, acute, stroke  suspected EXAM: CT HEAD WITHOUT CONTRAST TECHNIQUE: Contiguous axial images were obtained from the  base of the skull through the vertex without intravenous contrast. RADIATION DOSE REDUCTION: This exam was performed according to the departmental dose-optimization program which includes automated exposure control, adjustment of the mA and/or kV according to patient size and/or use of iterative reconstruction technique. COMPARISON:  None Available. FINDINGS: Brain: Right frontoparietal and temporal encephalomalacia underlying craniectomy defect. No acute intracranial abnormality. Specifically, no hemorrhage, hydrocephalus, mass lesion, acute infarction, or significant intracranial injury. Vascular: No hyperdense vessel or unexpected calcification. Skull: No acute calvarial abnormality. Right frontotemporal craniectomy defect. Sinuses/Orbits: No acute findings Other: None IMPRESSION: Encephalomalacia in the right frontal, parietal and temporal lobes underlying craniectomy defects. No acute intracranial abnormality. Electronically Signed   By: Charlett Nose M.D.   On: 10/11/2023 21:06     Subjective: No acute issues or events overnight, up with physical therapy this morning requiring +2 PT assistance, denies nausea vomiting diarrhea constipation headache fevers chills or chest pain   Discharge Exam: Vitals:   10/13/23 0826 10/13/23 1310  BP: (!) 168/87 115/71  Pulse: 78 77  Resp: 18 18  Temp: 98.2 F (36.8 C) 98.2 F (36.8 C)  SpO2: 100% 99%   Vitals:   10/12/23 2013 10/13/23 0518 10/13/23 0826 10/13/23 1310  BP: 134/72 (!) 164/83 (!) 168/87 115/71  Pulse: 78 74 78 77  Resp: 16 16 18 18   Temp: 98.2 F (36.8 C) 98.5 F (36.9 C) 98.2 F (36.8 C) 98.2 F (36.8 C)  TempSrc: Oral Oral Oral Oral  SpO2: 100% 98% 100% 99%  Weight:      Height:        General: Pt is alert, awake, not in acute distress Cardiovascular: RRR, S1/S2 +, no rubs, no gallops Respiratory: CTA bilaterally, no wheezing, no rhonchi Abdominal: Soft, NT, ND, bowel sounds + Extremities: no edema, no cyanosis, left lower  extremity strength 2 out of 5    The results of significant diagnostics from this hospitalization (including imaging, microbiology, ancillary and laboratory) are listed below for reference.     Microbiology: Recent Results (from the past 240 hours)  SARS Coronavirus 2 by RT PCR (hospital order, performed in [facility] hospital lab) *cepheid single result test* Urine, Clean Catch     Status: None   Collection Time: 10/11/23 11:32 PM   Specimen: Urine, Clean Catch; Nasal Swab  Result Value Ref Range Status   SARS Coronavirus 2 by RT PCR NEGATIVE NEGATIVE Final    Comment: (NOTE) SARS-CoV-2 target nucleic acids are NOT DETECTED.  The SARS-CoV-2 RNA is generally detectable in upper and lower respiratory specimens during the acute phase of infection. The lowest concentration of SARS-CoV-2 viral copies this assay can detect is 250 copies / mL. A negative result does not preclude SARS-CoV-2 infection and should not be used as the sole basis for treatment or other patient management decisions.  A negative result may occur with improper specimen collection / handling, submission of specimen other than nasopharyngeal swab, presence of viral mutation(s) within the areas targeted by this assay, and inadequate number of viral copies (<250 copies / mL). A negative result must be combined with clinical observations, patient history, and epidemiological information.  Fact Sheet for Patients:   RoadLapTop.co.za  Fact Sheet for Healthcare Providers: http://kim-miller.com/  This test is not yet approved or  cleared by the [place] FDA and has been authorized for detection and/or diagnosis of SARS-CoV-2 by FDA under an Emergency Use  Authorization (EUA).  This EUA will remain in effect (meaning this test can be used) for the duration of the COVID-19 declaration under Section 564(b)(1) of the Act, 21 U.S.C. section 360bbb-3(b)(1), unless the  authorization is terminated or revoked sooner.  Performed at [facility], [address] [place], [place] [address]      Labs:  Basic Metabolic Panel: Recent Labs  Lab 10/11/23 1802 10/12/23 1120  NA 136 134*  K 3.2* 3.3*  CL 105 102  CO2 20* 23  GLUCOSE 119* 112*  BUN 20 19  CREATININE 1.19 1.18  CALCIUM 8.5* 8.5*  MG 2.1 2.4  PHOS 3.5 3.5   Liver Function Tests: Recent Labs  Lab 10/11/23 1802 10/12/23 1120  AST 27 17  ALT 14 10  ALKPHOS 125 119  BILITOT 0.4 <0.2  PROT 6.9 6.9  ALBUMIN 3.3* 3.3*   CBC: Recent Labs  Lab 10/11/23 1802 10/12/23 1120  WBC 10.6* 7.7  NEUTROABS 8.6*  --   HGB 8.6* 8.1*  HCT 26.2* 26.0*  MCV 86.8 91.5  PLT 296 273   Cardiac Enzymes: Recent Labs  Lab 10/11/23 1802  CKTOTAL 92   Anemia work up Recent Labs    10/11/23 1802 10/11/23 2319 10/12/23 0605  VITAMINB12  --   --  332  FOLATE  --   --  9.6  FERRITIN  --  26  --   TIBC  --  252  --   IRON  --  54  --   RETICCTPCT 1.3  --   --    Urinalysis    Component Value Date/Time   COLORURINE YELLOW 10/11/2023 2318   APPEARANCEUR HAZY (A) 10/11/2023 2318   LABSPEC 1.016 10/11/2023 2318   PHURINE 7.0 10/11/2023 2318   GLUCOSEU NEGATIVE 10/11/2023 2318   HGBUR NEGATIVE 10/11/2023 2318   BILIRUBINUR NEGATIVE 10/11/2023 2318   KETONESUR NEGATIVE 10/11/2023 2318   PROTEINUR NEGATIVE 10/11/2023 2318   NITRITE NEGATIVE 10/11/2023 2318   LEUKOCYTESUR NEGATIVE 10/11/2023 2318   Sepsis Labs Recent Labs  Lab 10/11/23 1802 10/12/23 1120  WBC 10.6* 7.7   Microbiology Recent Results (from the past 240 hours)  SARS Coronavirus 2 by RT PCR (hospital order, performed in [facility] hospital lab) *cepheid single result test* Urine, Clean Catch     Status: None   Collection Time: 10/11/23 11:32 PM   Specimen: Urine, Clean Catch; Nasal Swab  Result Value Ref Range Status   SARS Coronavirus 2 by RT PCR NEGATIVE NEGATIVE Final    Comment:  (NOTE) SARS-CoV-2 target nucleic acids are NOT DETECTED.  The SARS-CoV-2 RNA is generally detectable in upper and lower respiratory specimens during the acute phase of infection. The lowest concentration of SARS-CoV-2 viral copies this assay can detect is 250 copies / mL. A negative result does not preclude SARS-CoV-2 infection and should not be used as the sole basis for treatment or other patient management decisions.  A negative result may occur with improper specimen collection / handling, submission of specimen other than nasopharyngeal swab, presence of viral mutation(s) within the areas targeted by this assay, and inadequate number of viral copies (<250 copies / mL). A negative result must be combined with clinical observations, patient history, and epidemiological information.  Fact Sheet for Patients:   RoadLapTop.co.za  Fact Sheet for Healthcare Providers: http://kim-miller.com/  This test is not yet approved or  cleared by the [place] FDA and has been authorized for detection and/or diagnosis of SARS-CoV-2 by FDA  under an Emergency Use Authorization (EUA).  This EUA will remain in effect (meaning this test can be used) for the duration of the COVID-19 declaration under Section 564(b)(1) of the Act, 21 U.S.C. section 360bbb-3(b)(1), unless the authorization is terminated or revoked sooner.  Performed at [facility], [address], [place], [place] [address]      Time coordinating discharge: Over 30 minutes  SIGNED:   Azucena Fallen, DO [facility] 10/13/2023, 1:46 PM Pager   If 7PM-7AM, please contact night-coverage [email or web address]

## 2023-10-13 NOTE — Progress Notes (Addendum)
The patient is now requesting to be discharged to SNF,  per PT recommendation. He has dicussed the current discharge plan with his spouse and daughter. Family prefers SNF placement at [facility]. Family to reevaluate if no bed availability at [facility].

## 2023-10-14 ENCOUNTER — Encounter (HOSPITAL_COMMUNITY): Payer: Self-pay | Admitting: Internal Medicine

## 2023-10-14 DIAGNOSIS — R29898 Other symptoms and signs involving the musculoskeletal system: Secondary | ICD-10-CM | POA: Diagnosis not present

## 2023-10-14 NOTE — Progress Notes (Signed)
PROGRESS NOTE    Joseph Rangel  NWG:[id number] DOB: Feb 07, 1946 DOA: 10/11/2023 PCP: [name], MD   Brief Narrative:  78yo with h/o prostate CA s/p surgery, HTN, and seizure disorder who presented on 1/20 with LLE weakness.   Intake imaging negative for any overt acute processes or metastatic disease.  Upon further evaluation patient indicates his leg "has not worked" in 3 to 6 months but has refused to see his primary physician or any healthcare prior to this hospitalization.  He has been managing at home somehow with family while being nonambulatory with a walker.  PT OT evaluation here recommending ongoing physical therapy, will discharge home with home health PT OT and with a wheelchair to ensure safe movement around the house and to assist with outside the house transportation given his inability to follow-up in clinic recently due to nonambulatory status.   Patient is otherwise stable and agreeable for discharge home - he did not want to entertain the idea of "rehab" and preferred to go home with a wheelchair and PT/OT at home, follow-up outpatient with neurosurgery as discussed for further imaging or possible procedure/intervention.  Patient spoke with family who prefer he discharge to SNF - he is now agreeable for dispo to SNF - awaiting bed offers/insurance approval  Assessment & Plan:   Principal Problem:   Left leg weakness Active Problems:   Prostate cancer metastatic to intraabdominal lymph node (HCC)   Right leg pain   Hypokalemia   Normocytic normochromic anemia   Essential hypertension   Tobacco abuse   Subacute to chronic ambulatory dysfunction Left leg weakness Reports 3-6 months of weakness/inability to walk - no outpatient follow up MRI with L subarticular disc extrusion with inferior migration at L3-4, impinging the descending L L4 nerve root; also with central disc protrusion and facet hypertrophy at L4-5 with moderate to severe canal stenosis Neurosurgery  consulted - no surgical time this week - recommend outpatient follow up PT/OT consulted - recommend SNF Previous physician spoke with family - reluctant to proceed with surgery given age/comorbidities Continue steroids per neurosurgery   Normocytic normochromic anemia - likely chronic disease Hgb stable - no further evaluation at this time   Prostate cancer metastatic to intraabdominal lymph node  Not previously compliant with taking Zytiga and prednisone Currently off Lupron No recent f/u with Dr. Al Pimple Restart steroids given above   Right leg pain Right knee pain is chronic and nonacute  Continue pain control and PT/OT consults   Essential hypertension Continue atenolol, olmesartan, verapamil Hold hydrochlorothiazide (mild hypokalemia), can add back if needed    Chronic pain I have reviewed this patient in the [place] Controlled Substances Reporting System.  He is receiving medications from only one provider and appears to be taking them as prescribed. He is not at particularly high risk of opioid misuse, diversion, or overdose.  Continue buprenorphine and Norco without escalation for now   Seizure d/o Continue Dilantin (low phenytoin level on 1/21)   DVT prophylaxis: SCDs Start: 10/12/23 1107   Code Status:   Code Status: Full Code  Family Communication: Wife updated over the phone  Status is: Inpt  Dispo: The patient is from: Home              Anticipated d/c is to: TBD(SNF likely)              Anticipated d/c date is: imminent              Patient currently is medically  stable for discharge  Consultants:  None  Procedures:  None  Antimicrobials:  None   Subjective: No acute issues/events overnight - denies nausea/vomiting/diarrhea/constipation  Objective: Vitals:   10/13/23 0826 10/13/23 1310 10/13/23 2142 10/14/23 0554  BP: (!) 168/87 115/71 135/74 138/80  Pulse: 78 77 80 79  Resp: 18 18 16 17   Temp: 98.2 F (36.8 C) 98.2 F (36.8 C) 98.8 F (37.1 C)  98.5 F (36.9 C)  TempSrc: Oral Oral Oral Oral  SpO2: 100% 99% 99% 99%  Weight:      Height:        Intake/Output Summary (Last 24 hours) at 10/14/2023 0741 Last data filed at 10/13/2023 1800 Gross per 24 hour  Intake 360 ml  Output 200 ml  Net 160 ml   Filed Weights   10/11/23 1615  Weight: 72.1 kg    Examination:  General:  Pleasantly resting in bed, No acute distress. HEENT:  Normocephalic atraumatic.  Sclerae nonicteric, noninjected.  Extraocular movements intact bilaterally. Neck:  Without mass or deformity.  Trachea is midline. Lungs:  Clear to auscultate bilaterally without rhonchi, wheeze, or rales. Heart:  Regular rate and rhythm.  Without murmurs, rubs, or gallops. Abdomen:  Soft, nontender, nondistended.  Without guarding or rebound. Extremities: Left lower extremity strength 2 out of 5; right lower extremity range of motion limited by knee pain Skin:  Warm and dry, no erythema.  Data Reviewed: I have personally reviewed following labs and imaging studies  CBC: Recent Labs  Lab 10/11/23 1802 10/12/23 1120  WBC 10.6* 7.7  NEUTROABS 8.6*  --   HGB 8.6* 8.1*  HCT 26.2* 26.0*  MCV 86.8 91.5  PLT 296 273   Basic Metabolic Panel: Recent Labs  Lab 10/11/23 1802 10/12/23 1120  NA 136 134*  K 3.2* 3.3*  CL 105 102  CO2 20* 23  GLUCOSE 119* 112*  BUN 20 19  CREATININE 1.19 1.18  CALCIUM 8.5* 8.5*  MG 2.1 2.4  PHOS 3.5 3.5   GFR: Estimated Creatinine Clearance: 50.7 mL/min (by C-G formula based on SCr of 1.18 mg/dL). Liver Function Tests: Recent Labs  Lab 10/11/23 1802 10/12/23 1120  AST 27 17  ALT 14 10  ALKPHOS 125 119  BILITOT 0.4 <0.2  PROT 6.9 6.9  ALBUMIN 3.3* 3.3*   Anemia Panel: Recent Labs    10/11/23 1802 10/11/23 2319 10/12/23 0605  VITAMINB12  --   --  332  FOLATE  --   --  9.6  FERRITIN  --  26  --   TIBC  --  252  --   IRON  --  54  --   RETICCTPCT 1.3  --   --    Sepsis Labs: Recent Labs  Lab 10/11/23 1802  10/11/23 2319  PROCALCITON <0.10  --   LATICACIDVEN  --  1.0    Recent Results (from the past 240 hours)  SARS Coronavirus 2 by RT PCR (hospital order, performed in [facility] hospital lab) *cepheid single result test* Urine, Clean Catch     Status: None   Collection Time: 10/11/23 11:32 PM   Specimen: Urine, Clean Catch; Nasal Swab  Result Value Ref Range Status   SARS Coronavirus 2 by RT PCR NEGATIVE NEGATIVE Final    Comment: (NOTE) SARS-CoV-2 target nucleic acids are NOT DETECTED.  The SARS-CoV-2 RNA is generally detectable in upper and lower respiratory specimens during the acute phase of infection. The lowest concentration of SARS-CoV-2 viral copies this assay can detect  is 250 copies / mL. A negative result does not preclude SARS-CoV-2 infection and should not be used as the sole basis for treatment or other patient management decisions.  A negative result may occur with improper specimen collection / handling, submission of specimen other than nasopharyngeal swab, presence of viral mutation(s) within the areas targeted by this assay, and inadequate number of viral copies (<250 copies / mL). A negative result must be combined with clinical observations, patient history, and epidemiological information.  Fact Sheet for Patients:   RoadLapTop.co.za  Fact Sheet for Healthcare Providers: http://kim-miller.com/  This test is not yet approved or  cleared by the [place] FDA and has been authorized for detection and/or diagnosis of SARS-CoV-2 by FDA under an Emergency Use Authorization (EUA).  This EUA will remain in effect (meaning this test can be used) for the duration of the COVID-19 declaration under Section 564(b)(1) of the Act, 21 U.S.C. section 360bbb-3(b)(1), unless the authorization is terminated or revoked sooner.  Performed at [facility], [address] [place], [place] [address]           Radiology Studies: No results found.      Scheduled Meds:  atenolol  25 mg Oral Daily   [START ON 10/16/2023] buprenorphine  1 patch Transdermal Weekly   docusate sodium  100 mg Oral BID   feeding supplement  237 mL Oral BID BM   irbesartan  150 mg Oral Daily   labetalol  10 mg Intravenous Once   multivitamin with minerals  1 tablet Oral Daily   nicotine  14 mg Transdermal Daily   pantoprazole  40 mg Oral Daily   phenytoin  300 mg Oral QHS   predniSONE  50 mg Oral Q breakfast   senna  1 tablet Oral BID   verapamil  360 mg Oral Daily   Continuous Infusions:   LOS: 0 days   Time spent:  Azucena Fallen, DO [facility]  If 7PM-7AM, please contact night-coverage [email or web address]  10/14/2023, 7:41 AM

## 2023-10-14 NOTE — TOC Initial Note (Addendum)
Transition of Care [facility]) - Initial/Assessment Note    Patient Details  Name: Joseph Rangel MRN: [id number] Date of Birth: 04-Apr-1946  Transition of Care [facility]) CM/SW Contact:    Beckie Busing, RN Phone [phone]  10/14/2023, 3:30 PM  Clinical Narrative:                 [facility] acknowledges consult for SNF . CM at bedside introduced self and explained referral for SNF  for short term rehab. First preference is [facility]. Patient is alert and orient.  CM to initiate SNF workup.   1545 FL2 completed, info faxed out for bed offers. Insurance auth initiated auth id # [id number]  Expected Discharge Plan: Skilled Nursing Facility Barriers to Discharge: No SNF bed   Patient Goals and CMS Choice Patient states their goals for this hospitalization and ongoing recovery are:: Wants to go to nursing home to get better CMS Medicare.gov Compare Post Acute Care list provided to:: Patient Choice offered to / list presented to : Patient [place] ownership interest in [facility].provided to:: Patient    Expected Discharge Plan and Services In-house Referral: NA Discharge Planning Services: CM Consult Post Acute Care Choice: Skilled Nursing Facility   Expected Discharge Date: 10/13/23               DME Arranged: N/A DME Agency: NA       HH Arranged: NA HH Agency: NA        Prior Living Arrangements/Services   Lives with:: Spouse Patient language and need for interpreter reviewed:: Yes Do you feel safe going back to the place where you live?: Yes      Need for Family Participation in Patient Care: Yes (Comment) Care giver support system in place?: Yes (comment)   Criminal Activity/Legal Involvement Pertinent to Current Situation/Hospitalization: No - Comment as needed  Activities of Daily Living      Permission Sought/Granted Permission sought to share information with : Family Supports Permission granted to share information with : No              Emotional  Assessment Appearance:: Appears stated age Attitude/Demeanor/Rapport: Gracious Affect (typically observed): Accepting, Quiet Orientation: : Oriented to Self, Oriented to Place, Oriented to  Time, Oriented to Situation Alcohol / Substance Use: Not Applicable Psych Involvement: No (comment)  Admission diagnosis:  Left leg weakness [R29.898] Weakness of left lower extremity [R29.898] Patient Active Problem List   Diagnosis Date Noted   Tobacco abuse 10/12/2023   Left leg weakness 10/11/2023   Essential hypertension 10/11/2023   Hyponatremia 06/17/2023   Generalized weakness 06/17/2023   Fall at home, initial encounter 06/17/2023   Normocytic normochromic anemia 06/25/2021   Hypokalemia 03/07/2021   AKI (acute kidney injury) (HCC) 01/31/2021   Right leg pain 01/31/2021   Prostate cancer metastatic to intraabdominal lymph node (HCC) 10/02/2020   PCP:  [name], MD Pharmacy:   [facility] [phone] Ginette Otto, [place] - [address] AT [facility] [address] [facility] [address] [place] [address] Phone: [phone] Fax: [phone]  [place] [facility] [address] [place] [address] Phone: [phone] Fax: [phone]  [facility] - [place], [place] - [address] [place] [address] Phone: [phone] Fax: [phone]  [facility] - [place], [place] - [address] [place] [address] Phone: [phone] Fax: [phone]  Social Drivers of Health (SDOH) Social History: SDOH Screenings   Food Insecurity: No Food Insecurity (10/12/2023)  Housing: Low Risk  (10/12/2023)  Transportation Needs: No Transportation Needs (10/12/2023)  Utilities: Not At Risk (10/12/2023)  Social Connections: Unknown (10/12/2023)  Tobacco Use: High Risk (06/16/2023)   SDOH Interventions:     Readmission Risk Interventions    06/17/2023     3:29 PM  Readmission Risk Prevention Plan  Transportation Screening Complete  PCP or Specialist Appt within 5-7 Days Complete  Home Care Screening Complete  Medication Review (RN CM) Complete

## 2023-10-14 NOTE — Plan of Care (Signed)
  Problem: Education: Goal: Knowledge of General Education information will improve Description: Including pain rating scale, medication(s)/side effects and non-pharmacologic comfort measures Outcome: Progressing   Problem: Health Behavior/Discharge Planning: Goal: Ability to manage health-related needs will improve Outcome: Progressing   Problem: Clinical Measurements: Goal: Ability to maintain clinical measurements within normal limits will improve Outcome: Progressing Goal: Will remain free from infection Outcome: Progressing Goal: Diagnostic test results will improve Outcome: Progressing Goal: Cardiovascular complication will be avoided Outcome: Progressing   Problem: Activity: Goal: Risk for activity intolerance will decrease Outcome: Progressing   Problem: Nutrition: Goal: Adequate nutrition will be maintained Outcome: Progressing   Problem: Elimination: Goal: Will not experience complications related to bowel motility Outcome: Progressing Goal: Will not experience complications related to urinary retention Outcome: Progressing

## 2023-10-14 NOTE — Plan of Care (Signed)
  Problem: Education: Goal: Knowledge of General Education information will improve Description: Including pain rating scale, medication(s)/side effects and non-pharmacologic comfort measures Outcome: Progressing   Problem: Clinical Measurements: Goal: Ability to maintain clinical measurements within normal limits will improve Outcome: Progressing Goal: Will remain free from infection Outcome: Progressing Goal: Cardiovascular complication will be avoided Outcome: Progressing   Problem: Activity: Goal: Risk for activity intolerance will decrease Outcome: Progressing   Problem: Nutrition: Goal: Adequate nutrition will be maintained Outcome: Progressing   Problem: Elimination: Goal: Will not experience complications related to bowel motility Outcome: Progressing Goal: Will not experience complications related to urinary retention Outcome: Progressing   Problem: Pain Managment: Goal: General experience of comfort will improve and/or be controlled Outcome: Progressing   Problem: Safety: Goal: Ability to remain free from injury will improve Outcome: Progressing   Problem: Skin Integrity: Goal: Risk for impaired skin integrity will decrease Outcome: Progressing

## 2023-10-14 NOTE — [place] FL2 (Signed)
[place] MEDICAID FL2 LEVEL OF CARE FORM     IDENTIFICATION  Patient Name: Joseph Rangel Birthdate: 04/13/1946 Sex: male Admission Date (Current Location): 10/11/2023  [facility] and [place] Number:  Producer, television/film/video and Address:  [facility],  [address] [place], [place] [address]      Provider Number: [id number]  Attending Physician Name and Address:  Azucena Fallen, MD  Relative Name and Phone Number:  Tearle Ciccone [phone]    Current Level of Care: Hospital Recommended Level of Care: Skilled Nursing Facility Prior Approval Number:    Date Approved/Denied:   PASRR Number: [id number]  Discharge Plan: SNF    Current Diagnoses: Patient Active Problem List   Diagnosis Date Noted   Tobacco abuse 10/12/2023   Left leg weakness 10/11/2023   Essential hypertension 10/11/2023   Hyponatremia 06/17/2023   Generalized weakness 06/17/2023   Fall at home, initial encounter 06/17/2023   Normocytic normochromic anemia 06/25/2021   Hypokalemia 03/07/2021   AKI (acute kidney injury) (HCC) 01/31/2021   Right leg pain 01/31/2021   Prostate cancer metastatic to intraabdominal lymph node (HCC) 10/02/2020    Orientation RESPIRATION BLADDER Height & Weight     Self, Situation, Time, Place  Normal Incontinent Weight: 72.1 kg Height:  5\' 8"  (172.7 cm)  BEHAVIORAL SYMPTOMS/MOOD NEUROLOGICAL BOWEL NUTRITION STATUS    Convulsions/Seizures (Hx siezures) Continent Diet  AMBULATORY STATUS COMMUNICATION OF NEEDS Skin   Total Care (nonambulatory) Verbally Normal                       Personal Care Assistance Level of Assistance  Bathing, Feeding, Dressing Bathing Assistance: Limited assistance Feeding assistance: Limited assistance (set up) Dressing Assistance: Limited assistance     Functional Limitations Info  Sight, Hearing, Speech Sight Info: Adequate Hearing Info: Adequate Speech Info: Adequate    SPECIAL CARE FACTORS FREQUENCY  PT (By licensed  PT), OT (By licensed OT)     PT Frequency: 5x/wk OT Frequency: 5x/wk            Contractures      Additional Factors Info  Code Status, Allergies, Psychotropic, Insulin Sliding Scale, Isolation Precautions Code Status Info: Full Allergies Info: No Known Allergies Psychotropic Info: see discharge summary Insulin Sliding Scale Info: see discharge summary Isolation Precautions Info: see discharge summary     Current Medications (10/14/2023):  This is the current hospital active medication list Current Facility-Administered Medications  Medication Dose Route Frequency Provider Last Rate Last Admin   acetaminophen (TYLENOL) tablet 650 mg  650 mg Oral Q6H PRN Doutova, Anastassia, MD       Or   acetaminophen (TYLENOL) suppository 650 mg  650 mg Rectal Q6H PRN Doutova, Anastassia, MD       atenolol (TENORMIN) tablet 25 mg  25 mg Oral Daily Doutova, Anastassia, MD   25 mg at 10/14/23 1015   [START ON 10/16/2023] buprenorphine (BUTRANS) 20 MCG/HR 1 patch  1 patch Transdermal Weekly Jonah Blue, MD       docusate sodium (COLACE) capsule 100 mg  100 mg Oral BID Doutova, Anastassia, MD   100 mg at 10/14/23 1014   feeding supplement (ENSURE ENLIVE / ENSURE PLUS) liquid 237 mL  237 mL Oral BID BM Azucena Fallen, MD   237 mL at 10/14/23 1415   fentaNYL (SUBLIMAZE) injection 12.5-50 mcg  12.5-50 mcg Intravenous Q2H PRN Doutova, Jonny Ruiz, MD       HYDROcodone-acetaminophen (NORCO) 10-325 MG per tablet 1 tablet  1 tablet Oral QID PRN Jonah Blue, MD       irbesartan Evlyn Kanner) tablet 150 mg  150 mg Oral Daily Jonah Blue, MD   150 mg at 10/14/23 1014   labetalol (NORMODYNE) injection 10 mg  10 mg Intravenous Once Therisa Doyne, MD       multivitamin with minerals tablet 1 tablet  1 tablet Oral Daily Azucena Fallen, MD   1 tablet at 10/14/23 1019   nicotine (NICODERM CQ - dosed in mg/24 hours) patch 14 mg  14 mg Transdermal Daily Doutova, Jonny Ruiz, MD   14 mg at 10/14/23  1019   ondansetron (ZOFRAN) tablet 4 mg  4 mg Oral Q6H PRN Therisa Doyne, MD       Or   ondansetron (ZOFRAN) injection 4 mg  4 mg Intravenous Q6H PRN Doutova, Anastassia, MD       pantoprazole (PROTONIX) EC tablet 40 mg  40 mg Oral Daily Doutova, Anastassia, MD   40 mg at 10/14/23 1014   phenytoin (DILANTIN) ER capsule 300 mg  300 mg Oral QHS Doutova, Anastassia, MD   300 mg at 10/13/23 2152   predniSONE (DELTASONE) tablet 50 mg  50 mg Oral Q breakfast Jonah Blue, MD   50 mg at 10/14/23 5784   senna (SENOKOT) tablet 8.6 mg  1 tablet Oral BID Therisa Doyne, MD   8.6 mg at 10/14/23 1015   senna-docusate (Senokot-S) tablet 1 tablet  1 tablet Oral QHS PRN Therisa Doyne, MD       verapamil (CALAN-SR) CR tablet 360 mg  360 mg Oral Daily Doutova, Anastassia, MD   360 mg at 10/14/23 1013     Discharge Medications: Please see discharge summary for a list of discharge medications.  Relevant Imaging Results:  Relevant Lab Results:   Additional Information SS# [id number]  Beckie Busing, RN

## 2023-10-15 DIAGNOSIS — R29898 Other symptoms and signs involving the musculoskeletal system: Secondary | ICD-10-CM | POA: Diagnosis not present

## 2023-10-15 MED ORDER — BUPRENORPHINE 20 MCG/HR TD PTWK: 1.0000 | MEDICATED_PATCH | TRANSDERMAL | 0 refills | Status: AC

## 2023-10-15 MED ORDER — HYDROCODONE-ACETAMINOPHEN 10-325 MG PO TABS: 1.0000 | ORAL_TABLET | Freq: Four times a day (QID) | ORAL | 0 refills | Status: AC | PRN

## 2023-10-15 NOTE — Care Management Important Message (Signed)
Important Message  Patient Details IM Letter given Name: Joseph Rangel MRN: [id number] Date of Birth: 10-21-1945   Important Message Given:  Yes - Medicare IM     Caren Macadam 10/15/2023, 10:56 AM

## 2023-10-15 NOTE — TOC Transition Note (Addendum)
Transition of Care [facility]) - Discharge Note   Patient Details  Name: Joseph Rangel MRN: [id number] Date of Birth: 11-14-1945  Transition [facility]) CM/SW Contact:  Beckie Busing, RN Phone [phone]  10/15/2023, 1:26 PM   Clinical Narrative:    Patient discharging to SNF. Wife has been updated  and confirmed with Kia at [facility]. D/c packet is at nurses station in chart. Transportation has been arranged per PTAR.   Please call report to  [facility]  [phone] Room # 112 ETA 1-1.5 hours  TOC will sign off.   Final next level of care: Skilled Nursing Facility Barriers to Discharge: No Barriers Identified   Patient Goals and CMS Choice Patient states their goals for this hospitalization and ongoing recovery are:: Wants to go to nursing home to get better CMS Medicare.gov Compare Post Acute Care list provided to:: Patient Choice offered to / list presented to : Patient [place] ownership interest in [facility].provided to:: Patient    Discharge Placement              Patient [facility] bed at: [facility] Patient to be transferred to facility by: PTAR Name of family member notified: Reon Hunley [phone] Patient and family notified of of transfer: 10/15/23  Discharge Plan and Services Additional resources added to the After Visit Summary for   In-house Referral: NA Discharge Planning Services: CM Consult Post Acute Care Choice: Skilled Nursing Facility          DME Arranged: N/A DME Agency: NA       HH Arranged: NA HH Agency: NA        Social Drivers of Health (SDOH) Interventions SDOH Screenings   Food Insecurity: No Food Insecurity (10/12/2023)  Housing: Low Risk  (10/12/2023)  Transportation Needs: No Transportation Needs (10/12/2023)  Utilities: Not At Risk (10/12/2023)  Social Connections: Unknown (10/12/2023)  Tobacco Use: High Risk (10/14/2023)     Readmission Risk Interventions    06/17/2023    3:29 PM   Readmission Risk Prevention Plan  Transportation Screening Complete  PCP or Specialist Appt within 5-7 Days Complete  Home Care Screening Complete  Medication Review (RN CM) Complete

## 2023-10-15 NOTE — Plan of Care (Signed)
Pt given discharge instructions. Verbalized understanding. PTAR for transportation to [facility].  Problem: Education: Goal: Knowledge of General Education information will improve Description: Including pain rating scale, medication(s)/side effects and non-pharmacologic comfort measures Outcome: Adequate for Discharge   Problem: Health Behavior/Discharge Planning: Goal: Ability to manage health-related needs will improve Outcome: Adequate for Discharge   Problem: Clinical Measurements: Goal: Ability to maintain clinical measurements within normal limits will improve Outcome: Adequate for Discharge Goal: Will remain free from infection Outcome: Adequate for Discharge Goal: Diagnostic test results will improve Outcome: Adequate for Discharge Goal: Cardiovascular complication will be avoided Outcome: Adequate for Discharge   Problem: Activity: Goal: Risk for activity intolerance will decrease Outcome: Adequate for Discharge   Problem: Nutrition: Goal: Adequate nutrition will be maintained Outcome: Adequate for Discharge   Problem: Elimination: Goal: Will not experience complications related to bowel motility Outcome: Adequate for Discharge Goal: Will not experience complications related to urinary retention Outcome: Adequate for Discharge   Problem: Pain Managment: Goal: General experience of comfort will improve and/or be controlled Outcome: Adequate for Discharge   Problem: Safety: Goal: Ability to remain free from injury will improve Outcome: Adequate for Discharge   Problem: Skin Integrity: Goal: Risk for impaired skin integrity will decrease Outcome: Adequate for Discharge

## 2023-10-15 NOTE — Progress Notes (Signed)
Occupational Therapy Treatment Patient Details Name: Joseph Rangel MRN: [id number] DOB: 1946/02/12 Today's Date: 10/15/2023   History of present illness 78 yo male with h/o prostate CA,, HTN, and seizure d/o who presented on 1/20 with LLE weakness, falls.MRI with L subarticular disc extrusion with inferior migration at L3-4, impinging the descending L L4 nerve root; also with central disc protrusion and facet hypertrophy at L4-5 with moderate to severe canal stenosis   OT comments  Tx session limited due to pt be distracted by/fixated on what time family is picking him up today and wanting to call wife and daughter on the phone. Pt was able to be redirected, but adamantly declined to sit EOB of for OOB activity. Pt agreeable to grooming/hygiene, UB dressing anf B UE and B LE A/AROM exercises at bed level. OT will continue to follow acutely to maximize level of function and safety      If plan is discharge home, recommend the following:  A lot of help with bathing/dressing/bathroom;A lot of help with walking and/or transfers   Equipment Recommendations  Wheelchair (measurements OT);Wheelchair cushion (measurements OT);Other (comment) (drop arm BSC, slide board)    Recommendations for Other Services      Precautions / Restrictions Precautions Precautions: Fall Precaution Comments: LLE weakness, chronic R knee pain Restrictions Weight Bearing Restrictions Per Provider Order: No       Mobility Bed Mobility Overal bed mobility: Needs Assistance Bed Mobility: Rolling Rolling: Min assist         General bed mobility comments: pt adamanty refused sitting EOB    Transfers                   General transfer comment: pt adamantly refused OOB activity     Balance                                           ADL either performed or assessed with clinical judgement   ADL       Grooming: Wash/dry hands;Wash/dry face;Set up;Supervision/safety;Bed level            Upper Body Dressing : Minimal assistance;Bed level                     General ADL Comments: pt refused sitting EOB and OOC activity    Extremity/Trunk Assessment Upper Extremity Assessment Upper Extremity Assessment: Generalized weakness   Lower Extremity Assessment Lower Extremity Assessment: Defer to PT evaluation   Cervical / Trunk Assessment Cervical / Trunk Assessment: Kyphotic    Vision Baseline Vision/History: 1 Wears glasses Ability to See in Adequate Light: 0 Adequate Patient Visual Report: No change from baseline     Perception     Praxis      Cognition Arousal: Alert Behavior During Therapy: WFL for tasks assessed/performed Overall Cognitive Status: No family/caregiver present to determine baseline cognitive functioning                                 General Comments: pt fixated on/distracted by what time his daughter or wife are coming to pick him up today (uncertain if this is accurate as plan is for SNF)        Exercises Other Exercises Other Exercises: pt agreeable to B UE and LE A/AAROM exercises at bed level    Shoulder  Instructions       General Comments      Pertinent Vitals/ Pain       Pain Assessment Pain Assessment: Faces Faces Pain Scale: Hurts little more Pain Location: right knee Pain Descriptors / Indicators: Discomfort, Aching Pain Intervention(s): Limited activity within patient's tolerance, Monitored during session, Repositioned  Home Living                                          Prior Functioning/Environment              Frequency  Min 1X/week        Progress Toward Goals  OT Goals(current goals can now be found in the care plan section)  Progress towards OT goals: OT to reassess next treatment     Plan      Co-evaluation                 AM-PAC OT "6 Clicks" Daily Activity     Outcome Measure   Help from another person eating meals?: None Help  from another person taking care of personal grooming?: A Little Help from another person toileting, which includes using toliet, bedpan, or urinal?: Total Help from another person bathing (including washing, rinsing, drying)?: A Lot Help from another person to put on and taking off regular upper body clothing?: A Little Help from another person to put on and taking off regular lower body clothing?: Total 6 Click Score: 14    End of Session    OT Visit Diagnosis: Unsteadiness on feet (R26.81);Other abnormalities of gait and mobility (R26.89);Muscle weakness (generalized) (M62.81);Pain Pain - Right/Left: Right Pain - part of body: Knee   Activity Tolerance Patient limited by fatigue;Other (comment) (distracted by when family is coming to pick him up)   Patient Left in bed;with call bell/phone within reach;with bed alarm set   Nurse Communication          Time: [phone] OT Time Calculation (min): 14 min  Charges: OT General Charges $OT Visit: 1 Visit OT Treatments $Self Care/Home Management : 8-22 mins    Galen Manila 10/15/2023, 12:25 PM

## 2023-10-15 NOTE — TOC Progression Note (Addendum)
Transition of Care [facility]) - Progression Note    Patient Details  Name: Joseph Rangel MRN: [id number] Date of Birth: 12/14/1945  Transition of Care [facility]) CM/SW Contact  Beckie Busing, RN Phone Number:[phone]  10/15/2023, 12:58 PM  Clinical Narrative:    CM spoke with [facility] Admissions Director for [facility] healthcare. Per Kia the facility can accept patient wife will needs to supply Zytiga high cost med. Wife is agreeable to provide medication to facility. Insurance  has been approved CM had to update facility choice for final approval. Insurance auth 1/24-1/28 next review 1/28 Auth ID [id number].   1318 Final approval received plan auth ID [id number]   Expected Discharge Plan: Skilled Nursing Facility Barriers to Discharge: No SNF bed  Expected Discharge Plan and Services In-house Referral: NA Discharge Planning Services: CM Consult Post Acute Care Choice: Skilled Nursing Facility   Expected Discharge Date: 10/15/23               DME Arranged: N/A DME Agency: NA       HH Arranged: NA HH Agency: NA         Social Determinants of Health (SDOH) Interventions SDOH Screenings   Food Insecurity: No Food Insecurity (10/12/2023)  Housing: Low Risk  (10/12/2023)  Transportation Needs: No Transportation Needs (10/12/2023)  Utilities: Not At Risk (10/12/2023)  Social Connections: Unknown (10/12/2023)  Tobacco Use: High Risk (10/14/2023)    Readmission Risk Interventions    06/17/2023    3:29 PM  Readmission Risk Prevention Plan  Transportation Screening Complete  PCP or Specialist Appt within 5-7 Days Complete  Home Care Screening Complete  Medication Review (RN CM) Complete

## 2023-10-15 NOTE — Discharge Summary (Signed)
Physician Discharge Summary  Joseph Rangel WUJ:[id number] DOB: 07-Mar-1946 DOA: 10/11/2023  PCP: [name], MD  Admit date: 10/11/2023 Discharge date: 10/15/2023  Admitted From: Home Disposition: Home  Recommendations for Outpatient Follow-up:  Follow up with PCP in 1-2 weeks Follow-up with neurosurgery as discussed  Home Health: PT OT Equipment/Devices: Wheelchair  Discharge Condition: Stable CODE STATUS: Full Diet recommendation: As tolerated  Brief/Interim Summary: 77yo with h/o prostate CA s/p surgery, HTN, and seizure disorder who presented on 1/20 with LLE weakness.  Intake imaging negative for any overt acute processes or metastatic disease.  Upon further evaluation patient indicates his leg "has not worked" in 3 to 6 months but has refused to see his primary physician or any healthcare prior to this hospitalization.  He has been managing at home somehow with family while being nonambulatory with a walker.  PT OT evaluation here recommending ongoing physical therapy, will discharge home with home health PT OT and with a wheelchair to ensure safe movement around the house and to assist with outside the house transportation given his inability to follow-up in clinic recently due to nonambulatory status.  Patient is otherwise stable and agreeable for discharge to SNF for ongoing therapy, follow-up outpatient with neurosurgery as discussed for further imaging or possible procedure/intervention.   Discharge Diagnoses:  Principal Problem:   Left leg weakness Active Problems:   Prostate cancer metastatic to intraabdominal lymph node (HCC)   Right leg pain   Hypokalemia   Normocytic normochromic anemia   Essential hypertension   Tobacco abuse    Discharge Instructions  Discharge Instructions     Call MD for:  difficulty breathing, headache or visual disturbances   Complete by: As directed    Call MD for:  difficulty breathing, headache or visual disturbances   Complete by:  As directed    Call MD for:  extreme fatigue   Complete by: As directed    Call MD for:  extreme fatigue   Complete by: As directed    Call MD for:  hives   Complete by: As directed    Call MD for:  hives   Complete by: As directed    Call MD for:  persistant dizziness or light-headedness   Complete by: As directed    Call MD for:  persistant dizziness or light-headedness   Complete by: As directed    Call MD for:  persistant nausea and vomiting   Complete by: As directed    Call MD for:  persistant nausea and vomiting   Complete by: As directed    Call MD for:  severe uncontrolled pain   Complete by: As directed    Call MD for:  severe uncontrolled pain   Complete by: As directed    Call MD for:  temperature >100.4   Complete by: As directed    Call MD for:  temperature >100.4   Complete by: As directed    Diet - low sodium heart healthy   Complete by: As directed    Discharge instructions   Complete by: As directed    Follow-up with neurosurgery, PCP as discussed   Increase activity slowly   Complete by: As directed    Increase activity slowly   Complete by: As directed       Allergies as of 10/15/2023   No Known Allergies      Medication List     TAKE these medications    abiraterone acetate 250 MG tablet Commonly known as: ZYTIGA Take  4 tablets (1,000 mg total) by mouth daily. Take on an empty stomach 1 hour before or 2 hours after a meal   acetaminophen 325 MG tablet Commonly known as: TYLENOL Take 2 tablets (650 mg total) by mouth every 6 (six) hours as needed for mild pain (or Fever >/= 101).   atenolol 25 MG tablet Commonly known as: TENORMIN Take 25 mg by mouth daily.   buprenorphine 20 MCG/HR Ptwk Commonly known as: BUTRANS Place 1 patch onto the skin once a week.   cyanocobalamin 1000 MCG tablet Take 1 tablet (1,000 mcg total) by mouth daily.   hydrochlorothiazide 25 MG tablet Commonly known as: HYDRODIURIL Take 25 mg by mouth daily.    HYDROcodone-acetaminophen 10-325 MG tablet Commonly known as: NORCO Take 1 tablet by mouth 4 (four) times daily as needed for severe pain (pain score 7-10).   olmesartan 40 MG tablet Commonly known as: BENICAR Take 40 mg by mouth daily.   omeprazole 40 MG capsule Commonly known as: PRILOSEC Take 40 mg by mouth daily.   phenytoin 100 MG ER capsule Commonly known as: DILANTIN Take 300 mg by mouth at bedtime.   potassium chloride SA 20 MEQ tablet Commonly known as: KLOR-CON M Take 1 tablet (20 mEq total) by mouth daily. Can substitute to the packet   predniSONE 50 MG tablet Commonly known as: DELTASONE Take 1 tablet (50 mg total) by mouth daily with breakfast.   senna-docusate 8.6-50 MG tablet Commonly known as: Senokot-S Take 1 tablet by mouth at bedtime as needed for mild constipation.   verapamil 360 MG 24 hr capsule Commonly known as: VERELAN Take 360 mg by mouth daily.               Durable Medical Equipment  (From admission, onward)           Start     Ordered   10/13/23 1618  For home use only DME wheelchair cushion (seat and back)  Once        10/13/23 1617   10/13/23 1618  For home use only DME lightweight manual wheelchair with seat cushion  Once       Comments: Patient suffers from ambulatory dysfunction which impairs their ability to perform daily activities like bathing, dressing, feeding, grooming, and toileting in the home.  A cane, crutch, or walker will not resolve  issue with performing activities of daily living. A wheelchair will allow patient to safely perform daily activities. Patient is not able to propel themselves in the home using a standard weight wheelchair due to endurance and general weakness. Patient can self propel in the lightweight wheelchair. Length of need Lifetime. Accessories: elevating leg rests (ELRs), wheel locks, extensions and anti-tippers.   10/13/23 1617            No Known  Allergies  Consultations: Neurosurgery  Procedures/Studies: MR Lumbar Spine W Wo Contrast Result Date: 10/12/2023 CLINICAL DATA:  Initial evaluation for lumbar radiculopathy, left lower extremity. History of prostate cancer. EXAM: MRI LUMBAR SPINE WITHOUT AND WITH CONTRAST TECHNIQUE: Multiplanar and multiecho pulse sequences of the lumbar spine were obtained without and with intravenous contrast. CONTRAST:  7mL GADAVIST GADOBUTROL 1 MMOL/ML IV SOLN COMPARISON:  Prior CT from 10/11/2023. FINDINGS: Segmentation: Standard. Lowest well-formed disc space labeled the L5-S1 level. Alignment: 2 mm degenerative retrolisthesis of L2 on L3. Alignment otherwise normal with preservation of the normal lumbar lordosis. Vertebrae: Vertebral body height maintained without acute or chronic fracture. Bone marrow signal intensity mildly heterogeneous  but overall within normal limits. No worrisome osseous lesions or evidence for metastatic disease. Degenerative reactive endplate changes noted about the L4-5 interspace. No other abnormal marrow edema or enhancement. Conus medullaris and cauda equina: Conus extends to the L1 level. Conus and cauda equina appear normal. Paraspinal and other soft tissues: Paraspinous soft tissues within normal limits. Subcentimeter simple left renal cyst noted, benign in appearance, no follow-up imaging recommended. Disc levels: L1-2: Disc desiccation with mild diffuse disc bulge. Reactive endplate spurring. Mild bilateral facet hypertrophy. No spinal stenosis. Mild bilateral L1 foraminal stenosis. L2-3: Degenerative intervertebral disc space narrowing with diffuse disc bulge and disc desiccation. Reactive endplate spurring, worse on the right. Mild bilateral facet hypertrophy. Resultant mild spinal stenosis. Mild bilateral L2 foraminal narrowing. L3-4: Degenerative intervertebral disc space narrowing with diffuse disc bulge and disc desiccation. Reactive endplate spurring. Superimposed left  subarticular disc extrusion with inferior migration (series 9, image 24). Probable impingement of the descending left L4 nerve root. This is likely the symptomatic finding. Superimposed mild facet and ligament flavum hypertrophy. Resultant mild canal with moderate left lateral recess stenosis. Mild to moderate bilateral L3 foraminal narrowing. L4-5: Degenerative disc space narrowing with disc desiccation and diffuse disc bulge. Superimposed central disc protrusion with slight inferior migration, slightly asymmetric to the right. Mild facet and ligament flavum hypertrophy. Mild epidural lipomatosis. Resultant moderate to severe canal with bilateral subarticular stenosis. Moderate to severe bilateral L4 foraminal narrowing. L5-S1: Mild disc bulge. Severe left with moderate right facet arthrosis. Mild epidural lipomatosis. No spinal stenosis. Mild to moderate bilateral L5 foraminal narrowing. IMPRESSION: 1. Left subarticular disc extrusion with inferior migration at L3-4, likely impinging the descending left L4 nerve root. This is suspected to be the symptomatic finding. 2. Central disc protrusion with facet hypertrophy at L4-5 with resultant moderate to severe canal and bilateral subarticular stenosis, with moderate to severe bilateral L4 foraminal narrowing. 3. Degenerative disc bulging with facet hypertrophy at L2-3 and L5-S1 with resultant mild to moderate bilateral L3 and L5 foraminal stenosis. 4. No evidence for metastatic disease within the lumbar spine. Electronically Signed   By: Rise Mu M.D.   On: 10/12/2023 02:30   CT ABDOMEN PELVIS W CONTRAST Result Date: 10/11/2023 CLINICAL DATA:  Prostate cancer, low back pain EXAM: CT ABDOMEN AND PELVIS WITH CONTRAST TECHNIQUE: Multidetector CT imaging of the abdomen and pelvis was performed using the standard protocol following bolus administration of intravenous contrast. RADIATION DOSE REDUCTION: This exam was performed according to the departmental  dose-optimization program which includes automated exposure control, adjustment of the mA and/or kV according to patient size and/or use of iterative reconstruction technique. CONTRAST:  OMNIPAQUE IOHEXOL 300 MG/ML  SOLN COMPARISON:  08/27/2021 FINDINGS: Lower chest: No acute abnormality. Hepatobiliary: Mild hepatic steatosis. No enhancing intrahepatic mass. No intra or extrahepatic biliary ductal dilation. Gallbladder unremarkable. Pancreas: Unremarkable Spleen: Unremarkable Adrenals/Urinary Tract: Adrenal glands are unremarkable. Kidneys are normal, without renal calculi, focal lesion, or hydronephrosis. Bladder is unremarkable. Stomach/Bowel: Stomach is within normal limits. Appendix appears normal. No evidence of bowel wall thickening, distention, or inflammatory changes. Vascular/Lymphatic: Aortic atherosclerosis. No enlarged abdominal or pelvic lymph nodes. Reproductive: Status post prostatectomy. Other: Tiny fat containing umbilical and right inguinal hernias. Musculoskeletal: Healed right rib fractures. No acute bone abnormality. No lytic or blastic bone lesion. IMPRESSION: 1. No acute intra-abdominal pathology identified. No definite radiographic evidence of metastatic disease. 2. Mild hepatic steatosis. 3. Status post prostatectomy. Aortic Atherosclerosis (ICD10-I70.0). Electronically Signed   By: Lyda Kalata.D.  On: 10/11/2023 23:03   CT Lumbar Spine Wo Contrast Result Date: 10/11/2023 CLINICAL DATA:  Low back pain, cancer suspected. History of prostate cancer. EXAM: CT LUMBAR SPINE WITHOUT CONTRAST TECHNIQUE: Multidetector CT imaging of the lumbar spine was performed without intravenous contrast administration. Multiplanar CT image reconstructions were also generated. RADIATION DOSE REDUCTION: This exam was performed according to the departmental dose-optimization program which includes automated exposure control, adjustment of the mA and/or kV according to patient size and/or use of  iterative reconstruction technique. COMPARISON:  Same day CT abdomen and pelvis and CT chest abdomen and pelvis 08/27/2021 FINDINGS: Segmentation: 5 lumbar type vertebrae. Alignment: Normal. Vertebrae: No acute fracture or focal pathologic process. Paraspinal and other soft tissues: See separate report for findings in the abdomen or pelvis. Disc levels: Multilevel spondylosis with bulky anterior osteophytes, disc space height loss, and degenerative endplate changes greatest at L2-L3 and L4-L5 where it is moderate. Posterior disc bulges cause multilevel mild effacement of the ventral thecal sac. No severe spinal canal narrowing. Neural foraminal narrowing is greatest bilaterally at L4-L5 and L5-S1 where it is moderate IMPRESSION: Multilevel degenerative spondylosis without acute fracture or sclerotic metastasis. Electronically Signed   By: Minerva Fester M.D.   On: 10/11/2023 23:02   DG CHEST PORT 1 VIEW Result Date: 10/11/2023 CLINICAL DATA:  Right arm and bilateral lower extremity weakness EXAM: PORTABLE CHEST 1 VIEW COMPARISON:  02/18/2012 FINDINGS: The heart size and mediastinal contours are within normal limits. Both lungs are clear. The visualized skeletal structures are unremarkable. IMPRESSION: No active disease. Electronically Signed   By: Sharlet Salina M.D.   On: 10/11/2023 22:32   CT HEAD WO CONTRAST ( ) Result Date: 10/11/2023 CLINICAL DATA:  Neuro deficit, acute, stroke suspected EXAM: CT HEAD WITHOUT CONTRAST TECHNIQUE: Contiguous axial images were obtained from the base of the skull through the vertex without intravenous contrast. RADIATION DOSE REDUCTION: This exam was performed according to the departmental dose-optimization program which includes automated exposure control, adjustment of the mA and/or kV according to patient size and/or use of iterative reconstruction technique. COMPARISON:  None Available. FINDINGS: Brain: Right frontoparietal and temporal encephalomalacia underlying  craniectomy defect. No acute intracranial abnormality. Specifically, no hemorrhage, hydrocephalus, mass lesion, acute infarction, or significant intracranial injury. Vascular: No hyperdense vessel or unexpected calcification. Skull: No acute calvarial abnormality. Right frontotemporal craniectomy defect. Sinuses/Orbits: No acute findings Other: None IMPRESSION: Encephalomalacia in the right frontal, parietal and temporal lobes underlying craniectomy defects. No acute intracranial abnormality. Electronically Signed   By: Charlett Nose M.D.   On: 10/11/2023 21:06     Subjective: No acute issues or events overnight, up with physical therapy this morning requiring +2 PT assistance, denies nausea vomiting diarrhea constipation headache fevers chills or chest pain   Discharge Exam: Vitals:   10/14/23 2114 10/15/23 0543  BP: 128/72 (!) 160/79  Pulse: 74 69  Resp: 20 16  Temp: 98.2 F (36.8 C) 98.1 F (36.7 C)  SpO2: 100% 97%   Vitals:   10/14/23 1026 10/14/23 1203 10/14/23 2114 10/15/23 0543  BP: (!) 151/85 (!) 140/63 128/72 (!) 160/79  Pulse: 82 78 74 69  Resp: 16 16 20 16   Temp: (!) 97.4 F (36.3 C) 98.3 F (36.8 C) 98.2 F (36.8 C) 98.1 F (36.7 C)  TempSrc: Oral Oral Oral Oral  SpO2:  97% 100% 97%  Weight:      Height:        General: Pt is alert, awake, not in acute distress Cardiovascular: RRR,  S1/S2 +, no rubs, no gallops Respiratory: CTA bilaterally, no wheezing, no rhonchi Abdominal: Soft, NT, ND, bowel sounds + Extremities: no edema, no cyanosis, left lower extremity strength 2 out of 5    The results of significant diagnostics from this hospitalization (including imaging, microbiology, ancillary and laboratory) are listed below for reference.     Microbiology: Recent Results (from the past 240 hours)  SARS Coronavirus 2 by RT PCR (hospital order, performed in [facility] hospital lab) *cepheid single result test* Urine, Clean Catch     Status: None   Collection  Time: 10/11/23 11:32 PM   Specimen: Urine, Clean Catch; Nasal Swab  Result Value Ref Range Status   SARS Coronavirus 2 by RT PCR NEGATIVE NEGATIVE Final    Comment: (NOTE) SARS-CoV-2 target nucleic acids are NOT DETECTED.  The SARS-CoV-2 RNA is generally detectable in upper and lower respiratory specimens during the acute phase of infection. The lowest concentration of SARS-CoV-2 viral copies this assay can detect is 250 copies / mL. A negative result does not preclude SARS-CoV-2 infection and should not be used as the sole basis for treatment or other patient management decisions.  A negative result may occur with improper specimen collection / handling, submission of specimen other than nasopharyngeal swab, presence of viral mutation(s) within the areas targeted by this assay, and inadequate number of viral copies (<250 copies / mL). A negative result must be combined with clinical observations, patient history, and epidemiological information.  Fact Sheet for Patients:   RoadLapTop.co.za  Fact Sheet for Healthcare Providers: http://kim-miller.com/  This test is not yet approved or  cleared by the [place] FDA and has been authorized for detection and/or diagnosis of SARS-CoV-2 by FDA under an Emergency Use Authorization (EUA).  This EUA will remain in effect (meaning this test can be used) for the duration of the COVID-19 declaration under Section 564(b)(1) of the Act, 21 U.S.C. section 360bbb-3(b)(1), unless the authorization is terminated or revoked sooner.  Performed at [facility], [address] [place], [place] [address]      Labs:  Basic Metabolic Panel: Recent Labs  Lab 10/11/23 1802 10/12/23 1120  NA 136 134*  K 3.2* 3.3*  CL 105 102  CO2 20* 23  GLUCOSE 119* 112*  BUN 20 19  CREATININE 1.19 1.18  CALCIUM 8.5* 8.5*  MG 2.1 2.4  PHOS 3.5 3.5   Liver Function Tests: Recent Labs   Lab 10/11/23 1802 10/12/23 1120  AST 27 17  ALT 14 10  ALKPHOS 125 119  BILITOT 0.4 <0.2  PROT 6.9 6.9  ALBUMIN 3.3* 3.3*   CBC: Recent Labs  Lab 10/11/23 1802 10/12/23 1120  WBC 10.6* 7.7  NEUTROABS 8.6*  --   HGB 8.6* 8.1*  HCT 26.2* 26.0*  MCV 86.8 91.5  PLT 296 273   Cardiac Enzymes: Recent Labs  Lab 10/11/23 1802  CKTOTAL 92   Anemia work up No results for input(s): "VITAMINB12", "FOLATE", "FERRITIN", "TIBC", "IRON", "RETICCTPCT" in the last 72 hours.  Urinalysis    Component Value Date/Time   COLORURINE YELLOW 10/11/2023 2318   APPEARANCEUR HAZY (A) 10/11/2023 2318   LABSPEC 1.016 10/11/2023 2318   PHURINE 7.0 10/11/2023 2318   GLUCOSEU NEGATIVE 10/11/2023 2318   HGBUR NEGATIVE 10/11/2023 2318   BILIRUBINUR NEGATIVE 10/11/2023 2318   KETONESUR NEGATIVE 10/11/2023 2318   PROTEINUR NEGATIVE 10/11/2023 2318   NITRITE NEGATIVE 10/11/2023 2318   LEUKOCYTESUR NEGATIVE 10/11/2023 2318   Sepsis Labs Recent Labs  Lab 10/11/23 1802 10/12/23 1120  WBC 10.6* 7.7   Microbiology Recent Results (from the past 240 hours)  SARS Coronavirus 2 by RT PCR (hospital order, performed in [facility] hospital lab) *cepheid single result test* Urine, Clean Catch     Status: None   Collection Time: 10/11/23 11:32 PM   Specimen: Urine, Clean Catch; Nasal Swab  Result Value Ref Range Status   SARS Coronavirus 2 by RT PCR NEGATIVE NEGATIVE Final    Comment: (NOTE) SARS-CoV-2 target nucleic acids are NOT DETECTED.  The SARS-CoV-2 RNA is generally detectable in upper and lower respiratory specimens during the acute phase of infection. The lowest concentration of SARS-CoV-2 viral copies this assay can detect is 250 copies / mL. A negative result does not preclude SARS-CoV-2 infection and should not be used as the sole basis for treatment or other patient management decisions.  A negative result may occur with improper specimen collection / handling, submission of specimen  other than nasopharyngeal swab, presence of viral mutation(s) within the areas targeted by this assay, and inadequate number of viral copies (<250 copies / mL). A negative result must be combined with clinical observations, patient history, and epidemiological information.  Fact Sheet for Patients:   RoadLapTop.co.za  Fact Sheet for Healthcare Providers: http://kim-miller.com/  This test is not yet approved or  cleared by the [place] FDA and has been authorized for detection and/or diagnosis of SARS-CoV-2 by FDA under an Emergency Use Authorization (EUA).  This EUA will remain in effect (meaning this test can be used) for the duration of the COVID-19 declaration under Section 564(b)(1) of the Act, 21 U.S.C. section 360bbb-3(b)(1), unless the authorization is terminated or revoked sooner.  Performed at [facility], [address], [place], [place] [address]      Time coordinating discharge: Over 30 minutes  SIGNED:   Azucena Fallen, DO [facility] 10/15/2023, 1:08 PM Pager   If 7PM-7AM, please contact night-coverage [email or web address]

## 2023-10-19 ENCOUNTER — Inpatient Hospital Stay: Payer: Medicare PPO | Admitting: Hematology and Oncology

## 2023-10-19 ENCOUNTER — Other Ambulatory Visit: Payer: Medicare PPO

## 2023-10-19 ENCOUNTER — Inpatient Hospital Stay: Payer: Medicare PPO | Attending: Hematology and Oncology

## 2023-10-19 ENCOUNTER — Inpatient Hospital Stay: Payer: Medicare PPO

## 2023-10-19 ENCOUNTER — Ambulatory Visit: Payer: Medicare PPO | Admitting: Hematology and Oncology

## 2023-11-10 ENCOUNTER — Ambulatory Visit: Payer: Medicare PPO | Admitting: Orthopaedic Surgery

## 2023-11-19 ENCOUNTER — Ambulatory Visit: Payer: Medicare PPO | Admitting: Orthopaedic Surgery

## 2023-11-19 ENCOUNTER — Other Ambulatory Visit (INDEPENDENT_AMBULATORY_CARE_PROVIDER_SITE_OTHER): Payer: Self-pay

## 2023-11-19 ENCOUNTER — Encounter: Payer: Self-pay | Admitting: Orthopaedic Surgery

## 2023-11-19 ENCOUNTER — Telehealth: Payer: Self-pay

## 2023-11-19 ENCOUNTER — Encounter: Payer: Self-pay | Admitting: Hematology and Oncology

## 2023-11-19 DIAGNOSIS — M25562 Pain in left knee: Secondary | ICD-10-CM | POA: Diagnosis not present

## 2023-11-19 DIAGNOSIS — G8929 Other chronic pain: Secondary | ICD-10-CM

## 2023-11-19 DIAGNOSIS — M25561 Pain in right knee: Secondary | ICD-10-CM

## 2023-11-19 MED ORDER — LIDOCAINE HCL 1 % IJ SOLN
2.0000 mL | INTRAMUSCULAR | Status: AC | PRN
  Administered 2023-11-19: 2 mL

## 2023-11-19 MED ORDER — METHYLPREDNISOLONE ACETATE 40 MG/ML IJ SUSP
40.0000 mg | INTRAMUSCULAR | Status: AC | PRN
  Administered 2023-11-19: 40 mg via INTRA_ARTICULAR

## 2023-11-19 MED ORDER — BUPIVACAINE HCL 0.5 % IJ SOLN
2.0000 mL | INTRAMUSCULAR | Status: AC | PRN
  Administered 2023-11-19: 2 mL via INTRA_ARTICULAR

## 2023-11-19 NOTE — Telephone Encounter (Signed)
 Please submit for bil knee gel inj's. Thank you.

## 2023-11-19 NOTE — Progress Notes (Signed)
 Office Visit Note   Patient: Joseph Rangel           Date of Birth: July 24, 1946           MRN: [id number] Visit Date: 11/19/2023              Requested by: Joseph Martyr, MD [address] [place],  [place] [address] PCP: Joseph Mires, MD   Assessment & Plan: Visit Diagnoses:  1. Chronic pain of both knees     Plan: Mr. Joseph Rangel is a 78 year old gentleman with bilateral knee DJD.  This is fairly advanced.  He is fairly deconditioned as well.  He has foot drop caused by lumbar radiculopathy and he also has significant quad weakness likely from lumbar radiculopathy.  He is not a great surgical candidate currently.  Treatment options explained and we will do cortisone injections today.  [employer] pamphlet provided.  Questions encouraged and answered.  Follow-up as needed.  Follow-Up Instructions: No follow-ups on file.   Orders:  Orders Placed This Encounter  Procedures   XR KNEE 3 VIEW LEFT   XR KNEE 3 VIEW RIGHT   No orders of the defined types were placed in this encounter.     Procedures: Large Joint Inj: bilateral knee on 11/19/2023 11:00 AM Indications: pain Details: 22 G needle  Arthrogram: No  Medications (Right): 2 mL lidocaine 1 %; 2 mL bupivacaine 0.5 %; 40 mg methylPREDNISolone acetate 40 MG/ML Medications (Left): 2 mL lidocaine 1 %; 2 mL bupivacaine 0.5 %; 40 mg methylPREDNISolone acetate 40 MG/ML Outcome: tolerated well, no immediate complications Patient was prepped and draped in the usual sterile fashion.       Clinical Data: No additional findings.   Subjective: Chief Complaint  Patient presents with   Right Knee - Pain   Left Knee - Pain    HPI Patient is a 78 year old gentleman here for evaluation of bilateral knee pain worse on the left for over a year.  The pain is getting worse.  He usually ambulates with a rolling walker and a cane.  He is basically homebound.  He is experiencing giving way with the leg.  He does have significant  degenerative lumbar spine.  Review of Systems  Constitutional: Negative.   HENT: Negative.    Eyes: Negative.   Respiratory: Negative.    Cardiovascular: Negative.   Gastrointestinal: Negative.   Endocrine: Negative.   Genitourinary: Negative.   Musculoskeletal:  Positive for arthralgias.  Skin: Negative.   Allergic/Immunologic: Negative.   Neurological: Negative.   Hematological: Negative.   Psychiatric/Behavioral: Negative.    All other systems reviewed and are negative.    Objective: Vital Signs: There were no vitals taken for this visit.  Physical Exam Vitals and nursing note reviewed.  Constitutional:      Appearance: He is well-developed.  HENT:     Head: Normocephalic and atraumatic.  Eyes:     Pupils: Pupils are equal, round, and reactive to light.  Pulmonary:     Effort: Pulmonary effort is normal.  Abdominal:     Palpations: Abdomen is soft.  Musculoskeletal:        General: Normal range of motion.     Cervical back: Neck supple.  Skin:    General: Skin is warm.  Neurological:     Mental Status: He is alert and oriented to person, place, and time.  Psychiatric:        Behavior: Behavior normal.  Thought Content: Thought content normal.        Judgment: Judgment normal.     Ortho Exam Examination of bilateral lower extremities shows significant weakness and quad function.  He has atrophy to the left quad most notably.  No joint effusion of the knees.  Pain and crepitus throughout range of motion.  Collaterals are stable. Specialty Comments:  No specialty comments available.  Imaging: XR KNEE 3 VIEW RIGHT Result Date: 11/19/2023 X-rays demonstrate severe osteoarthritis.  Bone-on-bone joint space narrowing of the patellofemoral compartment.  XR KNEE 3 VIEW LEFT Result Date: 11/19/2023 X-rays of the left knee show advanced tricompartmental degenerative joint disease.  Bone-on-bone joint space narrowing of the medial and patellofemoral  compartments.    PMFS History: Patient Active Problem List   Diagnosis Date Noted   Tobacco abuse 10/12/2023   Left leg weakness 10/11/2023   Essential hypertension 10/11/2023   Hyponatremia 06/17/2023   Generalized weakness 06/17/2023   Fall at home, initial encounter 06/17/2023   Normocytic normochromic anemia 06/25/2021   Hypokalemia 03/07/2021   AKI (acute kidney injury) (HCC) 01/31/2021   Right leg pain 01/31/2021   Prostate cancer metastatic to intraabdominal lymph node (HCC) 10/02/2020   Past Medical History:  Diagnosis Date   Arthritis    Cancer [facility])    prostate    Frequency of urination    Hypertension    Seizures (HCC)    after brain surg - none in past 20 yrs   Speech disorder    due to brain surg    No family history on file.  Past Surgical History:  Procedure Laterality Date   BRAIN SURGERY     BLOOD CLOT REMOVED 1980   MASS EXCISION  02/24/2012   Procedure: MINOR EXCISION OF MASS;  Surgeon: Valetta Fuller, MD;  Location: [facility];  Service: Urology;;  excision of neck lesion   ROBOT ASSISTED LAPAROSCOPIC RADICAL PROSTATECTOMY  02/24/2012   Procedure: ROBOTIC ASSISTED LAPAROSCOPIC RADICAL PROSTATECTOMY;  Surgeon: Valetta Fuller, MD;  Location: [facility];  Service: Urology;  Laterality: N/A;          Social History   Occupational History   Not on file  Tobacco Use   Smoking status: Every Day    Types: Cigarettes   Smokeless tobacco: Never   Tobacco comments:    2 cigarettes per day  Vaping Use   Vaping status: Never Used  Substance and Sexual Activity   Alcohol use: No   Drug use: No   Sexual activity: Not on file

## 2023-11-21 ENCOUNTER — Other Ambulatory Visit: Payer: Self-pay | Admitting: Hematology and Oncology

## 2023-11-21 DIAGNOSIS — C772 Secondary and unspecified malignant neoplasm of intra-abdominal lymph nodes: Secondary | ICD-10-CM

## 2023-11-21 DIAGNOSIS — C61 Malignant neoplasm of prostate: Secondary | ICD-10-CM

## 2023-11-22 ENCOUNTER — Encounter: Payer: Self-pay | Admitting: Hematology and Oncology

## 2023-11-22 ENCOUNTER — Telehealth: Payer: Self-pay | Admitting: *Deleted

## 2023-11-22 NOTE — Telephone Encounter (Signed)
 This RN called and spoke with pt's wife- reviewed need for visit to discuss continuing plan of care.  She verbalized understanding - appt made while on the phone with her for lab - visit and injection ( if needed)

## 2023-11-29 ENCOUNTER — Inpatient Hospital Stay (HOSPITAL_BASED_OUTPATIENT_CLINIC_OR_DEPARTMENT_OTHER): Admitting: Hematology and Oncology

## 2023-11-29 ENCOUNTER — Inpatient Hospital Stay: Attending: Hematology and Oncology

## 2023-11-29 ENCOUNTER — Inpatient Hospital Stay

## 2023-11-29 VITALS — BP 141/86 | HR 101 | Temp 97.3°F | Resp 17 | Wt 157.3 lb

## 2023-11-29 DIAGNOSIS — C772 Secondary and unspecified malignant neoplasm of intra-abdominal lymph nodes: Secondary | ICD-10-CM | POA: Diagnosis not present

## 2023-11-29 DIAGNOSIS — G40909 Epilepsy, unspecified, not intractable, without status epilepticus: Secondary | ICD-10-CM | POA: Insufficient documentation

## 2023-11-29 DIAGNOSIS — C773 Secondary and unspecified malignant neoplasm of axilla and upper limb lymph nodes: Secondary | ICD-10-CM | POA: Insufficient documentation

## 2023-11-29 DIAGNOSIS — Z79899 Other long term (current) drug therapy: Secondary | ICD-10-CM | POA: Diagnosis not present

## 2023-11-29 DIAGNOSIS — M170 Bilateral primary osteoarthritis of knee: Secondary | ICD-10-CM | POA: Insufficient documentation

## 2023-11-29 DIAGNOSIS — R000 Tachycardia, unspecified: Secondary | ICD-10-CM | POA: Insufficient documentation

## 2023-11-29 DIAGNOSIS — D649 Anemia, unspecified: Secondary | ICD-10-CM

## 2023-11-29 DIAGNOSIS — D509 Iron deficiency anemia, unspecified: Secondary | ICD-10-CM | POA: Diagnosis not present

## 2023-11-29 DIAGNOSIS — Z79818 Long term (current) use of other agents affecting estrogen receptors and estrogen levels: Secondary | ICD-10-CM | POA: Insufficient documentation

## 2023-11-29 DIAGNOSIS — F17210 Nicotine dependence, cigarettes, uncomplicated: Secondary | ICD-10-CM | POA: Diagnosis not present

## 2023-11-29 DIAGNOSIS — I10 Essential (primary) hypertension: Secondary | ICD-10-CM | POA: Diagnosis not present

## 2023-11-29 DIAGNOSIS — Z7952 Long term (current) use of systemic steroids: Secondary | ICD-10-CM | POA: Diagnosis not present

## 2023-11-29 DIAGNOSIS — C61 Malignant neoplasm of prostate: Secondary | ICD-10-CM | POA: Insufficient documentation

## 2023-11-29 LAB — COMPREHENSIVE METABOLIC PANEL
ALT: 6 U/L (ref 0–44)
AST: 11 U/L — ABNORMAL LOW (ref 15–41)
Albumin: 4.1 g/dL (ref 3.5–5.0)
Alkaline Phosphatase: 171 U/L — ABNORMAL HIGH (ref 38–126)
Anion gap: 8 (ref 5–15)
BUN: 23 mg/dL (ref 8–23)
CO2: 24 mmol/L (ref 22–32)
Calcium: 8.7 mg/dL — ABNORMAL LOW (ref 8.9–10.3)
Chloride: 105 mmol/L (ref 98–111)
Creatinine, Ser: 1.23 mg/dL (ref 0.61–1.24)
GFR, Estimated: >60 mL/min (ref >60)
Glucose, Bld: 154 mg/dL — ABNORMAL HIGH (ref 70–99)
Potassium: 4 mmol/L (ref 3.5–5.1)
Sodium: 137 mmol/L (ref 135–145)
Total Bilirubin: 0.2 mg/dL (ref 0.0–1.2)
Total Protein: 7.2 g/dL (ref 6.5–8.1)

## 2023-11-29 LAB — CBC WITH DIFFERENTIAL/PLATELET
Abs Immature Granulocytes: 0.17 10*3/uL — ABNORMAL HIGH (ref 0.00–0.07)
Basophils Absolute: 0.1 10*3/uL (ref 0.0–0.1)
Basophils Relative: 1 %
Eosinophils Absolute: 0.1 10*3/uL (ref 0.0–0.5)
Eosinophils Relative: 1 %
HCT: 27.9 % — ABNORMAL LOW (ref 39.0–52.0)
Hemoglobin: 8.8 g/dL — ABNORMAL LOW (ref 13.0–17.0)
Immature Granulocytes: 1 %
Lymphocytes Relative: 11 %
Lymphs Abs: 1.4 10*3/uL (ref 0.7–4.0)
MCH: 27.5 pg (ref 26.0–34.0)
MCHC: 31.5 g/dL (ref 30.0–36.0)
MCV: 87.2 fL (ref 80.0–100.0)
Monocytes Absolute: 0.8 10*3/uL (ref 0.1–1.0)
Monocytes Relative: 6 %
Neutro Abs: 10.9 10*3/uL — ABNORMAL HIGH (ref 1.7–7.7)
Neutrophils Relative %: 80 %
Platelets: 371 10*3/uL (ref 150–400)
RBC: 3.2 MIL/uL — ABNORMAL LOW (ref 4.22–5.81)
RDW: 14.3 % (ref 11.5–15.5)
WBC: 13.5 10*3/uL — ABNORMAL HIGH (ref 4.0–10.5)
nRBC: 0 % (ref 0.0–0.2)

## 2023-11-29 MED ORDER — LEUPROLIDE ACETATE (3 MONTH) 22.5 MG ~~LOC~~ KIT
22.5000 mg | PACK | Freq: Once | SUBCUTANEOUS | Status: AC
  Administered 2023-11-29: 22.5 mg via SUBCUTANEOUS
  Filled 2023-11-29: qty 22.5

## 2023-11-29 NOTE — Progress Notes (Signed)
 [facility] [phone]   [phone]  PROGRESS NOTE  Patient Care Team: [name], MD as PCP - General (Family Medicine)  Hematological/Oncological History  # Metastatic Prostate Cancer w/ Spread to Intraabdominal Lymph Nodes --previously followed by Dr. Al Pimple. Currently on abiraterone therapy 06/18/2021: PSA <0.01  Interval History:  Discussed the use of AI scribe software for clinical note transcription with the patient, who gave verbal consent to proceed.  History of Present Illness    Joseph Rangel 78 y.o. male with medical history significant for metastatic prostate cancer who presents for a follow up visit.   Joseph Rangel, a patient with a history of prostate cancer on Zytiga and prednisone, presents for a routine follow-up.  He missed his last appointment since he is at the rehabilitation after his hospital discharge from January.  His daughter was present with him, however she was on the phone call simultaneously so she could not participate in the conversation.  He tells me that he had some gel shots in his knees for arthritis.  He is taking Zytiga and prednisone as prescribed.  Besides his ongoing knee issues and limited mobility related to this, he denies he is understandably reluctant to get his Lupron shots. He is however ok to do this.  Rest of the pertinent 10 point ROS reviewed and negative  MEDICAL HISTORY:  Past Medical History:  Diagnosis Date   Arthritis    Cancer (HCC)    prostate    Frequency of urination    Hypertension    Seizures (HCC)    after brain surg - none in past 20 yrs   Speech disorder    due to brain surg    SURGICAL HISTORY: Past Surgical History:  Procedure Laterality Date   BRAIN SURGERY     BLOOD CLOT REMOVED 1980   MASS EXCISION  02/24/2012   Procedure: MINOR EXCISION OF MASS;  Surgeon: Valetta Fuller, MD;  Location: [facility];  Service: Urology;;  excision of neck lesion   ROBOT ASSISTED  LAPAROSCOPIC RADICAL PROSTATECTOMY  02/24/2012   Procedure: ROBOTIC ASSISTED LAPAROSCOPIC RADICAL PROSTATECTOMY;  Surgeon: Valetta Fuller, MD;  Location: [facility];  Service: Urology;  Laterality: N/A;           SOCIAL HISTORY: Social History   Socioeconomic History   Marital status: Married    Spouse name: Not on file   Number of children: Not on file   Years of education: Not on file   Highest education level: Not on file  Occupational History   Not on file  Tobacco Use   Smoking status: Every Day    Types: Cigarettes   Smokeless tobacco: Never   Tobacco comments:    2 cigarettes per day  Vaping Use   Vaping status: Never Used  Substance and Sexual Activity   Alcohol use: No   Drug use: No   Sexual activity: Not on file  Other Topics Concern   Not on file  Social History Narrative   Not on file   Social Drivers of Health   Financial Resource Strain: Not on file  Food Insecurity: No Food Insecurity (10/12/2023)   Hunger Vital Sign    Worried About Running Out of Food in the Last Year: Never true    Ran Out of Food in the Last Year: Never true  Transportation Needs: No Transportation Needs (10/12/2023)   PRAPARE - Transportation    Lack of Transportation (Medical): No    Lack  of Transportation (Non-Medical): No  Physical Activity: Not on file  Stress: Not on file  Social Connections: Unknown (10/12/2023)   Social Connection and Isolation Panel [NHANES]    Frequency of Communication with Friends and Family: Three times a week    Frequency of Social Gatherings with Friends and Family: More than three times a week    Attends Religious Services: Patient declined    Active Member of Clubs or Organizations: Yes    Attends Banker Meetings: More than 4 times per year    Marital Status: Patient declined  Intimate Partner Violence: Not At Risk (10/14/2023)   Humiliation, Afraid, Rape, and Kick questionnaire    Fear of Current or Ex-Partner: No    Emotionally  Abused: No    Physically Abused: No    Sexually Abused: No    FAMILY HISTORY: No family history on file.  ALLERGIES:  has no known allergies.  MEDICATIONS:  Current Outpatient Medications  Medication Sig Dispense Refill   abiraterone acetate (ZYTIGA) 250 MG tablet TAKE 4 TABLETS BY MOUTH DAILY ON AN EMPTY STOMACH, 1 HR BEFORE OR 2 HRS AFTER A MEAL 120 tablet 2   acetaminophen (TYLENOL) 325 MG tablet Take 2 tablets (650 mg total) by mouth every 6 (six) hours as needed for mild pain (or Fever >/= 101).     atenolol (TENORMIN) 25 MG tablet Take 25 mg by mouth daily.     buprenorphine (BUTRANS) 20 MCG/HR PTWK Place 1 patch onto the skin once a week. 4 patch 0   cyanocobalamin 1000 MCG tablet Take 1 tablet (1,000 mcg total) by mouth daily.     hydrochlorothiazide (HYDRODIURIL) 25 MG tablet Take 25 mg by mouth daily.     HYDROcodone-acetaminophen (NORCO) 10-325 MG tablet Take 1 tablet by mouth 4 (four) times daily as needed for severe pain (pain score 7-10). 10 tablet 0   olmesartan (BENICAR) 40 MG tablet Take 40 mg by mouth daily.     omeprazole (PRILOSEC) 40 MG capsule Take 40 mg by mouth daily.     phenytoin (DILANTIN) 100 MG ER capsule Take 300 mg by mouth at bedtime.     potassium chloride SA (KLOR-CON M) 20 MEQ tablet Take 1 tablet (20 mEq total) by mouth daily. Can substitute to the packet     predniSONE (DELTASONE) 50 MG tablet Take 1 tablet (50 mg total) by mouth daily with breakfast. 4 tablet 0   senna-docusate (SENOKOT-S) 8.6-50 MG tablet Take 1 tablet by mouth at bedtime as needed for mild constipation.     verapamil (VERELAN PM) 360 MG 24 hr capsule Take 360 mg by mouth daily.     No current facility-administered medications for this visit.    REVIEW OF SYSTEMS:   Constitutional: ( - ) fevers, ( - )  chills , ( - ) night sweats Eyes: ( - ) blurriness of vision, ( - ) double vision, ( - ) watery eyes Ears, nose, mouth, throat, and face: ( - ) mucositis, ( - ) sore  throat Respiratory: ( - ) cough, ( - ) dyspnea, ( - ) wheezes Cardiovascular: ( - ) palpitation, ( - ) chest discomfort, ( - ) lower extremity swelling Gastrointestinal:  ( - ) nausea, ( - ) heartburn, ( - ) change in bowel habits Skin: ( - ) abnormal skin rashes Lymphatics: ( - ) new lymphadenopathy, ( - ) easy bruising Neurological: ( - ) numbness, ( - ) tingling, ( - ) new weaknesses  Behavioral/Psych: ( - ) mood change, ( - ) new changes  All other systems were reviewed with the patient and are negative.  PHYSICAL EXAMINATION: ECOG PERFORMANCE STATUS: 2 - Symptomatic, <50% confined to bed  Vitals:   11/29/23 1034  BP: (!) 141/86  Pulse: (!) 101  Resp: 17  Temp: (!) 97.3 F (36.3 C)  SpO2: 100%      Filed Weights   11/29/23 1034  Weight: 157 lb 4.8 oz (71.4 kg)     Physical Exam Constitutional:      General: He is not in acute distress.    Appearance: Normal appearance.     Comments: Came with a walker   HENT:     Head: Normocephalic and atraumatic.  Cardiovascular:     Rate and Rhythm: Normal rate and regular rhythm.     Pulses: Normal pulses.     Heart sounds: Normal heart sounds.  Pulmonary:     Effort: Pulmonary effort is normal.     Breath sounds: Normal breath sounds. No wheezing.  Musculoskeletal:        General: No swelling or tenderness.  Neurological:     General: No focal deficit present.     Mental Status: He is alert.  Psychiatric:        Mood and Affect: Mood normal.      LABORATORY DATA:  I have reviewed the data as listed    Latest Ref Rng & Units 11/29/2023   10:08 AM 10/12/2023   11:20 AM 10/11/2023    6:02 PM  CBC  WBC 4.0 - 10.5 K/uL 13.5  7.7  10.6   Hemoglobin 13.0 - 17.0 g/dL 8.8  8.1  8.6   Hematocrit 39.0 - 52.0 % 27.9  26.0  26.2   Platelets 150 - 400 K/uL 371  273  296        Latest Ref Rng & Units 10/12/2023   11:20 AM 10/11/2023    6:02 PM 07/19/2023    8:27 AM  CMP  Glucose 70 - 99 mg/dL 295  621  308   BUN 8 -  23 mg/dL 19  20  19    Creatinine 0.61 - 1.24 mg/dL 6.57  8.46  9.62   Sodium 135 - 145 mmol/L 134  136  138   Potassium 3.5 - 5.1 mmol/L 3.3  3.2  3.7   Chloride 98 - 111 mmol/L 102  105  103   CO2 22 - 32 mmol/L 23  20  25    Calcium 8.9 - 10.3 mg/dL 8.5  8.5  9.3   Total Protein 6.5 - 8.1 g/dL 6.9  6.9  7.7   Total Bilirubin 0.0 - 1.2 mg/dL <9.5  0.4  0.3   Alkaline Phos 38 - 126 U/L 119  125  129   AST 15 - 41 U/L 17  27  10    ALT 0 - 44 U/L 10  14  7     RADIOGRAPHIC STUDIES: XR KNEE 3 VIEW RIGHT Result Date: 11/19/2023 X-rays demonstrate severe osteoarthritis.  Bone-on-bone joint space narrowing of the patellofemoral compartment.  XR KNEE 3 VIEW LEFT Result Date: 11/19/2023 X-rays of the left knee show advanced tricompartmental degenerative joint disease.  Bone-on-bone joint space narrowing of the medial and patellofemoral compartments.   ASSESSMENT & PLAN Damione Robideau 78 y.o. male with medical history significant for metastatic prostate cancer who presents for a follow up visit.  PMH significant for prostate adenocarcinoma Gleason 3+4, no LN involvement s.p robotic  prostatectomy in 2013, presented with peri aortic left iliac and peri rectal LN, with biopsy confirming metastatic prostate adenoca now on zytiga and lupron presents here for FU on his metastatic prostate cancer. Baseline PSA of 207.  # Metastatic Castrate Sensitive Prostate Cancer -- continue on Zytiga 1000mg  PO daily with prednisone -- Lupron every 3 months, dose planned today, overdue for this. -- PSA from today is pending, last PSA without any concerns for recurrence. ---He should continue on Zytiga with prednisone and lupron, follow up every 12 weeks. -- He did not have any bone metastasis hence he is not on bisphosphonate therapy.    # Normocytic normochromic anemia,  Received iron infusion, ferritin 300 times 3. Hemoglobin  at 8.8 g/dL, normocytic normochromic. -Continue to monitor. No indication for  transfusion  # sinus tachycardia. I dont think he is taking his tenormin. Recommended compliance to all his meds and follow up with his PCP.   No orders of the defined types were placed in this encounter.   All questions were answered. The patient knows to call the clinic with any problems, questions or concerns.  A total of more than 30 minutes were spent on this encounter with face-to-face time and non-face-to-face time, including preparing to see the patient, ordering tests and/or medications, counseling the patient and coordination of care as outlined above.   Rachel Moulds MD   11/29/2023 10:55 AM

## 2023-11-30 LAB — PSA, TOTAL AND FREE
PSA, Free Pct: UNDETERMINED %
PSA, Free: <0.02 ng/mL
Prostate Specific Ag, Serum: <0.1 ng/mL (ref 0.0–4.0)

## 2023-12-08 NOTE — Telephone Encounter (Signed)
 VOB submitted for Monovisc, bilateral knee

## 2023-12-27 ENCOUNTER — Other Ambulatory Visit: Payer: Self-pay | Admitting: Hematology and Oncology

## 2023-12-28 ENCOUNTER — Encounter: Payer: Self-pay | Admitting: Hematology and Oncology

## 2024-01-26 ENCOUNTER — Ambulatory Visit
Admission: RE | Admit: 2024-01-26 | Discharge: 2024-01-26 | Disposition: A | Discharge status: Home / Self Care | Source: Ambulatory Visit | Attending: Family Medicine | Admitting: Family Medicine

## 2024-01-26 ENCOUNTER — Other Ambulatory Visit: Payer: Self-pay | Admitting: Family Medicine

## 2024-01-26 DIAGNOSIS — M25562 Pain in left knee: Secondary | ICD-10-CM

## 2024-02-28 ENCOUNTER — Other Ambulatory Visit: Payer: Self-pay | Admitting: *Deleted

## 2024-02-28 ENCOUNTER — Telehealth: Payer: Self-pay

## 2024-02-28 DIAGNOSIS — C61 Malignant neoplasm of prostate: Secondary | ICD-10-CM

## 2024-02-28 DIAGNOSIS — C772 Secondary and unspecified malignant neoplasm of intra-abdominal lymph nodes: Secondary | ICD-10-CM

## 2024-02-28 NOTE — Telephone Encounter (Signed)
 Verbally confirmed appt for 6/10

## 2024-02-29 ENCOUNTER — Inpatient Hospital Stay: Attending: Hematology and Oncology

## 2024-02-29 ENCOUNTER — Other Ambulatory Visit: Payer: Self-pay | Admitting: *Deleted

## 2024-02-29 ENCOUNTER — Inpatient Hospital Stay

## 2024-02-29 ENCOUNTER — Inpatient Hospital Stay (HOSPITAL_BASED_OUTPATIENT_CLINIC_OR_DEPARTMENT_OTHER): Admitting: Hematology and Oncology

## 2024-02-29 VITALS — BP 132/75 | HR 81 | Temp 98.3°F | Resp 19 | Wt 153.2 lb

## 2024-02-29 DIAGNOSIS — C61 Malignant neoplasm of prostate: Secondary | ICD-10-CM | POA: Diagnosis not present

## 2024-02-29 DIAGNOSIS — D509 Iron deficiency anemia, unspecified: Secondary | ICD-10-CM

## 2024-02-29 DIAGNOSIS — R32 Unspecified urinary incontinence: Secondary | ICD-10-CM | POA: Diagnosis not present

## 2024-02-29 DIAGNOSIS — D649 Anemia, unspecified: Secondary | ICD-10-CM

## 2024-02-29 DIAGNOSIS — Z7952 Long term (current) use of systemic steroids: Secondary | ICD-10-CM | POA: Diagnosis not present

## 2024-02-29 DIAGNOSIS — C773 Secondary and unspecified malignant neoplasm of axilla and upper limb lymph nodes: Secondary | ICD-10-CM | POA: Diagnosis not present

## 2024-02-29 DIAGNOSIS — K921 Melena: Secondary | ICD-10-CM | POA: Diagnosis not present

## 2024-02-29 DIAGNOSIS — F17210 Nicotine dependence, cigarettes, uncomplicated: Secondary | ICD-10-CM | POA: Insufficient documentation

## 2024-02-29 DIAGNOSIS — C772 Secondary and unspecified malignant neoplasm of intra-abdominal lymph nodes: Secondary | ICD-10-CM

## 2024-02-29 LAB — CBC WITH DIFFERENTIAL (CANCER CENTER ONLY)
Abs Immature Granulocytes: 0.07 K/uL (ref 0.00–0.07)
Basophils Absolute: 0.1 K/uL (ref 0.0–0.1)
Basophils Relative: 1 %
Eosinophils Absolute: 0.1 K/uL (ref 0.0–0.5)
Eosinophils Relative: 1 %
HCT: 23.3 % — ABNORMAL LOW (ref 39.0–52.0)
Hemoglobin: 7 g/dL — ABNORMAL LOW (ref 13.0–17.0)
Immature Granulocytes: 1 %
Lymphocytes Relative: 15 %
Lymphs Abs: 1.4 K/uL (ref 0.7–4.0)
MCH: 24.1 pg — ABNORMAL LOW (ref 26.0–34.0)
MCHC: 30 g/dL (ref 30.0–36.0)
MCV: 80.1 fL (ref 80.0–100.0)
Monocytes Absolute: 0.6 K/uL (ref 0.1–1.0)
Monocytes Relative: 6 %
Neutro Abs: 6.9 K/uL (ref 1.7–7.7)
Neutrophils Relative %: 76 %
Platelet Count: 291 K/uL (ref 150–400)
RBC: 2.91 MIL/uL — ABNORMAL LOW (ref 4.22–5.81)
RDW: 15 % (ref 11.5–15.5)
WBC Count: 9.1 K/uL (ref 4.0–10.5)
nRBC: 0 % (ref 0.0–0.2)

## 2024-02-29 LAB — CMP (CANCER CENTER ONLY)
ALT: <5 U/L (ref 0–44)
AST: 9 U/L — ABNORMAL LOW (ref 15–41)
Albumin: 4 g/dL (ref 3.5–5.0)
Alkaline Phosphatase: 124 U/L (ref 38–126)
Anion gap: 8 (ref 5–15)
BUN: 16 mg/dL (ref 8–23)
CO2: 25 mmol/L (ref 22–32)
Calcium: 8.9 mg/dL (ref 8.9–10.3)
Chloride: 105 mmol/L (ref 98–111)
Creatinine: 1.18 mg/dL (ref 0.61–1.24)
GFR, Estimated: >60 mL/min (ref >60)
Glucose, Bld: 149 mg/dL — ABNORMAL HIGH (ref 70–99)
Potassium: 3.7 mmol/L (ref 3.5–5.1)
Sodium: 138 mmol/L (ref 135–145)
Total Bilirubin: 0.3 mg/dL (ref 0.0–1.2)
Total Protein: 7.2 g/dL (ref 6.5–8.1)

## 2024-02-29 LAB — FERRITIN: Ferritin: 13 ng/mL — ABNORMAL LOW (ref 24–336)

## 2024-02-29 LAB — SAMPLE TO BLOOD BANK

## 2024-02-29 LAB — PREPARE RBC (CROSSMATCH)

## 2024-02-29 MED ORDER — LEUPROLIDE ACETATE (3 MONTH) 22.5 MG ~~LOC~~ KIT
22.5000 mg | PACK | Freq: Once | SUBCUTANEOUS | Status: AC
  Administered 2024-02-29: 22.5 mg via SUBCUTANEOUS
  Filled 2024-02-29: qty 22.5

## 2024-02-29 MED ORDER — PREDNISONE 5 MG PO TABS: 5.0000 mg | ORAL_TABLET | Freq: Every day | ORAL | 3 refills | Status: DC

## 2024-02-29 NOTE — Progress Notes (Signed)
 Patient and daughter made aware to go back to lab to have more labs drawn before leaving today.  Both made aware to return on 03/02/2024 for blood transfusion at 2:00 P.M.

## 2024-02-29 NOTE — Progress Notes (Signed)
 [facility] Telephone:([phone]) [phone]   Fax:([phone]  PROGRESS NOTE  Patient Care Team: Wilburn Handler, MD as PCP - General (Family Medicine)  Hematological/Oncological History  # Metastatic Prostate Cancer w/ Spread to Intraabdominal Lymph Nodes --previously followed by Dr. Arno Bibles. Currently on abiraterone  therapy 06/18/2021: PSA <0.01  Interval History:  Discussed the use of AI scribe software for clinical note transcription with the patient, who gave verbal consent to proceed.  History of Present Illness    Joseph Rangel 78 y.o. male with medical history significant for metastatic prostate cancer who presents for a follow up visit.   [name], a patient with a history of prostate cancer on Zytiga  and prednisone , presents for a routine follow-up.  Discussed the use of AI scribe software for clinical note transcription with the patient, who gave verbal consent to proceed.  History of Present Illness Joseph Rangel is a 78 year old male with prostate cancer who presents with anemia and black stools. He is accompanied by his daughter, who is also his caregiver.  He has been experiencing black stools since this morning, with no bright red blood observed. He has not been taking any iron  supplements recently, with the last supplementation occurring in January. His hemoglobin levels are low, indicating anemia, which is more severe than before.  He has a history of prostate cancer and is currently on a treatment regimen that includes daily prednisone  and a zytiga  consisting of four pills a day, though he is unsure of the name. He receives a Lupron  shot every three months. His daughter assists with medication management, ensuring he takes his potassium and other medications regularly.  He experiences urinary incontinence, requiring the use of diapers, which he attributes to a previous treatment. No recent hospitalizations and no blood in his urine.  He has knee problems but  reports that his knee is 'coming out' and he has been able to walk without requiring recent hospital visits for his knee issues.   Rest of the pertinent 10 point ROS reviewed and negative  MEDICAL HISTORY:  Past Medical History:  Diagnosis Date   Arthritis    Cancer (HCC)    prostate    Frequency of urination    Hypertension    Seizures (HCC)    after brain surg - none in past 20 yrs   Speech disorder    due to brain surg    SURGICAL HISTORY: Past Surgical History:  Procedure Laterality Date   BRAIN SURGERY     BLOOD CLOT REMOVED 1980   MASS EXCISION  02/24/2012   Procedure: MINOR EXCISION OF MASS;  Surgeon: Livingston Rigg, MD;  Location: [facility];  Service: Urology;;  excision of neck lesion   ROBOT ASSISTED LAPAROSCOPIC RADICAL PROSTATECTOMY  02/24/2012   Procedure: ROBOTIC ASSISTED LAPAROSCOPIC RADICAL PROSTATECTOMY;  Surgeon: Livingston Rigg, MD;  Location: [facility];  Service: Urology;  Laterality: N/A;           SOCIAL HISTORY: Social History   Socioeconomic History   Marital status: Married    Spouse name: Not on file   Number of children: Not on file   Years of education: Not on file   Highest education level: Not on file  Occupational History   Not on file  Tobacco Use   Smoking status: Every Day    Types: Cigarettes   Smokeless tobacco: Never   Tobacco comments:    2 cigarettes per day  Vaping Use   Vaping status: Never  Used  Substance and Sexual Activity   Alcohol  use: No   Drug use: No   Sexual activity: Not on file  Other Topics Concern   Not on file  Social History Narrative   Not on file   Social Drivers of Health   Financial Resource Strain: Not on file  Food Insecurity: No Food Insecurity (10/12/2023)   Hunger Vital Sign    Worried About Running Out of Food in the Last Year: Never true    Ran Out of Food in the Last Year: Never true  Transportation Needs: No Transportation Needs (10/12/2023)   PRAPARE - Scientist, research (physical sciences) (Medical): No    Lack of Transportation (Non-Medical): No  Physical Activity: Not on file  Stress: Not on file  Social Connections: Unknown (10/12/2023)   Social Connection and Isolation Panel [NHANES]    Frequency of Communication with Friends and Family: Three times a week    Frequency of Social Gatherings with Friends and Family: More than three times a week    Attends Religious Services: Patient declined    Active Member of Clubs or Organizations: Yes    Attends Banker Meetings: More than 4 times per year    Marital Status: Patient declined  Intimate Partner Violence: Not At Risk (10/14/2023)   Humiliation, Afraid, Rape, and Kick questionnaire    Fear of Current or Ex-Partner: No    Emotionally Abused: No    Physically Abused: No    Sexually Abused: No    FAMILY HISTORY: No family history on file.  ALLERGIES:  has no known allergies.  MEDICATIONS:  Current Outpatient Medications  Medication Sig Dispense Refill   abiraterone  acetate (ZYTIGA ) 250 MG tablet TAKE 4 TABLETS BY MOUTH DAILY ON AN EMPTY STOMACH, 1 HR BEFORE OR 2 HRS AFTER A MEAL 120 tablet 2   acetaminophen  (TYLENOL ) 325 MG tablet Take 2 tablets (650 mg total) by mouth every 6 (six) hours as needed for mild pain (or Fever >/= 101).     atenolol  (TENORMIN ) 25 MG tablet Take 25 mg by mouth daily.     buprenorphine  (BUTRANS ) 20 MCG/HR PTWK Place 1 patch onto the skin once a week. 4 patch 0   cyanocobalamin  1000 MCG tablet Take 1 tablet (1,000 mcg total) by mouth daily.     hydrochlorothiazide  (HYDRODIURIL ) 25 MG tablet Take 25 mg by mouth daily.     HYDROcodone -acetaminophen  (NORCO) 10-325 MG tablet Take 1 tablet by mouth 4 (four) times daily as needed for severe pain (pain score 7-10). 10 tablet 0   olmesartan  (BENICAR ) 40 MG tablet Take 40 mg by mouth daily.     omeprazole (PRILOSEC) 40 MG capsule Take 40 mg by mouth daily.     phenytoin  (DILANTIN ) 100 MG ER capsule Take 300 mg by mouth at  bedtime.     potassium chloride  SA (KLOR-CON  M) 20 MEQ tablet Take 1 tablet (20 mEq total) by mouth daily. Can substitute to the packet     predniSONE  (DELTASONE ) 5 MG tablet TAKE 1 TABLET ONE TIME DAILY WITH BREAKFAST 90 tablet 3   predniSONE  (DELTASONE ) 50 MG tablet Take 1 tablet (50 mg total) by mouth daily with breakfast. 4 tablet 0   senna-docusate (SENOKOT-S) 8.6-50 MG tablet Take 1 tablet by mouth at bedtime as needed for mild constipation.     verapamil  (VERELAN  PM) 360 MG 24 hr capsule Take 360 mg by mouth daily.     No current facility-administered medications  for this visit.    REVIEW OF SYSTEMS:   Constitutional: ( - ) fevers, ( - )  chills , ( - ) night sweats Eyes: ( - ) blurriness of vision, ( - ) double vision, ( - ) watery eyes Ears, nose, mouth, throat, and face: ( - ) mucositis, ( - ) sore throat Respiratory: ( - ) cough, ( - ) dyspnea, ( - ) wheezes Cardiovascular: ( - ) palpitation, ( - ) chest discomfort, ( - ) lower extremity swelling Gastrointestinal:  ( - ) nausea, ( - ) heartburn, ( - ) change in bowel habits Skin: ( - ) abnormal skin rashes Lymphatics: ( - ) new lymphadenopathy, ( - ) easy bruising Neurological: ( - ) numbness, ( - ) tingling, ( - ) new weaknesses Behavioral/Psych: ( - ) mood change, ( - ) new changes  All other systems were reviewed with the patient and are negative.  PHYSICAL EXAMINATION: ECOG PERFORMANCE STATUS: 2 - Symptomatic, <50% confined to bed  Vitals:   02/29/24 1353  BP: 132/75  Pulse: 81  Resp: 19  Temp: 98.3 F (36.8 C)  SpO2: 100%      Filed Weights   02/29/24 1353  Weight: 153 lb 3.2 oz (69.5 kg)     Physical Exam Constitutional:      General: He is not in acute distress.    Appearance: Normal appearance.     Comments: Came with a walker   HENT:     Head: Normocephalic and atraumatic.  Cardiovascular:     Rate and Rhythm: Normal rate and regular rhythm.     Pulses: Normal pulses.     Heart sounds: Normal  heart sounds.  Pulmonary:     Effort: Pulmonary effort is normal.     Breath sounds: Normal breath sounds. No wheezing.  Musculoskeletal:        General: No swelling or tenderness.  Neurological:     General: No focal deficit present.     Mental Status: He is alert.  Psychiatric:        Mood and Affect: Mood normal.      LABORATORY DATA:  I have reviewed the data as listed    Latest Ref Rng & Units 02/29/2024    1:17 PM 11/29/2023   10:08 AM 10/12/2023   11:20 AM  CBC  WBC 4.0 - 10.5 K/uL 9.1  13.5  7.7   Hemoglobin 13.0 - 17.0 g/dL 7.0  8.8  8.1   Hematocrit 39.0 - 52.0 % 23.3  27.9  26.0   Platelets 150 - 400 K/uL 291  371  273        Latest Ref Rng & Units 11/29/2023   10:08 AM 10/12/2023   11:20 AM 10/11/2023    6:02 PM  CMP  Glucose 70 - 99 mg/dL 409  811  914   BUN 8 - 23 mg/dL 23  19  20    Creatinine 0.61 - 1.24 mg/dL 7.82  9.56  2.13   Sodium 135 - 145 mmol/L 137  134  136   Potassium 3.5 - 5.1 mmol/L 4.0  3.3  3.2   Chloride 98 - 111 mmol/L 105  102  105   CO2 22 - 32 mmol/L 24  23  20    Calcium 8.9 - 10.3 mg/dL 8.7  8.5  8.5   Total Protein 6.5 - 8.1 g/dL 7.2  6.9  6.9   Total Bilirubin 0.0 - 1.2 mg/dL 0.2  <0.8  0.4   Alkaline Phos 38 - 126 U/L 171  119  125   AST 15 - 41 U/L 11  17  27    ALT 0 - 44 U/L 6  10  14     RADIOGRAPHIC STUDIES: No results found.   ASSESSMENT & PLAN Joseph Rangel 78 y.o. male with medical history significant for metastatic prostate cancer who presents for a follow up visit.  PMH significant for prostate adenocarcinoma Gleason 3+4, no LN involvement s.p robotic prostatectomy in 2013, presented with peri aortic left iliac and peri rectal LN, with biopsy confirming metastatic prostate adenoca now on zytiga  and lupron  presents here for FU on his metastatic prostate cancer. Baseline PSA of 207. Assessment and Plan Assessment & Plan # Metastatic Castrate Sensitive Prostate Cancer -- continue on Zytiga  1000mg  PO daily with  prednisone  -- Lupron  every 3 months, dose planned today,  -- PSA from today is pending, last PSA without any concerns for recurrence. ---He should continue on Zytiga  with prednisone  and lupron , follow up every 12 weeks. -- He did not have any bone metastasis hence he is not on bisphosphonate therapy.    Anemia Hemoglobin at 7, likely due to gastrointestinal bleeding. No recent iron  supplementation. Near transfusion threshold. - Arrange blood transfusion. - Draw blood for cross-matching. - Refer to gastroenterology for GI bleeding evaluation.  Urinary incontinence Incontinence noted. Using diapers for leakage.  Knee problems Condition improving, walking unaided.  Follow-up - Schedule Lupron  injection in three months. - Arrange follow-up in three months for prostate cancer. - Ensure gastroenterology referral completion.  No orders of the defined types were placed in this encounter.   All questions were answered. The patient knows to call the clinic with any problems, questions or concerns.  A total of more than 30 minutes were spent on this encounter with face-to-face time and non-face-to-face time, including preparing to see the patient, ordering tests and/or medications, counseling the patient and coordination of care as outlined above.   Murleen Arms MD   02/29/2024 1:58 PM

## 2024-03-01 ENCOUNTER — Ambulatory Visit: Payer: Self-pay | Admitting: Hematology and Oncology

## 2024-03-01 LAB — PSA, TOTAL AND FREE
PSA, Free Pct: UNDETERMINED %
PSA, Free Pct: UNDETERMINED %
PSA, Free: <0.02 ng/mL
PSA, Free: <0.02 ng/mL
Prostate Specific Ag, Serum: <0.1 ng/mL (ref 0.0–4.0)
Prostate Specific Ag, Serum: <0.1 ng/mL (ref 0.0–4.0)

## 2024-03-02 ENCOUNTER — Other Ambulatory Visit: Payer: Self-pay

## 2024-03-02 ENCOUNTER — Inpatient Hospital Stay

## 2024-03-02 DIAGNOSIS — D649 Anemia, unspecified: Secondary | ICD-10-CM

## 2024-03-02 DIAGNOSIS — C61 Malignant neoplasm of prostate: Secondary | ICD-10-CM | POA: Diagnosis not present

## 2024-03-02 DIAGNOSIS — D509 Iron deficiency anemia, unspecified: Secondary | ICD-10-CM

## 2024-03-02 LAB — TYPE AND SCREEN
ABO/RH(D): O POS
Antibody Screen: NEGATIVE
Unit division: 0
Unit division: 0

## 2024-03-02 LAB — BPAM RBC
Blood Product Expiration Date: 202507102359
Blood Product Expiration Date: 202507112359
Blood Product Expiration Date: 202507102359
Unit Type and Rh: 5100
Unit Type and Rh: 5100
Unit Type and Rh: 5100

## 2024-03-02 LAB — SAMPLE TO BLOOD BANK

## 2024-03-02 LAB — PREPARE RBC (CROSSMATCH)

## 2024-03-02 MED ORDER — ACETAMINOPHEN 325 MG PO TABS
650.0000 mg | ORAL_TABLET | Freq: Once | ORAL | Status: AC
  Administered 2024-03-02: 650 mg via ORAL
  Filled 2024-03-02: qty 2

## 2024-03-02 MED ORDER — SODIUM CHLORIDE 0.9% IV SOLUTION
250.0000 mL | INTRAVENOUS | Status: DC
  Administered 2024-03-02: 100 mL via INTRAVENOUS

## 2024-03-02 MED ORDER — DIPHENHYDRAMINE HCL 25 MG PO CAPS
25.0000 mg | ORAL_CAPSULE | Freq: Once | ORAL | Status: AC
  Administered 2024-03-02: 25 mg via ORAL
  Filled 2024-03-02: qty 1

## 2024-03-02 NOTE — Progress Notes (Signed)
 Pt here today for 1 unit PRBC. Per MD, pt needs to come in 03/06/24 for labs to r/c hgb and assess if PRBC xfusion is needed again. Orders placed per MD verbal order pending lab results. Prepare order will need to be released if labs indicate need for xfusion. Message sent to scheduler to schedule this.

## 2024-03-02 NOTE — Progress Notes (Signed)
 Pt. arrived to infusion for blood transfusion and does not have blood bracelet on, states no one told him to keep it on and he threw it away. Lab called for re stick, pt. unable to receive blood today. Instructed pt. to keep on new blood bracelet and also spoke with his daughter too and instructed her to keep it on as well. Pt. states he understands and is rescheduled for 03/04/24.

## 2024-03-04 ENCOUNTER — Inpatient Hospital Stay

## 2024-03-04 DIAGNOSIS — D649 Anemia, unspecified: Secondary | ICD-10-CM

## 2024-03-04 DIAGNOSIS — C61 Malignant neoplasm of prostate: Secondary | ICD-10-CM

## 2024-03-04 DIAGNOSIS — D509 Iron deficiency anemia, unspecified: Secondary | ICD-10-CM

## 2024-03-04 MED ORDER — SODIUM CHLORIDE 0.9% IV SOLUTION
250.0000 mL | INTRAVENOUS | Status: DC
  Administered 2024-03-04: 100 mL via INTRAVENOUS

## 2024-03-06 ENCOUNTER — Encounter (HOSPITAL_COMMUNITY): Payer: Self-pay | Admitting: Emergency Medicine

## 2024-03-06 ENCOUNTER — Other Ambulatory Visit: Payer: Self-pay | Admitting: Hematology and Oncology

## 2024-03-06 ENCOUNTER — Other Ambulatory Visit: Payer: Self-pay

## 2024-03-06 ENCOUNTER — Emergency Department (HOSPITAL_COMMUNITY)
Admission: EM | Admit: 2024-03-06 | Discharge: 2024-03-06 | Disposition: A | Discharge status: Home / Self Care | Source: Home / Self Care | Attending: Emergency Medicine | Admitting: Emergency Medicine

## 2024-03-06 ENCOUNTER — Emergency Department (HOSPITAL_COMMUNITY)

## 2024-03-06 DIAGNOSIS — D649 Anemia, unspecified: Secondary | ICD-10-CM | POA: Insufficient documentation

## 2024-03-06 DIAGNOSIS — I10 Essential (primary) hypertension: Secondary | ICD-10-CM | POA: Insufficient documentation

## 2024-03-06 DIAGNOSIS — R7309 Other abnormal glucose: Secondary | ICD-10-CM | POA: Insufficient documentation

## 2024-03-06 DIAGNOSIS — C61 Malignant neoplasm of prostate: Secondary | ICD-10-CM

## 2024-03-06 DIAGNOSIS — E876 Hypokalemia: Secondary | ICD-10-CM | POA: Diagnosis not present

## 2024-03-06 DIAGNOSIS — Z79899 Other long term (current) drug therapy: Secondary | ICD-10-CM | POA: Insufficient documentation

## 2024-03-06 DIAGNOSIS — R7881 Bacteremia: Secondary | ICD-10-CM | POA: Diagnosis not present

## 2024-03-06 DIAGNOSIS — Z8546 Personal history of malignant neoplasm of prostate: Secondary | ICD-10-CM | POA: Insufficient documentation

## 2024-03-06 DIAGNOSIS — R739 Hyperglycemia, unspecified: Secondary | ICD-10-CM

## 2024-03-06 DIAGNOSIS — D509 Iron deficiency anemia, unspecified: Secondary | ICD-10-CM | POA: Insufficient documentation

## 2024-03-06 DIAGNOSIS — C772 Secondary and unspecified malignant neoplasm of intra-abdominal lymph nodes: Secondary | ICD-10-CM

## 2024-03-06 DIAGNOSIS — R531 Weakness: Secondary | ICD-10-CM

## 2024-03-06 DIAGNOSIS — N289 Disorder of kidney and ureter, unspecified: Secondary | ICD-10-CM | POA: Insufficient documentation

## 2024-03-06 LAB — URINALYSIS, W/ REFLEX TO CULTURE (INFECTION SUSPECTED)
Bacteria, UA: NONE SEEN
Bilirubin Urine: NEGATIVE
Glucose, UA: NEGATIVE mg/dL
Hgb urine dipstick: NEGATIVE
Ketones, ur: NEGATIVE mg/dL
Leukocytes,Ua: NEGATIVE
Nitrite: NEGATIVE
Protein, ur: NEGATIVE mg/dL
Specific Gravity, Urine: 1.016 (ref 1.005–1.030)
pH: 5 (ref 5.0–8.0)

## 2024-03-06 LAB — CBC WITH DIFFERENTIAL/PLATELET
Abs Immature Granulocytes: 0.15 10*3/uL — ABNORMAL HIGH (ref 0.00–0.07)
Basophils Absolute: 0 10*3/uL (ref 0.0–0.1)
Basophils Relative: 0 %
Eosinophils Absolute: 0 10*3/uL (ref 0.0–0.5)
Eosinophils Relative: 0 %
HCT: 27.6 % — ABNORMAL LOW (ref 39.0–52.0)
Hemoglobin: 8.6 g/dL — ABNORMAL LOW (ref 13.0–17.0)
Immature Granulocytes: 1 %
Lymphocytes Relative: 7 %
Lymphs Abs: 1 10*3/uL (ref 0.7–4.0)
MCH: 25.9 pg — ABNORMAL LOW (ref 26.0–34.0)
MCHC: 31.2 g/dL (ref 30.0–36.0)
MCV: 83.1 fL (ref 80.0–100.0)
Monocytes Absolute: 1.2 10*3/uL — ABNORMAL HIGH (ref 0.1–1.0)
Monocytes Relative: 8 %
Neutro Abs: 13.2 10*3/uL — ABNORMAL HIGH (ref 1.7–7.7)
Neutrophils Relative %: 84 %
Platelets: 247 10*3/uL (ref 150–400)
RBC: 3.32 MIL/uL — ABNORMAL LOW (ref 4.22–5.81)
RDW: 15.7 % — ABNORMAL HIGH (ref 11.5–15.5)
WBC: 15.7 10*3/uL — ABNORMAL HIGH (ref 4.0–10.5)
nRBC: 0 % (ref 0.0–0.2)

## 2024-03-06 LAB — COMPREHENSIVE METABOLIC PANEL WITH GFR
ALT: 8 U/L (ref 0–44)
AST: 14 U/L — ABNORMAL LOW (ref 15–41)
Albumin: 3.3 g/dL — ABNORMAL LOW (ref 3.5–5.0)
Alkaline Phosphatase: 114 U/L (ref 38–126)
Anion gap: 11 (ref 5–15)
BUN: 21 mg/dL (ref 8–23)
CO2: 22 mmol/L (ref 22–32)
Calcium: 8.4 mg/dL — ABNORMAL LOW (ref 8.9–10.3)
Chloride: 102 mmol/L (ref 98–111)
Creatinine, Ser: 1.3 mg/dL — ABNORMAL HIGH (ref 0.61–1.24)
GFR, Estimated: 56 mL/min — ABNORMAL LOW (ref >60)
Glucose, Bld: 108 mg/dL — ABNORMAL HIGH (ref 70–99)
Potassium: 2.7 mmol/L — CL (ref 3.5–5.1)
Sodium: 135 mmol/L (ref 135–145)
Total Bilirubin: 0.8 mg/dL (ref 0.0–1.2)
Total Protein: 7.3 g/dL (ref 6.5–8.1)

## 2024-03-06 LAB — TYPE AND SCREEN
ABO/RH(D): O POS
Antibody Screen: NEGATIVE
Unit division: 0

## 2024-03-06 LAB — PROTIME-INR
INR: 1.2 (ref 0.8–1.2)
Prothrombin Time: 15.1 s (ref 11.4–15.2)

## 2024-03-06 LAB — BPAM RBC
Blood Product Expiration Date: 202507102359
ISSUE DATE / TIME: 202506141139

## 2024-03-06 LAB — I-STAT CG4 LACTIC ACID, ED: Lactic Acid, Venous: 0.9 mmol/L (ref 0.5–1.9)

## 2024-03-06 LAB — CBG MONITORING, ED: Glucose-Capillary: 110 mg/dL — ABNORMAL HIGH (ref 70–99)

## 2024-03-06 LAB — MAGNESIUM: Magnesium: 2.1 mg/dL (ref 1.7–2.4)

## 2024-03-06 MED ORDER — POTASSIUM CHLORIDE 10 MEQ/100ML IV SOLN
10.0000 meq | INTRAVENOUS | Status: AC
  Administered 2024-03-06 (×2): 10 meq via INTRAVENOUS
  Filled 2024-03-06: qty 100

## 2024-03-06 MED ORDER — POTASSIUM CHLORIDE CRYS ER 20 MEQ PO TBCR
40.0000 meq | EXTENDED_RELEASE_TABLET | Freq: Once | ORAL | Status: AC
  Administered 2024-03-06: 40 meq via ORAL
  Filled 2024-03-06: qty 2

## 2024-03-06 MED ORDER — POTASSIUM CHLORIDE CRYS ER 20 MEQ PO TBCR: 20.0000 meq | EXTENDED_RELEASE_TABLET | Freq: Every day | ORAL | 0 refills | Status: DC

## 2024-03-06 NOTE — ED Notes (Signed)
 03/06/24 1857 Unable to reach pt to have him return as Dr Gordon Latus requested. Left message on mobile phone to return call or come to the ED.

## 2024-03-06 NOTE — ED Triage Notes (Signed)
 Pt is prostate CA pt here. Transfusion yesterday. Normally able to walk with walker at home. Now having weakness and lethargy.

## 2024-03-06 NOTE — ED Provider Notes (Signed)
 [place] EMERGENCY DEPARTMENT AT [facility] Provider Note   CSN: [id number] Arrival date & time: 03/06/24  7829     Patient presents with: Fever   Joseph Rangel is a 78 y.o. male.   The history is provided by the patient.  Fever He has history of hypertension, seizures, prostate cancer and comes in today because he was feeling weak.  He is usually able to ambulate with a walker but was unable to ambulate.  Even with felt weak sitting up.  He has had a nonproductive cough.  He denies fever, chills, sweats.  He denies nausea, vomiting, diarrhea.  He denies any dysuria.  Of note, he received a blood transfusion yesterday, states he did not feel any better following the blood transfusion.   Prior to Admission medications   Medication Sig Start Date End Date Taking? Authorizing Provider  abiraterone  acetate (ZYTIGA ) 250 MG tablet TAKE 4 TABLETS BY MOUTH DAILY ON AN EMPTY STOMACH, 1 HR BEFORE OR 2 HRS AFTER A MEAL 11/22/23   Iruku, Praveena, MD  acetaminophen  (TYLENOL ) 325 MG tablet Take 2 tablets (650 mg total) by mouth every 6 (six) hours as needed for mild pain (or Fever >/= 101). 06/22/23   Rai, Hurman Maiden, MD  atenolol  (TENORMIN ) 25 MG tablet Take 25 mg by mouth daily.    [provider]  buprenorphine  (BUTRANS ) 20 MCG/HR PTWK Place 1 patch onto the skin once a week. 10/15/23   Haydee Lipa, MD  cyanocobalamin  1000 MCG tablet Take 1 tablet (1,000 mcg total) by mouth daily. 06/23/23   Rai, Hurman Maiden, MD  hydrochlorothiazide  (HYDRODIURIL ) 25 MG tablet Take 25 mg by mouth daily. 07/31/23   [provider]  HYDROcodone -acetaminophen  (NORCO) 10-325 MG tablet Take 1 tablet by mouth 4 (four) times daily as needed for severe pain (pain score 7-10). 10/15/23   Haydee Lipa, MD  olmesartan  (BENICAR ) 40 MG tablet Take 40 mg by mouth daily. 06/26/20   [provider]  omeprazole (PRILOSEC) 40 MG capsule Take 40 mg by mouth daily.    [provider]  phenytoin  (DILANTIN ) 100 MG ER capsule Take 300 mg by mouth at bedtime.    [provider]  potassium chloride  SA (KLOR-CON  M) 20 MEQ tablet Take 1 tablet (20 mEq total) by mouth daily. Can substitute to the packet 06/22/23   Rai, Hurman Maiden, MD  predniSONE  (DELTASONE ) 5 MG tablet Take 1 tablet (5 mg total) by mouth daily with breakfast. 02/29/24   Iruku, Praveena, MD  senna-docusate (SENOKOT-S) 8.6-50 MG tablet Take 1 tablet by mouth at bedtime as needed for mild constipation. 06/22/23   Rai, Hurman Maiden, MD  verapamil  (VERELAN  PM) 360 MG 24 hr capsule Take 360 mg by mouth daily. 04/01/20   [provider]    Allergies: Patient has no known allergies.    Review of Systems  Constitutional:  Positive for fever.  All other systems reviewed and are negative.   Updated Vital Signs BP 122/70   Pulse 89   Temp 98.8 F (37.1 C)   Resp (!) 23   SpO2 96%   Physical Exam Vitals and nursing note reviewed.   78 year old male, resting comfortably and in no acute distress. Vital signs are significant for borderline elevated temperature. Oxygen saturation is 97%, which is normal. Head is normocephalic and atraumatic. PERRLA, EOMI. Oropharynx is clear. Neck is nontender and supple without adenopathy or JVD. Back is nontender and there is no CVA  tenderness. Lungs have coarse breath sounds without overt rales, wheezes, or rhonchi. Chest is nontender. Heart has regular rate and rhythm without murmur. Abdomen is soft, flat, nontender. Genitalia: Uncircumcised penis. Extremities have no cyanosis or edema, full range of motion is present. Skin is warm and dry without rash. Neurologic: Awake and alert, moves all extremities equally.  (all labs ordered are listed, but only abnormal results are displayed) Labs Reviewed  COMPREHENSIVE METABOLIC PANEL WITH GFR - Abnormal; Notable for the following components:      Result Value   Potassium 2.7 (*)    Glucose, Bld 108 (*)     Creatinine, Ser 1.30 (*)    Calcium 8.4 (*)    Albumin 3.3 (*)    AST 14 (*)    GFR, Estimated 56 (*)    All other components within normal limits  CBC WITH DIFFERENTIAL/PLATELET - Abnormal; Notable for the following components:   WBC 15.7 (*)    RBC 3.32 (*)    Hemoglobin 8.6 (*)    HCT 27.6 (*)    MCH 25.9 (*)    RDW 15.7 (*)    Neutro Abs 13.2 (*)    Monocytes Absolute 1.2 (*)    Abs Immature Granulocytes 0.15 (*)    All other components within normal limits  CBG MONITORING, ED - Abnormal; Notable for the following components:   Glucose-Capillary 110 (*)    All other components within normal limits  CULTURE, BLOOD (ROUTINE X 2)  CULTURE, BLOOD (ROUTINE X 2)  PROTIME-INR  URINALYSIS, W/ REFLEX TO CULTURE (INFECTION SUSPECTED)  MAGNESIUM   I-STAT CG4 LACTIC ACID, ED    EKG: EKG Interpretation Date/Time:  Monday March 06 2024 00:55:28 EDT Ventricular Rate:  99 PR Interval:  191 QRS Duration:  97 QT Interval:  395 QTC Calculation: 507 R Axis:   -33  Text Interpretation: Sinus rhythm Left axis deviation Borderline T abnormalities, anterior leads Prolonged QT interval When compared with ECG of 10/11/2023, No significant change was found Confirmed by Alissa April ([id number]) on 03/06/2024 1:01:44 AM  Radiology: Lenell Query Chest Port 1 View Result Date: 03/06/2024 EXAM: 1 VIEW XRAY OF THE CHEST 03/06/2024 01:41:35 AM COMPARISON: 10/11/2023 CLINICAL HISTORY: Questionable sepsis - evaluate for abnormality. Pt is prostate CA pt here. Transfusion yesterday. Normally able to walk with walker at home. Now having weakness and lethargy. FINDINGS: LUNGS AND PLEURA: No focal pulmonary opacity. No pulmonary edema. No pleural effusion. No pneumothorax. HEART AND MEDIASTINUM: No acute abnormality of the cardiac and mediastinal silhouettes. BONES AND SOFT TISSUES: No acute osseous abnormality. IMPRESSION: 1. No acute process. Electronically signed by: Zadie Herter MD 03/06/2024 01:49 AM EDT RP  Workstation: [id number]     Procedures   Medications Ordered in the ED  potassium chloride  SA (KLOR-CON  M) CR tablet 40 mEq (40 mEq Oral Given 03/06/24 0214)  potassium chloride  10 mEq in 100 mL IVPB (0 mEq Intravenous Stopped 03/06/24 0417)                                   Medical Decision Making Amount and/or Complexity of Data Reviewed Labs: ordered. Radiology: ordered.  Risk Prescription drug management.   Weakness with probable fever.  I have initiated the evolving sepsis pathway.  I have reviewed his past records and note packed red blood cell transfusion yesterday because of hemoglobin 7.0 with concern for GI bleeding.  Office note on 02/29/2024 states low hemoglobin with  black stools, prostate cancer being treated with abiraterone , prednisone  and leuprolide  injection every 3 months.  I have reviewed his electrocardiogram, and my interpretation is sinus rhythm with left axis deviation and borderline T wave changes unchanged from prior.  Chest x-ray shows no acute process.  I independently viewed the image, and agree with the radiologist's interpretation.  I have reviewed his laboratory tests, and my interpretation is urinalysis showing no sign of infection, normal lactic acid level, mildly elevated creatinine not significantly changed from baseline, elevated random glucose level, moderate to severe hypokalemia.  Mild leukocytosis which is nonspecific, anemia which has improved following blood transfusion.  I have ordered both oral and intravenous potassium and ordered magnesium  level which is normal.  There is no sign of infection or sepsis.  I wonder if his hypokalemia is the reason he is feeling so weak.  I ordered a trial of ambulation following potassium repletion, and he was able to ambulate with his walker which is his baseline.  I am discharging him with prescription for oral potassium.  I note that he is on hydrochlorothiazide  and suspect he may need ongoing potassium  supplementation.  I am referring him back to his primary care provider regarding whether he will need ongoing potassium supplementation, and referring him back to his oncologist to monitor his anemia.  CRITICAL CARE Performed by: Alissa April Total critical care time: 45 minutes Critical care time was exclusive of separately billable procedures and treating other patients. Critical care was necessary to treat or prevent imminent or life-threatening deterioration. Critical care was time spent personally by me on the following activities: development of treatment plan with patient and/or surrogate as well as nursing, discussions with consultants, evaluation of patient's response to treatment, examination of patient, obtaining history from patient or surrogate, ordering and performing treatments and interventions, ordering and review of laboratory studies, ordering and review of radiographic studies, pulse oximetry and re-evaluation of patient's condition.  Final diagnoses:  Weakness  Hypokalemia  Renal insufficiency  Elevated random blood glucose level  Normocytic anemia    ED Discharge Orders          Ordered    potassium chloride  SA (KLOR-CON  M) 20 MEQ tablet  Daily        03/06/24 0513               Alissa April, MD 03/06/24 [phone]

## 2024-03-06 NOTE — Discharge Instructions (Addendum)
 Your potassium was very low today.  That may be because of one of your blood pressure medications.  I am giving you a prescription for potassium to take at home.  Please discuss with your primary care provider whether you should continue to take a potassium supplement once my prescription is finished.  Please continue to follow-up with the cancer center regarding your anemia.  Return to the emergency department if you have any new or concerning symptoms.

## 2024-03-06 NOTE — ED Notes (Signed)
 Spoke to Wife and states daughter will P/U Pt @ 1100

## 2024-03-06 NOTE — ED Notes (Signed)
 Pt walked with walker to hallway outside his door. Said he felt better.

## 2024-03-06 NOTE — ED Notes (Signed)
 Spoke to wife again and explained daughter did not show up, and states she will call other daughter.

## 2024-03-06 NOTE — ED Notes (Signed)
 Pt encouraged to urinate

## 2024-03-06 NOTE — ED Notes (Signed)
 Night shift reported unable to reach family for DC P/U. Made attepmt to call and unsuccessful as well.

## 2024-03-06 NOTE — ED Notes (Signed)
 Attempted to call family (wife and 3 dtrs)  to come pick him up. No answer.

## 2024-03-06 NOTE — ED Notes (Signed)
 Micro called, pt has gram positive cultures in 2 out of 4 bottles, results given to Trifan

## 2024-03-07 ENCOUNTER — Telehealth: Payer: Self-pay

## 2024-03-07 NOTE — Telephone Encounter (Signed)
 Dr. Arno Bibles, patient will be scheduled as soon as possible.  Auth Submission: NO AUTH NEEDED Site of care: Site of care: [facility] Payer: [employer] medicare Medication & CPT/J Code(s) submitted: Venofer  (Iron  Sucrose) J1756 Diagnosis Code:  Route of submission (phone, fax, portal):  Phone # Fax # Auth type: Buy/Bill PB Units/visits requested: 200mg  x 5 doses Reference number:  Approval from: 03/07/24 to 07/07/24

## 2024-03-08 ENCOUNTER — Inpatient Hospital Stay (HOSPITAL_COMMUNITY)
Admission: EM | Admit: 2024-03-08 | Discharge: 2024-03-14 | DRG: 871 | Disposition: A | Discharge status: Home Health | Attending: Internal Medicine | Admitting: Internal Medicine

## 2024-03-08 ENCOUNTER — Other Ambulatory Visit: Payer: Self-pay

## 2024-03-08 ENCOUNTER — Encounter (HOSPITAL_COMMUNITY): Payer: Self-pay | Admitting: Emergency Medicine

## 2024-03-08 DIAGNOSIS — J9601 Acute respiratory failure with hypoxia: Secondary | ICD-10-CM | POA: Diagnosis not present

## 2024-03-08 DIAGNOSIS — E876 Hypokalemia: Secondary | ICD-10-CM | POA: Diagnosis present

## 2024-03-08 DIAGNOSIS — I10 Essential (primary) hypertension: Secondary | ICD-10-CM | POA: Diagnosis present

## 2024-03-08 DIAGNOSIS — Z7952 Long term (current) use of systemic steroids: Secondary | ICD-10-CM

## 2024-03-08 DIAGNOSIS — R7881 Bacteremia: Secondary | ICD-10-CM | POA: Diagnosis not present

## 2024-03-08 DIAGNOSIS — D509 Iron deficiency anemia, unspecified: Secondary | ICD-10-CM | POA: Diagnosis present

## 2024-03-08 DIAGNOSIS — J189 Pneumonia, unspecified organism: Principal | ICD-10-CM | POA: Diagnosis present

## 2024-03-08 DIAGNOSIS — D849 Immunodeficiency, unspecified: Secondary | ICD-10-CM | POA: Diagnosis present

## 2024-03-08 DIAGNOSIS — Z79818 Long term (current) use of other agents affecting estrogen receptors and estrogen levels: Secondary | ICD-10-CM

## 2024-03-08 DIAGNOSIS — F17210 Nicotine dependence, cigarettes, uncomplicated: Secondary | ICD-10-CM | POA: Diagnosis present

## 2024-03-08 DIAGNOSIS — C61 Malignant neoplasm of prostate: Secondary | ICD-10-CM | POA: Diagnosis present

## 2024-03-08 DIAGNOSIS — G40909 Epilepsy, unspecified, not intractable, without status epilepticus: Secondary | ICD-10-CM | POA: Diagnosis present

## 2024-03-08 DIAGNOSIS — N289 Disorder of kidney and ureter, unspecified: Secondary | ICD-10-CM | POA: Diagnosis present

## 2024-03-08 DIAGNOSIS — K219 Gastro-esophageal reflux disease without esophagitis: Secondary | ICD-10-CM | POA: Diagnosis present

## 2024-03-08 DIAGNOSIS — B9689 Other specified bacterial agents as the cause of diseases classified elsewhere: Secondary | ICD-10-CM | POA: Diagnosis present

## 2024-03-08 DIAGNOSIS — Z79899 Other long term (current) drug therapy: Secondary | ICD-10-CM

## 2024-03-08 DIAGNOSIS — C799 Secondary malignant neoplasm of unspecified site: Secondary | ICD-10-CM | POA: Diagnosis present

## 2024-03-08 DIAGNOSIS — E785 Hyperlipidemia, unspecified: Secondary | ICD-10-CM | POA: Diagnosis present

## 2024-03-08 LAB — CBC WITH DIFFERENTIAL/PLATELET
Abs Immature Granulocytes: 0.08 10*3/uL — ABNORMAL HIGH (ref 0.00–0.07)
Basophils Absolute: 0 10*3/uL (ref 0.0–0.1)
Basophils Relative: 0 %
Eosinophils Absolute: 0.3 10*3/uL (ref 0.0–0.5)
Eosinophils Relative: 2 %
HCT: 29.8 % — ABNORMAL LOW (ref 39.0–52.0)
Hemoglobin: 9.1 g/dL — ABNORMAL LOW (ref 13.0–17.0)
Immature Granulocytes: 1 %
Lymphocytes Relative: 18 %
Lymphs Abs: 2.3 10*3/uL (ref 0.7–4.0)
MCH: 26.1 pg (ref 26.0–34.0)
MCHC: 30.5 g/dL (ref 30.0–36.0)
MCV: 85.4 fL (ref 80.0–100.0)
Monocytes Absolute: 1 10*3/uL (ref 0.1–1.0)
Monocytes Relative: 8 %
Neutro Abs: 9.1 10*3/uL — ABNORMAL HIGH (ref 1.7–7.7)
Neutrophils Relative %: 71 %
Platelets: 299 10*3/uL (ref 150–400)
RBC: 3.49 MIL/uL — ABNORMAL LOW (ref 4.22–5.81)
RDW: 15.8 % — ABNORMAL HIGH (ref 11.5–15.5)
WBC: 12.7 10*3/uL — ABNORMAL HIGH (ref 4.0–10.5)
nRBC: 0 % (ref 0.0–0.2)

## 2024-03-08 LAB — COMPREHENSIVE METABOLIC PANEL WITH GFR
ALT: 8 U/L (ref 0–44)
AST: 13 U/L — ABNORMAL LOW (ref 15–41)
Albumin: 3.4 g/dL — ABNORMAL LOW (ref 3.5–5.0)
Alkaline Phosphatase: 113 U/L (ref 38–126)
Anion gap: 8 (ref 5–15)
BUN: 15 mg/dL (ref 8–23)
CO2: 25 mmol/L (ref 22–32)
Calcium: 8.4 mg/dL — ABNORMAL LOW (ref 8.9–10.3)
Chloride: 101 mmol/L (ref 98–111)
Creatinine, Ser: 1.05 mg/dL (ref 0.61–1.24)
GFR, Estimated: >60 mL/min (ref >60)
Glucose, Bld: 114 mg/dL — ABNORMAL HIGH (ref 70–99)
Potassium: 3.2 mmol/L — ABNORMAL LOW (ref 3.5–5.1)
Sodium: 134 mmol/L — ABNORMAL LOW (ref 135–145)
Total Bilirubin: 0.5 mg/dL (ref 0.0–1.2)
Total Protein: 7.2 g/dL (ref 6.5–8.1)

## 2024-03-08 LAB — URINALYSIS, W/ REFLEX TO CULTURE (INFECTION SUSPECTED)
Bilirubin Urine: NEGATIVE
Glucose, UA: NEGATIVE mg/dL
Ketones, ur: NEGATIVE mg/dL
Nitrite: NEGATIVE
Protein, ur: 30 mg/dL — AB
Specific Gravity, Urine: 1.023 (ref 1.005–1.030)
pH: 5 (ref 5.0–8.0)

## 2024-03-08 LAB — I-STAT CG4 LACTIC ACID, ED
Lactic Acid, Venous: 1.6 mmol/L (ref 0.5–1.9)
Lactic Acid, Venous: 1 mmol/L (ref 0.5–1.9)

## 2024-03-08 LAB — CULTURE, BLOOD (ROUTINE X 2): Special Requests: ADEQUATE

## 2024-03-08 MED ORDER — ACETAMINOPHEN 325 MG PO TABS: 650.0000 mg | ORAL_TABLET | Freq: Four times a day (QID) | ORAL | Status: DC | PRN

## 2024-03-08 MED ORDER — DM-GUAIFENESIN ER 30-600 MG PO TB12
1.0000 | ORAL_TABLET | Freq: Two times a day (BID) | ORAL | Status: DC
  Administered 2024-03-08: 1 via ORAL
  Filled 2024-03-08: qty 1

## 2024-03-08 MED ORDER — VANCOMYCIN HCL IN DEXTROSE 1-5 GM/200ML-% IV SOLN: 1000.0000 mg | Freq: Once | INTRAVENOUS | Status: DC

## 2024-03-08 MED ORDER — ONDANSETRON HCL 4 MG/2ML IJ SOLN: 4.0000 mg | Freq: Four times a day (QID) | INTRAMUSCULAR | Status: DC | PRN

## 2024-03-08 MED ORDER — ENOXAPARIN SODIUM 40 MG/0.4ML IJ SOSY
40.0000 mg | PREFILLED_SYRINGE | INTRAMUSCULAR | Status: DC
  Administered 2024-03-08 – 2024-03-13 (×5): 40 mg via SUBCUTANEOUS
  Filled 2024-03-08 (×6): qty 0.4

## 2024-03-08 MED ORDER — SODIUM CHLORIDE 0.9 % IV BOLUS
1000.0000 mL | Freq: Once | INTRAVENOUS | Status: AC
  Administered 2024-03-08: 1000 mL via INTRAVENOUS

## 2024-03-08 MED ORDER — POTASSIUM CHLORIDE CRYS ER 20 MEQ PO TBCR
40.0000 meq | EXTENDED_RELEASE_TABLET | Freq: Once | ORAL | Status: AC
  Administered 2024-03-08: 40 meq via ORAL
  Filled 2024-03-08: qty 2

## 2024-03-08 MED ORDER — ACETAMINOPHEN 650 MG RE SUPP: 650.0000 mg | Freq: Four times a day (QID) | RECTAL | Status: DC | PRN

## 2024-03-08 MED ORDER — VANCOMYCIN HCL 1500 MG/300ML IV SOLN
1500.0000 mg | Freq: Once | INTRAVENOUS | Status: AC
  Administered 2024-03-08: 1500 mg via INTRAVENOUS
  Filled 2024-03-08: qty 300

## 2024-03-08 MED ORDER — SENNOSIDES-DOCUSATE SODIUM 8.6-50 MG PO TABS
1.0000 | ORAL_TABLET | Freq: Every evening | ORAL | Status: DC | PRN
Start: 2024-03-08 — End: 2024-03-14

## 2024-03-08 MED ORDER — ONDANSETRON HCL 4 MG PO TABS: 4.0000 mg | ORAL_TABLET | Freq: Four times a day (QID) | ORAL | Status: DC | PRN

## 2024-03-08 NOTE — H&P (Addendum)
 History and Physical  Joseph Rangel FMW:[id number] DOB: [birth year] DOA: 03/08/2024  PCP: Leigh Lung, MD   Chief Complaint: Positive blood culture  HPI: Joseph Rangel is a 78 y.o. male with medical history significant for metastatic prostate cancer on Zytiga , prednisone  and Lupron , HTN, arthritis, anemia, HLD, GERD, seizure disorder, chronic hypokalemia who was advised to present to the ED due to gram-positive rods from blood culture.  Per spouse, patient had blood transfusion on Saturday and felt weak right afterwards.  He continued to feel weak so they presented to the ER on Sunday.  Workup showed worsening hypokalemia, leukocytosis and stable anemia. He received potassium supplementation and blood cultures were obtained.  Per spouse, patient has been doing well over the last 2 days. They received a call to bring patient back to the hospital due to positive blood cultures. Patient reports poor appetite for months and recent congested cough but overall doing well without any fevers, chills, shortness of breath, chest pain, nausea, vomiting or abdominal pain.  ED Course: Initial vitals show patient afebrile but hypertensive with SBP in the 130s to 150s. Initial labs significant for sodium 134, K+ 3.2, glucose 114, creatinine 1.05, WBC 12.7 (from 15.7 3 days ago), Hgb 9.1, lactic acid 1.6->1.0, UA with no signs of infection. Pt received IV vancomycin . TRH was consulted for admission.   Review of Systems: Please see HPI for pertinent positives and negatives. A complete 10 system review of systems are otherwise negative.  Past Medical History:  Diagnosis Date   Arthritis    Cancer [facility])    prostate    Frequency of urination    Hypertension    Seizures (HCC)    after brain surg - none in past 20 yrs   Speech disorder    due to brain surg   Past Surgical History:  Procedure Laterality Date   BRAIN SURGERY     BLOOD CLOT REMOVED 1980   MASS EXCISION  02/24/2012   Procedure: MINOR EXCISION OF  MASS;  Surgeon: Alm GORMAN Fragmin, MD;  Location: [facility];  Service: Urology;;  excision of neck lesion   ROBOT ASSISTED LAPAROSCOPIC RADICAL PROSTATECTOMY  02/24/2012   Procedure: ROBOTIC ASSISTED LAPAROSCOPIC RADICAL PROSTATECTOMY;  Surgeon: Alm GORMAN Fragmin, MD;  Location: [facility];  Service: Urology;  Laterality: N/A;          Social History:  reports that he has been smoking cigarettes. He has never used smokeless tobacco. He reports that he does not drink alcohol  and does not use drugs.  No Known Allergies  History reviewed. No pertinent family history.   Prior to Admission medications   Medication Sig Start Date End Date Taking? Authorizing Provider  abiraterone  acetate (ZYTIGA ) 250 MG tablet TAKE 4 TABLETS BY MOUTH DAILY ON AN EMPTY STOMACH, 1 HR BEFORE OR 2 HRS AFTER A MEAL 03/06/24   Iruku, Praveena, MD  acetaminophen  (TYLENOL ) 325 MG tablet Take 2 tablets (650 mg total) by mouth every 6 (six) hours as needed for mild pain (or Fever >/= 101). 06/22/23   Rai, Nydia POUR, MD  atenolol  (TENORMIN ) 25 MG tablet Take 25 mg by mouth daily.    [provider]  buprenorphine  (BUTRANS ) 20 MCG/HR PTWK Place 1 patch onto the skin once a week. 10/15/23   Lue Elsie BROCKS, MD  cyanocobalamin  1000 MCG tablet Take 1 tablet (1,000 mcg total) by mouth daily. 06/23/23   Rai, Nydia POUR, MD  hydrochlorothiazide  (HYDRODIURIL ) 25 MG tablet Take 25 mg by mouth daily. 07/31/23  [provider]  HYDROcodone -acetaminophen  (NORCO) 10-325 MG tablet Take 1 tablet by mouth 4 (four) times daily as needed for severe pain (pain score 7-10). 10/15/23   Lue Elsie BROCKS, MD  olmesartan  (BENICAR ) 40 MG tablet Take 40 mg by mouth daily. 06/26/20   [provider]  omeprazole (PRILOSEC) 40 MG capsule Take 40 mg by mouth daily.    [provider]  phenytoin  (DILANTIN ) 100 MG ER capsule Take 300 mg by mouth at bedtime.    [provider]  potassium chloride  SA (KLOR-CON  M) 20 MEQ  tablet Take 1 tablet (20 mEq total) by mouth daily. Can substitute to the packet 03/06/24   Raford Lenis, MD  predniSONE  (DELTASONE ) 5 MG tablet Take 1 tablet (5 mg total) by mouth daily with breakfast. 02/29/24   Iruku, Praveena, MD  senna-docusate (SENOKOT-S) 8.6-50 MG tablet Take 1 tablet by mouth at bedtime as needed for mild constipation. 06/22/23   Rai, Ripudeep K, MD  verapamil  (VERELAN  PM) 360 MG 24 hr capsule Take 360 mg by mouth daily. 04/01/20   [provider]    Physical Exam: BP (!) 154/86   Pulse 76   Temp 98.1 F (36.7 C)   Resp 18   SpO2 100%  General: Pleasant, chronically ill elderly man laying in bed. No acute distress. HEENT: [place]/AT. Anicteric sclera CV: RRR. No murmurs, rubs, or gallops. No LE edema Pulmonary: Lungs CTAB. Normal effort. No wheezing or rales. Abdominal: Soft, nontender, nondistended. Normal bowel sounds. Extremities: Palpable radial and DP pulses. Normal ROM. Skin: Warm and dry. No obvious rash or lesions. Neuro: A&Ox3. Moves all extremities. Normal sensation to light touch. No focal deficit. Psych: Normal mood and affect          Labs on Admission:  Basic Metabolic Panel: Recent Labs  Lab 03/06/24 0102 03/06/24 0230 03/08/24 1618  NA 135  --  134*  K 2.7*  --  3.2*  CL 102  --  101  CO2 22  --  25  GLUCOSE 108*  --  114*  BUN 21  --  15  CREATININE 1.30*  --  1.05  CALCIUM 8.4*  --  8.4*  MG  --  2.1  --    Liver Function Tests: Recent Labs  Lab 03/06/24 0102 03/08/24 1618  AST 14* 13*  ALT 8 8  ALKPHOS 114 113  BILITOT 0.8 0.5  PROT 7.3 7.2  ALBUMIN 3.3* 3.4*   No results for input(s): LIPASE, AMYLASE in the last 168 hours. No results for input(s): AMMONIA in the last 168 hours. CBC: Recent Labs  Lab 03/06/24 0102 03/08/24 1618  WBC 15.7* 12.7*  NEUTROABS 13.2* 9.1*  HGB 8.6* 9.1*  HCT 27.6* 29.8*  MCV 83.1 85.4  PLT 247 299   Cardiac Enzymes: No results for input(s): CKTOTAL, CKMB, CKMBINDEX,  TROPONINI in the last 168 hours. BNP (last 3 results) No results for input(s): BNP in the last 8760 hours.  ProBNP (last 3 results) No results for input(s): PROBNP in the last 8760 hours.  CBG: Recent Labs  Lab 03/06/24 0211  GLUCAP 110*    Radiological Exams on Admission: No results found. Assessment/Plan Joseph Rangel is a 78 y.o. male with medical history significant for metastatic prostate cancer on Zytiga , prednisone  and Lupron , HTN, arthritis, anemia, HLD, GERD, seizure disorder, chronic hypokalemia who was advised to present to the ED due to gram-positive rods from blood culture and admitted for bacteremia.  # Bacteremia - Immunocompromised patient  with acute onset of generalized weakness - Blood cultures on 6/16 shows gram-positive rods and growing bacillus species - Patient nontoxic-appearing, hemodynamically stable, afebrile, white count trending down - Continue IV vancomycin  - Follow-up repeat blood cultures - ID consult in the morning - Trend CBC, fever curve  # Hypokalemia - K+ 3.7 3 days ago, improved to 3.2 on admission - Give KCl 40 mEq x 1 - Continue home potassium supplementation - Follow-up morning BMP, mag and Phos  # Metastatic prostate cancer - Followed by oncology - Continue daily Zytiga , daily prednisone  and Lupron  every 3 months  # Normocytic anemia # Iron  deficiency anemia - Hemoglobin stable at 9.1, baseline around 8-9 - Last blood transfusion on 6/14 - Trend CBC  # HTN - BP elevated with SBP in the 130s to 150s - Resume home BP meds after completion of med rec  # Seizure disorder - Continue phenytoin  - Seizure precautions  # GERD - Continue Protonix   # Generalized weakness - In the setting of acute bacteremia - PT/OT eval and treat - Protein supplementation  DVT prophylaxis: Lovenox      Code Status: Full Code  Consults called: None  Family Communication: Discussed admission with spouse at bedside  Severity of  Illness: The appropriate patient status for this patient is OBSERVATION. Observation status is judged to be reasonable and necessary in order to provide the required intensity of service to ensure the patient's safety. The patient's presenting symptoms, physical exam findings, and initial radiographic and laboratory data in the context of their medical condition is felt to place them at decreased risk for further clinical deterioration. Furthermore, it is anticipated that the patient will be medically stable for discharge from the hospital within 2 midnights of admission.   Level of care: Med-Surg   This record has been created using Conservation officer, historic buildings. Errors have been sought and corrected, but may not always be located. Such creation errors do not reflect on the standard of care.   Lou Claretta HERO, MD 03/08/2024, 7:43 PM [facility] Pager: [phone] Isaiah 41:10   If 7PM-7AM, please contact night-coverage [email or web address] Password [email or web address]

## 2024-03-08 NOTE — ED Provider Notes (Signed)
 [place] EMERGENCY DEPARTMENT AT [facility] Provider Note   CSN: [id number] Arrival date & time: 03/08/24  1449     Patient presents with: Abnormal Lab   Joseph Rangel is a 78 y.o. male.   78 year old male presents due to abnormal labs.  Apparently patient was seen a few days ago and had blood cultures done which came back with gram-positive rods.  Patient home denies any fever or chills.  No nausea or vomiting.  No cough or congestion.  No urinary symptoms.  Denies any rashes.  States that he feels at his baseline at this time.  Wife is at bedside who confirms this.       Prior to Admission medications   Medication Sig Start Date End Date Taking? Authorizing Provider  abiraterone  acetate (ZYTIGA ) 250 MG tablet TAKE 4 TABLETS BY MOUTH DAILY ON AN EMPTY STOMACH, 1 HR BEFORE OR 2 HRS AFTER A MEAL 03/06/24   Iruku, Praveena, MD  acetaminophen  (TYLENOL ) 325 MG tablet Take 2 tablets (650 mg total) by mouth every 6 (six) hours as needed for mild pain (or Fever >/= 101). 06/22/23   Rai, Ripudeep Linnell Richardson, MD  atenolol  (TENORMIN ) 25 MG tablet Take 25 mg by mouth daily.    [provider]  buprenorphine  (BUTRANS ) 20 MCG/HR PTWK Place 1 patch onto the skin once a week. 10/15/23   Haydee Lipa, MD  cyanocobalamin  1000 MCG tablet Take 1 tablet (1,000 mcg total) by mouth daily. 06/23/23   Rai, Hurman Maiden, MD  hydrochlorothiazide  (HYDRODIURIL ) 25 MG tablet Take 25 mg by mouth daily. 07/31/23   [provider]  HYDROcodone -acetaminophen  (NORCO) 10-325 MG tablet Take 1 tablet by mouth 4 (four) times daily as needed for severe pain (pain score 7-10). 10/15/23   Haydee Lipa, MD  olmesartan  (BENICAR ) 40 MG tablet Take 40 mg by mouth daily. 06/26/20   [provider]  omeprazole (PRILOSEC) 40 MG capsule Take 40 mg by mouth daily.    [provider]  phenytoin  (DILANTIN ) 100 MG ER capsule Take 300 mg by mouth at bedtime.    [provider]   potassium chloride  SA (KLOR-CON  M) 20 MEQ tablet Take 1 tablet (20 mEq total) by mouth daily. Can substitute to the packet 03/06/24   Alissa April, MD  predniSONE  (DELTASONE ) 5 MG tablet Take 1 tablet (5 mg total) by mouth daily with breakfast. 02/29/24   Iruku, Praveena, MD  senna-docusate (SENOKOT-S) 8.6-50 MG tablet Take 1 tablet by mouth at bedtime as needed for mild constipation. 06/22/23   Rai, Ripudeep K, MD  verapamil  (VERELAN  PM) 360 MG 24 hr capsule Take 360 mg by mouth daily. 04/01/20   [provider]    Allergies: Patient has no known allergies.    Review of Systems  All other systems reviewed and are negative.   Updated Vital Signs BP (!) 154/86   Pulse 76   Temp 98.1 F (36.7 C)   Resp 18   SpO2 100%   Physical Exam Vitals and nursing note reviewed.  Constitutional:      General: He is not in acute distress.    Appearance: Normal appearance. He is well-developed. He is not toxic-appearing.  HENT:     Head: Normocephalic and atraumatic.   Eyes:     General: Lids are normal.     Conjunctiva/sclera: Conjunctivae normal.     Pupils: Pupils are equal, round, and reactive to light.   Neck:     Thyroid : No  thyroid  mass.     Trachea: No tracheal deviation.   Cardiovascular:     Rate and Rhythm: Normal rate and regular rhythm.     Heart sounds: Normal heart sounds. No murmur heard.    No gallop.  Pulmonary:     Effort: Pulmonary effort is normal. No respiratory distress.     Breath sounds: Normal breath sounds. No stridor. No decreased breath sounds, wheezing, rhonchi or rales.  Abdominal:     General: There is no distension.     Palpations: Abdomen is soft.     Tenderness: There is no abdominal tenderness. There is no rebound.   Musculoskeletal:        General: No tenderness. Normal range of motion.     Cervical back: Normal range of motion and neck supple.   Skin:    General: Skin is warm and dry.     Findings: No abrasion or rash.    Neurological:     Mental Status: He is alert and oriented to person, place, and time. Mental status is at baseline.     GCS: GCS eye subscore is 4. GCS verbal subscore is 5. GCS motor subscore is 6.     Cranial Nerves: No cranial nerve deficit.     Sensory: No sensory deficit.     Motor: Motor function is intact.   Psychiatric:        Attention and Perception: Attention normal.        Speech: Speech normal.        Behavior: Behavior normal.     (all labs ordered are listed, but only abnormal results are displayed) Labs Reviewed  COMPREHENSIVE METABOLIC PANEL WITH GFR - Abnormal; Notable for the following components:      Result Value   Sodium 134 (*)    Potassium 3.2 (*)    Glucose, Bld 114 (*)    Calcium 8.4 (*)    Albumin 3.4 (*)    AST 13 (*)    All other components within normal limits  CBC WITH DIFFERENTIAL/PLATELET - Abnormal; Notable for the following components:   WBC 12.7 (*)    RBC 3.49 (*)    Hemoglobin 9.1 (*)    HCT 29.8 (*)    RDW 15.8 (*)    Neutro Abs 9.1 (*)    Abs Immature Granulocytes 0.08 (*)    All other components within normal limits  URINALYSIS, W/ REFLEX TO CULTURE (INFECTION SUSPECTED) - Abnormal; Notable for the following components:   APPearance HAZY (*)    Hgb urine dipstick SMALL (*)    Protein, ur 30 (*)    Leukocytes,Ua TRACE (*)    Bacteria, UA RARE (*)    All other components within normal limits  CULTURE, BLOOD (ROUTINE X 2)  CULTURE, BLOOD (ROUTINE X 2)  I-STAT CG4 LACTIC ACID, ED  I-STAT CG4 LACTIC ACID, ED    EKG: None  Radiology: No results found.   Procedures   Medications Ordered in the ED  vancomycin  (VANCOCIN ) IVPB 1000 mg/200 mL premix (has no administration in time range)                                    Medical Decision Making Amount and/or Complexity of Data Reviewed Labs: ordered.  Risk Prescription drug management.   Patient's labs here are significant for white count of 12.7 thousand.  He is  afebrile.  Patient started on  IV vancomycin  and will admit for observation     Final diagnoses:  None    ED Discharge Orders     None          Lind Repine, MD 03/08/24 Larita Pluck

## 2024-03-08 NOTE — ED Triage Notes (Addendum)
 Patient was brought by his daughter, but he is unsure as to why he is here. Daughter left. He reports, something is wrong with my blood. Patient denies any symptoms.

## 2024-03-08 NOTE — ED Notes (Signed)
IV team in room now

## 2024-03-08 NOTE — ED Notes (Signed)
 Patient is a hard stick, unable to find any access for IV. Patient states they usually have a hard time finding veins on him.  Placed IV team consult at this time.

## 2024-03-09 ENCOUNTER — Telehealth (HOSPITAL_BASED_OUTPATIENT_CLINIC_OR_DEPARTMENT_OTHER): Payer: Self-pay | Admitting: *Deleted

## 2024-03-09 DIAGNOSIS — K219 Gastro-esophageal reflux disease without esophagitis: Secondary | ICD-10-CM | POA: Diagnosis present

## 2024-03-09 DIAGNOSIS — D509 Iron deficiency anemia, unspecified: Secondary | ICD-10-CM | POA: Diagnosis present

## 2024-03-09 DIAGNOSIS — E876 Hypokalemia: Secondary | ICD-10-CM | POA: Diagnosis present

## 2024-03-09 DIAGNOSIS — R7881 Bacteremia: Secondary | ICD-10-CM | POA: Diagnosis present

## 2024-03-09 DIAGNOSIS — E785 Hyperlipidemia, unspecified: Secondary | ICD-10-CM | POA: Diagnosis present

## 2024-03-09 DIAGNOSIS — Z7952 Long term (current) use of systemic steroids: Secondary | ICD-10-CM | POA: Diagnosis not present

## 2024-03-09 DIAGNOSIS — C61 Malignant neoplasm of prostate: Secondary | ICD-10-CM | POA: Diagnosis present

## 2024-03-09 DIAGNOSIS — F17210 Nicotine dependence, cigarettes, uncomplicated: Secondary | ICD-10-CM | POA: Diagnosis present

## 2024-03-09 DIAGNOSIS — D849 Immunodeficiency, unspecified: Secondary | ICD-10-CM | POA: Diagnosis present

## 2024-03-09 DIAGNOSIS — N289 Disorder of kidney and ureter, unspecified: Secondary | ICD-10-CM | POA: Diagnosis present

## 2024-03-09 DIAGNOSIS — Z79899 Other long term (current) drug therapy: Secondary | ICD-10-CM | POA: Diagnosis not present

## 2024-03-09 DIAGNOSIS — I10 Essential (primary) hypertension: Secondary | ICD-10-CM | POA: Diagnosis present

## 2024-03-09 DIAGNOSIS — J189 Pneumonia, unspecified organism: Secondary | ICD-10-CM | POA: Diagnosis present

## 2024-03-09 DIAGNOSIS — J9601 Acute respiratory failure with hypoxia: Secondary | ICD-10-CM | POA: Diagnosis not present

## 2024-03-09 DIAGNOSIS — B9689 Other specified bacterial agents as the cause of diseases classified elsewhere: Secondary | ICD-10-CM | POA: Diagnosis present

## 2024-03-09 DIAGNOSIS — C799 Secondary malignant neoplasm of unspecified site: Secondary | ICD-10-CM | POA: Diagnosis present

## 2024-03-09 DIAGNOSIS — G40909 Epilepsy, unspecified, not intractable, without status epilepticus: Secondary | ICD-10-CM | POA: Diagnosis present

## 2024-03-09 DIAGNOSIS — Z79818 Long term (current) use of other agents affecting estrogen receptors and estrogen levels: Secondary | ICD-10-CM | POA: Diagnosis not present

## 2024-03-09 LAB — BASIC METABOLIC PANEL WITH GFR
Anion gap: 13 (ref 5–15)
BUN: 13 mg/dL (ref 8–23)
CO2: 20 mmol/L — ABNORMAL LOW (ref 22–32)
Calcium: 7.9 mg/dL — ABNORMAL LOW (ref 8.9–10.3)
Chloride: 104 mmol/L (ref 98–111)
Creatinine, Ser: 0.99 mg/dL (ref 0.61–1.24)
GFR, Estimated: >60 mL/min (ref >60)
Glucose, Bld: 91 mg/dL (ref 70–99)
Potassium: 3.1 mmol/L — ABNORMAL LOW (ref 3.5–5.1)
Sodium: 137 mmol/L (ref 135–145)

## 2024-03-09 LAB — CBC
HCT: 26.8 % — ABNORMAL LOW (ref 39.0–52.0)
Hemoglobin: 7.9 g/dL — ABNORMAL LOW (ref 13.0–17.0)
MCH: 25.6 pg — ABNORMAL LOW (ref 26.0–34.0)
MCHC: 29.5 g/dL — ABNORMAL LOW (ref 30.0–36.0)
MCV: 87 fL (ref 80.0–100.0)
Platelets: 237 10*3/uL (ref 150–400)
RBC: 3.08 MIL/uL — ABNORMAL LOW (ref 4.22–5.81)
RDW: 15.7 % — ABNORMAL HIGH (ref 11.5–15.5)
WBC: 8.7 10*3/uL (ref 4.0–10.5)
nRBC: 0 % (ref 0.0–0.2)

## 2024-03-09 LAB — MAGNESIUM: Magnesium: 1.8 mg/dL (ref 1.7–2.4)

## 2024-03-09 LAB — PHOSPHORUS: Phosphorus: 3.4 mg/dL (ref 2.5–4.6)

## 2024-03-09 MED ORDER — VANCOMYCIN HCL 1250 MG/250ML IV SOLN
1250.0000 mg | INTRAVENOUS | Status: DC
  Administered 2024-03-10 – 2024-03-11 (×2): 1250 mg via INTRAVENOUS
  Filled 2024-03-09 (×3): qty 250

## 2024-03-09 MED ORDER — POTASSIUM CHLORIDE CRYS ER 20 MEQ PO TBCR
20.0000 meq | EXTENDED_RELEASE_TABLET | Freq: Every day | ORAL | Status: DC
  Administered 2024-03-10 – 2024-03-14 (×5): 20 meq via ORAL
  Filled 2024-03-09 (×7): qty 1

## 2024-03-09 MED ORDER — PREDNISONE 5 MG PO TABS
5.0000 mg | ORAL_TABLET | Freq: Every day | ORAL | Status: DC
  Administered 2024-03-10 – 2024-03-14 (×5): 5 mg via ORAL
  Filled 2024-03-09 (×6): qty 1

## 2024-03-09 MED ORDER — VITAMIN B-12 1000 MCG PO TABS
1000.0000 ug | ORAL_TABLET | Freq: Every day | ORAL | Status: DC
  Administered 2024-03-10 – 2024-03-14 (×5): 1000 ug via ORAL
  Filled 2024-03-09 (×6): qty 1

## 2024-03-09 MED ORDER — ACETAMINOPHEN 500 MG PO TABS
1000.0000 mg | ORAL_TABLET | Freq: Four times a day (QID) | ORAL | Status: DC
  Administered 2024-03-10 – 2024-03-14 (×13): 1000 mg via ORAL
  Filled 2024-03-09 (×16): qty 2

## 2024-03-09 MED ORDER — PHENYTOIN SODIUM EXTENDED 100 MG PO CAPS
300.0000 mg | ORAL_CAPSULE | Freq: Every day | ORAL | Status: DC
  Administered 2024-03-10 – 2024-03-13 (×4): 300 mg via ORAL
  Filled 2024-03-09 (×6): qty 3

## 2024-03-09 MED ORDER — ABIRATERONE ACETATE 250 MG PO TABS: 1000.0000 mg | ORAL_TABLET | Freq: Every morning | ORAL | Status: DC

## 2024-03-09 MED ORDER — DM-GUAIFENESIN ER 30-600 MG PO TB12
1.0000 | ORAL_TABLET | Freq: Two times a day (BID) | ORAL | Status: DC | PRN
  Administered 2024-03-11 – 2024-03-14 (×3): 1 via ORAL
  Filled 2024-03-09 (×3): qty 1

## 2024-03-09 MED ORDER — PANTOPRAZOLE SODIUM 40 MG PO TBEC
40.0000 mg | DELAYED_RELEASE_TABLET | Freq: Every day | ORAL | Status: DC
  Administered 2024-03-10 – 2024-03-14 (×5): 40 mg via ORAL
  Filled 2024-03-09 (×6): qty 1

## 2024-03-09 MED ORDER — ENSURE PLUS HIGH PROTEIN PO LIQD
237.0000 mL | Freq: Two times a day (BID) | ORAL | Status: DC
  Administered 2024-03-10 – 2024-03-14 (×7): 237 mL via ORAL

## 2024-03-09 NOTE — Evaluation (Signed)
 Physical Therapy Evaluation Patient Details Name: Joseph Rangel MRN: [id number] DOB: 12/12/1945 Today's Date: 03/09/2024  History of Present Illness  Raymondo Chalfin is a 78 y.o. male  presented to the ER with weakness. Pt admitted for bacteremia and hypokalemia. PMH: metastatic prostate cancer on Zytiga , prednisone  and Lupron , HTN, arthritis, anemia, HLD, GERD, seizure disorder, chronic hypokalemia who was advised to present to the ED due to gram-positive rods from blood culture.  Clinical Impression  Pt admitted with above diagnosis.  Pt agreeable to PT. Requires assist at baseline for transfers, non-amb > 6 mos d/t LLE weakness. Pt reports his multiple children assist him and his wife. He is agreeable to HHPT only (declines SNF or any post acute rehab at this time). Will continue to follow in acute setting  Pt currently with functional limitations due to the deficits listed below (see PT Problem List). Pt will benefit from acute skilled PT to increase their independence and safety with mobility to allow discharge.           If plan is discharge home, recommend the following: A little help with walking and/or transfers;A little help with bathing/dressing/bathroom;Assistance with cooking/housework;Assist for transportation;Help with stairs or ramp for entrance   Can travel by private vehicle        Equipment Recommendations None recommended by PT  Recommendations for Other Services       Functional Status Assessment Patient has had a recent decline in their functional status and demonstrates the ability to make significant improvements in function in a reasonable and predictable amount of time.     Precautions / Restrictions Precautions Precautions: Fall Restrictions Weight Bearing Restrictions Per Provider Order: No      Mobility  Bed Mobility Overal bed mobility: Needs Assistance Bed Mobility: Sit to Supine       Sit to supine: Min assist   General bed mobility comments:  assist to lift LLE on to bed. pt able to scoot laterally and position self once in supine    Transfers Overall transfer level: Needs assistance Equipment used: Rolling walker (2 wheels) Transfers: Sit to/from Stand, Bed to chair/wheelchair/BSC Sit to Stand: Min assist   Step pivot transfers: Min assist       General transfer comment: assist to rise and transition to RW. assist to steady for SPT chair to bed. no overt LOB    Ambulation/Gait               General Gait Details: non-amb at baseline  Stairs            Wheelchair Mobility     Tilt Bed    Modified Rankin (Stroke Patients Only)       Balance Overall balance assessment: Needs assistance         Standing balance support: During functional activity, Reliant on assistive device for balance Standing balance-Leahy Scale: Poor                               Pertinent Vitals/Pain Pain Assessment Pain Assessment: No/denies pain    Home Living Family/patient expects to be discharged to:: Private residence Living Arrangements: Spouse/significant other;Other relatives Available Help at Discharge: Family;Other (Comment) (daughter) Type of Home: House Home Access: Level entry       Home Layout: One level Home Equipment: BSC/3in1;Rolling Walker (2 wheels);Cane - single point;Shower seat;Wheelchair - manual Additional Comments: has 15 children that help him and his wife who is on dialysis  Prior Function Prior Level of Function : Needs assist       Physical Assist : Mobility (physical) Mobility (physical): Bed mobility;Transfers   Mobility Comments: limited transfers recently with support, nonambulatory due to L LE weakness for > 6 mos       Extremity/Trunk Assessment   Upper Extremity Assessment Upper Extremity Assessment: Defer to OT evaluation    Lower Extremity Assessment Lower Extremity Assessment: RLE deficits/detail;LLE deficits/detail RLE Deficits / Details: AROM  grossly WFL; MMT grossly 4/5 LLE Deficits / Details: ankle df 1/5, knee extension 2+/5, knee flexion 3/5; hip grossly 3+/5       Communication   Communication Communication: No apparent difficulties    Cognition Arousal: Alert Behavior During Therapy: WFL for tasks assessed/performed   PT - Cognitive impairments: No family/caregiver present to determine baseline, Initiation                       PT - Cognition Comments: delayed processing Following commands: Intact       Cueing Cueing Techniques: Verbal cues     General Comments      Exercises     Assessment/Plan    PT Assessment Patient needs continued PT services  PT Problem List Decreased strength;Decreased range of motion;Decreased activity tolerance;Decreased balance;Decreased knowledge of use of DME;Decreased mobility;Decreased coordination       PT Treatment Interventions DME instruction;Functional mobility training;Therapeutic activities;Patient/family education;Therapeutic exercise    PT Goals (Current goals can be found in the Care Plan section)  Acute Rehab PT Goals PT Goal Formulation: With patient Time For Goal Achievement: 03/23/24 Potential to Achieve Goals: Good    Frequency Min 2X/week     Co-evaluation               AM-PAC PT 6 Clicks Mobility  Outcome Measure Help needed turning from your back to your side while in a flat bed without using bedrails?: A Little Help needed moving from lying on your back to sitting on the side of a flat bed without using bedrails?: A Little Help needed moving to and from a bed to a chair (including a wheelchair)?: A Little Help needed standing up from a chair using your arms (e.g., wheelchair or bedside chair)?: A Little Help needed to walk in hospital room?: A Lot Help needed climbing 3-5 steps with a railing? : Total 6 Click Score: 15    End of Session Equipment Utilized During Treatment: Gait belt Activity Tolerance: Patient tolerated  treatment well Patient left: with call bell/phone within reach;in bed;with nursing/sitter in room;with bed alarm set   PT Visit Diagnosis: Other abnormalities of gait and mobility (R26.89)    Time: 6045-4098 PT Time Calculation (min) (ACUTE ONLY): 16 min   Charges:   PT Evaluation $PT Eval Low Complexity: 1 Low   PT General Charges $$ ACUTE PT VISIT: 1 Visit         Anthonee Gelin, PT  Acute Rehab Dept [facility]) [phone]  03/09/2024   [facility] 03/09/2024, 11:33 AM

## 2024-03-09 NOTE — TOC Initial Note (Addendum)
 Transition of Care [facility]) - Initial/Assessment Note    Patient Details  Name: Joseph Rangel MRN: [id number] Date of Birth: 1945-10-11  Transition of Care [facility]) CM/SW Contact:    Bari Leys, RN Phone Number: 03/09/2024, 1:53 PM  Clinical Narrative:   Met with patient at bedside to introduce role of TOC/NCM and review for dc planning, patient reports he resides with his wife, reports no current home care services, home DME: reports he has all the home equipment he needs, RW, Cane, BSC, Shower chair, etc). PT eval completed, recommendation for [facility] PT/OT, pt agreeable, no preference. TOC will continue to follow.  Request sent to attending for order for [facility] PT/OT.   MOON completed.             Expected Discharge Plan: Home w Home Health Services Barriers to Discharge: Continued Medical Work up   Patient Goals and CMS Choice Patient states their goals for this hospitalization and ongoing recovery are:: return home CMS Medicare.gov Compare Post Acute Care list provided to:: Patient Choice offered to / list presented to : Patient [place] ownership interest in [facility].provided to:: Patient    Expected Discharge Plan and Services       Living arrangements for the past 2 months: Single Family Home                                      Prior Living Arrangements/Services Living arrangements for the past 2 months: Single Family Home Lives with:: Spouse Patient language and need for interpreter reviewed:: Yes Do you feel safe going back to the place where you live?: Yes      Need for Family Participation in Patient Care: Yes (Comment) Care giver support system in place?: Yes (comment) Current home services: DME (RW, Cane, BSC, Paediatric nurse, parient reports he has all DME needed) Criminal Activity/Legal Involvement Pertinent to Current Situation/Hospitalization: No - Comment as needed  Activities of Daily Living   ADL Screening (condition at time of  admission) Independently performs ADLs?: Yes (appropriate for developmental age) Is the patient deaf or have difficulty hearing?: Yes Does the patient have difficulty seeing, even when wearing glasses/contacts?: No Does the patient have difficulty concentrating, remembering, or making decisions?: No  Permission Sought/Granted                  Emotional Assessment Appearance:: Appears stated age Attitude/Demeanor/Rapport: Engaged Affect (typically observed): Accepting Orientation: : Oriented to Self, Oriented to Place, Oriented to  Time, Oriented to Situation Alcohol  / Substance Use: Not Applicable Psych Involvement: No (comment)  Admission diagnosis:  Bacteremia [R78.81] Patient Active Problem List   Diagnosis Date Noted   Bacteremia 03/08/2024   IDA (iron  deficiency anemia) 03/06/2024   Tobacco abuse 10/12/2023   Left leg weakness 10/11/2023   Essential hypertension 10/11/2023   Hyponatremia 06/17/2023   Generalized weakness 06/17/2023   Fall at home, initial encounter 06/17/2023   Normocytic normochromic anemia 06/25/2021   Hypokalemia 03/07/2021   AKI (acute kidney injury) (HCC) 01/31/2021   Right leg pain 01/31/2021   Prostate cancer metastatic to intraabdominal lymph node (HCC) 10/02/2020   PCP:  Wilburn Handler, MD Pharmacy:   [facility] [phone] - Jonette Nestle,  - [address] AT [facility] [address] [facility] [address] [place] [address] Phone: [phone] Fax: [phone]  [place] [address] [place] [address] Phone: [phone] Fax: [phone]  [facility] - [place], [place] - [address] [place] [address] Phone: [phone] Fax: [phone]  [facility] - [place], [place] - [address] [place] [address] Phone: [phone] Fax: [phone]     Social Drivers of Health  (SDOH) Social History: SDOH Screenings   Food Insecurity: No Food Insecurity (03/08/2024)  Housing: Low Risk  (03/08/2024)  Transportation Needs: No Transportation Needs (03/08/2024)  Utilities: Not At Risk (03/08/2024)  Social Connections: Moderately Isolated (03/08/2024)  Tobacco Use: High Risk (03/08/2024)   SDOH Interventions:     Readmission Risk Interventions    06/17/2023    3:29 PM  Readmission Risk Prevention Plan  Transportation Screening Complete  PCP or Specialist Appt within 5-7 Days Complete  Home Care Screening Complete  Medication Review (RN CM) Complete

## 2024-03-09 NOTE — Telephone Encounter (Signed)
 Post ED Visit - Positive Culture Follow-up  Culture report reviewed by antimicrobial stewardship pharmacist: [facility] Team []  Court Distance, Pharm.D. []  Skeet Duke, Pharm.D., BCPS AQ-ID []  Leslee Rase, Pharm.D., BCPS []  Garland Junk, Pharm.D., BCPS []  [place], [address].D., BCPS, AAHIVP []  Alcide Aly, Pharm.D., BCPS, AAHIVP []  Jerri Morale, PharmD, BCPS []  Graham Laws, PharmD, BCPS []  Cleda Curly, PharmD, BCPS []  Tamar Fairly, PharmD []  Ballard Levels, PharmD, BCPS []  Ollen Beverage, PharmD  [facility] Pharmacy Team [x]  Denson Flake PharmD []  Sherryle Don, PharmD []  Van Gelinas, PharmD []  Delila Felty, Rph []  [place]) [name], PharmD []  Augustina Block, PharmD []  Arie Kurtz, PharmD []  Sharlyn Deaner, PharmD []  Agnes Hose, PharmD []  Kendall Pauls, PharmD []  Gladstone Lamer, PharmD []  Armanda Bern, PharmD []  Tera Fellows, PharmD   Positive blood culture Patient is admitted at [facility] hospital no further patient follow-up is required at this time.  [name] [name] 03/09/2024, 1:17 PM

## 2024-03-09 NOTE — Progress Notes (Signed)
 Progress Note   Patient: Joseph Rangel ZOX:[id number] DOB: [birth year] DOA: 03/08/2024     0 DOS: the patient was seen and examined on 03/09/2024   Brief hospital course: The patient is a 78 yr old who presented to ED after being advised that he grew Gram Positive Rods from 1/2 blood culture that was drawn on 03/06/2024. The patient has been placed on Vancomycin . Infectious disease has been consulted. Home health PT/OT has been ordered. Awaiting speciation and susceptibilities.   Assessment and Plan: Nil Xiong is a 78 y.o. male with medical history significant for metastatic prostate cancer on Zytiga , prednisone  and Lupron , HTN, arthritis, anemia, HLD, GERD, seizure disorder, chronic hypokalemia who was advised to present to the ED due to gram-positive rods from blood culture and admitted for bacteremia.   # Bacteremia - Immunocompromised patient with acute onset of generalized weakness - Blood cultures on 6/16 shows gram-positive rods and growing bacillus species - Awaiting speciation and sensitivities. - Patient nontoxic-appearing, hemodynamically stable, afebrile, white count trending down - Continue IV vancomycin  - Follow-up repeat blood cultures - ID consult - Trend CBC, fever curve   # Hypokalemia - resolved.   # Metastatic prostate cancer - Followed by oncology - Continue daily Zytiga , daily prednisone  and Lupron  every 3 months   # Normocytic anemia # Iron  deficiency anemia - Hemoglobin stable at 9.1, baseline around 8-9 - Last blood transfusion on 6/14 - Trend CBC   # HTN - BP elevated with SBP in the 130s to 150s - Resume home BP meds after completion of med rec   # Seizure disorder - Continue phenytoin  - Seizure precautions   # GERD - Continue Protonix    # Generalized weakness - In the setting of acute bacteremia - PT/OT eval and treat - Protein supplementation   DVT prophylaxis: Lovenox       Code Status: Full Code   Consults called: None   Family  Communication: Discussed admission with spouse at bedside      Subjective: The patient is resting quietly. He is complaining of bilateral heel pain.  Physical Exam: Vitals:   03/08/24 2251 03/09/24 0104 03/09/24 0632 03/09/24 1403  BP: (!) 157/58 131/60 114/64 133/66  Pulse: 72 71 81 83  Resp: 20 17 17 17   Temp: 98.1 F (36.7 C) 97.8 F (36.6 C) 98.4 F (36.9 C) 99 F (37.2 C)  TempSrc: Oral Oral Oral   SpO2: 100% 99% 100% 100%  Weight: 67.3 kg     Height: 5' 8 (1.727 m)      Exam:  Constitutional:  The patient is awake, alert, and oriented x 3. Mild distress from heel pain. Respiratory:  No increased work of breathing. No wheezes, rales, or rhonchi No tactile fremitus Cardiovascular:  Regular rate and rhythm No murmurs, ectopy, or gallups. No lateral PMI. No thrills. Abdomen:  Abdomen is soft, non-tender, non-distended No hernias, masses, or organomegaly Normoactive bowel sounds.  Musculoskeletal:  No cyanosis, clubbing, or edema Skin:  No rashes, lesions, ulcers palpation of skin: no induration or nodules Neurologic:  CN 2-12 intact Sensation all 4 extremities intact Psychiatric:  Mental status Mood, affect appropriate Orientation to person, place, time  judgment and insight appear intact  Data Reviewed:  Blood cultures 03/06/2024, 03/18/2024.  Family Communication: Family at bedside.  Disposition: Status is: Inpatient Need for continued IV Vancomycin  until speciation and susceptibilities are available, and for ID evaluation.  Planned Discharge Destination: Home    Time spent: 34 minutes  Author: Jamyla Ard, DO  03/09/2024 3:32 PM  For on call review [email or web address].

## 2024-03-09 NOTE — Care Management Obs Status (Signed)
 MEDICARE OBSERVATION STATUS NOTIFICATION   Patient Details  Name: Joseph Rangel MRN: [id number] Date of Birth: [birth year]   Medicare Observation Status Notification Given:       Bari Leys, RN 03/09/2024, 1:29 PM

## 2024-03-09 NOTE — Plan of Care (Signed)

## 2024-03-09 NOTE — Progress Notes (Signed)
 Pharmacy Antibiotic Note  Joseph Rangel is a 78 y.o. male admitted on 03/08/2024 with bacteremia. PMH significant for metastatic prostate cancer.  Recent blood culture (03/06/24) = gram + rods and patient advised to come to ED.   Received Vancomycin  1500mg  IV x 1 in ED.  Pharmacy has been consulted for Vancomycin  dosing.  Plan: Vancomycin  1250 mg IV Q 24 hrs. Goal AUC 400-550.  Expected AUC: 506.2  SCr used: 1.05 Follow renal function F/u culture results & sensitivities  Height: 5' 8 (172.7 cm) Weight: 67.3 kg (148 lb 5.9 oz) IBW/kg (Calculated) : 68.4  Temp (24hrs), Avg:98.1 F (36.7 C), Min:98 F (36.7 C), Max:98.1 F (36.7 C)  Recent Labs  Lab 03/06/24 0102 03/06/24 0149 03/08/24 1618 03/08/24 1627 03/08/24 2207  WBC 15.7*  --  12.7*  --   --   CREATININE 1.30*  --  1.05  --   --   LATICACIDVEN  --  0.9  --  1.6 1.0    Estimated Creatinine Clearance: 55.2 mL/min (by C-G formula based on SCr of 1.05 mg/dL).    No Known Allergies  Antimicrobials this admission: 6/18 Vancomycin  >>      Dose adjustments this admission:    Microbiology results: 6/16 BCx: Gram + Rods Bacillus species 6/18 BCx:      Thank you for allowing pharmacy to be a part of this patient's care.  Rulon Councilman, PharmD 03/09/2024 12:42 AM

## 2024-03-09 NOTE — Plan of Care (Signed)
   Problem: Education: Goal: Knowledge of General Education information will improve Description Including pain rating scale, medication(s)/side effects and non-pharmacologic comfort measures Outcome: Progressing   Problem: Health Behavior/Discharge Planning: Goal: Ability to manage health-related needs will improve Outcome: Progressing   Problem: Clinical Measurements: Goal: Respiratory complications will improve Outcome: Progressing   Problem: Activity: Goal: Risk for activity intolerance will decrease Outcome: Progressing   Problem: Nutrition: Goal: Adequate nutrition will be maintained Outcome: Progressing   Problem: Coping: Goal: Level of anxiety will decrease Outcome: Progressing

## 2024-03-09 NOTE — Evaluation (Signed)
 Occupational Therapy Evaluation Patient Details Name: Joseph Rangel MRN: [id number] DOB: 1946/06/26 Today's Date: 03/09/2024   History of Present Illness   Joseph Rangel is a 78 y.o. male with medical history significant for metastatic prostate cancer on Zytiga , prednisone  and Lupron , HTN, arthritis, anemia, HLD, GERD, seizure disorder, chronic hypokalemia who was advised to present to the ED due to gram-positive rods from blood culture.  Per spouse, patient had blood transfusion on Saturday and felt weak right afterwards.  He continued to feel weak so they presented to the ER on Sunday.  Workup showed worsening hypokalemia, leukocytosis and stable anemia. He received potassium supplementation and blood cultures were obtained.  Per spouse, patient has been doing well over the last 2 days. They received a call to bring patient back to the hospital due to positive blood cultures. Pt admitted for bacteremia and hypokalemia.     Clinical Impressions PTA, patient was living with wife and family at home requiring assist from daughter for LB self care and was usin RW for short household ambulation. Family assist with higher level IADL's and transportation. Currently, patient presents with deficits outlined below (see OT Problem List for details) most significantly pain, generalized muscle weakness, decreased balance and activity tolerance impacting LB ADL's and functional mobility. Patient insistent to go home vs < 3 hrs rehab OT feels would be helpful and requesting to go home with family assist and agreeable to [facility] services. OT will continue to follow to progress safety and function acutely.      If plan is discharge home, recommend the following:   A lot of help with walking and/or transfers;A lot of help with bathing/dressing/bathroom;Assistance with cooking/housework;Direct supervision/assist for medications management;Direct supervision/assist for financial management;Assist for transportation;Help  with stairs or ramp for entrance     Functional Status Assessment   Patient has had a recent decline in their functional status and demonstrates the ability to make significant improvements in function in a reasonable and predictable amount of time.     Equipment Recommendations   None recommended by OT      Precautions/Restrictions   Precautions Precautions: Fall Restrictions Weight Bearing Restrictions Per Provider Order: No     Mobility Bed Mobility Overal bed mobility: Needs Assistance Bed Mobility: Rolling, Supine to Sit Rolling: Contact guard assist, +2 for physical assistance, +2 for safety/equipment, Used rails   Supine to sit: Min assist, HOB elevated, Used rails     General bed mobility comments: increased time with cues    Transfers Overall transfer level: Needs assistance Equipment used: Rolling walker (2 wheels) Transfers: Sit to/from Stand, Bed to chair/wheelchair/BSC Sit to Stand: Min assist     Step pivot transfers: Min assist     General transfer comment: bed to commode and recliner with 3-4 steps with RW and increased time and cues      Balance Overall balance assessment: Needs assistance         Standing balance support: During functional activity, Bilateral upper extremity supported, Reliant on assistive device for balance Standing balance-Leahy Scale: Poor                             ADL either performed or assessed with clinical judgement   ADL Overall ADL's : Needs assistance/impaired Eating/Feeding: Set up;Sitting   Grooming: Wash/dry hands;Wash/dry face;Set up;Sitting   Upper Body Bathing: Contact guard assist;Sitting   Lower Body Bathing: Moderate assistance;Sit to/from stand   Upper Body Dressing :  Minimal assistance;Sitting   Lower Body Dressing: Moderate assistance;Sitting/lateral leans   Toilet Transfer: Minimal assistance;BSC/3in1;Rolling walker (2 wheels)   Toileting- Clothing Manipulation and  Hygiene: Moderate assistance;Sitting/lateral lean Toileting - Clothing Manipulation Details (indicate cue type and reason): use of urinal with dit S     Functional mobility during ADLs: Minimal assistance;Rolling walker (2 wheels) General ADL Comments: cues and assist for LB reach     Vision Baseline Vision/History: 1 Wears glasses;0 No visual deficits Ability to See in Adequate Light: 0 Adequate Patient Visual Report: No change from baseline Vision Assessment?: Wears glasses for reading;No apparent visual deficits            Pertinent Vitals/Pain Pain Assessment Pain Assessment: Faces Pain Score: 2  Faces Pain Scale: Hurts a little bit Pain Location: B feet Pain Descriptors / Indicators: Burning Pain Intervention(s): Repositioned, Monitored during session, RN gave pain meds during session, Relaxation     Extremity/Trunk Assessment Upper Extremity Assessment Upper Extremity Assessment: Right hand dominant;Overall [facility] for tasks assessed   Lower Extremity Assessment Lower Extremity Assessment: Defer to PT evaluation RLE Deficits / Details: AROM grossly WFL; MMT grossly 4/5 LLE Deficits / Details: ankle df 1/5, knee extension 2+/5, knee flexion 3/5; hip grossly 3+/5   Cervical / Trunk Assessment Cervical / Trunk Assessment: Normal   Communication Communication Communication: No apparent difficulties   Cognition Arousal: Alert Behavior During Therapy: WFL for tasks assessed/performed Cognition: No apparent impairments             OT - Cognition Comments: mild slower processing but appears related to fatigue vs cognition                 Following commands: Intact       Cueing  General Comments   Cueing Techniques: Verbal cues  no skin issues noted           Home Living Family/patient expects to be discharged to:: Private residence Living Arrangements: Spouse/significant other;Other relatives Available Help at Discharge: Family;Other (Comment)  (daughter) Type of Home: House Home Access: Level entry     Home Layout: One level     Bathroom Shower/Tub: Tub/shower unit;Curtain   Bathroom Toilet: Standard Bathroom Accessibility: No   Home Equipment: Teacher, English as a foreign language (2 wheels);Cane - single point;Shower seat;Wheelchair - manual   Additional Comments: has 15 children that help him and his wife who is on dialysis      Prior Functioning/Environment Prior Level of Function : Needs assist       Physical Assist : Mobility (physical) Mobility (physical): Bed mobility;Transfers   Mobility Comments: limited transfers recently with support, nonambulatory due to L LE weakness for > 6 mos ADLs Comments: assist with LB self care and IADL's by daughter    OT Problem List: Decreased strength;Decreased activity tolerance;Impaired balance (sitting and/or standing);Decreased coordination;Decreased safety awareness;Decreased knowledge of use of DME or AE;Decreased knowledge of precautions;Pain   OT Treatment/Interventions: Self-care/ADL training;Therapeutic exercise;Energy conservation;Neuromuscular education;DME and/or AE instruction;Therapeutic activities;Patient/family education;Balance training      OT Goals(Current goals can be found in the care plan section)   Acute Rehab OT Goals Patient Stated Goal: to go home not rehab OT Goal Formulation: With patient Time For Goal Achievement: 03/23/24 Potential to Achieve Goals: Fair ADL Goals Pt Will Perform Lower Body Bathing: with contact guard assist;sit to/from stand Pt Will Perform Lower Body Dressing: with min assist;with adaptive equipment;sit to/from stand Pt Will Transfer to Toilet: with supervision;bedside commode;ambulating Pt Will Perform Toileting - Clothing Manipulation and hygiene:  with contact guard assist;sit to/from stand Pt/caregiver will Perform Home Exercise Program: With written HEP provided;With Supervision;Increased strength;Both right and left upper  extremity   OT Frequency:  Min 2X/week       AM-PAC OT 6 Clicks Daily Activity     Outcome Measure Help from another person eating meals?: A Little Help from another person taking care of personal grooming?: A Little Help from another person toileting, which includes using toliet, bedpan, or urinal?: A Little Help from another person bathing (including washing, rinsing, drying)?: A Lot Help from another person to put on and taking off regular upper body clothing?: A Little Help from another person to put on and taking off regular lower body clothing?: A Lot 6 Click Score: 16   End of Session Equipment Utilized During Treatment: Rolling walker (2 wheels);Gait belt Nurse Communication: Mobility status  Activity Tolerance: Patient tolerated treatment well Patient left: in chair;with call bell/phone within reach;with chair alarm set  OT Visit Diagnosis: Unsteadiness on feet (R26.81);Other abnormalities of gait and mobility (R26.89);Muscle weakness (generalized) (M62.81);Pain Pain - part of body:  (feet)                Time: 0935-1010 OT Time Calculation (min): 35 min Charges:  OT General Charges $OT Visit: 1 Visit OT Evaluation $OT Eval Low Complexity: 1 Low OT Treatments $Self Care/Home Management : 8-22 mins  Alejandrina Raimer OT/L Acute Rehabilitation Department  [phone]  03/09/2024, 1:34 PM

## 2024-03-10 DIAGNOSIS — R7881 Bacteremia: Secondary | ICD-10-CM | POA: Diagnosis not present

## 2024-03-10 LAB — BASIC METABOLIC PANEL WITH GFR
Anion gap: 11 (ref 5–15)
BUN: 17 mg/dL (ref 8–23)
CO2: 21 mmol/L — ABNORMAL LOW (ref 22–32)
Calcium: 8.4 mg/dL — ABNORMAL LOW (ref 8.9–10.3)
Chloride: 106 mmol/L (ref 98–111)
Creatinine, Ser: 1.02 mg/dL (ref 0.61–1.24)
GFR, Estimated: >60 mL/min (ref >60)
Glucose, Bld: 125 mg/dL — ABNORMAL HIGH (ref 70–99)
Potassium: 3.1 mmol/L — ABNORMAL LOW (ref 3.5–5.1)
Sodium: 138 mmol/L (ref 135–145)

## 2024-03-10 LAB — CBC WITH DIFFERENTIAL/PLATELET
Abs Immature Granulocytes: 0.08 10*3/uL — ABNORMAL HIGH (ref 0.00–0.07)
Basophils Absolute: 0.1 10*3/uL (ref 0.0–0.1)
Basophils Relative: 1 %
Eosinophils Absolute: 0.3 10*3/uL (ref 0.0–0.5)
Eosinophils Relative: 3 %
HCT: 26.8 % — ABNORMAL LOW (ref 39.0–52.0)
Hemoglobin: 8 g/dL — ABNORMAL LOW (ref 13.0–17.0)
Immature Granulocytes: 1 %
Lymphocytes Relative: 20 %
Lymphs Abs: 2 10*3/uL (ref 0.7–4.0)
MCH: 26.2 pg (ref 26.0–34.0)
MCHC: 29.9 g/dL — ABNORMAL LOW (ref 30.0–36.0)
MCV: 87.9 fL (ref 80.0–100.0)
Monocytes Absolute: 0.8 10*3/uL (ref 0.1–1.0)
Monocytes Relative: 8 %
Neutro Abs: 6.8 10*3/uL (ref 1.7–7.7)
Neutrophils Relative %: 67 %
Platelets: 259 10*3/uL (ref 150–400)
RBC: 3.05 MIL/uL — ABNORMAL LOW (ref 4.22–5.81)
RDW: 15.9 % — ABNORMAL HIGH (ref 11.5–15.5)
WBC: 9.9 10*3/uL (ref 4.0–10.5)
nRBC: 0 % (ref 0.0–0.2)

## 2024-03-10 MED ORDER — POTASSIUM CHLORIDE CRYS ER 20 MEQ PO TBCR
40.0000 meq | EXTENDED_RELEASE_TABLET | Freq: Once | ORAL | Status: AC
  Administered 2024-03-10: 40 meq via ORAL
  Filled 2024-03-10: qty 2

## 2024-03-10 NOTE — Plan of Care (Signed)
  Problem: Education: Goal: Knowledge of General Education information will improve Description: Including pain rating scale, medication(s)/side effects and non-pharmacologic comfort measures Outcome: Progressing   Problem: Health Behavior/Discharge Planning: Goal: Ability to manage health-related needs will improve Outcome: Progressing   Problem: Clinical Measurements: Goal: Ability to maintain clinical measurements within normal limits will improve Outcome: Progressing Goal: Will remain free from infection Outcome: Progressing Goal: Diagnostic test results will improve Outcome: Progressing Goal: Respiratory complications will improve Outcome: Progressing Goal: Cardiovascular complication will be avoided Outcome: Progressing   Problem: Activity: Goal: Risk for activity intolerance will decrease Outcome: Progressing   Problem: Nutrition: Goal: Adequate nutrition will be maintained Outcome: Progressing   Problem: Coping: Goal: Level of anxiety will decrease Outcome: Progressing   Problem: Elimination: Goal: Will not experience complications related to bowel motility Outcome: Progressing Goal: Will not experience complications related to urinary retention Outcome: Completed/Met   Problem: Pain Managment: Goal: General experience of comfort will improve and/or be controlled Outcome: Progressing   Problem: Safety: Goal: Ability to remain free from injury will improve Outcome: Progressing   Problem: Skin Integrity: Goal: Risk for impaired skin integrity will decrease Outcome: Progressing

## 2024-03-10 NOTE — Progress Notes (Signed)
 Progress Note   Patient: Joseph Rangel ZOX:[id number] DOB: October 14, 1945 DOA: 03/08/2024     1 DOS: the patient was seen and examined on 03/10/2024   Brief hospital course: The patient is a 78 yr old who presented to ED after being advised that he grew Gram Positive Rods from 1/2 blood culture that was drawn on 03/06/2024. The patient has been placed on Vancomycin . Infectious disease has been consulted. Home health PT/OT has been ordered. Awaiting speciation and susceptibilities as well as ID recommendations.  Assessment and Plan: Pauline Trainer is a 78 y.o. male with medical history significant for metastatic prostate cancer on Zytiga , prednisone  and Lupron , HTN, arthritis, anemia, HLD, GERD, seizure disorder, chronic hypokalemia who was advised to present to the ED due to gram-positive rods from blood culture and admitted for bacteremia.   # Bacteremia - Immunocompromised patient with acute onset of generalized weakness - Blood cultures on 6/16 shows gram-positive rods and growing bacillus species - Awaiting speciation and sensitivities. - Patient nontoxic-appearing, hemodynamically stable, afebrile, white count trending down. - Awaiting speciation and susceptibilities as well as ID recommendations. - Continue IV vancomycin  - Follow-up repeat blood cultures - ID consult - Trend CBC, fever curve   # Hypokalemia - Potassium 3.1 again this morning. Supplemented. Continue to monitor.   # Metastatic prostate cancer - Followed by oncology - Continue daily Zytiga , daily prednisone  and Lupron  every 3 months   # Normocytic anemia # Iron  deficiency anemia - Hemoglobin stable at 9.1, baseline around 8-9 - Last blood transfusion on 6/14 - Trend CBC   # HTN - BP elevated with SBP in the 130s to 150s - Resume home BP meds after completion of med rec   # Seizure disorder - Continue phenytoin  - Seizure precautions   # GERD - Continue Protonix    # Generalized weakness - In the setting of acute  bacteremia - PT/OT eval and treat - Protein supplementation   DVT prophylaxis: Lovenox       Code Status: Full Code   Consults called: None   Family Communication: Discussed admission with spouse at bedside      Subjective: The patient is resting quietly. No new complaints.  Physical Exam: Vitals:   03/09/24 1403 03/09/24 2147 03/10/24 0431 03/10/24 1422  BP: 133/66 133/74 131/83 (!) 152/85  Pulse: 83 80 84 (!) 104  Resp: 17 16 15 17   Temp: 99 F (37.2 C) 98.6 F (37 C) 98.7 F (37.1 C) 99.4 F (37.4 C)  TempSrc:  Oral Oral   SpO2: 100% 97% 97% 100%  Weight:      Height:       Exam:  Constitutional:  The patient is awake, alert, and oriented x 3. No acute distress. Respiratory:  No increased work of breathing. No wheezes, rales, or rhonchi No tactile fremitus Cardiovascular:  Regular rate and rhythm No murmurs, ectopy, or gallups. No lateral PMI. No thrills. Abdomen:  Abdomen is soft, non-tender, non-distended No hernias, masses, or organomegaly Normoactive bowel sounds.  Musculoskeletal:  No cyanosis, clubbing, or edema Skin:  No rashes, lesions, ulcers palpation of skin: no induration or nodules Neurologic:  CN 2-12 intact Sensation all 4 extremities intact Psychiatric:  Mental status Mood, affect appropriate Orientation to person, place, time  judgment and insight appear intact  Data Reviewed:  Blood cultures 03/06/2024, 03/18/2024.  Family Communication: Family at bedside.  Disposition: Status is: Inpatient Need for continued IV Vancomycin  until speciation and susceptibilities are available, and for ID evaluation.  Planned Discharge Destination:  Home    Time spent: 32 minutes  Author: Donyale Falcon, DO 03/10/2024 2:58 PM  For on call review [email or web address].

## 2024-03-10 NOTE — Progress Notes (Signed)
 Mobility Specialist - Progress Note   03/10/24 1122  Mobility  Activity Transferred from bed to chair  Level of Assistance Minimal assist, patient does 75% or more  Assistive Device Front wheel walker  Range of Motion/Exercises Active Assistive  Activity Response Tolerated well  Mobility Referral Yes  Mobility visit 1 Mobility  Mobility Specialist Start Time (ACUTE ONLY) 1112  Mobility Specialist Stop Time (ACUTE ONLY) 1122  Mobility Specialist Time Calculation (min) (ACUTE ONLY) 10 min   Pt was found in bed and agreeable to transfer to recliner chair. No complaints and was left on recliner chair with all needs met. Call bell in reach and chair alarm on.  Lorna Rose,  Mobility Specialist Can be reached via Secure Chat

## 2024-03-10 NOTE — Plan of Care (Signed)

## 2024-03-11 ENCOUNTER — Inpatient Hospital Stay (HOSPITAL_COMMUNITY)

## 2024-03-11 DIAGNOSIS — R7881 Bacteremia: Secondary | ICD-10-CM | POA: Diagnosis not present

## 2024-03-11 LAB — CBC WITH DIFFERENTIAL/PLATELET
Abs Immature Granulocytes: 0.11 10*3/uL — ABNORMAL HIGH (ref 0.00–0.07)
Basophils Absolute: 0 10*3/uL (ref 0.0–0.1)
Basophils Relative: 0 %
Eosinophils Absolute: 0.3 10*3/uL (ref 0.0–0.5)
Eosinophils Relative: 3 %
HCT: 25.7 % — ABNORMAL LOW (ref 39.0–52.0)
Hemoglobin: 7.5 g/dL — ABNORMAL LOW (ref 13.0–17.0)
Immature Granulocytes: 1 %
Lymphocytes Relative: 20 %
Lymphs Abs: 1.9 10*3/uL (ref 0.7–4.0)
MCH: 26 pg (ref 26.0–34.0)
MCHC: 29.2 g/dL — ABNORMAL LOW (ref 30.0–36.0)
MCV: 89.2 fL (ref 80.0–100.0)
Monocytes Absolute: 0.7 10*3/uL (ref 0.1–1.0)
Monocytes Relative: 7 %
Neutro Abs: 6.7 10*3/uL (ref 1.7–7.7)
Neutrophils Relative %: 69 %
Platelets: 224 10*3/uL (ref 150–400)
RBC: 2.88 MIL/uL — ABNORMAL LOW (ref 4.22–5.81)
RDW: 16 % — ABNORMAL HIGH (ref 11.5–15.5)
WBC: 9.7 10*3/uL (ref 4.0–10.5)
nRBC: 0 % (ref 0.0–0.2)

## 2024-03-11 LAB — BASIC METABOLIC PANEL WITH GFR
Anion gap: 12 (ref 5–15)
BUN: 14 mg/dL (ref 8–23)
CO2: 19 mmol/L — ABNORMAL LOW (ref 22–32)
Calcium: 8.4 mg/dL — ABNORMAL LOW (ref 8.9–10.3)
Chloride: 107 mmol/L (ref 98–111)
Creatinine, Ser: 0.84 mg/dL (ref 0.61–1.24)
GFR, Estimated: >60 mL/min (ref >60)
Glucose, Bld: 141 mg/dL — ABNORMAL HIGH (ref 70–99)
Potassium: 3.2 mmol/L — ABNORMAL LOW (ref 3.5–5.1)
Sodium: 138 mmol/L (ref 135–145)

## 2024-03-11 LAB — CULTURE, BLOOD (ROUTINE X 2)
Culture: NO GROWTH
Special Requests: ADEQUATE

## 2024-03-11 MED ORDER — POTASSIUM CHLORIDE CRYS ER 20 MEQ PO TBCR
40.0000 meq | EXTENDED_RELEASE_TABLET | ORAL | Status: AC
  Administered 2024-03-11 (×2): 40 meq via ORAL
  Filled 2024-03-11 (×2): qty 2

## 2024-03-11 MED ORDER — IPRATROPIUM-ALBUTEROL 0.5-2.5 (3) MG/3ML IN SOLN
3.0000 mL | Freq: Four times a day (QID) | RESPIRATORY_TRACT | Status: DC | PRN
  Administered 2024-03-12: 3 mL via RESPIRATORY_TRACT
  Filled 2024-03-11: qty 3

## 2024-03-11 NOTE — Plan of Care (Signed)

## 2024-03-11 NOTE — Plan of Care (Signed)

## 2024-03-11 NOTE — Progress Notes (Signed)
 Progress Note   Patient: Joseph Rangel FMW:[id number] DOB: 10/17/1945 DOA: 03/08/2024     2 DOS: the patient was seen and examined on 03/11/2024   Brief hospital course: The patient is a 78 yr old who presented to ED after being advised that he grew Gram Positive Rods from 1/2 blood culture that was drawn on 03/06/2024. The patient has been placed on Vancomycin . Infectious disease has been consulted. Home health PT/OT has been ordered.  ID states that if the repeat blood cultures do not grow anything in 72 hours, the one positive blood culture is probably a contaminant.   Assessment and Plan: Joseph Rangel is a 78 y.o. male with medical history significant for metastatic prostate cancer on Zytiga , prednisone  and Lupron , HTN, arthritis, anemia, HLD, GERD, seizure disorder, chronic hypokalemia who was advised to present to the ED due to gram-positive rods from blood culture and admitted for bacteremia.   # Bacteremia - Immunocompromised patient with acute onset of generalized weakness - Blood cultures on 6/16 shows gram-positive rods and growing bacillus species - Awaiting speciation and sensitivities. If repeat blood cultures have no growth at 72 hours, ID states that they believe that it is a contaminant.  - Patient nontoxic-appearing, hemodynamically stable, afebrile, white count trending down. - Awaiting speciation and susceptibilities as well as ID recommendations. - Continue IV vancomycin  - Follow-up repeat blood cultures - Trend CBC, fever curve   # Hypokalemia - Potassium 3.2 again this morning. Supplemented. Continue to monitor. -Recheck magnesium    # Metastatic prostate cancer - Followed by oncology - Continue daily Zytiga , daily prednisone  and Lupron  every 3 months   # Normocytic anemia # Iron  deficiency anemia - Hemoglobin stable at 9.1, baseline around 8-9 - Last blood transfusion on 6/14 - Trend CBC   # HTN - BP elevated with SBP in the 130s to 150s - Resume home BP meds  after completion of med rec   # Seizure disorder - Continue phenytoin  - Seizure precautions   # GERD - Continue Protonix    # Generalized weakness - In the setting of acute bacteremia - PT/OT eval and treat - Protein supplementation   DVT prophylaxis: Lovenox       Code Status: Full Code   Consults called: None   Family Communication: Discussed admission with spouse at bedside      Subjective: The patient is resting quietly. No new complaints.  Physical Exam: Vitals:   03/10/24 1422 03/10/24 2017 03/11/24 0530 03/11/24 1310  BP: (!) 152/85 (!) 147/80 (!) 140/79 (!) 159/91  Pulse: (!) 104 (!) 104 98 (!) 51  Resp: 17 19 16 17   Temp: 99.4 F (37.4 C) 98.6 F (37 C) 98.7 F (37.1 C) 98.3 F (36.8 C)  TempSrc:    Oral  SpO2: 100% 100% 100% 100%  Weight:      Height:       Exam:  Constitutional:  The patient is awake, alert, and oriented x 3. No acute distress. Respiratory:  No increased work of breathing. No wheezes, rales, or rhonchi No tactile fremitus Cardiovascular:  Regular rate and rhythm No murmurs, ectopy, or gallups. No lateral PMI. No thrills. Abdomen:  Abdomen is soft, non-tender, non-distended No hernias, masses, or organomegaly Normoactive bowel sounds.  Musculoskeletal:  No cyanosis, clubbing, or edema Skin:  No rashes, lesions, ulcers palpation of skin: no induration or nodules Neurologic:  CN 2-12 intact Sensation all 4 extremities intact Psychiatric:  Mental status Mood, affect appropriate Orientation to person, place, time  judgment  and insight appear intact  Data Reviewed:  Blood cultures 03/06/2024, 03/18/2024.  Family Communication: Family at bedside.  Disposition: Status is: Inpatient Need for continued IV Vancomycin  until speciation and susceptibilities are available, and for ID evaluation.  Planned Discharge Destination: Home    Time spent: 32 minutes  Author: Fynn Vanblarcom, DO 03/11/2024 2:24 PM  For on call  review [email or web address].

## 2024-03-11 NOTE — Plan of Care (Signed)
 ?  Problem: Coping: ?Goal: Level of anxiety will decrease ?Outcome: Progressing ?  ?Problem: Safety: ?Goal: Ability to remain free from injury will improve ?Outcome: Progressing ?  ?

## 2024-03-12 DIAGNOSIS — J189 Pneumonia, unspecified organism: Secondary | ICD-10-CM | POA: Diagnosis not present

## 2024-03-12 DIAGNOSIS — R7881 Bacteremia: Secondary | ICD-10-CM | POA: Diagnosis not present

## 2024-03-12 LAB — CBC WITH DIFFERENTIAL/PLATELET
Abs Immature Granulocytes: 0.17 10*3/uL — ABNORMAL HIGH (ref 0.00–0.07)
Basophils Absolute: 0.1 10*3/uL (ref 0.0–0.1)
Basophils Relative: 0 %
Eosinophils Absolute: 0.2 10*3/uL (ref 0.0–0.5)
Eosinophils Relative: 1 %
HCT: 26 % — ABNORMAL LOW (ref 39.0–52.0)
Hemoglobin: 7.7 g/dL — ABNORMAL LOW (ref 13.0–17.0)
Immature Granulocytes: 1 %
Lymphocytes Relative: 11 %
Lymphs Abs: 1.8 10*3/uL (ref 0.7–4.0)
MCH: 25.8 pg — ABNORMAL LOW (ref 26.0–34.0)
MCHC: 29.6 g/dL — ABNORMAL LOW (ref 30.0–36.0)
MCV: 87.2 fL (ref 80.0–100.0)
Monocytes Absolute: 1.1 10*3/uL — ABNORMAL HIGH (ref 0.1–1.0)
Monocytes Relative: 7 %
Neutro Abs: 12.6 10*3/uL — ABNORMAL HIGH (ref 1.7–7.7)
Neutrophils Relative %: 80 %
Platelets: 248 10*3/uL (ref 150–400)
RBC: 2.98 MIL/uL — ABNORMAL LOW (ref 4.22–5.81)
RDW: 16.4 % — ABNORMAL HIGH (ref 11.5–15.5)
WBC: 15.9 10*3/uL — ABNORMAL HIGH (ref 4.0–10.5)
nRBC: 0 % (ref 0.0–0.2)

## 2024-03-12 LAB — BASIC METABOLIC PANEL WITH GFR
Anion gap: 10 (ref 5–15)
BUN: 16 mg/dL (ref 8–23)
CO2: 18 mmol/L — ABNORMAL LOW (ref 22–32)
Calcium: 8.4 mg/dL — ABNORMAL LOW (ref 8.9–10.3)
Chloride: 110 mmol/L (ref 98–111)
Creatinine, Ser: 0.98 mg/dL (ref 0.61–1.24)
GFR, Estimated: >60 mL/min (ref >60)
Glucose, Bld: 122 mg/dL — ABNORMAL HIGH (ref 70–99)
Potassium: 4 mmol/L (ref 3.5–5.1)
Sodium: 138 mmol/L (ref 135–145)

## 2024-03-12 MED ORDER — BUPRENORPHINE 10 MCG/HR TD PTWK
2.0000 | MEDICATED_PATCH | TRANSDERMAL | Status: DC
  Administered 2024-03-12: 2 via TRANSDERMAL
  Filled 2024-03-12: qty 2

## 2024-03-12 MED ORDER — ATENOLOL 50 MG PO TABS
25.0000 mg | ORAL_TABLET | Freq: Every day | ORAL | Status: DC
  Administered 2024-03-12 – 2024-03-14 (×3): 25 mg via ORAL
  Filled 2024-03-12 (×3): qty 1

## 2024-03-12 MED ORDER — SODIUM CHLORIDE 0.9 % IV SOLN
1.0000 g | Freq: Once | INTRAVENOUS | Status: AC
  Administered 2024-03-12: 1 g via INTRAVENOUS
  Filled 2024-03-12: qty 10

## 2024-03-12 MED ORDER — HYDROCODONE-ACETAMINOPHEN 10-325 MG PO TABS
1.0000 | ORAL_TABLET | Freq: Four times a day (QID) | ORAL | Status: DC | PRN
  Filled 2024-03-12: qty 1

## 2024-03-12 MED ORDER — SODIUM CHLORIDE 0.9 % IV SOLN
500.0000 mg | Freq: Once | INTRAVENOUS | Status: AC
  Administered 2024-03-12: 500 mg via INTRAVENOUS
  Filled 2024-03-12: qty 5

## 2024-03-12 NOTE — Progress Notes (Signed)
       Overnight   NAME: Joseph Rangel MRN: [id number] DOB : 1946/01/11    Date of Service   03/12/2024   HPI/Events of Note    Notified by RN for concern of notation for Antibiotic order.  Per Attending notation :  Left basilar pneumonia New left basilar infiltrates. WBC increased from 9.7 to 15.9. The patient has been started on ceftriaxone  and azithromycin.    Interventions/ Plan   Orders added according to Attending notation and diagnosis.  Continue all Attending orders.      Lynwood Kipper BSN MSNA MSN ACNPC-AG Acute Care Nurse Practitioner [facility]

## 2024-03-12 NOTE — Plan of Care (Signed)

## 2024-03-12 NOTE — Progress Notes (Signed)
 Progress Note   Patient: Joseph Rangel FMW:[id number] DOB: 08-07-1946 DOA: 03/08/2024     3 DOS: the patient was seen and examined on 03/12/2024   Brief hospital course: The patient is a 78 yr old who presented to ED after being advised that he grew Gram Positive Rods from 1/2 blood culture that was drawn on 03/06/2024. The patient has been placed on Vancomycin . Infectious disease has been consulted. Home health PT/OT has been ordered.  ID states that if the repeat blood cultures do not grow anything in 72 hours, the one positive blood culture is probably a contaminant.   Last night the patient developed new shortness of breath. CXR was performed and demonstrated new left basilar focal pulmonary infiltrate. WBC has increased from 9.7 on 03/11/2024 to 15.9 this morning. The patient has been started on ceftriaxone  and azithromycin.   Assessment and Plan: Joseph Rangel is a 78 y.o. male with medical history significant for metastatic prostate cancer on Zytiga , prednisone  and Lupron , HTN, arthritis, anemia, HLD, GERD, seizure disorder, chronic hypokalemia who was advised to present to the ED due to gram-positive rods from blood culture and admitted for bacteremia.  Left basilar pneumonia New left basilar infiltrates. WBC increased from 9.7 to 15.9. The patient has been started on ceftriaxone  and azithromycin. Monitor closely.    Bacteremia - organism grown from 1/2 cultures has not grown from repeat blood cultures over 72 hours. It is likely a contaminant. - Immunocompromised patient with acute onset of generalized weakness - Blood cultures on 6/16 shows gram-positive rods and growing bacillus species - Awaiting speciation and sensitivities. If repeat blood cultures have no growth at 72 hours, ID states that they believe that it is a contaminant.  - Vancomycin  has been discontinued.    # Hypokalemia - Potassium 4.0 this morning. Continue to monitor electrolytes.   # Metastatic prostate cancer -  Followed by oncology - Continue daily Zytiga , daily prednisone  and Lupron  every 3 months   # Normocytic anemia # Iron  deficiency anemia - Hemoglobin stable at 9.1, baseline around 8-9 - Last blood transfusion on 6/14 - Trend CBC   # HTN - BP elevated with SBP in the 130s to 150s - Resume home BP meds after completion of med rec   # Seizure disorder - Continue phenytoin  - Seizure precautions   # GERD - Continue Protonix    # Generalized weakness - In the setting of acute bacteremia - PT/OT eval and treat - Protein supplementation   DVT prophylaxis: Lovenox       Code Status: Full Code   Consults called: None   Family Communication: Discussed admission with spouse at bedside      Subjective: The patient is resting quietly. No new complaints.  Physical Exam: Vitals:   03/12/24 0743 03/12/24 0751 03/12/24 0938 03/12/24 1329  BP:  (!) 143/80 (!) 157/75 (!) 156/85  Pulse: 95 97 95 96  Resp: 20 20 19 19   Temp:  98.4 F (36.9 C) 98.3 F (36.8 C) 98.8 F (37.1 C)  TempSrc:  Oral Oral Oral  SpO2: 99% 100% 100% 100%  Weight:      Height:       Exam:  Constitutional:  The patient is awake, alert, and oriented x 3. No acute distress. Respiratory:  No increased work of breathing. Positive for rales at left base. No tactile fremitus Cardiovascular:  Regular rate and rhythm No murmurs, ectopy, or gallups. No lateral PMI. No thrills. Abdomen:  Abdomen is soft, non-tender, non-distended No hernias,  masses, or organomegaly Normoactive bowel sounds.  Musculoskeletal:  No cyanosis, clubbing, or edema Skin:  No rashes, lesions, ulcers palpation of skin: no induration or nodules Neurologic:  CN 2-12 intact Sensation all 4 extremities intact Psychiatric:  Mental status Mood, affect appropriate Orientation to person, place, time  judgment and insight appear intact  Data Reviewed:  Blood cultures 03/06/2024, 03/18/2024. CXR CBC BMP  Family Communication:  None available  Disposition: Status is: Inpatient Need for continued IV Vancomycin  until speciation and susceptibilities are available, and for ID evaluation.  Planned Discharge Destination: Home    Time spent: 36 minutes  Author: Clarine Elrod, DO 03/12/2024 3:43 PM  For on call review [email or web address].

## 2024-03-12 NOTE — Plan of Care (Signed)
   Problem: Coping: Goal: Level of anxiety will decrease Outcome: Progressing   Problem: Pain Managment: Goal: General experience of comfort will improve and/or be controlled Outcome: Progressing   Problem: Safety: Goal: Ability to remain free from injury will improve Outcome: Progressing

## 2024-03-12 NOTE — Progress Notes (Signed)
 Rapid Response Event Note   Reason for Call : HR 120's and 130's   Initial Focused Assessment: pt alert and following commands.  Lung sounds decreased and rhonchi with increased WOB.  Pt denies pain.  However, pt very hot to touch.  Pt oral temperature is 103.  See flowsheet for VS.    Interventions: [facility], NP made aware of pt VS.  Primary RN to give pt tylenol .  Ice packs placed on pt, clothes removed and temperature in room adjusted.  Orders received per [facility], NP and initiated.   Plan of Care: Pt will remain in current location and primary nurse will continue to monitor and report.  Please call with any concerns.    Event Summary:   MD Notified: yes Call Time: 2250 Arrival Time: 2255 End Time: 2330  Ileta Ofarrell Lavern, RN

## 2024-03-12 NOTE — Progress Notes (Signed)
 Patient resting in bed, temp down to 99.9, HR still tachy in the 120's, respiration from 22-28, still wearing oxygen at 2 liters via nasal cannula, still denies any shortness of breath or chest pain, CXR completed, alerted on-call provider to view results, will continue to monitor and follow red mews protocol.

## 2024-03-13 DIAGNOSIS — R7881 Bacteremia: Secondary | ICD-10-CM | POA: Diagnosis not present

## 2024-03-13 LAB — COMPREHENSIVE METABOLIC PANEL WITH GFR
ALT: 9 U/L (ref 0–44)
AST: 15 U/L (ref 15–41)
Albumin: 2.6 g/dL — ABNORMAL LOW (ref 3.5–5.0)
Alkaline Phosphatase: 83 U/L (ref 38–126)
Anion gap: 8 (ref 5–15)
BUN: 16 mg/dL (ref 8–23)
CO2: 21 mmol/L — ABNORMAL LOW (ref 22–32)
Calcium: 8.2 mg/dL — ABNORMAL LOW (ref 8.9–10.3)
Chloride: 110 mmol/L (ref 98–111)
Creatinine, Ser: 0.88 mg/dL (ref 0.61–1.24)
GFR, Estimated: >60 mL/min (ref >60)
Glucose, Bld: 102 mg/dL — ABNORMAL HIGH (ref 70–99)
Potassium: 3.7 mmol/L (ref 3.5–5.1)
Sodium: 139 mmol/L (ref 135–145)
Total Bilirubin: 0.6 mg/dL (ref 0.0–1.2)
Total Protein: 6.2 g/dL — ABNORMAL LOW (ref 6.5–8.1)

## 2024-03-13 LAB — CBC WITH DIFFERENTIAL/PLATELET
Abs Immature Granulocytes: 0.08 10*3/uL — ABNORMAL HIGH (ref 0.00–0.07)
Basophils Absolute: 0 10*3/uL (ref 0.0–0.1)
Basophils Relative: 0 %
Eosinophils Absolute: 0.3 10*3/uL (ref 0.0–0.5)
Eosinophils Relative: 3 %
HCT: 23.8 % — ABNORMAL LOW (ref 39.0–52.0)
Hemoglobin: 7.1 g/dL — ABNORMAL LOW (ref 13.0–17.0)
Immature Granulocytes: 1 %
Lymphocytes Relative: 18 %
Lymphs Abs: 1.7 10*3/uL (ref 0.7–4.0)
MCH: 26 pg (ref 26.0–34.0)
MCHC: 29.8 g/dL — ABNORMAL LOW (ref 30.0–36.0)
MCV: 87.2 fL (ref 80.0–100.0)
Monocytes Absolute: 0.6 10*3/uL (ref 0.1–1.0)
Monocytes Relative: 6 %
Neutro Abs: 6.9 10*3/uL (ref 1.7–7.7)
Neutrophils Relative %: 72 %
Platelets: 241 10*3/uL (ref 150–400)
RBC: 2.73 MIL/uL — ABNORMAL LOW (ref 4.22–5.81)
RDW: 16.4 % — ABNORMAL HIGH (ref 11.5–15.5)
WBC: 9.5 10*3/uL (ref 4.0–10.5)
nRBC: 0.2 % (ref 0.0–0.2)

## 2024-03-13 LAB — PROCALCITONIN: Procalcitonin: 0.47 ng/mL

## 2024-03-13 MED ORDER — SODIUM CHLORIDE 0.9 % IV SOLN
3.0000 g | Freq: Four times a day (QID) | INTRAVENOUS | Status: DC
  Administered 2024-03-13 – 2024-03-14 (×3): 3 g via INTRAVENOUS
  Filled 2024-03-13 (×3): qty 8

## 2024-03-13 NOTE — Plan of Care (Signed)
 Problem: Education: Goal: Knowledge of General Education information will improve Description: Including pain rating scale, medication(s)/side effects and non-pharmacologic comfort measures Outcome: Progressing   Problem: Clinical Measurements: Goal: Ability to maintain clinical measurements within normal limits will improve Outcome: Progressing   Problem: Activity: Goal: Risk for activity intolerance will decrease Outcome: Progressing   Problem: Pain Managment: Goal: General experience of comfort will improve and/or be controlled Outcome: Progressing   Problem: Nutrition: Goal: Adequate nutrition will be maintained Outcome: Progressing   Problem: Safety: Goal: Ability to remain free from injury will improve Outcome: Progressing   Jon LULLA Reins, RN 03/13/24 4:50 PM

## 2024-03-13 NOTE — Plan of Care (Signed)
  Problem: Education: Goal: Knowledge of General Education information will improve Description: Including pain rating scale, medication(s)/side effects and non-pharmacologic comfort measures Outcome: Progressing   Problem: Health Behavior/Discharge Planning: Goal: Ability to manage health-related needs will improve Outcome: Progressing   Problem: Clinical Measurements: Goal: Ability to maintain clinical measurements within normal limits will improve Outcome: Progressing Goal: Will remain free from infection Outcome: Progressing Goal: Diagnostic test results will improve Outcome: Progressing Goal: Respiratory complications will improve Outcome: Progressing Goal: Cardiovascular complication will be avoided Outcome: Progressing   Problem: Activity: Goal: Risk for activity intolerance will decrease Outcome: Adequate for Discharge   Problem: Nutrition: Goal: Adequate nutrition will be maintained Outcome: Progressing   Problem: Coping: Goal: Level of anxiety will decrease Outcome: Adequate for Discharge   Problem: Elimination: Goal: Will not experience complications related to bowel motility Outcome: Completed/Met   Problem: Pain Managment: Goal: General experience of comfort will improve and/or be controlled Outcome: Adequate for Discharge   Problem: Safety: Goal: Ability to remain free from injury will improve Outcome: Progressing   Problem: Skin Integrity: Goal: Risk for impaired skin integrity will decrease Outcome: Progressing

## 2024-03-13 NOTE — Progress Notes (Signed)
 PROGRESS NOTE  Joseph Rangel  DOB: 1946-08-04  PCP: Leigh Lung, MD FMW:[id number]  DOA: 03/08/2024  LOS: 4 days  Hospital Day: 6  Brief narrative: Joseph Rangel is a 78 y.o. male with PMH significant for metastatic prostate cancer, HTN, HLD, chronic hypokalemia, GERD, anemia, arthritis, seizure disorder. Lives at home with wife, walks with a walker. 6/16, patient was seen in the ED for weakness, lethargy.  Workup was not conclusive.  Weakness was thought to be due to chronic hypokalemia.  As part of the workup, blood culture was also collected and patient was discharged home. Preliminary blood culture grew gram-positive rods in 1 out of 2 bottles and hence patient was called back to ED on 6/18.  Repeat blood culture was sent. Started on IV vancomycin  Admitted to [facility]  His hospitalization was complicated by new left lower lobe pneumonia  Subjective: Patient was seen and examined this morning. Pleasant African-American male.  Propped up in bed.  Not in distress.  On 2 L oxygen by nasal cannula.  Does not use oxygen at home. Coughing up phlegm On auscultation, has left basilar crackles. Chart reviewed. In the last 24 hours, no fever, heart rate in 80s, blood pressure in 140s, breathing room air. Labs showed WBC count at 9.5, hemoglobin low at 7.1, platelet at 241  Assessment and plan: Gram-positive bacteria in blood Blood culture sent on 6/16 showed gram-positive bacteria in 1 out of 2 bottles and hence patient was called back and admitted.  Repeat blood culture was sent and he was initially started on IV vancomycin .  Blood cultures did not show any further growth.  Previous hospitalist discussed with ID and discontinued vancomycin .  Left basilar pneumonia Acute hypoxic respite failure On the morning of 6/21, patient developed shortness of breath. Chest x-ray showed left basilar pulm infiltrates.  WBC count went up to 15.9. Patient was given 1 dose of IV Rocephin  and  azithromycin WBC count back to normal today.  No fever.  On low-flow oxygen. Patient continues to have left basilar crackles. Obtain procalcitonin level.  I would start on Unasyn IV. Check oxygen requirement on ambulation today. Recent Labs  Lab 03/08/24 1627 03/08/24 2207 03/09/24 0355 03/10/24 0336 03/11/24 0349 03/12/24 0326 03/13/24 0337  WBC  --   --  8.7 9.9 9.7 15.9* 9.5  LATICACIDVEN 1.6 1.0  --   --   --   --   --    Acute on chronic anemia Iron  deficiency anemia GERD Baseline hemoglobin close to 9.  Noted a steady drop in hemoglobin, is down to 7.1 this morning.  Last blood transfusion given on 6/14.  Repeat CBC and type and screen for tomorrow. Continue Protonix  Recent Labs    06/17/23 1046 06/18/23 0511 06/21/23 2221 06/22/23 0532 10/11/23 1802 10/11/23 2319 10/12/23 0605 10/12/23 1120 02/29/24 1317 03/06/24 0102 03/09/24 0355 03/10/24 0336 03/11/24 0349 03/12/24 0326 03/13/24 0337  HGB  --    < >  --    < > 8.6*  --   --    < > 7.0*   < > 7.9* 8.0* 7.5* 7.7* 7.1*  MCV  --    < >  --    < > 86.8  --   --    < > 80.1   < > 87.0 87.9 89.2 87.2 87.2  VITAMINB12 272  --  602  --   --   --  332  --   --   --   --   --   --   --   --  FOLATE  --   --  8.2  --   --   --  9.6  --   --   --   --   --   --   --   --   FERRITIN  --   --  200  --   --  26  --   --  13*  --   --   --   --   --   --   TIBC  --   --  155*  --   --  252  --   --   --   --   --   --   --   --   --   IRON   --   --  25*  --   --  54  --   --   --   --   --   --   --   --   --   RETICCTPCT  --   --  0.6  --  1.3  --   --   --   --   --   --   --   --   --   --    < > = values in this interval not displayed.   Metastatic prostate cancer Followed by oncology Continue daily Zytiga , daily prednisone  and Lupron  every 3 months   HTN PTA meds atenolol  25 mg daily, verapamil  360 mg daily  Currently continued on atenolol  only. Verapamil  remains on hold. Blood pressure in target  range  Seizure disorder Continue phenytoin  Seizure precautions   Generalized weakness PT eval pending today   Goals of care   Code Status: Full Code     DVT prophylaxis:  enoxaparin  (LOVENOX ) injection 40 mg Start: 03/08/24 2200   Antimicrobials: IV Unasyn Fluid: None Consultants: None Family Communication: None at bedside  Status: Inpatient Level of care:  Med-Surg   Patient is from: Home Needs to continue in-hospital care: Pending PT eval, remains on IV antibiotics Anticipated d/c to: Pending PT eval.  If able to ambulate, hopefully home tomorrow on oral antibiotics      Diet:  Diet Order             Diet regular Room service appropriate? Yes; Fluid consistency: Thin  Diet effective now                   Scheduled Meds:  acetaminophen   1,000 mg Oral Q6H   atenolol   25 mg Oral Daily   buprenorphine   2 patch Transdermal Weekly   cyanocobalamin   1,000 mcg Oral Daily   enoxaparin  (LOVENOX ) injection  40 mg Subcutaneous Q24H   feeding supplement  237 mL Oral BID BM   pantoprazole   40 mg Oral Daily   phenytoin   300 mg Oral QHS   potassium chloride  SA  20 mEq Oral Daily   predniSONE   5 mg Oral Q breakfast    PRN meds: dextromethorphan -guaiFENesin , HYDROcodone -acetaminophen , ipratropium-albuterol, ondansetron  **OR** ondansetron  (ZOFRAN ) IV, senna-docusate   Infusions:    Antimicrobials: Anti-infectives (From admission, onward)    Start     Dose/Rate Route Frequency Ordered Stop   03/12/24 2200  cefTRIAXone  (ROCEPHIN ) 1 g in sodium chloride  0.9 % 100 mL IVPB        1 g 200 mL/hr over 30 Minutes Intravenous  Once 03/12/24 2054 03/13/24 0726   03/12/24 2200  azithromycin (ZITHROMAX) 500 mg in sodium chloride  0.9 % 250 mL  IVPB        500 mg 250 mL/hr over 60 Minutes Intravenous  Once 03/12/24 2054 03/13/24 0726   03/09/24 2300  vancomycin  (VANCOREADY) IVPB 1250 mg/250 mL  Status:  Discontinued        1,250 mg 166.7 mL/hr over 90 Minutes Intravenous  Every 24 hours 03/09/24 0041 03/12/24 1548   03/08/24 1930  vancomycin  (VANCOCIN ) IVPB 1000 mg/200 mL premix  Status:  Discontinued        1,000 mg 200 mL/hr over 60 Minutes Intravenous  Once 03/08/24 1919 03/08/24 1921   03/08/24 1930  vancomycin  (VANCOREADY) IVPB 1500 mg/300 mL        1,500 mg 150 mL/hr over 120 Minutes Intravenous  Once 03/08/24 1921 03/09/24 0059       Objective: Vitals:   03/13/24 0641 03/13/24 0642  BP: (!) 145/84 (!) 145/84  Pulse: 84 84  Resp: 17 17  Temp: 98 F (36.7 C) 98 F (36.7 C)  SpO2: 100% 100%    Intake/Output Summary (Last 24 hours) at 03/13/2024 1038 Last data filed at 03/13/2024 0929 Gross per 24 hour  Intake 930.16 ml  Output 700 ml  Net 230.16 ml   Filed Weights   03/08/24 2251  Weight: 67.3 kg   Weight change:  Body mass index is 22.56 kg/m.   Physical Exam: General exam: Pleasant, elderly African-American male Skin: No rashes, lesions or ulcers. HEENT: Atraumatic, normocephalic, no obvious bleeding Lungs: Has left basilar crackles, otherwise normal CVS: S1, S2, no murmur,   GI/Abd: Soft, nontender, nondistended, bowel sound present,   CNS: Alert, awake, oriented x 3 Psychiatry: Mood appropriate,  Extremities: No pedal edema, no calf tenderness, ordered  Data Review: I have personally reviewed the laboratory data and studies available.  F/u labs ordered Unresulted Labs (From admission, onward)     Start     Ordered   03/14/24 0500  CBC with Differential/Platelet  Tomorrow morning,   R       Question:  Specimen collection method  Answer:  Lab=Lab collect   03/13/24 0723   03/14/24 0500  Basic metabolic panel with GFR  Tomorrow morning,   R       Question:  Specimen collection method  Answer:  Lab=Lab collect   03/13/24 0723   03/14/24 0500  Type and screen  Once,   R        03/13/24 0723   03/13/24 0721  Procalcitonin  Add-on,   AD       References:    Procalcitonin Lower Respiratory Tract Infection AND Sepsis  Procalcitonin Algorithm  Question:  Specimen collection method  Answer:  Lab=Lab collect   03/13/24 9278           Signed, Chapman Rota, MD [facility] 03/13/2024

## 2024-03-13 NOTE — Progress Notes (Signed)
 Occupational Therapy Treatment Patient Details Name: Joseph Rangel MRN: [id number] DOB: 10-30-1945 Today's Date: 03/13/2024   History of present illness Cornell Bourbon is a 78 y.o. male with medical history significant for metastatic prostate cancer on Zytiga , prednisone  and Lupron , HTN, arthritis, anemia, HLD, GERD, seizure disorder, chronic hypokalemia who was advised to present to the ED due to gram-positive rods from blood culture.  Per spouse, patient had blood transfusion on Saturday and felt weak right afterwards.  He continued to feel weak so they presented to the ER on Sunday.  Workup showed worsening hypokalemia, leukocytosis and stable anemia. He received potassium supplementation and blood cultures were obtained.  Per spouse, patient has been doing well over the last 2 days. They received a call to bring patient back to the hospital due to positive blood cultures. Pt admitted for bacteremia and hypokalemia.   OT comments  Patient seen for skilled OT session this am. Significant gains in overall mobility and general strength with amb with RW and recliner follow into hallway. O2 sats remained >94% on RA and PT secure chat to MD with findings. Improved overall functional reach for BADL's. Continue to recommend HHOT with family assist and ongoing Acute hospital level OT services to progress and allow for discharge.       If plan is discharge home, recommend the following:  A lot of help with walking and/or transfers;A lot of help with bathing/dressing/bathroom;Assistance with cooking/housework;Direct supervision/assist for medications management;Direct supervision/assist for financial management;Assist for transportation;Help with stairs or ramp for entrance   Equipment Recommendations  None recommended by OT       Precautions / Restrictions Precautions Precautions: Fall Restrictions Weight Bearing Restrictions Per Provider Order: No       Mobility Bed Mobility Overal bed mobility:  Needs Assistance Bed Mobility: Rolling, Supine to Sit     Supine to sit: Min assist, HOB elevated, Used rails     General bed mobility comments: increased time with cues    Transfers Overall transfer level: Needs assistance Equipment used: Rolling walker (2 wheels) Transfers: Sit to/from Stand, Bed to chair/wheelchair/BSC Sit to Stand: Min assist, Contact guard assist     Step pivot transfers: Min assist, Contact guard assist     General transfer comment: amb with recliner follow in hallway with RW     Balance Overall balance assessment: Needs assistance Sitting-balance support: Feet supported Sitting balance-Leahy Scale: Fair     Standing balance support: Bilateral upper extremity supported Standing balance-Leahy Scale: Poor                             ADL either performed or assessed with clinical judgement   ADL Overall ADL's : Needs assistance/impaired Eating/Feeding: Independent;Sitting   Grooming: Wash/dry hands;Wash/dry face;Sitting;Set up   Upper Body Bathing: Set up;Sitting   Lower Body Bathing: Minimal assistance;Sit to/from stand   Upper Body Dressing : Set up;Sitting   Lower Body Dressing: Minimal assistance;Sit to/from stand   Toilet Transfer: Minimal assistance;Contact guard assist;Rolling walker (2 wheels)   Toileting- Clothing Manipulation and Hygiene: Contact guard assist;Sitting/lateral lean Toileting - Clothing Manipulation Details (indicate cue type and reason): usign urinal with mod I     Functional mobility during ADLs: Contact guard assist;Rolling walker (2 wheels) General ADL Comments: cues and assist for LB reach    Extremity/Trunk Assessment Upper Extremity Assessment Upper Extremity Assessment: Right hand dominant;Overall [facility] for tasks assessed   Lower Extremity Assessment Lower Extremity Assessment: Defer  to PT evaluation        Vision   Vision Assessment?: Wears glasses for reading;No apparent visual  deficits         Communication Communication Communication: No apparent difficulties   Cognition Arousal: Alert Behavior During Therapy: WFL for tasks assessed/performed Cognition: No apparent impairments                               Following commands: Intact        Cueing   Cueing Techniques: Verbal cues             Pertinent Vitals/ Pain       Pain Assessment Pain Assessment: No/denies pain Pain Score: 2  Pain Location: B feet Pain Descriptors / Indicators: Burning Pain Intervention(s): Monitored during session, Repositioned   Frequency  Min 2X/week        Progress Toward Goals  OT Goals(current goals can now be found in the care plan section)  Progress towards OT goals: Progressing toward goals  Acute Rehab OT Goals Patient Stated Goal: to go home feeling better OT Goal Formulation: With patient Time For Goal Achievement: 03/23/24 Potential to Achieve Goals: Fair ADL Goals Pt Will Perform Lower Body Bathing: with contact guard assist;sit to/from stand Pt Will Perform Lower Body Dressing: with min assist;with adaptive equipment;sit to/from stand Pt Will Transfer to Toilet: with supervision;bedside commode;ambulating Pt Will Perform Toileting - Clothing Manipulation and hygiene: with contact guard assist;sit to/from stand Pt/caregiver will Perform Home Exercise Program: With written HEP provided;With Supervision;Increased strength;Both right and left upper extremity  Plan         AM-PAC OT 6 Clicks Daily Activity     Outcome Measure   Help from another person eating meals?: None Help from another person taking care of personal grooming?: None Help from another person toileting, which includes using toliet, bedpan, or urinal?: A Little Help from another person bathing (including washing, rinsing, drying)?: A Little Help from another person to put on and taking off regular upper body clothing?: None Help from another person to put on  and taking off regular lower body clothing?: A Little 6 Click Score: 21    End of Session Equipment Utilized During Treatment: Rolling walker (2 wheels);Gait belt  OT Visit Diagnosis: Unsteadiness on feet (R26.81);Other abnormalities of gait and mobility (R26.89);Muscle weakness (generalized) (M62.81);Pain   Activity Tolerance Patient tolerated treatment well   Patient Left in chair;with call bell/phone within reach;with chair alarm set   Nurse Communication Mobility status        Time: 1035-1100 OT Time Calculation (min): 25 min  Charges: OT General Charges $OT Visit: 1 Visit  Myesha Stillion OT/L Acute Rehabilitation Department  [phone]  03/13/2024, 11:52 AM

## 2024-03-13 NOTE — Progress Notes (Signed)
 Physical Therapy Treatment Patient Details Name: Joseph Rangel MRN: [id number] DOB: [birth year] Today's Date: 03/13/2024   History of Present Illness Joseph Rangel is a 78 y.o. male with medical history significant for metastatic prostate cancer on Zytiga , prednisone  and Lupron , HTN, arthritis, anemia, HLD, GERD, seizure disorder, chronic hypokalemia who was advised to present to the ED due to gram-positive rods from blood culture.  Per spouse, patient had blood transfusion on Saturday and felt weak right afterwards.  He continued to feel weak so they presented to the ER on Sunday.  Workup showed worsening hypokalemia, leukocytosis and stable anemia. He received potassium supplementation and blood cultures were obtained.  Per spouse, patient has been doing well over the last 2 days. They received a call to bring patient back to the hospital due to positive blood cultures. Pt admitted for bacteremia and hypokalemia.    PT Comments  Pt making excellent progress today. Amb ~ 68' with RW and CGA-min assist. SpO2=- 94-100% on RA Recommend HHPT at d/c      If plan is discharge home, recommend the following: A little help with walking and/or transfers;A little help with bathing/dressing/bathroom;Assistance with cooking/housework;Assist for transportation;Help with stairs or ramp for entrance   Can travel by private vehicle        Equipment Recommendations  None recommended by PT    Recommendations for Other Services       Precautions / Restrictions Precautions Precautions: Fall     Mobility  Bed Mobility Overal bed mobility: Needs Assistance Bed Mobility: Supine to Sit     Supine to sit: Min assist, HOB elevated, Used rails     General bed mobility comments: increased time with cues    Transfers Overall transfer level: Needs assistance Equipment used: Rolling walker (2 wheels) Transfers: Sit to/from Stand Sit to Stand: Min assist, Contact guard assist           General transfer  comment: light steadying assist to rise and transition to RW    Ambulation/Gait Ambulation/Gait assistance: Min assist, Contact guard assist Gait Distance (Feet): 60 Feet Assistive device: Rolling walker (2 wheels) Gait Pattern/deviations: Step-through pattern, Trunk flexed       General Gait Details: cues for trunk extension, step length, breathing   Stairs             Wheelchair Mobility     Tilt Bed    Modified Rankin (Stroke Patients Only)       Balance                                            Communication Communication Communication: No apparent difficulties  Cognition Arousal: Alert Behavior During Therapy: WFL for tasks assessed/performed   PT - Cognitive impairments: No apparent impairments                       PT - Cognition Comments: more interactive, conversant  today Following commands: Intact      Cueing Cueing Techniques: Verbal cues  Exercises      General Comments        Pertinent Vitals/Pain Pain Assessment Pain Assessment: 0-10 Faces Pain Scale: Hurts a little bit Pain Location: B feet Pain Descriptors / Indicators: Burning Pain Intervention(s): Monitored during session, Repositioned    Home Living  Prior Function            PT Goals (current goals can now be found in the care plan section) Acute Rehab PT Goals PT Goal Formulation: With patient Time For Goal Achievement: 03/23/24 Potential to Achieve Goals: Good Progress towards PT goals: Progressing toward goals    Frequency    Min 2X/week      PT Plan      Co-evaluation              AM-PAC PT 6 Clicks Mobility   Outcome Measure  Help needed turning from your back to your side while in a flat bed without using bedrails?: A Little Help needed moving from lying on your back to sitting on the side of a flat bed without using bedrails?: A Little Help needed moving to and from a bed to  a chair (including a wheelchair)?: A Little Help needed standing up from a chair using your arms (e.g., wheelchair or bedside chair)?: A Little Help needed to walk in hospital room?: A Little Help needed climbing 3-5 steps with a railing? : A Lot 6 Click Score: 17    End of Session Equipment Utilized During Treatment: Gait belt Activity Tolerance: Patient tolerated treatment well Patient left: in chair;with call bell/phone within reach   PT Visit Diagnosis: Other abnormalities of gait and mobility (R26.89)     Time: 1045-1100 PT Time Calculation (min) (ACUTE ONLY): 15 min  Charges:      PT General Charges $$ ACUTE PT VISIT: 1 Visit                     Rexene, PT  Acute Rehab Dept ([facility]) [phone]  03/13/2024    [facility] 03/13/2024, 11:12 AM

## 2024-03-14 ENCOUNTER — Other Ambulatory Visit: Payer: Self-pay

## 2024-03-14 ENCOUNTER — Other Ambulatory Visit (HOSPITAL_COMMUNITY): Payer: Self-pay

## 2024-03-14 DIAGNOSIS — R7881 Bacteremia: Secondary | ICD-10-CM | POA: Diagnosis not present

## 2024-03-14 LAB — CULTURE, BLOOD (ROUTINE X 2)
Culture: NO GROWTH
Culture: NO GROWTH

## 2024-03-14 LAB — CBC WITH DIFFERENTIAL/PLATELET
Abs Immature Granulocytes: 0.12 10*3/uL — ABNORMAL HIGH (ref 0.00–0.07)
Basophils Absolute: 0 10*3/uL (ref 0.0–0.1)
Basophils Relative: 0 %
Eosinophils Absolute: 0.3 10*3/uL (ref 0.0–0.5)
Eosinophils Relative: 4 %
HCT: 24.4 % — ABNORMAL LOW (ref 39.0–52.0)
Hemoglobin: 7.1 g/dL — ABNORMAL LOW (ref 13.0–17.0)
Immature Granulocytes: 1 %
Lymphocytes Relative: 20 %
Lymphs Abs: 1.8 10*3/uL (ref 0.7–4.0)
MCH: 25.6 pg — ABNORMAL LOW (ref 26.0–34.0)
MCHC: 29.1 g/dL — ABNORMAL LOW (ref 30.0–36.0)
MCV: 88.1 fL (ref 80.0–100.0)
Monocytes Absolute: 0.6 10*3/uL (ref 0.1–1.0)
Monocytes Relative: 7 %
Neutro Abs: 6 10*3/uL (ref 1.7–7.7)
Neutrophils Relative %: 68 %
Platelets: 260 10*3/uL (ref 150–400)
RBC: 2.77 MIL/uL — ABNORMAL LOW (ref 4.22–5.81)
RDW: 16 % — ABNORMAL HIGH (ref 11.5–15.5)
WBC: 8.9 10*3/uL (ref 4.0–10.5)
nRBC: 0 % (ref 0.0–0.2)

## 2024-03-14 LAB — BASIC METABOLIC PANEL WITH GFR
Anion gap: 10 (ref 5–15)
BUN: 16 mg/dL (ref 8–23)
CO2: 21 mmol/L — ABNORMAL LOW (ref 22–32)
Calcium: 8.1 mg/dL — ABNORMAL LOW (ref 8.9–10.3)
Chloride: 104 mmol/L (ref 98–111)
Creatinine, Ser: 0.92 mg/dL (ref 0.61–1.24)
GFR, Estimated: >60 mL/min (ref >60)
Glucose, Bld: 102 mg/dL — ABNORMAL HIGH (ref 70–99)
Potassium: 3.9 mmol/L (ref 3.5–5.1)
Sodium: 135 mmol/L (ref 135–145)

## 2024-03-14 LAB — TYPE AND SCREEN
ABO/RH(D): O POS
Antibody Screen: NEGATIVE

## 2024-03-14 MED ORDER — ENSURE PLUS HIGH PROTEIN PO LIQD: 237.0000 mL | Freq: Two times a day (BID) | ORAL | Status: AC

## 2024-03-14 MED ORDER — AMOXICILLIN-POT CLAVULANATE 875-125 MG PO TABS
1.0000 | ORAL_TABLET | Freq: Two times a day (BID) | ORAL | Status: DC
  Administered 2024-03-14: 1 via ORAL
  Filled 2024-03-14: qty 1

## 2024-03-14 MED ORDER — VERAPAMIL HCL ER 180 MG PO TBCR
360.0000 mg | EXTENDED_RELEASE_TABLET | Freq: Every day | ORAL | Status: DC
  Administered 2024-03-14: 360 mg via ORAL
  Filled 2024-03-14: qty 2

## 2024-03-14 MED ORDER — DM-GUAIFENESIN ER 30-600 MG PO TB12
1.0000 | ORAL_TABLET | Freq: Two times a day (BID) | ORAL | 0 refills | Status: AC | PRN
  Filled 2024-03-14: qty 20, 10d supply, fill #0

## 2024-03-14 MED ORDER — AMOXICILLIN-POT CLAVULANATE 875-125 MG PO TABS
1.0000 | ORAL_TABLET | Freq: Two times a day (BID) | ORAL | 0 refills | Status: AC
  Filled 2024-03-14: qty 10, 5d supply, fill #0

## 2024-03-14 NOTE — Care Management Important Message (Incomplete)
 Important Message  Patient Details IM Letter given. Name: Rollin Kotowski MRN: [id number] Date of Birth: [birth year]   Important Message Given:  Yes - Medicare IM     Melba Ates 03/14/2024, 10:04 AM

## 2024-03-14 NOTE — Progress Notes (Signed)
 Discharge medication delivered to [facility] LPN patients nurse D Johnie RN

## 2024-03-14 NOTE — TOC Transition Note (Addendum)
 Transition of Care [facility]) - Discharge Note   Patient Details  Name: Joseph Rangel MRN: [id number] Date of Birth: 17-Jul-1946  [facility] CM/SW Contact:  Alfonse JONELLE Rex, RN Phone Number: 03/14/2024, 10:50 AM   Clinical Narrative:   DC to Home , [facility] PT/OT arranged with [facility], added to AVS, RW to be delivered to bedside by [facility]. No further TOC needs.   -11:41am. Text received from [facility] with [employer] [facility], patient received a transport care recently, insurance won't cover RW, patient offered self pay for RW, pt declined per [place], team notified.     Final next level of care: Home w Home Health Services Barriers to Discharge: Barriers Resolved   Patient Goals and CMS Choice Patient states their goals for this hospitalization and ongoing recovery are:: return home CMS Medicare.gov Compare Post Acute Care list provided to:: Patient Choice offered to / list presented to : Patient [place] ownership interest in [facility].provided to:: Patient    Discharge Placement                       Discharge Plan and Services Additional resources added to the After Visit Summary for                  DME Arranged: Walker rolling DME Agency: [employer] Date DME Agency Contacted: 03/14/24 Time DME Agency Contacted: 1050 Representative spoke with at DME Agency: [facility] Arranged: PT, OT HH Agency: [facility]        Social Drivers of Health (SDOH) Interventions SDOH Screenings   Food Insecurity: No Food Insecurity (03/08/2024)  Housing: Low Risk  (03/08/2024)  Transportation Needs: No Transportation Needs (03/08/2024)  Utilities: Not At Risk (03/08/2024)  Social Connections: Moderately Isolated (03/08/2024)  Tobacco Use: High Risk (03/08/2024)     Readmission Risk Interventions    03/14/2024   10:49 AM 06/17/2023    3:29 PM  Readmission Risk Prevention Plan  Transportation Screening Complete Complete  PCP or Specialist Appt within 5-7  Days  Complete  PCP or Specialist Appt within 3-5 Days Complete   Home Care Screening  Complete  Medication Review (RN CM)  Complete  HRI or Home Care Consult Complete   Social Work Consult for Recovery Care Planning/Counseling Complete   Palliative Care Screening Not Applicable   Medication Review Oceanographer) Complete

## 2024-03-14 NOTE — Discharge Summary (Signed)
 Physician Discharge Summary  Peterson Mathey FMW:[id number] DOB: 01/08/1946 DOA: 03/08/2024  PCP: Leigh Lung, MD  Admit date: 03/08/2024 Discharge date: 03/14/2024  Admitted From: Home Discharge disposition: Home with home health  Recommendations at discharge:  Complete the course of antibiotic with 5 more days of oral Augmentin Follow-up with PCP as an outpatient for repeat CBC and repeat chest x-ray  Brief narrative: Joseph Rangel is a 78 y.o. male with PMH significant for metastatic prostate cancer, HTN, HLD, chronic hypokalemia, GERD, anemia, arthritis, seizure disorder. Lives at home with wife, walks with a walker. 6/16, patient was seen in the ED for weakness, lethargy.  Workup was not conclusive.  Weakness was thought to be due to chronic hypokalemia.  As part of the workup, blood culture was also collected and patient was discharged home. Preliminary blood culture grew gram-positive rods in 1 out of 2 bottles and hence patient was called back to ED on 6/18.  Repeat blood culture was sent. Started on IV vancomycin  Admitted to [facility]  His hospitalization was complicated by new left lower lobe pneumonia  Subjective: Patient was seen and examined this morning. Lying down in bed.  Not in distress.  Not on supplemental oxygen.  Feels better enough to go home today.  Assessment and plan: Gram-positive bacteria in blood Blood culture sent on 6/16 showed gram-positive bacteria in 1 out of 2 bottles and hence patient was called back and admitted.  Repeat blood culture was sent and he was initially started on IV vancomycin .  Blood cultures did not show any further growth.  Previous hospitalist discussed with ID and discontinued vancomycin .  Left basilar pneumonia Acute hypoxic respiratory failure On the morning of 6/21, patient developed shortness of breath. Chest x-ray showed left basilar pulm infiltrates.  WBC count went up to 15.9. Patient was given 1 dose of IV Rocephin  and  azithromycin WBC level normalized.  No fever.  Initially required supplemental oxygen. Unable to ambulate without oxygen. Patient continues to have mild left basilar crackles. Currently on IV Unasyn.  Plan to discharge home on 5 days of oral Augmentin. Recent Labs  Lab 03/08/24 1627 03/08/24 2207 03/09/24 0355 03/10/24 0336 03/11/24 0349 03/12/24 0326 03/13/24 0337 03/14/24 0350  WBC  --   --    < > 9.9 9.7 15.9* 9.5 8.9  LATICACIDVEN 1.6 1.0  --   --   --   --   --   --   PROCALCITON  --   --   --   --   --   --  0.47  --    < > = values in this interval not displayed.   Acute on chronic anemia Iron  deficiency anemia GERD Baseline hemoglobin close to 9.  Noted a steady drop in hemoglobin, is down to 7.1.  Stable.  No active bleeding.  Last blood transfusion given on 6/14.   Continue Protonix .  Follow-up as an outpatient for repeat labs. Recent Labs    06/17/23 1046 06/18/23 0511 06/21/23 2221 06/22/23 0532 10/11/23 1802 10/11/23 2319 10/12/23 9394 10/12/23 1120 02/29/24 1317 03/06/24 0102 03/10/24 0336 03/11/24 0349 03/12/24 0326 03/13/24 0337 03/14/24 0350  HGB  --    < >  --    < > 8.6*  --   --    < > 7.0*   < > 8.0* 7.5* 7.7* 7.1* 7.1*  MCV  --    < >  --    < > 86.8  --   --    < >  80.1   < > 87.9 89.2 87.2 87.2 88.1  VITAMINB12 272  --  602  --   --   --  332  --   --   --   --   --   --   --   --   FOLATE  --   --  8.2  --   --   --  9.6  --   --   --   --   --   --   --   --   FERRITIN  --   --  200  --   --  26  --   --  13*  --   --   --   --   --   --   TIBC  --   --  155*  --   --  252  --   --   --   --   --   --   --   --   --   IRON   --   --  25*  --   --  54  --   --   --   --   --   --   --   --   --   RETICCTPCT  --   --  0.6  --  1.3  --   --   --   --   --   --   --   --   --   --    < > = values in this interval not displayed.   Metastatic prostate cancer Followed by oncology Continue daily Zytiga , daily prednisone  and Lupron  every 3  months   HTN PTA meds atenolol  25 mg daily, verapamil  360 mg daily  Currently continued on atenolol  only. Verapamil  remains on hold. Blood pressure running elevated.  Resumed verapamil  this morning  Seizure disorder Continue phenytoin  Seizure precautions   Generalized weakness PT eval obtained.  Home health PT recommended  Goals of care   Code Status: Full Code   Diet:  Diet Order             Diet general           Diet regular Room service appropriate? Yes; Fluid consistency: Thin  Diet effective now                   Nutritional status:  Body mass index is 22.56 kg/m.       Wounds:  -    Discharge Exam:   Vitals:   03/13/24 0642 03/13/24 1306 03/13/24 2119 03/14/24 0559  BP: (!) 145/84 134/82 (!) 149/83 (!) 160/92  Pulse: 84 85 85 84  Resp: 17 15 18 18   Temp: 98 F (36.7 C) 98.1 F (36.7 C) 98.3 F (36.8 C) 98.2 F (36.8 C)  TempSrc: Oral  Oral Oral  SpO2: 100% 100% 100% 100%  Weight:      Height:        Body mass index is 22.56 kg/m.  General exam: Pleasant, elderly African-American male Skin: No rashes, lesions or ulcers. HEENT: Atraumatic, normocephalic, no obvious bleeding Lungs: Has mild left basilar crackles, otherwise normal CVS: S1, S2, no murmur,   GI/Abd: Soft, nontender, nondistended, bowel sound present,   CNS: Alert, awake, oriented x 3 Psychiatry: Mood appropriate,  Extremities: No pedal edema, no calf tenderness, ordered  Follow ups:    Follow-up Information     [facility] Follow up.   Why: [facility] Contact information: [address] [place] [address] [phone]         Leigh Lung, MD Follow up.   Specialty: Family Medicine Contact information: [address] [place] [address] [phone]         Leigh Lung, MD Follow up.   Specialty: Family Medicine Contact information: [address] [place] [address] [phone]                 Discharge Instructions:   Discharge Instructions     Call MD for:  difficulty breathing, headache or visual disturbances   Complete by: As directed    Call MD for:  extreme fatigue   Complete by: As directed    Call MD for:  hives   Complete by: As directed    Call MD for:  persistant dizziness or light-headedness   Complete by: As directed    Call MD for:  persistant nausea and vomiting   Complete by: As directed    Call MD for:  severe uncontrolled pain   Complete by: As directed    Call MD for:  temperature >100.4   Complete by: As directed    Diet general   Complete by: As directed    Discharge instructions   Complete by: As directed    Recommendations at discharge:   Complete the course of antibiotic with 5 more days of oral Augmentin  Follow-up with PCP as an outpatient for repeat CBC and repeat chest x-ray  General discharge instructions: Follow with Primary MD Leigh Lung, MD in 7 days  Please request your PCP  to go over your hospital tests, procedures, radiology results at the follow up. Please get your medicines reviewed and adjusted.  Your PCP may decide to repeat certain labs or tests as needed. Do not drive, operate heavy machinery, perform activities at heights, swimming or participation in water activities or provide baby sitting services if your were admitted for syncope or siezures until you have seen by Primary MD or a Neurologist and advised to do so again. [place]  Controlled Substance Reporting System database was reviewed. Do not drive, operate heavy machinery, perform activities at heights, swim, participate in water activities or provide baby-sitting services while on medications for pain, sleep and mood until your outpatient physician has reevaluated you and advised to do so again.  You are strongly recommended to comply with the dose, frequency and duration of prescribed  medications. Activity: As tolerated with Full fall precautions use walker/cane & assistance as needed Avoid using any recreational substances like cigarette, tobacco, alcohol , or non-prescribed drug. If you experience worsening of your admission symptoms, develop shortness of breath, life threatening emergency, suicidal or homicidal thoughts you must seek medical attention immediately by calling 911 or calling your MD immediately  if symptoms less severe. You must read complete instructions/literature along with all the possible adverse reactions/side effects for all the medicines you take and that have been prescribed to you. Take any new medicine only after you have completely understood and accepted all the possible adverse reactions/side effects.  Wear Seat belts while driving. You were cared for by a hospitalist during your hospital stay. If you have any questions about your discharge medications or the care you received while you were in the hospital after you are discharged, you can call the  unit and ask to speak with the hospitalist or the covering physician. Once you are discharged, your primary care physician will handle any further medical issues. Please note that NO REFILLS for any discharge medications will be authorized once you are discharged, as it is imperative that you return to your primary care physician (or establish a relationship with a primary care physician if you do not have one).   Increase activity slowly   Complete by: As directed        Discharge Medications:   Allergies as of 03/14/2024   No Known Allergies      Medication List     STOP taking these medications    hydrochlorothiazide  25 MG tablet Commonly known as: HYDRODIURIL    olmesartan  40 MG tablet Commonly known as: BENICAR        TAKE these medications    abiraterone  acetate 250 MG tablet Commonly known as: ZYTIGA  TAKE 4 TABLETS BY MOUTH DAILY ON AN EMPTY STOMACH, 1 HR BEFORE OR 2 HRS AFTER A  MEAL   acetaminophen  325 MG tablet Commonly known as: TYLENOL  Take 2 tablets (650 mg total) by mouth every 6 (six) hours as needed for mild pain (or Fever >/= 101).   amoxicillin-clavulanate 875-125 MG tablet Commonly known as: AUGMENTIN Take 1 tablet by mouth every 12 (twelve) hours for 5 days.   atenolol  25 MG tablet Commonly known as: TENORMIN  Take 25 mg by mouth daily.   buprenorphine  20 MCG/HR Ptwk Commonly known as: BUTRANS  Place 1 patch onto the skin once a week.   cyanocobalamin  1000 MCG tablet Take 1 tablet (1,000 mcg total) by mouth daily.   dextromethorphan -guaiFENesin  30-600 MG 12hr tablet Commonly known as: MUCINEX  DM Take 1 tablet by mouth 2 (two) times daily as needed for up to 5 days for cough.   feeding supplement Liqd Take 237 mLs by mouth 2 (two) times daily between meals.   HYDROcodone -acetaminophen  10-325 MG tablet Commonly known as: NORCO Take 1 tablet by mouth 4 (four) times daily as needed for severe pain (pain score 7-10).   omeprazole 40 MG capsule Commonly known as: PRILOSEC Take 40 mg by mouth daily.   phenytoin  100 MG ER capsule Commonly known as: DILANTIN  Take 300 mg by mouth at bedtime.   potassium chloride  SA 20 MEQ tablet Commonly known as: KLOR-CON  M Take 1 tablet (20 mEq total) by mouth daily. Can substitute to the packet   predniSONE  5 MG tablet Commonly known as: DELTASONE  Take 1 tablet (5 mg total) by mouth daily with breakfast.   senna-docusate 8.6-50 MG tablet Commonly known as: Senokot-S Take 1 tablet by mouth at bedtime as needed for mild constipation.   verapamil  360 MG 24 hr capsule Commonly known as: VERELAN  Take 360 mg by mouth daily.               Durable Medical Equipment  (From admission, onward)           Start     Ordered   03/13/24 1603  For home use only DME Walker rolling  Once       Question Answer Comment  Walker: With 5 Inch Wheels   Patient needs a walker to treat with the following  condition Impaired mobility      03/13/24 1602             The results of significant diagnostics from this hospitalization (including imaging, microbiology, ancillary and laboratory) are listed below for reference.    Procedures and Diagnostic  Studies:   No results found.   Labs:   Basic Metabolic Panel: Recent Labs  Lab 03/09/24 0355 03/10/24 0336 03/11/24 0349 03/12/24 0326 03/13/24 0337 03/14/24 0350  NA 137 138 138 138 139 135  K 3.1* 3.1* 3.2* 4.0 3.7 3.9  CL 104 106 107 110 110 104  CO2 20* 21* 19* 18* 21* 21*  GLUCOSE 91 125* 141* 122* 102* 102*  BUN 13 17 14 16 16 16   CREATININE 0.99 1.02 0.84 0.98 0.88 0.92  CALCIUM 7.9* 8.4* 8.4* 8.4* 8.2* 8.1*  MG 1.8  --   --   --   --   --   PHOS 3.4  --   --   --   --   --    GFR Estimated Creatinine Clearance: 63 mL/min (by C-G formula based on SCr of 0.92 mg/dL). Liver Function Tests: Recent Labs  Lab 03/08/24 1618 03/13/24 0337  AST 13* 15  ALT 8 9  ALKPHOS 113 83  BILITOT 0.5 0.6  PROT 7.2 6.2*  ALBUMIN 3.4* 2.6*   No results for input(s): LIPASE, AMYLASE in the last 168 hours. No results for input(s): AMMONIA in the last 168 hours. Coagulation profile No results for input(s): INR, PROTIME in the last 168 hours.  CBC: Recent Labs  Lab 03/10/24 0336 03/11/24 0349 03/12/24 0326 03/13/24 0337 03/14/24 0350  WBC 9.9 9.7 15.9* 9.5 8.9  NEUTROABS 6.8 6.7 12.6* 6.9 6.0  HGB 8.0* 7.5* 7.7* 7.1* 7.1*  HCT 26.8* 25.7* 26.0* 23.8* 24.4*  MCV 87.9 89.2 87.2 87.2 88.1  PLT 259 224 248 241 260   Cardiac Enzymes: No results for input(s): CKTOTAL, CKMB, CKMBINDEX, TROPONINI in the last 168 hours. BNP: Invalid input(s): POCBNP CBG: No results for input(s): GLUCAP in the last 168 hours. D-Dimer No results for input(s): DDIMER in the last 72 hours. Hgb A1c No results for input(s): HGBA1C in the last 72 hours. Lipid Profile No results for input(s): CHOL, HDL, LDLCALC,  TRIG, CHOLHDL, LDLDIRECT in the last 72 hours. Thyroid  function studies No results for input(s): TSH, T4TOTAL, T3FREE, THYROIDAB in the last 72 hours.  Invalid input(s): FREET3 Anemia work up No results for input(s): VITAMINB12, FOLATE, FERRITIN, TIBC, IRON , RETICCTPCT in the last 72 hours. Microbiology Recent Results (from the past 240 hours)  Blood Culture (routine x 2)     Status: Abnormal   Collection Time: 03/06/24  1:02 AM   Specimen: BLOOD RIGHT FOREARM  Result Value Ref Range Status   Specimen Description   Final    BLOOD RIGHT FOREARM Performed at [facility], [address] [place], [place] [address]    Special Requests   Final    BOTTLES DRAWN AEROBIC AND ANAEROBIC Blood Culture adequate volume Performed at [facility], [address], [place], [place] [address]    Culture  Setup Time   Final    GRAM POSITIVE RODS IN BOTH AEROBIC AND ANAEROBIC BOTTLES CRITICAL RESULT CALLED TO, READ BACK BY AND VERIFIED WITH: RN PATTY DOWD ON 03/06/24 @ 1851 BY DRT    Culture (A)  Final    BACILLUS SPECIES Standardized susceptibility testing for this organism is not available. Performed at [facility], [address] [place], [place] [address]    Report Status 03/08/2024 FINAL  Final  Blood Culture (routine x 2)     Status: None   Collection Time: 03/06/24  1:57 AM   Specimen: BLOOD  Result Value Ref Range Status  Specimen Description   Final    BLOOD BLOOD LEFT FOREARM Performed at [facility], [address] [place], [place] [address]    Special Requests   Final    BOTTLES DRAWN AEROBIC AND ANAEROBIC Blood Culture adequate volume Performed at [facility], [address] [place], [place] [address]    Culture   Final    NO GROWTH 5 DAYS Performed at [facility], [address], [place], [place] [address]    Report Status 03/11/2024 FINAL  Final  Culture, blood  (Routine X 2) w Reflex to ID Panel     Status: None   Collection Time: 03/08/24 10:00 PM   Specimen: BLOOD  Result Value Ref Range Status   Specimen Description   Final    BLOOD RIGHT ANTECUBITAL Performed at [facility], [address] [place], [place] [address]    Special Requests   Final    BOTTLES DRAWN AEROBIC AND ANAEROBIC Blood Culture results may not be optimal due to an inadequate volume of blood received in culture bottles Performed at [facility], [address], [place], [place] [address]    Culture   Final    NO GROWTH 5 DAYS Performed at [facility], [address] [place], [place] [address]    Report Status 03/14/2024 FINAL  Final  Culture, blood (Routine X 2) w Reflex to ID Panel     Status: None   Collection Time: 03/09/24  3:55 AM   Specimen: BLOOD LEFT HAND  Result Value Ref Range Status   Specimen Description   Final    BLOOD LEFT HAND Performed at [facility], [address], [place], [place] [address]    Special Requests   Final    BOTTLES DRAWN AEROBIC AND ANAEROBIC Blood Culture results may not be optimal due to an inadequate volume of blood received in culture bottles Performed at [facility], [address] [place], [place] [address]    Culture   Final    NO GROWTH 5 DAYS Performed at [facility], [address], [place], [place] [address]    Report Status 03/14/2024 FINAL  Final    Time coordinating discharge: 45 minutes  Signed: Berdina Cheever  [facility] 03/14/2024, 10:01 AM

## 2024-03-14 NOTE — Progress Notes (Signed)
 Occupational Therapy Treatment Patient Details Name: Joseph Rangel MRN: [id number] DOB: 12/13/1945 Today's Date: 03/14/2024   History of present illness Joseph Rangel is a 78 y.o. male with medical history significant for metastatic prostate cancer on Zytiga , prednisone  and Lupron , HTN, arthritis, anemia, HLD, GERD, seizure disorder, chronic hypokalemia who was advised to present to the ED due to gram-positive rods from blood culture.  Per spouse, patient had blood transfusion on Saturday and felt weak right afterwards.  He continued to feel weak so they presented to the ER on Sunday.  Workup showed worsening hypokalemia, leukocytosis and stable anemia. He received potassium supplementation and blood cultures were obtained.  Per spouse, patient has been doing well over the last 2 days. They received a call to bring patient back to the hospital due to positive blood cultures. Pt admitted for bacteremia and hypokalemia.   OT comments  Patient seen this am for skilled OT with excellent participation in AM self care and mobility training. See below for current status. Patient continues to require acute OT to allow for discharge and continue to progress. Home with HHOT and family assist continues to be appropriate.       If plan is discharge home, recommend the following:  A lot of help with walking and/or transfers;A lot of help with bathing/dressing/bathroom;Assistance with cooking/housework;Direct supervision/assist for medications management;Direct supervision/assist for financial management;Assist for transportation;Help with stairs or ramp for entrance   Equipment Recommendations  None recommended by OT       Precautions / Restrictions Precautions Precautions: Fall Restrictions Weight Bearing Restrictions Per Provider Order: No       Mobility Bed Mobility Overal bed mobility: Needs Assistance Bed Mobility: Rolling, Supine to Sit Rolling: Supervision   Supine to sit: Supervision           Transfers Overall transfer level: Needs assistance Equipment used: Rolling walker (2 wheels) Transfers: Sit to/from Stand, Bed to chair/wheelchair/BSC Sit to Stand: Supervision     Step pivot transfers: Supervision           Balance Overall balance assessment: Needs assistance Sitting-balance support: Feet supported Sitting balance-Leahy Scale: Good     Standing balance support: Bilateral upper extremity supported Standing balance-Leahy Scale: Fair                             ADL either performed or assessed with clinical judgement   ADL Overall ADL's : Needs assistance/impaired Eating/Feeding: Independent;Sitting   Grooming: Wash/dry hands;Wash/dry face;Applying deodorant;Sitting;Modified independent   Upper Body Bathing: Modified independent;Sitting   Lower Body Bathing: Minimal assistance;Sit to/from stand   Upper Body Dressing : Supervision/safety;Sitting   Lower Body Dressing: Contact guard assist;Sit to/from stand   Toilet Transfer: Contact guard assist;Rolling walker (2 wheels)   Toileting- Clothing Manipulation and Hygiene: Contact guard assist Toileting - Clothing Manipulation Details (indicate cue type and reason): used urinal mod I     Functional mobility during ADLs: Supervision/safety General ADL Comments: full bathing and dressing at EOB    Extremity/Trunk Assessment Upper Extremity Assessment Upper Extremity Assessment: Right hand dominant;Overall WFL for tasks assessed   Lower Extremity Assessment Lower Extremity Assessment: Generalized weakness         Cognition Arousal: Alert Behavior During Therapy: WFL for tasks assessed/performed Cognition: No apparent impairments                               Following  commands: Intact        Cueing   Cueing Techniques: Verbal cues        General Comments no SOB this session    Pertinent Vitals/ Pain       Pain Assessment Pain Assessment: No/denies  pain   Frequency  Min 2X/week        Progress Toward Goals  OT Goals(current goals can now be found in the care plan section)  Progress towards OT goals: Progressing toward goals  Acute Rehab OT Goals Patient Stated Goal: to go home today OT Goal Formulation: With patient Time For Goal Achievement: 03/23/24 Potential to Achieve Goals: Good ADL Goals Pt Will Perform Lower Body Bathing: with contact guard assist;sit to/from stand Pt Will Perform Lower Body Dressing: with min assist;with adaptive equipment;sit to/from stand Pt Will Transfer to Toilet: with supervision;bedside commode;ambulating Pt Will Perform Toileting - Clothing Manipulation and hygiene: with contact guard assist;sit to/from stand Pt/caregiver will Perform Home Exercise Program: With written HEP provided;With Supervision;Increased strength;Both right and left upper extremity  Plan         AM-PAC OT 6 Clicks Daily Activity     Outcome Measure   Help from another person eating meals?: None Help from another person taking care of personal grooming?: None Help from another person toileting, which includes using toliet, bedpan, or urinal?: A Little Help from another person bathing (including washing, rinsing, drying)?: A Little Help from another person to put on and taking off regular upper body clothing?: None Help from another person to put on and taking off regular lower body clothing?: A Little 6 Click Score: 21    End of Session Equipment Utilized During Treatment: Rolling walker (2 wheels);Gait belt  OT Visit Diagnosis: Unsteadiness on feet (R26.81);Other abnormalities of gait and mobility (R26.89);Muscle weakness (generalized) (M62.81);Pain   Activity Tolerance Patient tolerated treatment well   Patient Left in chair;with call bell/phone within reach;with chair alarm set   Nurse Communication Mobility status        Time: 8895-8863 OT Time Calculation (min): 32 min  Charges: OT General  Charges $OT Visit: 1 Visit OT Treatments $Self Care/Home Management : 23-37 mins  Minna Dumire OT/L Acute Rehabilitation Department  [phone]  03/14/2024, 11:47 AM

## 2024-03-21 ENCOUNTER — Ambulatory Visit: Admitting: *Deleted

## 2024-03-21 VITALS — BP 132/75 | HR 86 | Temp 98.8°F | Resp 18 | Ht 69.0 in | Wt 148.9 lb

## 2024-03-21 DIAGNOSIS — D509 Iron deficiency anemia, unspecified: Secondary | ICD-10-CM

## 2024-03-21 MED ORDER — ACETAMINOPHEN 325 MG PO TABS: 650.0000 mg | ORAL_TABLET | Freq: Once | ORAL | Status: DC

## 2024-03-21 MED ORDER — IRON SUCROSE 20 MG/ML IV SOLN: 200.0000 mg | Freq: Once | INTRAVENOUS | Status: DC

## 2024-03-21 NOTE — Progress Notes (Signed)
 Unable to obtain IV access, has appt scheduled on Thursday

## 2024-03-23 ENCOUNTER — Ambulatory Visit

## 2024-03-23 DIAGNOSIS — D509 Iron deficiency anemia, unspecified: Secondary | ICD-10-CM

## 2024-03-23 MED ORDER — IRON SUCROSE 20 MG/ML IV SOLN: 200.0000 mg | Freq: Once | INTRAVENOUS | Status: DC

## 2024-03-23 MED ORDER — ACETAMINOPHEN 325 MG PO TABS: 650.0000 mg | ORAL_TABLET | Freq: Once | ORAL | Status: DC

## 2024-03-23 NOTE — Progress Notes (Signed)
 Unable to obtain IV access. Pt ok to go to hospital infusion center for doses.

## 2024-03-27 ENCOUNTER — Ambulatory Visit

## 2024-03-27 ENCOUNTER — Other Ambulatory Visit (HOSPITAL_COMMUNITY): Payer: Self-pay | Admitting: Hematology and Oncology

## 2024-03-27 ENCOUNTER — Encounter: Payer: Self-pay | Admitting: Hematology and Oncology

## 2024-03-27 DIAGNOSIS — D509 Iron deficiency anemia, unspecified: Secondary | ICD-10-CM

## 2024-03-27 DIAGNOSIS — D649 Anemia, unspecified: Secondary | ICD-10-CM

## 2024-03-29 ENCOUNTER — Ambulatory Visit

## 2024-03-30 ENCOUNTER — Other Ambulatory Visit: Payer: Self-pay

## 2024-03-30 ENCOUNTER — Emergency Department (HOSPITAL_COMMUNITY)
Admission: RE | Admit: 2024-03-30 | Discharge: 2024-03-30 | Disposition: A | Discharge status: Home / Self Care | Source: Ambulatory Visit | Attending: Hematology and Oncology | Admitting: Hematology and Oncology

## 2024-03-30 ENCOUNTER — Encounter (HOSPITAL_COMMUNITY): Payer: Self-pay

## 2024-03-30 ENCOUNTER — Emergency Department (HOSPITAL_COMMUNITY): Admission: EM | Admit: 2024-03-30 | Source: Home / Self Care

## 2024-03-30 DIAGNOSIS — D509 Iron deficiency anemia, unspecified: Secondary | ICD-10-CM | POA: Diagnosis present

## 2024-03-30 DIAGNOSIS — D649 Anemia, unspecified: Secondary | ICD-10-CM

## 2024-03-30 MED ORDER — IRON SUCROSE 200 MG IVPB - SIMPLE MED
200.0000 mg | Status: DC
  Administered 2024-03-30: 200 mg via INTRAVENOUS
  Filled 2024-03-30: qty 200

## 2024-03-30 MED ORDER — ACETAMINOPHEN 325 MG PO TABS
ORAL_TABLET | ORAL | Status: AC
  Filled 2024-03-30: qty 2

## 2024-03-30 MED ORDER — ACETAMINOPHEN 325 MG PO TABS
650.0000 mg | ORAL_TABLET | ORAL | Status: DC | PRN
  Administered 2024-03-30: 650 mg via ORAL

## 2024-03-30 NOTE — ED Notes (Signed)
 Kristin, R. RN from the infusion center called and informed ED staff that patient was supposed to be seen at the infusion clinic and not in the ED. This RN called daughter levert) and she said to proceed with taking the patient to the infusion clinic instead of the ED.

## 2024-03-30 NOTE — ED Triage Notes (Signed)
 PT arrives via POV. PT reports he needs an iron  transfusion. Daughter states he went to an infusion center but they could not get an IV. Denies any symptoms.

## 2024-03-31 ENCOUNTER — Ambulatory Visit

## 2024-04-03 ENCOUNTER — Encounter (HOSPITAL_COMMUNITY)
Admission: RE | Admit: 2024-04-03 | Discharge: 2024-04-03 | Disposition: A | Discharge status: Home / Self Care | Source: Ambulatory Visit | Attending: Hematology and Oncology | Admitting: Hematology and Oncology

## 2024-04-03 DIAGNOSIS — D649 Anemia, unspecified: Secondary | ICD-10-CM | POA: Diagnosis present

## 2024-04-03 DIAGNOSIS — D509 Iron deficiency anemia, unspecified: Secondary | ICD-10-CM | POA: Diagnosis present

## 2024-04-03 MED ORDER — IRON SUCROSE 200 MG IVPB - SIMPLE MED
200.0000 mg | Status: DC
  Administered 2024-04-03: 200 mg via INTRAVENOUS
  Filled 2024-04-03: qty 200

## 2024-04-03 MED ORDER — ACETAMINOPHEN 325 MG PO TABS
650.0000 mg | ORAL_TABLET | ORAL | Status: DC | PRN
  Administered 2024-04-03: 650 mg via ORAL

## 2024-04-03 MED ORDER — ACETAMINOPHEN 325 MG PO TABS
ORAL_TABLET | ORAL | Status: AC
  Filled 2024-04-03: qty 2

## 2024-04-04 ENCOUNTER — Encounter: Payer: Self-pay | Admitting: Hematology and Oncology

## 2024-04-05 ENCOUNTER — Encounter (HOSPITAL_COMMUNITY)

## 2024-04-07 ENCOUNTER — Encounter (HOSPITAL_COMMUNITY)
Admission: RE | Admit: 2024-04-07 | Discharge: 2024-04-07 | Disposition: A | Discharge status: Home / Self Care | Source: Ambulatory Visit | Attending: Hematology and Oncology | Admitting: Hematology and Oncology

## 2024-04-07 DIAGNOSIS — D509 Iron deficiency anemia, unspecified: Secondary | ICD-10-CM | POA: Diagnosis not present

## 2024-04-07 DIAGNOSIS — D649 Anemia, unspecified: Secondary | ICD-10-CM

## 2024-04-07 MED ORDER — IRON SUCROSE 200 MG IVPB - SIMPLE MED
200.0000 mg | Status: DC
  Administered 2024-04-07: 200 mg via INTRAVENOUS
  Filled 2024-04-07: qty 200

## 2024-04-09 ENCOUNTER — Other Ambulatory Visit: Payer: Self-pay | Admitting: Hematology and Oncology

## 2024-04-10 ENCOUNTER — Encounter (HOSPITAL_COMMUNITY)
Admission: RE | Admit: 2024-04-10 | Discharge: 2024-04-10 | Disposition: A | Discharge status: Home / Self Care | Source: Ambulatory Visit | Attending: Hematology and Oncology

## 2024-04-10 DIAGNOSIS — D649 Anemia, unspecified: Secondary | ICD-10-CM

## 2024-04-10 DIAGNOSIS — D509 Iron deficiency anemia, unspecified: Secondary | ICD-10-CM | POA: Diagnosis not present

## 2024-04-10 MED ORDER — IRON SUCROSE 200 MG IVPB - SIMPLE MED
200.0000 mg | Status: DC
  Administered 2024-04-10: 200 mg via INTRAVENOUS
  Filled 2024-04-10: qty 200

## 2024-04-12 ENCOUNTER — Encounter (HOSPITAL_COMMUNITY)

## 2024-04-18 ENCOUNTER — Encounter (HOSPITAL_COMMUNITY)
Admission: RE | Admit: 2024-04-18 | Discharge: 2024-04-18 | Disposition: A | Discharge status: Home / Self Care | Source: Ambulatory Visit | Attending: Hematology and Oncology | Admitting: Hematology and Oncology

## 2024-04-18 DIAGNOSIS — D649 Anemia, unspecified: Secondary | ICD-10-CM

## 2024-04-18 DIAGNOSIS — D509 Iron deficiency anemia, unspecified: Secondary | ICD-10-CM

## 2024-04-18 MED ORDER — IRON SUCROSE 200 MG IVPB - SIMPLE MED
200.0000 mg | Status: DC
  Administered 2024-04-18: 200 mg via INTRAVENOUS
  Filled 2024-04-18: qty 200

## 2024-05-30 ENCOUNTER — Other Ambulatory Visit: Payer: Self-pay

## 2024-05-30 DIAGNOSIS — D509 Iron deficiency anemia, unspecified: Secondary | ICD-10-CM

## 2024-05-30 DIAGNOSIS — C61 Malignant neoplasm of prostate: Secondary | ICD-10-CM

## 2024-05-31 ENCOUNTER — Inpatient Hospital Stay: Admitting: Hematology and Oncology

## 2024-05-31 ENCOUNTER — Inpatient Hospital Stay: Attending: Hematology and Oncology

## 2024-05-31 ENCOUNTER — Inpatient Hospital Stay

## 2024-05-31 DIAGNOSIS — C772 Secondary and unspecified malignant neoplasm of intra-abdominal lymph nodes: Secondary | ICD-10-CM | POA: Insufficient documentation

## 2024-05-31 DIAGNOSIS — F17210 Nicotine dependence, cigarettes, uncomplicated: Secondary | ICD-10-CM | POA: Insufficient documentation

## 2024-05-31 DIAGNOSIS — C61 Malignant neoplasm of prostate: Secondary | ICD-10-CM | POA: Insufficient documentation

## 2024-05-31 DIAGNOSIS — D649 Anemia, unspecified: Secondary | ICD-10-CM | POA: Insufficient documentation

## 2024-05-31 DIAGNOSIS — Z5111 Encounter for antineoplastic chemotherapy: Secondary | ICD-10-CM | POA: Insufficient documentation

## 2024-06-05 ENCOUNTER — Telehealth: Payer: Self-pay | Admitting: Hematology and Oncology

## 2024-06-05 NOTE — Telephone Encounter (Signed)
 Scheduled appointments per staff message. Called and left a VM foe the patients spouse Mrs. Neville with the appointment details for the patient. Will retry at a later time.

## 2024-06-08 ENCOUNTER — Telehealth: Payer: Self-pay | Admitting: *Deleted

## 2024-06-08 ENCOUNTER — Inpatient Hospital Stay

## 2024-06-08 ENCOUNTER — Inpatient Hospital Stay: Admitting: Adult Health

## 2024-06-08 NOTE — Telephone Encounter (Signed)
 Called pt to f/u about no showing for today's appt. Pt stated that he thought it was for the 28th of Sept. Advised pt to call and schedule appts when he is ready. Pt verbalized understanding

## 2024-06-16 ENCOUNTER — Inpatient Hospital Stay

## 2024-06-16 ENCOUNTER — Other Ambulatory Visit: Payer: Self-pay | Admitting: *Deleted

## 2024-06-16 ENCOUNTER — Inpatient Hospital Stay (HOSPITAL_BASED_OUTPATIENT_CLINIC_OR_DEPARTMENT_OTHER): Admitting: Physician Assistant

## 2024-06-16 ENCOUNTER — Ambulatory Visit: Payer: Self-pay | Admitting: Hematology and Oncology

## 2024-06-16 DIAGNOSIS — D509 Iron deficiency anemia, unspecified: Secondary | ICD-10-CM

## 2024-06-16 DIAGNOSIS — C772 Secondary and unspecified malignant neoplasm of intra-abdominal lymph nodes: Secondary | ICD-10-CM

## 2024-06-16 DIAGNOSIS — C61 Malignant neoplasm of prostate: Secondary | ICD-10-CM | POA: Diagnosis not present

## 2024-06-16 DIAGNOSIS — F17210 Nicotine dependence, cigarettes, uncomplicated: Secondary | ICD-10-CM | POA: Diagnosis not present

## 2024-06-16 DIAGNOSIS — Z5111 Encounter for antineoplastic chemotherapy: Secondary | ICD-10-CM | POA: Diagnosis present

## 2024-06-16 DIAGNOSIS — D649 Anemia, unspecified: Secondary | ICD-10-CM | POA: Diagnosis not present

## 2024-06-16 LAB — CMP (CANCER CENTER ONLY)
ALT: 6 U/L (ref 0–44)
AST: 12 U/L — ABNORMAL LOW (ref 15–41)
Albumin: 4.2 g/dL (ref 3.5–5.0)
Alkaline Phosphatase: 153 U/L — ABNORMAL HIGH (ref 38–126)
Anion gap: 9 (ref 5–15)
BUN: 18 mg/dL (ref 8–23)
CO2: 28 mmol/L (ref 22–32)
Calcium: 9.2 mg/dL (ref 8.9–10.3)
Chloride: 104 mmol/L (ref 98–111)
Creatinine: 1.12 mg/dL (ref 0.61–1.24)
GFR, Estimated: >60 mL/min (ref >60)
Glucose, Bld: 145 mg/dL — ABNORMAL HIGH (ref 70–99)
Potassium: 3.5 mmol/L (ref 3.5–5.1)
Sodium: 141 mmol/L (ref 135–145)
Total Bilirubin: 0.3 mg/dL (ref 0.0–1.2)
Total Protein: 7.7 g/dL (ref 6.5–8.1)

## 2024-06-16 LAB — CBC WITH DIFFERENTIAL (CANCER CENTER ONLY)
Abs Immature Granulocytes: 0.03 K/uL (ref 0.00–0.07)
Basophils Absolute: 0.1 K/uL (ref 0.0–0.1)
Basophils Relative: 1 %
Eosinophils Absolute: 0.2 K/uL (ref 0.0–0.5)
Eosinophils Relative: 3 %
HCT: 35.1 % — ABNORMAL LOW (ref 39.0–52.0)
Hemoglobin: 11.2 g/dL — ABNORMAL LOW (ref 13.0–17.0)
Immature Granulocytes: 0 %
Lymphocytes Relative: 15 %
Lymphs Abs: 1.3 K/uL (ref 0.7–4.0)
MCH: 28.5 pg (ref 26.0–34.0)
MCHC: 31.9 g/dL (ref 30.0–36.0)
MCV: 89.3 fL (ref 80.0–100.0)
Monocytes Absolute: 0.4 K/uL (ref 0.1–1.0)
Monocytes Relative: 4 %
Neutro Abs: 6.5 K/uL (ref 1.7–7.7)
Neutrophils Relative %: 77 %
Platelet Count: 211 K/uL (ref 150–400)
RBC: 3.93 MIL/uL — ABNORMAL LOW (ref 4.22–5.81)
RDW: 14.4 % (ref 11.5–15.5)
WBC Count: 8.5 K/uL (ref 4.0–10.5)
nRBC: 0 % (ref 0.0–0.2)

## 2024-06-16 MED ORDER — ABIRATERONE ACETATE 250 MG PO TABS: 250.0000 mg | ORAL_TABLET | Freq: Every day | ORAL | 2 refills | Status: DC

## 2024-06-16 MED ORDER — LEUPROLIDE ACETATE (3 MONTH) 22.5 MG ~~LOC~~ KIT
22.5000 mg | PACK | Freq: Once | SUBCUTANEOUS | Status: AC
  Administered 2024-06-16: 22.5 mg via SUBCUTANEOUS
  Filled 2024-06-16: qty 22.5

## 2024-06-16 NOTE — Progress Notes (Signed)
 Symptom Management Consult Note [facility]    Patient Care Team: Joseph Lung, MD as PCP - General (Family Medicine)    Name / MRN / DOB: Joseph Rangel  [id number]  06/05/1946   Date of visit: 06/16/2024   Chief Complaint/Reason for visit: routine follow up   Current Therapy: Lupron , zytegia and prednisone     ASSESSMENT AND PLAN Patient is a 78 y.o. male with oncologic history of metastatic prostate cancer w/ spread to intraabdominal lymph nodes followed by Joseph Rangel.  I have viewed most recent oncology note and lab work.  #Metastatic Prostate Cancer w/ Spread to Intraabdominal Lymph Nodes  - PSA is in process. Chart review shows PSA from 02/29/24 <0.02. - Refill request sent to MD for Zytiga .  - Received Lupron  injection today.  -Continue follow up every 12 weeks. Will send appointment request.  #Anemia - CBC today showing hemoglobin 11.2. No indication for transfusion at this time.  - Patient reporting black stool and is currently taking iron  supplements. He denies stool being tarry consistency. - ED precautions discussed should symptoms worsen.   HEME/ONC HISTORY Oncology History Overview Note  Joseph Rangel 78 y.o. male is here because of some abnormal lymphadenopathy identified on CT angiogram especially given his history of prostate cancer.  He had robotic-assisted laparoscopic radical retropubic prostatectomy with bilateral pelvic lymph node dissection for a T1c prostate adenocarcinoma. Final pathology from this surgery showed prostatic adenocarcinoma , gleasons score 3+4=7, involving both lobes, no evidence of angiolymphatic invasion, extraprostatic extension or seminal vesicle involvement. All resection margins are clear.  CT showed periaortic, left iliac, peri rectal LAD concerning for metastatic disease, and hence referred to Oncology.  He no showed to his first appointment given lack of transport, and came an hr late for his second appointment. He  is here with his wife, he tells me that some LN were enlarged on a CT scan and his doctor wants to make sure he doesn't have cancer again. He denies any changes to his pain, has chronic back pain and pain in his legs for which he takes pain medication and FU with his PCP and pain management. PSA of 207, PET imaging with adenopathy in the abdomen and pelvis in this patient with elevated PSA showing low level FDG uptake most suggestive of prostate metastases. Other disease processes that could have this distribution rectal cancer and lymphoma. The lack of pronounced FDG uptake in these lymph nodes and the reported PSA level make these less likely. Biopsy of RPLN confirmed prostate adenoca. He started taking casodex  and had his first lupron  on 10/18/2020 He started taking zytiga  but was only taking one tab a day, despite instructions to take 4 a day. Although there is some new literature suggesting to take one tab if you eat a fatty meal, it is not reliable if he would end up eating a fatty meal every day, and also give concomitant use of phenytoin  which is an inducer, in theory he may need more than 4 tabs a day. He is on Zytiga  1000 mg po daily with prednisone .   Prostate cancer metastatic to intraabdominal lymph node (HCC)  10/02/2020 Initial Diagnosis   Prostate cancer metastatic to intraabdominal lymph node (HCC)   10/02/2020 Cancer Staging   Staging form: Prostate, AJCC 8th Edition - Clinical: Stage IVB (cTX, cNX, pM1a, PSA: 207) - Signed by Joseph Ash, MD on 10/02/2020       INTERVAL HISTORY  Discussed the use of AI  scribe software for clinical note transcription with the patient, who gave verbal consent to proceed.    Joseph Rangel is a 78 y.o. male with oncologic history as above presenting to [facility] today with chief complaint of routine follow up. Patient presents unaccompanied to visit today.  Patient reports that he feels fine today.  He has no complaints.  He has black stool  still which she reported back in June.  Patient states that he is currently taking his iron  supplement.  Denies any abdominal pain.  Patient denies any chills, weakness fatigue or shortness of breath.  He is out of his Zytiga  medication and requesting a refill.  He does not need a refill on his prednisone  he reports.  He is agreeable to have his Lupron  injection today although he is not thrilled about it. Denis any edema.      ROS  All other systems are reviewed and are negative for acute change except as noted in the HPI.    No Known Allergies   Past Medical History:  Diagnosis Date   Arthritis    Cancer (HCC)    prostate    Frequency of urination    Hypertension    Seizures (HCC)    after brain surg - none in past 20 yrs   Speech disorder    due to brain surg     Past Surgical History:  Procedure Laterality Date   BRAIN SURGERY     BLOOD CLOT REMOVED 1980   MASS EXCISION  02/24/2012   Procedure: MINOR EXCISION OF MASS;  Surgeon: Joseph GORMAN Fragmin, MD;  Location: [facility];  Service: Urology;;  excision of neck lesion   ROBOT ASSISTED LAPAROSCOPIC RADICAL PROSTATECTOMY  02/24/2012   Procedure: ROBOTIC ASSISTED LAPAROSCOPIC RADICAL PROSTATECTOMY;  Surgeon: Joseph GORMAN Fragmin, MD;  Location: [facility];  Service: Urology;  Laterality: N/A;           Social History   Socioeconomic History   Marital status: Married    Spouse name: Not on file   Number of children: Not on file   Years of education: Not on file   Highest education level: Not on file  Occupational History   Not on file  Tobacco Use   Smoking status: Every Day    Types: Cigarettes   Smokeless tobacco: Never   Tobacco comments:    2 cigarettes per day  Vaping Use   Vaping status: Never Used  Substance and Sexual Activity   Alcohol  use: No   Drug use: No   Sexual activity: Not on file  Other Topics Concern   Not on file  Social History Narrative   Not on file   Social Drivers of Health   Financial  Resource Strain: Not on file  Food Insecurity: No Food Insecurity (03/08/2024)   Hunger Vital Sign    Worried About Running Out of Food in the Last Year: Never true    Ran Out of Food in the Last Year: Never true  Transportation Needs: No Transportation Needs (03/08/2024)   PRAPARE - Administrator, Civil Service (Medical): No    Lack of Transportation (Non-Medical): No  Physical Activity: Not on file  Stress: Not on file  Social Connections: Moderately Isolated (03/08/2024)   Social Connection and Isolation Panel    Frequency of Communication with Friends and Family: Once a week    Frequency of Social Gatherings with Friends and Family: More than three times a week    Attends  Religious Services: Never    Active Member of Clubs or Organizations: No    Attends Banker Meetings: Never    Marital Status: Married  Catering manager Violence: Not At Risk (03/08/2024)   Humiliation, Afraid, Rape, and Kick questionnaire    Fear of Current or Ex-Partner: No    Emotionally Abused: No    Physically Abused: No    Sexually Abused: No    No family history on file.   Current Outpatient Medications:    abiraterone  acetate (ZYTIGA ) 250 MG tablet, Take 1 tablet (250 mg total) by mouth daily. Take on an empty stomach 1 hour before or 2 hours after a meal, Disp: 120 tablet, Rfl: 2   acetaminophen  (TYLENOL ) 325 MG tablet, Take 2 tablets (650 mg total) by mouth every 6 (six) hours as needed for mild pain (or Fever >/= 101)., Disp: , Rfl:    atenolol  (TENORMIN ) 25 MG tablet, Take 25 mg by mouth daily., Disp: , Rfl:    buprenorphine  (BUTRANS ) 20 MCG/HR PTWK, Place 1 patch onto the skin once a week., Disp: 4 patch, Rfl: 0   cyanocobalamin  1000 MCG tablet, Take 1 tablet (1,000 mcg total) by mouth daily., Disp: , Rfl:    feeding supplement (ENSURE PLUS HIGH PROTEIN) LIQD, Take 237 mLs by mouth 2 (two) times daily between meals., Disp: , Rfl:    HYDROcodone -acetaminophen  (NORCO) 10-325  MG tablet, Take 1 tablet by mouth 4 (four) times daily as needed for severe pain (pain score 7-10)., Disp: 10 tablet, Rfl: 0   omeprazole (PRILOSEC) 40 MG capsule, Take 40 mg by mouth daily., Disp: , Rfl:    phenytoin  (DILANTIN ) 100 MG ER capsule, Take 300 mg by mouth at bedtime., Disp: , Rfl:    potassium chloride  SA (KLOR-CON  M) 20 MEQ tablet, Take 1 tablet (20 mEq total) by mouth daily. Can substitute to the packet, Disp: 20 tablet, Rfl: 0   predniSONE  (DELTASONE ) 5 MG tablet, Take 1 tablet (5 mg total) by mouth daily with breakfast., Disp: 90 tablet, Rfl: 3   senna-docusate (SENOKOT-S) 8.6-50 MG tablet, Take 1 tablet by mouth at bedtime as needed for mild constipation., Disp: , Rfl:    verapamil  (VERELAN  PM) 360 MG 24 hr capsule, Take 360 mg by mouth daily., Disp: , Rfl:   PHYSICAL EXAM ECOG FS:0 - Asymptomatic    Vitals:   06/16/24 1245 06/16/24 1253 06/16/24 1259  BP: (!) 175/99 (!) 172/98 (!) 159/91  Pulse: 78    Resp: 18    Temp: 97.7 F (36.5 C)    SpO2: 100%    Weight: 142 lb 14.4 oz (64.8 kg)     Physical Exam Vitals and nursing note reviewed.  Constitutional:      Appearance: He is not ill-appearing or toxic-appearing.     Comments: Sitting in wheelchair  HENT:     Head: Normocephalic.  Eyes:     Conjunctiva/sclera: Conjunctivae normal.  Cardiovascular:     Rate and Rhythm: Normal rate and regular rhythm.     Pulses: Normal pulses.     Heart sounds: Normal heart sounds.  Pulmonary:     Effort: Pulmonary effort is normal.     Breath sounds: Normal breath sounds.  Abdominal:     General: There is no distension.  Musculoskeletal:     Cervical back: Normal range of motion.     Right lower leg: No edema.     Left lower leg: No edema.  Skin:    General:  Skin is warm and dry.  Neurological:     Mental Status: He is alert.        LABORATORY DATA I have reviewed the data as listed    Latest Ref Rng & Units 06/16/2024   11:33 AM 03/14/2024    3:50 AM  03/13/2024    3:37 AM  CBC  WBC 4.0 - 10.5 K/uL 8.5  8.9  9.5   Hemoglobin 13.0 - 17.0 g/dL 88.7  7.1  7.1   Hematocrit 39.0 - 52.0 % 35.1  24.4  23.8   Platelets 150 - 400 K/uL 211  260  241         Latest Ref Rng & Units 06/16/2024   11:33 AM 03/14/2024    3:50 AM 03/13/2024    3:37 AM  CMP  Glucose 70 - 99 mg/dL 854  897  897   BUN 8 - 23 mg/dL 18  16  16    Creatinine 0.61 - 1.24 mg/dL 8.87  9.07  9.11   Sodium 135 - 145 mmol/L 141  135  139   Potassium 3.5 - 5.1 mmol/L 3.5  3.9  3.7   Chloride 98 - 111 mmol/L 104  104  110   CO2 22 - 32 mmol/L 28  21  21    Calcium 8.9 - 10.3 mg/dL 9.2  8.1  8.2   Total Protein 6.5 - 8.1 g/dL 7.7   6.2   Total Bilirubin 0.0 - 1.2 mg/dL 0.3   0.6   Alkaline Phos 38 - 126 U/L 153   83   AST 15 - 41 U/L 12   15   ALT 0 - 44 U/L 6   9        RADIOGRAPHIC STUDIES (from last 24 hours if applicable) I have personally reviewed the radiological images as listed and agreed with the findings in the report. No results found.      Visit Diagnosis: 1. Malignant neoplasm of prostate (HCC)   2. Secondary and unspecified malignant neoplasm of intra-abdominal lymph nodes (HCC)      No orders of the defined types were placed in this encounter.   All questions were answered. The patient knows to call the clinic with any problems, questions or concerns. No barriers to learning was detected.  A total of more than 30 minutes were spent on this encounter with face-to-face time and non-face-to-face time, including preparing to see the patient, ordering tests and/or medications, counseling the patient and coordination of care as outlined above.    Thank you for allowing me to participate in the care of this patient.    Chaynce Schafer E  Walisiewicz, PA-C Department of Hematology/Oncology [facility] Phone: [phone]  [phone]    06/16/2024 3:32 PM

## 2024-06-17 LAB — PSA, TOTAL AND FREE
PSA, Free Pct: UNDETERMINED %
PSA, Free: <0.02 ng/mL
Prostate Specific Ag, Serum: <0.1 ng/mL (ref 0.0–4.0)

## 2024-06-20 ENCOUNTER — Telehealth: Payer: Self-pay

## 2024-06-20 NOTE — Telephone Encounter (Signed)
 This RN called Mr. Joseph Rangel in regards to PSA results and per Mr. Joseph Rangel request spoke with daughter, Joseph Rangel. This RN made Pt's daughter aware that Pt's PSA level is unchanged at <0.2. This RN also educated Pt's daughter to reach out to Dr. Loretha if Pt develops bone pain, otherwise Pt's next appt with provider is in December. Pt's daughter repeated information provided, verbalized understanding, and was agreeable with plan.

## 2024-06-26 ENCOUNTER — Other Ambulatory Visit: Payer: Self-pay | Admitting: Hematology and Oncology

## 2024-06-26 DIAGNOSIS — C61 Malignant neoplasm of prostate: Secondary | ICD-10-CM

## 2024-06-26 DIAGNOSIS — C772 Secondary and unspecified malignant neoplasm of intra-abdominal lymph nodes: Secondary | ICD-10-CM

## 2024-07-24 ENCOUNTER — Encounter: Payer: Self-pay | Admitting: Radiology

## 2024-08-23 ENCOUNTER — Inpatient Hospital Stay: Attending: Hematology and Oncology

## 2024-08-23 VITALS — BP 135/94 | HR 84 | Resp 16

## 2024-08-23 DIAGNOSIS — Z79818 Long term (current) use of other agents affecting estrogen receptors and estrogen levels: Secondary | ICD-10-CM | POA: Insufficient documentation

## 2024-08-23 DIAGNOSIS — C61 Malignant neoplasm of prostate: Secondary | ICD-10-CM | POA: Insufficient documentation

## 2024-08-23 MED ORDER — LEUPROLIDE ACETATE (3 MONTH) 22.5 MG ~~LOC~~ KIT
22.5000 mg | PACK | Freq: Once | SUBCUTANEOUS | Status: AC
  Administered 2024-08-23: 22.5 mg via SUBCUTANEOUS
  Filled 2024-08-23: qty 22.5

## 2024-09-18 ENCOUNTER — Other Ambulatory Visit: Payer: Self-pay | Admitting: *Deleted

## 2024-09-18 DIAGNOSIS — C61 Malignant neoplasm of prostate: Secondary | ICD-10-CM

## 2024-09-19 ENCOUNTER — Other Ambulatory Visit

## 2024-09-19 ENCOUNTER — Ambulatory Visit: Admitting: Hematology and Oncology

## 2024-10-02 ENCOUNTER — Telehealth: Payer: Self-pay | Admitting: *Deleted

## 2024-10-02 NOTE — Telephone Encounter (Signed)
 Joseph Rangel daughter [name] @ [phone] called to reschedule his appts for lab and with Dr. Loretha that he did not come to on 09/19/25. He has an injection appt currently scheduled on 11/15/24.  She also asked for a refill for Abiraterone . Refill request routed to Dr. Loretha. Schedule message sent

## 2024-10-03 ENCOUNTER — Telehealth: Payer: Self-pay

## 2024-10-03 ENCOUNTER — Encounter: Payer: Self-pay | Admitting: Hematology and Oncology

## 2024-10-03 NOTE — Telephone Encounter (Signed)
 Pt is in need of Zytiga  refill, however he has been a no show to several visits, and per MD, he needs a follow up with labs since he has not had labs in 3 mos. Pt was scheduled for February but needs to be seen sooner d/t need for medication. Pt has been scheduled for 1/16 with labs, md and injection. S/w pt's Daughter Rutherford who confirmed date and time.

## 2024-10-06 ENCOUNTER — Other Ambulatory Visit (HOSPITAL_COMMUNITY): Payer: Self-pay

## 2024-10-06 ENCOUNTER — Encounter: Payer: Self-pay | Admitting: Hematology and Oncology

## 2024-10-06 ENCOUNTER — Inpatient Hospital Stay: Attending: Hematology and Oncology

## 2024-10-06 ENCOUNTER — Inpatient Hospital Stay: Admitting: Hematology and Oncology

## 2024-10-06 ENCOUNTER — Inpatient Hospital Stay

## 2024-10-06 DIAGNOSIS — C772 Secondary and unspecified malignant neoplasm of intra-abdominal lymph nodes: Secondary | ICD-10-CM | POA: Diagnosis not present

## 2024-10-06 DIAGNOSIS — D649 Anemia, unspecified: Secondary | ICD-10-CM

## 2024-10-06 DIAGNOSIS — E876 Hypokalemia: Secondary | ICD-10-CM | POA: Diagnosis not present

## 2024-10-06 DIAGNOSIS — M1990 Unspecified osteoarthritis, unspecified site: Secondary | ICD-10-CM | POA: Diagnosis not present

## 2024-10-06 DIAGNOSIS — Z79899 Other long term (current) drug therapy: Secondary | ICD-10-CM | POA: Diagnosis not present

## 2024-10-06 DIAGNOSIS — C61 Malignant neoplasm of prostate: Secondary | ICD-10-CM | POA: Diagnosis not present

## 2024-10-06 DIAGNOSIS — I10 Essential (primary) hypertension: Secondary | ICD-10-CM | POA: Diagnosis not present

## 2024-10-06 DIAGNOSIS — D509 Iron deficiency anemia, unspecified: Secondary | ICD-10-CM

## 2024-10-06 DIAGNOSIS — Z7952 Long term (current) use of systemic steroids: Secondary | ICD-10-CM | POA: Insufficient documentation

## 2024-10-06 DIAGNOSIS — F17210 Nicotine dependence, cigarettes, uncomplicated: Secondary | ICD-10-CM | POA: Insufficient documentation

## 2024-10-06 LAB — CBC WITH DIFFERENTIAL (CANCER CENTER ONLY)
Abs Immature Granulocytes: 0.05 K/uL (ref 0.00–0.07)
Basophils Absolute: 0.1 K/uL (ref 0.0–0.1)
Basophils Relative: 1 %
Eosinophils Absolute: 0.1 K/uL (ref 0.0–0.5)
Eosinophils Relative: 1 %
HCT: 31.2 % — ABNORMAL LOW (ref 39.0–52.0)
Hemoglobin: 10.4 g/dL — ABNORMAL LOW (ref 13.0–17.0)
Immature Granulocytes: 1 %
Lymphocytes Relative: 15 %
Lymphs Abs: 1.5 K/uL (ref 0.7–4.0)
MCH: 29.5 pg (ref 26.0–34.0)
MCHC: 33.3 g/dL (ref 30.0–36.0)
MCV: 88.6 fL (ref 80.0–100.0)
Monocytes Absolute: 0.6 K/uL (ref 0.1–1.0)
Monocytes Relative: 6 %
Neutro Abs: 7.2 K/uL (ref 1.7–7.7)
Neutrophils Relative %: 76 %
Platelet Count: 260 K/uL (ref 150–400)
RBC: 3.52 MIL/uL — ABNORMAL LOW (ref 4.22–5.81)
RDW: 14 % (ref 11.5–15.5)
WBC Count: 9.5 K/uL (ref 4.0–10.5)
nRBC: 0 % (ref 0.0–0.2)

## 2024-10-06 LAB — CMP (CANCER CENTER ONLY)
ALT: <5 U/L (ref 0–44)
AST: 20 U/L (ref 15–41)
Albumin: 4.2 g/dL (ref 3.5–5.0)
Alkaline Phosphatase: 174 U/L — ABNORMAL HIGH (ref 38–126)
Anion gap: 13 (ref 5–15)
BUN: 18 mg/dL (ref 8–23)
CO2: 26 mmol/L (ref 22–32)
Calcium: 9 mg/dL (ref 8.9–10.3)
Chloride: 101 mmol/L (ref 98–111)
Creatinine: 1.08 mg/dL (ref 0.61–1.24)
GFR, Estimated: >60 mL/min
Glucose, Bld: 145 mg/dL — ABNORMAL HIGH (ref 70–99)
Potassium: 2.9 mmol/L — ABNORMAL LOW (ref 3.5–5.1)
Sodium: 141 mmol/L (ref 135–145)
Total Bilirubin: 0.3 mg/dL (ref 0.0–1.2)
Total Protein: 7.7 g/dL (ref 6.5–8.1)

## 2024-10-06 LAB — SAMPLE TO BLOOD BANK

## 2024-10-06 LAB — PSA: Prostatic Specific Antigen: <0.02 ng/mL (ref 0.00–4.00)

## 2024-10-06 MED ORDER — POTASSIUM CHLORIDE CRYS ER 20 MEQ PO TBCR
20.0000 meq | EXTENDED_RELEASE_TABLET | Freq: Every day | ORAL | 0 refills | Status: AC
  Filled 2024-10-06: qty 60, 60d supply, fill #0

## 2024-10-06 MED ORDER — ABIRATERONE ACETATE 250 MG PO TABS
1000.0000 mg | ORAL_TABLET | Freq: Every day | ORAL | 2 refills | Status: AC
  Filled 2024-10-10: qty 120, 30d supply, fill #0

## 2024-10-06 MED ORDER — POTASSIUM CHLORIDE CRYS ER 20 MEQ PO TBCR
20.0000 meq | EXTENDED_RELEASE_TABLET | Freq: Every day | ORAL | 0 refills | Status: DC
  Filled 2024-10-06: qty 20, 20d supply, fill #0

## 2024-10-06 MED ORDER — PREDNISONE 5 MG PO TABS
5.0000 mg | ORAL_TABLET | Freq: Every day | ORAL | 3 refills | Status: AC
  Filled 2024-10-06: qty 90, 90d supply, fill #0

## 2024-10-06 NOTE — Progress Notes (Signed)
 " [facility] [phone]   [phone]  PROGRESS NOTE  Patient Care Team: Leigh Lung, MD as PCP - General (Family Medicine)  Hematological/Oncological History  # Metastatic Prostate Cancer w/ Spread to Intraabdominal Lymph Nodes --previously followed by Dr. Loretha. Currently on abiraterone  therapy  Interval History:  Discussed the use of AI scribe software for clinical note transcription with the patient, who gave verbal consent to proceed.  History of Present Illness   History of Present Illness  Joseph Rangel is a 79 year old male with metastatic prostate cancer who presents for oncology follow-up and medication refills after missing several appointments.  He has missed three recent oncology appointments due to lack of communication from his daughter, who manages his transportation and medication logistics. As a result, he has not had access to his prescribed medications, including abiraterone , prednisone , and potassium supplementation, for the past week. He has received his scheduled injection since December 2025, with the next dose due in March 2026.  He denies new or worsening bone pain. He describes chronic joint immobility and difficulty moving, but does not report any new symptoms attributable to his malignancy. He denies new shortness of breath or other acute complaints.  He relies on his daughter for transportation and medication management.  Rest of the pertinent 10 point ROS reviewed and negative  MEDICAL HISTORY:  Past Medical History:  Diagnosis Date   Arthritis    Cancer (HCC)    prostate    Frequency of urination    Hypertension    Seizures (HCC)    after brain surg - none in past 20 yrs   Speech disorder    due to brain surg    SURGICAL HISTORY: Past Surgical History:  Procedure Laterality Date   BRAIN SURGERY     BLOOD CLOT REMOVED 1980   MASS EXCISION  02/24/2012   Procedure: MINOR EXCISION OF MASS;  Surgeon: Alm GORMAN Fragmin, MD;  Location: [facility];  Service: Urology;;  excision of neck lesion   ROBOT ASSISTED LAPAROSCOPIC RADICAL PROSTATECTOMY  02/24/2012   Procedure: ROBOTIC ASSISTED LAPAROSCOPIC RADICAL PROSTATECTOMY;  Surgeon: Alm GORMAN Fragmin, MD;  Location: [facility];  Service: Urology;  Laterality: N/A;           SOCIAL HISTORY: Social History   Socioeconomic History   Marital status: Married    Spouse name: Not on file   Number of children: Not on file   Years of education: Not on file   Highest education level: Not on file  Occupational History   Not on file  Tobacco Use   Smoking status: Every Day    Types: Cigarettes   Smokeless tobacco: Never   Tobacco comments:    2 cigarettes per day  Vaping Use   Vaping status: Never Used  Substance and Sexual Activity   Alcohol  use: No   Drug use: No   Sexual activity: Not on file  Other Topics Concern   Not on file  Social History Narrative   Not on file   Social Drivers of Health   Tobacco Use: High Risk (03/30/2024)   Patient History    Smoking Tobacco Use: Every Day    Smokeless Tobacco Use: Never    Passive Exposure: Not on file  Financial Resource Strain: Not on file  Food Insecurity: No Food Insecurity (03/08/2024)   Epic    Worried About Programme Researcher, Broadcasting/film/video in the Last Year: Never true    [employer] of Food in  the Last Year: Never true  Transportation Needs: No Transportation Needs (03/08/2024)   Epic    Lack of Transportation (Medical): No    Lack of Transportation (Non-Medical): No  Physical Activity: Not on file  Stress: Not on file  Social Connections: Moderately Isolated (03/08/2024)   Social Connection and Isolation Panel    Frequency of Communication with Friends and Family: Once a week    Frequency of Social Gatherings with Friends and Family: More than three times a week    Attends Religious Services: Never    Database Administrator or Organizations: No    Attends Banker Meetings: Never    Marital  Status: Married  Catering Manager Violence: Not At Risk (03/08/2024)   Epic    Fear of Current or Ex-Partner: No    Emotionally Abused: No    Physically Abused: No    Sexually Abused: No  Depression (PHQ2-9): Low Risk (06/16/2024)   Depression (PHQ2-9)    PHQ-2 Score: 0  Alcohol  Screen: Not on file  Housing: Low Risk (03/08/2024)   Epic    Unable to Pay for Housing in the Last Year: No    Number of Times Moved in the Last Year: 0    Homeless in the Last Year: No  Utilities: Not At Risk (03/08/2024)   Epic    Threatened with loss of utilities: No  Health Literacy: Not on file    FAMILY HISTORY: No family history on file.  ALLERGIES:  has no known allergies.  MEDICATIONS:  Current Outpatient Medications  Medication Sig Dispense Refill   acetaminophen  (TYLENOL ) 325 MG tablet Take 2 tablets (650 mg total) by mouth every 6 (six) hours as needed for mild pain (or Fever >/= 101).     atenolol  (TENORMIN ) 25 MG tablet Take 25 mg by mouth daily.     buprenorphine  (BUTRANS ) 20 MCG/HR PTWK Place 1 patch onto the skin once a week. 4 patch 0   cyanocobalamin  1000 MCG tablet Take 1 tablet (1,000 mcg total) by mouth daily.     feeding supplement (ENSURE PLUS HIGH PROTEIN) LIQD Take 237 mLs by mouth 2 (two) times daily between meals.     HYDROcodone -acetaminophen  (NORCO) 10-325 MG tablet Take 1 tablet by mouth 4 (four) times daily as needed for severe pain (pain score 7-10). 10 tablet 0   omeprazole (PRILOSEC) 40 MG capsule Take 40 mg by mouth daily.     phenytoin  (DILANTIN ) 100 MG ER capsule Take 300 mg by mouth at bedtime.     senna-docusate (SENOKOT-S) 8.6-50 MG tablet Take 1 tablet by mouth at bedtime as needed for mild constipation.     verapamil  (VERELAN  PM) 360 MG 24 hr capsule Take 360 mg by mouth daily.     abiraterone  acetate (ZYTIGA ) 250 MG tablet Take 4 tablets (1,000 mg total) by mouth daily. Take on an empty stomach 1 hour before or 2 hours after a meal 120 tablet 2   potassium  chloride SA (KLOR-CON  M) 20 MEQ tablet Take 1 tablet (20 mEq total) by mouth daily. Can substitute to the packet 60 tablet 0   predniSONE  (DELTASONE ) 5 MG tablet Take 1 tablet (5 mg total) by mouth daily with breakfast. 90 tablet 3   No current facility-administered medications for this visit.    REVIEW OF SYSTEMS:   Constitutional: ( - ) fevers, ( - )  chills , ( - ) night sweats Eyes: ( - ) blurriness of vision, ( - ) double  vision, ( - ) watery eyes Ears, nose, mouth, throat, and face: ( - ) mucositis, ( - ) sore throat Respiratory: ( - ) cough, ( - ) dyspnea, ( - ) wheezes Cardiovascular: ( - ) palpitation, ( - ) chest discomfort, ( - ) lower extremity swelling Gastrointestinal:  ( - ) nausea, ( - ) heartburn, ( - ) change in bowel habits Skin: ( - ) abnormal skin rashes Lymphatics: ( - ) new lymphadenopathy, ( - ) easy bruising Neurological: ( - ) numbness, ( - ) tingling, ( - ) new weaknesses Behavioral/Psych: ( - ) mood change, ( - ) new changes  All other systems were reviewed with the patient and are negative.  PHYSICAL EXAMINATION: ECOG PERFORMANCE STATUS: 2 - Symptomatic, <50% confined to bed  Vitals:   10/06/24 1426  BP: (!) 146/93  Pulse: 81  Resp: 18  Temp: (!) 97.4 F (36.3 C)  SpO2: 99%      Filed Weights   10/06/24 1426  Weight: 164 lb 12.8 oz (74.8 kg)     Physical Exam Constitutional:      General: He is not in acute distress.    Appearance: Normal appearance.     Comments: Came with a walker   HENT:     Head: Normocephalic and atraumatic.  Cardiovascular:     Rate and Rhythm: Normal rate and regular rhythm.     Pulses: Normal pulses.     Heart sounds: Normal heart sounds.  Pulmonary:     Effort: Pulmonary effort is normal.     Breath sounds: Normal breath sounds. No wheezing.  Musculoskeletal:        General: No swelling or tenderness.  Neurological:     General: No focal deficit present.     Mental Status: He is alert.  Psychiatric:         Mood and Affect: Mood normal.      LABORATORY DATA:  I have reviewed the data as listed    Latest Ref Rng & Units 10/06/2024    1:16 PM 06/16/2024   11:33 AM 03/14/2024    3:50 AM  CBC  WBC 4.0 - 10.5 K/uL 9.5  8.5  8.9   Hemoglobin 13.0 - 17.0 g/dL 89.5  88.7  7.1   Hematocrit 39.0 - 52.0 % 31.2  35.1  24.4   Platelets 150 - 400 K/uL 260  211  260        Latest Ref Rng & Units 10/06/2024    1:16 PM 06/16/2024   11:33 AM 03/14/2024    3:50 AM  CMP  Glucose 70 - 99 mg/dL 854  854  897   BUN 8 - 23 mg/dL 18  18  16    Creatinine 0.61 - 1.24 mg/dL 8.91  8.87  9.07   Sodium 135 - 145 mmol/L 141  141  135   Potassium 3.5 - 5.1 mmol/L 2.9  3.5  3.9   Chloride 98 - 111 mmol/L 101  104  104   CO2 22 - 32 mmol/L 26  28  21    Calcium 8.9 - 10.3 mg/dL 9.0  9.2  8.1   Total Protein 6.5 - 8.1 g/dL 7.7  7.7    Total Bilirubin 0.0 - 1.2 mg/dL 0.3  0.3    Alkaline Phos 38 - 126 U/L 174  153    AST 15 - 41 U/L 20  12    ALT 0 - 44 U/L <5  6  RADIOGRAPHIC STUDIES: No results found.   ASSESSMENT & PLAN Camdon Saetern 79 y.o. male with medical history significant for metastatic prostate cancer who presents for a follow up visit.  PMH significant for prostate adenocarcinoma Gleason 3+4, no LN involvement s.p robotic prostatectomy in 2013, presented with peri aortic left iliac and peri rectal LN, with biopsy confirming metastatic prostate adenoca now on zytiga  and lupron  presents here for FU on his metastatic prostate cancer. Baseline PSA of 207.  Assessment and Plan Assessment & Plan # Metastatic Castrate Sensitive Prostate Cancer -- continue on Zytiga  1000mg  PO daily with prednisone  -- Lupron  every 3 months, dose planned in March -- PSA from today is pending, last PSA without any concerns for recurrence. ---He should continue on Zytiga  with prednisone  and lupron , follow up every 12 weeks. -- He did not have any bone metastasis hence he is not on bisphosphonate therapy.     Hypokalemia Not taking potassium supplementation for a week. Supplementation necessary due to abiraterone  and prednisone  therapy. - Refilled potassium supplement prescription. - Instructed him to take potassium daily as prescribed. - Arranged to call his daughter Rutherford to explain medication instructions and ensure adherence.  No orders of the defined types were placed in this encounter.   All questions were answered. The patient knows to call the clinic with any problems, questions or concerns.  A total of more than 30 minutes were spent on this encounter with face-to-face time and non-face-to-face time, including preparing to see the patient, ordering tests and/or medications, counseling the patient and coordination of care as outlined above.   Amber Stalls MD   10/06/2024 4:17 PM  "

## 2024-10-09 ENCOUNTER — Other Ambulatory Visit: Payer: Self-pay | Admitting: Pharmacist

## 2024-10-09 ENCOUNTER — Telehealth: Payer: Self-pay | Admitting: Pharmacist

## 2024-10-09 ENCOUNTER — Telehealth: Payer: Self-pay

## 2024-10-09 ENCOUNTER — Other Ambulatory Visit (HOSPITAL_COMMUNITY): Payer: Self-pay

## 2024-10-09 NOTE — Telephone Encounter (Signed)
 Oral Oncology Patient Advocate Encounter   Received notification that prior authorization for Zytiga  is required.   PA submitted on 10/09/2024 Key [id number] Status: pending      Charlott Hamilton,  CPhT-Adv  she/her/hers [facility]  [facility] Pharmacy Technician Patient Advocate Specialist III [facility] Phone: [phone]  Fax: [phone] [email or web address]

## 2024-10-09 NOTE — Telephone Encounter (Signed)
 [facility]        Telephone: [phone]?Fax: [phone]   Oncology Clinical Pharmacist Practitioner Encounter   Received new prescription for abiraterone  for the treatment of prostate cancer. This is being given in combination with leuprolide  and prednisone . It is planned to continue until disease progression or unacceptable toxicity.  Labs from 10/06/24 assessed. Prescription dose and frequency assessed.   Patient has been on abiraterone  and this is continuation of care. Dr. Loretha addressed the possible interaction with phenytoin  on 12/04/20 and was okay continuing dose at that time at his current dose which is 1000 mg PO daily.  Patient initially counseled on 10/02/20 by oral chemotherapy clinic.  Current medication list in Epic reviewed. Significant DDIs with abiraterone  identified:Yes. As above, Dr. Loretha aware of potential interaction with phenytoin  and abiraterone . Patient has been maintained on current dose of abiraterone  since 2022.  Evaluated chart. Patient barriers to medication adherence identified: No.  Prescription has been e-scribed to the [facility]) for benefits analysis and approval.  Oral Oncology Clinic will continue to follow for insurance authorization, copayment issues, initial counseling and start date.  As above, initial education done on 10/02/20.  Frederica Chrestman A. Lucila, PharmD, BCOP, CPP Hematology-Oncology Clinical Pharmacist Practitioner  10/09/2024 2:12 PM  **Disclaimer: This note was dictated with voice recognition software. Similar sounding words can inadvertently be transcribed and this note may contain transcription errors which may not have been corrected upon publication of note.**

## 2024-10-09 NOTE — Telephone Encounter (Signed)
 Oral Oncology Patient Advocate Encounter  Prior Authorization for Zytiga  has been approved.    PA# [id number] Effective dates: 09/21/2024 through 09/20/2025  Patients co-pay is $5.10.     Charlott Hamilton,  CPhT-Adv  she/her/hers [facility]  [facility] Pharmacy Technician Patient Advocate Specialist III [facility] Phone: [phone]  Fax: [phone] [email or web address]

## 2024-10-10 ENCOUNTER — Other Ambulatory Visit: Payer: Self-pay

## 2024-10-10 ENCOUNTER — Other Ambulatory Visit (HOSPITAL_COMMUNITY): Payer: Self-pay

## 2024-10-10 NOTE — Progress Notes (Signed)
 Specialty Pharmacy Initial Fill Coordination Note  Joseph Rangel is a 79 y.o. male contacted today regarding refills of specialty medication(s) Abiraterone  Acetate (ZYTIGA ) .  Patient requested Delivery  on 10/11/24  to verified address [address] [place] [address]   Medication will be filled on 10/10/2024.   Patient is aware of $5.10 copayment.

## 2024-10-10 NOTE — Progress Notes (Signed)
 Specialty Pharmacy Initiation Note   Joseph Rangel is a 79 y.o. male who will be followed by the specialty pharmacy service for RxSp Oncology    Review of administration, indication, effectiveness, safety, potential side effects, storage/disposable, and missed dose instructions occurred on initial assessment done 10/02/20 for patient's specialty medication(s) Abiraterone  Acetate (ZYTIGA )  This is continuation of care and new PA was required.   Patient/Caregiver did not have any additional questions or concerns.   Patient's therapy is appropriate to: Continue    Goals Addressed             This Visit's Progress    Maintain optimal adherence to therapy       Patient is on track. Patient will maintain adherence         Norleen DELENA Parkinson Specialty Pharmacist

## 2024-11-02 ENCOUNTER — Inpatient Hospital Stay

## 2024-11-02 ENCOUNTER — Inpatient Hospital Stay: Admitting: Hematology and Oncology

## 2024-11-15 ENCOUNTER — Inpatient Hospital Stay

## 2024-12-01 ENCOUNTER — Inpatient Hospital Stay

## 2024-12-01 ENCOUNTER — Inpatient Hospital Stay: Admitting: Hematology and Oncology
